# Patient Record
Sex: Female | Born: 1970 | Race: Black or African American | Hispanic: No | Marital: Single | Smoking: Never smoker
Health system: Southern US, Community
[De-identification: ages and names within clinical notes are randomized; demographics above are authoritative.]

## PROBLEM LIST (undated history)

## (undated) ENCOUNTER — Emergency Department (HOSPITAL_COMMUNITY): Payer: Self-pay

## (undated) ENCOUNTER — Emergency Department (HOSPITAL_BASED_OUTPATIENT_CLINIC_OR_DEPARTMENT_OTHER): Admission: EM | Payer: Medicare Other | Source: Home / Self Care

## (undated) DIAGNOSIS — Z86711 Personal history of pulmonary embolism: Secondary | ICD-10-CM

## (undated) DIAGNOSIS — E785 Hyperlipidemia, unspecified: Secondary | ICD-10-CM

## (undated) DIAGNOSIS — F209 Schizophrenia, unspecified: Secondary | ICD-10-CM

## (undated) DIAGNOSIS — E119 Type 2 diabetes mellitus without complications: Secondary | ICD-10-CM

## (undated) DIAGNOSIS — J45909 Unspecified asthma, uncomplicated: Secondary | ICD-10-CM

## (undated) DIAGNOSIS — K219 Gastro-esophageal reflux disease without esophagitis: Secondary | ICD-10-CM

## (undated) HISTORY — DX: Gastro-esophageal reflux disease without esophagitis: K21.9

## (undated) HISTORY — DX: Personal history of pulmonary embolism: Z86.711

## (undated) HISTORY — PX: BREAST SURGERY: SHX581

## (undated) HISTORY — DX: Hyperlipidemia, unspecified: E78.5

## (undated) HISTORY — DX: Unspecified asthma, uncomplicated: J45.909

---

## 1997-06-04 ENCOUNTER — Emergency Department (HOSPITAL_COMMUNITY): Admission: EM | Admit: 1997-06-04 | Discharge: 1997-06-04 | Payer: Self-pay | Admitting: Emergency Medicine

## 1998-08-08 ENCOUNTER — Inpatient Hospital Stay (HOSPITAL_COMMUNITY): Admission: EM | Admit: 1998-08-08 | Discharge: 1998-08-12 | Payer: Self-pay | Admitting: Emergency Medicine

## 1998-12-09 ENCOUNTER — Other Ambulatory Visit: Admission: RE | Admit: 1998-12-09 | Discharge: 1998-12-09 | Payer: Self-pay | Admitting: Family Medicine

## 1998-12-22 ENCOUNTER — Encounter: Payer: Self-pay | Admitting: Family Medicine

## 1998-12-22 ENCOUNTER — Ambulatory Visit (HOSPITAL_COMMUNITY): Admission: RE | Admit: 1998-12-22 | Discharge: 1998-12-22 | Payer: Self-pay | Admitting: Family Medicine

## 1999-01-17 ENCOUNTER — Emergency Department (HOSPITAL_COMMUNITY): Admission: EM | Admit: 1999-01-17 | Discharge: 1999-01-17 | Payer: Self-pay | Admitting: Emergency Medicine

## 1999-01-22 ENCOUNTER — Inpatient Hospital Stay (HOSPITAL_COMMUNITY): Admission: EM | Admit: 1999-01-22 | Discharge: 1999-01-29 | Payer: Self-pay | Admitting: Emergency Medicine

## 1999-01-22 ENCOUNTER — Encounter: Payer: Self-pay | Admitting: Emergency Medicine

## 1999-01-22 ENCOUNTER — Emergency Department (HOSPITAL_COMMUNITY): Admission: EM | Admit: 1999-01-22 | Discharge: 1999-01-22 | Payer: Self-pay | Admitting: Emergency Medicine

## 1999-01-24 ENCOUNTER — Encounter: Payer: Self-pay | Admitting: Family Medicine

## 1999-02-10 ENCOUNTER — Encounter: Admission: RE | Admit: 1999-02-10 | Discharge: 1999-05-11 | Payer: Self-pay | Admitting: Family Medicine

## 2000-01-05 ENCOUNTER — Ambulatory Visit (HOSPITAL_COMMUNITY): Admission: RE | Admit: 2000-01-05 | Discharge: 2000-01-05 | Payer: Self-pay

## 2000-10-12 ENCOUNTER — Other Ambulatory Visit: Admission: RE | Admit: 2000-10-12 | Discharge: 2000-10-12 | Payer: Self-pay | Admitting: Obstetrics and Gynecology

## 2001-11-11 ENCOUNTER — Encounter: Admission: RE | Admit: 2001-11-11 | Discharge: 2001-11-11 | Payer: Self-pay | Admitting: Obstetrics and Gynecology

## 2001-11-11 ENCOUNTER — Encounter: Payer: Self-pay | Admitting: Obstetrics and Gynecology

## 2001-12-11 ENCOUNTER — Ambulatory Visit (HOSPITAL_BASED_OUTPATIENT_CLINIC_OR_DEPARTMENT_OTHER): Admission: RE | Admit: 2001-12-11 | Discharge: 2001-12-11 | Payer: Self-pay | Admitting: General Surgery

## 2001-12-15 ENCOUNTER — Emergency Department (HOSPITAL_COMMUNITY): Admission: EM | Admit: 2001-12-15 | Discharge: 2001-12-15 | Payer: Self-pay | Admitting: Emergency Medicine

## 2002-07-15 ENCOUNTER — Other Ambulatory Visit: Admission: RE | Admit: 2002-07-15 | Discharge: 2002-07-15 | Payer: Self-pay | Admitting: Obstetrics and Gynecology

## 2003-03-24 ENCOUNTER — Encounter: Admission: RE | Admit: 2003-03-24 | Discharge: 2003-03-24 | Payer: Self-pay | Admitting: Internal Medicine

## 2003-07-08 ENCOUNTER — Ambulatory Visit (HOSPITAL_COMMUNITY): Admission: RE | Admit: 2003-07-08 | Discharge: 2003-07-08 | Payer: Self-pay | Admitting: General Surgery

## 2003-07-08 ENCOUNTER — Ambulatory Visit (HOSPITAL_BASED_OUTPATIENT_CLINIC_OR_DEPARTMENT_OTHER): Admission: RE | Admit: 2003-07-08 | Discharge: 2003-07-08 | Payer: Self-pay | Admitting: General Surgery

## 2003-08-03 ENCOUNTER — Other Ambulatory Visit: Admission: RE | Admit: 2003-08-03 | Discharge: 2003-08-03 | Payer: Self-pay | Admitting: Obstetrics and Gynecology

## 2003-09-02 ENCOUNTER — Inpatient Hospital Stay (HOSPITAL_COMMUNITY): Admission: AD | Admit: 2003-09-02 | Discharge: 2003-09-14 | Payer: Self-pay | Admitting: Psychiatry

## 2003-09-21 ENCOUNTER — Ambulatory Visit (HOSPITAL_COMMUNITY): Admission: RE | Admit: 2003-09-21 | Discharge: 2003-09-21 | Payer: Self-pay | Admitting: General Surgery

## 2003-09-21 ENCOUNTER — Ambulatory Visit (HOSPITAL_BASED_OUTPATIENT_CLINIC_OR_DEPARTMENT_OTHER): Admission: RE | Admit: 2003-09-21 | Discharge: 2003-09-21 | Payer: Self-pay | Admitting: General Surgery

## 2003-10-23 ENCOUNTER — Ambulatory Visit: Payer: Self-pay | Admitting: Psychiatry

## 2003-10-23 ENCOUNTER — Inpatient Hospital Stay (HOSPITAL_COMMUNITY): Admission: EM | Admit: 2003-10-23 | Discharge: 2003-10-29 | Payer: Self-pay | Admitting: Psychiatry

## 2003-11-05 ENCOUNTER — Emergency Department (HOSPITAL_COMMUNITY): Admission: EM | Admit: 2003-11-05 | Discharge: 2003-11-06 | Payer: Self-pay

## 2004-01-22 ENCOUNTER — Emergency Department (HOSPITAL_COMMUNITY): Admission: EM | Admit: 2004-01-22 | Discharge: 2004-01-22 | Payer: Self-pay | Admitting: Emergency Medicine

## 2008-08-14 ENCOUNTER — Emergency Department (HOSPITAL_COMMUNITY): Admission: EM | Admit: 2008-08-14 | Discharge: 2008-08-14 | Payer: Self-pay | Admitting: Family Medicine

## 2008-08-15 ENCOUNTER — Emergency Department (HOSPITAL_COMMUNITY): Admission: EM | Admit: 2008-08-15 | Discharge: 2008-08-15 | Payer: Self-pay | Admitting: Emergency Medicine

## 2008-10-12 ENCOUNTER — Emergency Department (HOSPITAL_COMMUNITY): Admission: EM | Admit: 2008-10-12 | Discharge: 2008-10-12 | Payer: Self-pay | Admitting: Family Medicine

## 2008-10-15 ENCOUNTER — Emergency Department (HOSPITAL_COMMUNITY): Admission: EM | Admit: 2008-10-15 | Discharge: 2008-10-15 | Payer: Self-pay | Admitting: Family Medicine

## 2008-12-04 ENCOUNTER — Emergency Department (HOSPITAL_COMMUNITY): Admission: EM | Admit: 2008-12-04 | Discharge: 2008-12-04 | Payer: Self-pay | Admitting: Family Medicine

## 2009-04-27 ENCOUNTER — Encounter: Admission: RE | Admit: 2009-04-27 | Discharge: 2009-07-26 | Payer: Self-pay | Admitting: Internal Medicine

## 2010-05-12 LAB — GLUCOSE, CAPILLARY: Glucose-Capillary: 227 mg/dL — ABNORMAL HIGH (ref 70–99)

## 2010-05-13 LAB — CULTURE, ROUTINE-ABSCESS

## 2010-06-24 NOTE — Op Note (Signed)
NAME:  REIZEL, CALZADA NO.:  [id number]   MEDICAL RECORD NO.:  [id number]                   PATIENT TYPE:  AMB   LOCATION:  DSC                                  FACILITY:  [facility]   PHYSICIAN:  Rose Phi. Maple Hudson, M.D.                DATE OF BIRTH:  07-09-1970   DATE OF PROCEDURE:  09/21/2003  DATE OF DISCHARGE:                                 OPERATIVE REPORT   PREOPERATIVE DIAGNOSIS:  Right breast abscess.   POSTOPERATIVE DIAGNOSIS:  Right breast abscess.   OPERATION:  Incision and drainage of same.   SURGEON:  Rose Phi. Maple Hudson, M.D.   ANESTHESIA:  General.   OPERATIVE PROCEDURE:  After suitable general anesthesia was induced, the  patient was placed in a supine position and the right breast prepped and  draped in usual fashion. A curved incision over the fluctuant part of the  upper part of the breast was then made, and we entered the abscess cavity  which had about three different pockets which I opened up. We drained all of  the pus and cultured it, aerobically and anaerobically. We then thoroughly  irrigated out with saline. I then packed it loosely with 2-inch Iodoform  gauze soaked in Betadine. Dressing applied. The patient transferred to  recovery room in satisfactory condition, having tolerated procedure well.                                               Rose Phi. Maple Hudson, M.D.    PRY/MEDQ  D:  09/21/2003  T:  09/21/2003  Job:  [id number]

## 2010-06-24 NOTE — Discharge Summary (Signed)
NAME:  Debbie Dorsey, Debbie Dorsey NO.:  [id number]   MEDICAL RECORD NO.:  [id number]          PATIENT TYPE:  IPS   LOCATION:  0400                          FACILITY:  [facility]   PHYSICIAN:  Geoffery Lyons, M.D.      DATE OF BIRTH:  07-30-1970   DATE OF ADMISSION:  10/23/2003  DATE OF DISCHARGE:  10/29/2003                                 DISCHARGE SUMMARY   CHIEF COMPLAINT AND PRESENT ILLNESS:  This was the second admission to [facility] for this 40 year old, single, African American  female.  History of schizophrenia, decompensated for the past two weeks  prior to this admission.  Paranoid, guarded, experiencing no auditory  hallucinations.  Reports decreased sleep, decreased appetite.  Dr. Gershon Mussel at  [facility] tried adjusting the patient's medications, but the symptoms  did not improve.  Some concern with how compliant she has been.   PAST PSYCHIATRIC HISTORY:  Second time [facility].  Admitted from  July 27 to September 14, 2003.  Followed at [facility].   ALCOHOL/DRUG HISTORY:  Denies the use or abuse of any substance.   PAST MEDICAL HISTORY:  1.  Insulin-dependent-diabetes mellitus.  2.  Asthma.   MEDICATIONS:  1.  Risperdal 2 mg twice a day and 2 at night.  2.  Zoloft 50 mg daily.  3.  Cogentin 0.5 two times a day.  4.  Ativan 1 mg in the morning, at noon and 2 mg at night.  5.  NPH insulin 26 units in the morning and 10 units at night.  6.  Albuterol inhaler two puffs every six hours as needed.   PHYSICAL EXAMINATION:  Performed and failed to show any acute findings.   LABORATORY WORKUP:  CBC:  White blood cells 5.9, hemoglobin 12.2.  Blood  chemistries within normal limits.  Liver profile within normal limits.   MENTAL STATUS EXAM:  Reveals an alert, cooperative female, appropriately  groomed and dressed.  Speech was normal rate, rhythm and tone.  Mood was  anxious and was paranoid.  Affect was congruent.  She  does report feeling  safe within the hospital.  Thought processes were noted to be tangential,  more relevant at the time of the evaluation.  Concentration and memory well  preserved.  Judgment and insight were fair.  Denies any suicidal, homicidal  ideations.  Endorsed auditory/visual hallucinations.  Cannot understand what  the voices were saying.   ADMISSION DIAGNOSES:   AXIS I:  Schizophrenia, undifferentiated type.   AXIS II:  No diagnosis.   AXIS III:  1.  Diabetes mellitus.  2.  Asthma.  3.  Status post breast abscess.   AXIS IV:  Moderate.   AXIS V:  Upon admission 25; highest Global Assessment of Functioning in the  last year 60.   COURSE IN HOSPITAL:  She was admitted and started in individual and group  psychotherapies.  She was maintained on Risperdal 1 mg in the morning and 2  at night, Cogentin 0.5 three times a day, Zoloft 50 mg daily, Ativan 1 mg  every  six hours as needed for anxiety, NPH insulin 26 units before breakfast  and 10 units before bed, albuterol inhaler two puffs every six hours, Ambien  10 at bedtime for sleep.  She was placed on Risperdal Consta 37.5 mg daily  to be administered every two weeks and she was given Diflucan 150 mg tabs  one a day.  She was placed on a sliding scale NPH insulin that was adjusted  accordingly.  Risperdal was recently increased to 1 mg twice a day and 2 at  night and she was given Seroquel 100 at night.  She endorsed that she was  ___________ at home.  Initially somewhat confused, asking the same questions  repetitively, but she claimed that she was not __________ of what happened,  very vague.  Endorsed that the grandfather thought that she was not doing  well.  She was asked to come here.  Mood was anxious.  Endorsed that she  wanted to leave, wanted the grandfather to be contacted as she was wanting  out.  Endorsed that she was going to take the medication without any  problems.  There was definitely a question of  compliance.  Initially very  reserved, very guarded, anxious, inappropriately smiling at times.  Somatic  complaints, itching.  By September the 20th she was a little bit more  organized, some delusional ideas regarding neighbors, but stated that she  could avoid interacting with them.  She claimed that she was going to comply  with medications.  She was wanting to go home.  By September the 21st she  was endorsing auditory hallucinations, hearing voices telling her to hurt  herself.  She was agreeable to increase the Risperdal, so it was increased  to 1 mg twice a day and 2 at night.  Continued to monitor diabetes.  On  September 22nd, she endorsed she was much better, denied any hallucinations,  endorsed that she was feeling better.  Had been telling staff that she was  ready to go home.  She said that she was going to ignore the neighbors and  be compliant with medications.   DISCHARGE DIAGNOSES:   AXIS I:  Schizoaffective disorder.   AXIS II:  No diagnosis.   AXIS III:  1.  Asthma.  2.  Insulin-dependent diabetes mellitus.   AXIS IV:  Moderate.   AXIS V:  Global Assessment of Functioning upon discharge 50.   DISCHARGE MEDICATIONS:  1.  Zoloft 50 mg daily.  2.  Cogentin 0.5 three times a day.  3.  Risperdal Consta 37.5 every 14 days, given on September 17th.  4.  Risperdal 2 mg one half twice a day and one at night.  5.  NPH insulin 28 units in the morning and 8 units in the afternoon.  6.  Seroquel 100 at bedtime.  7.  Albuterol inhaler as needed.  8.  Ambien 10 at bedtime for sleep.   FOLLOW UP:  With Dr. Lang Snow at [facility].     Farrel Gordon   IL/MEDQ  D:  11/24/2003  T:  11/25/2003  Job:  [id number]

## 2010-06-24 NOTE — H&P (Signed)
NAME:  Debbie Dorsey, Debbie Dorsey NO.:  [id number]   MEDICAL RECORD NO.:  [id number]                   PATIENT TYPE:  IPS   LOCATION:  0400                                 FACILITY:  [facility]   PHYSICIAN:  Jeanice Lim, M.D.              DATE OF BIRTH:  11-03-1970   DATE OF ADMISSION:  10/23/2003  DATE OF DISCHARGE:                         PSYCHIATRIC ADMISSION ASSESSMENT   IDENTIFYING INFORMATION:  This is a voluntary admission.  This is a 40-year-  old single African-American female.  The patient has a history for  schizophrenia.  She has been decompensating for the past 2 weeks.  She is  paranoid, guarded and experiencing auditory hallucinations.  She reports  decreased sleep and decreased appetite.  Dr. Hortencia Pilar at [facility] has tried adjusting the patient's medications on an  outpatient basis but the patient's symptoms have not improved.  There is  some concern that she is somewhat noncompliant.   PAST PSYCHIATRIC HISTORY:  Her first admission was here July 27 to September 14, 2003.   SOCIAL HISTORY:  She has finished high school.  She states she has never  worked.  She gets a check.  She apparently lives with her grandfather.   FAMILY HISTORY:  She denies anyone else having schizophrenia.   ALCOHOL AND DRUG ABUSE:  She denies any use.   PAST MEDICAL HISTORY:  Her primary care Jilliam Bellmore is Dr. Virgel Manifold.  He follows for  insulin-dependent diabetes mellitus as well as asthma.  She is also status  post an I&D of a right breast abscess.  She states Dr. Francina Ames did this  [facility], and she still has an open area.  It has not  totally healed yet.  I'm not sure exactly what day this was done.  I will  try to find out.   CURRENT MEDICATIONS:  She is prescribed Risperdal 2 mg b.i.d. and 2 mg at  h.s., Zoloft 50 mg daily, Cogentin 0.5 mg t.i.d., Lorazepam 1 mg in the  morning, at noon, and 2 mg at h.s., NPH insulin 26 units  in the a.m., 10  units at h.s. and albuterol inhaler, 2 puffs q.6h p.r.n.   ALLERGIES:  No known drug allergies.   POSITIVE PHYSICAL FINDINGS:  PHYSICAL EXAMINATION:  As already noted, she is  still having some drainage from an open area on the right upper quadrant of  her right breast.   MENTAL STATUS EXAM:  She is alert and oriented.  She is appropriately  groomed and dressed.  Her speech is a normal rate, rhythm and tone.  Her  mood is anxious, somewhat paranoid.  Her affect is congruent, although she  does report feeling safe within the hospital.  Thought processes were  noted to be tangential on admission.  They are more relevant at the time of  this exam.  Concentration and memory are fair, judgment  and insight are  fair, intelligence is average.  She denies suicidal or homicidal ideation.  She is still have auditory and visual hallucinations.  She cannot understand  what the voices are saying.   ADMISSION DIAGNOSES:   AXIS I:  Schizophrenia, undifferentiated type.   AXIS II:  Deferred.   AXIS III:  Diabetes mellitus, asthma, and status post breast abscess.   AXIS IV:  Moderate stressors, mostly noncompliance with medications.   AXIS V:  Global assessment of function is 25 on admission.   PLAN:  The plan is to admit for stabilization and safety, to reestablish  medications, to initiate Risperdal Consta, and effectively manage her  insulin-dependent diabetes mellitus.      MD/MEDQ  D:  10/24/2003  T:  10/25/2003  Job:  [id number]

## 2012-09-26 ENCOUNTER — Ambulatory Visit: Payer: Self-pay | Admitting: Gynecology

## 2012-10-29 ENCOUNTER — Ambulatory Visit: Payer: Self-pay | Admitting: Gynecology

## 2015-09-20 ENCOUNTER — Emergency Department (HOSPITAL_COMMUNITY)
Admission: EM | Admit: 2015-09-20 | Discharge: 2015-09-20 | Disposition: A | Discharge status: Home / Self Care | Payer: Medicare Other | Attending: Emergency Medicine | Admitting: Emergency Medicine

## 2015-09-20 ENCOUNTER — Encounter (HOSPITAL_COMMUNITY): Payer: Self-pay | Admitting: Emergency Medicine

## 2015-09-20 DIAGNOSIS — E119 Type 2 diabetes mellitus without complications: Secondary | ICD-10-CM | POA: Diagnosis not present

## 2015-09-20 DIAGNOSIS — R42 Dizziness and giddiness: Secondary | ICD-10-CM | POA: Diagnosis present

## 2015-09-20 DIAGNOSIS — R55 Syncope and collapse: Secondary | ICD-10-CM | POA: Diagnosis not present

## 2015-09-20 HISTORY — DX: Type 2 diabetes mellitus without complications: E11.9

## 2015-09-20 LAB — CBC
HEMATOCRIT: 32.9 % — AB (ref 36.0–46.0)
Hemoglobin: 10.9 g/dL — ABNORMAL LOW (ref 12.0–15.0)
MCH: 32.5 pg (ref 26.0–34.0)
MCHC: 33.1 g/dL (ref 30.0–36.0)
MCV: 98.2 fL (ref 78.0–100.0)
Platelets: 352 10*3/uL (ref 150–400)
RBC: 3.35 MIL/uL — ABNORMAL LOW (ref 3.87–5.11)
RDW: 13 % (ref 11.5–15.5)
WBC: 7.2 10*3/uL (ref 4.0–10.5)

## 2015-09-20 LAB — I-STAT BETA HCG BLOOD, ED (MC, WL, AP ONLY): I-stat hCG, quantitative: <5 m[IU]/mL (ref <5)

## 2015-09-20 LAB — BASIC METABOLIC PANEL
Anion gap: 6 (ref 5–15)
BUN: 14 mg/dL (ref 6–20)
CHLORIDE: 108 mmol/L (ref 101–111)
CO2: 23 mmol/L (ref 22–32)
Calcium: 9.1 mg/dL (ref 8.9–10.3)
Creatinine, Ser: 0.88 mg/dL (ref 0.44–1.00)
GFR calc Af Amer: >60 mL/min (ref >60)
GFR calc non Af Amer: >60 mL/min (ref >60)
GLUCOSE: 203 mg/dL — AB (ref 65–99)
POTASSIUM: 3.9 mmol/L (ref 3.5–5.1)
Sodium: 137 mmol/L (ref 135–145)

## 2015-09-20 LAB — CBG MONITORING, ED: Glucose-Capillary: 206 mg/dL — ABNORMAL HIGH (ref 65–99)

## 2015-09-20 MED ORDER — SODIUM CHLORIDE 0.9 % IV BOLUS (SEPSIS)
1000.0000 mL | Freq: Once | INTRAVENOUS | Status: AC
  Administered 2015-09-20: 1000 mL via INTRAVENOUS

## 2015-09-20 NOTE — ED Provider Notes (Signed)
[facility] DEPT Provider Note   CSN: [id number] Arrival date & time: 09/20/15  1010     History   Chief Complaint Chief Complaint  Patient presents with  . Dizziness    HPI Debbie Dorsey is a 45 y.o. female.  45 yo F with a chief complaint of syncopal event. This happened after she suddenly stood up. Denies any chest pain shortness breath headache prior to the event. Has had an episode like this previously which she was diagnosed with hypoglycemia. Patient states that she did eat something afterwards and feels much better. Denies nausea vomiting or diarrhea. Denies significant vaginal bleeding.   The history is provided by the patient and a parent.  Dizziness  Associated symptoms: syncope   Associated symptoms: no chest pain, no headaches, no nausea, no palpitations, no shortness of breath and no vomiting   Loss of Consciousness   This is a recurrent problem. The current episode started 1 to 2 hours ago. The problem occurs rarely. The problem has been resolved. She lost consciousness for a period of less than one minute. The problem is associated with standing up. Associated symptoms include dizziness. Pertinent negatives include chest pain, congestion, fever, headaches, nausea, palpitations and vomiting. She has tried nothing for the symptoms. The treatment provided no relief.    Past Medical History:  Diagnosis Date  . Diabetes mellitus without complication (Page)     There are no active problems to display for this patient.   History reviewed. No pertinent surgical history.  OB History    Gravida Para Term Preterm AB Living   1             SAB TAB Ectopic Multiple Live Births                   Home Medications    Prior to Admission medications   Not on File    Family History No family history on file.  Social History Social History  Substance Use Topics  . Smoking status: Never Smoker  . Smokeless tobacco: Not on file  . Alcohol use No      Allergies   Review of patient's allergies indicates no known allergies.   Review of Systems Review of Systems  Constitutional: Negative for chills and fever.  HENT: Negative for congestion and rhinorrhea.   Eyes: Negative for redness and visual disturbance.  Respiratory: Negative for shortness of breath and wheezing.   Cardiovascular: Positive for syncope. Negative for chest pain and palpitations.  Gastrointestinal: Negative for nausea and vomiting.  Genitourinary: Negative for dysuria and urgency.  Musculoskeletal: Negative for arthralgias and myalgias.  Skin: Negative for pallor and wound.  Neurological: Positive for dizziness and syncope. Negative for headaches.     Physical Exam Updated Vital Signs BP 114/70 (BP Location: Left Arm)   Pulse 84   Temp 97.7 F (36.5 C) (Oral)   Resp 17   Ht 5\' 4"  (1.626 m)   Wt 110 lb (49.9 kg)   LMP 09/07/2015   SpO2 97%   BMI 18.88 kg/m   Physical Exam  Constitutional: She is oriented to person, place, and time. She appears well-developed and well-nourished. No distress.  HENT:  Head: Normocephalic and atraumatic.  Eyes: EOM are normal. Pupils are equal, round, and reactive to light.  Neck: Normal range of motion. Neck supple.  Cardiovascular: Normal rate and regular rhythm.  Exam reveals no gallop and no friction rub.   No murmur heard. Pulmonary/Chest: Effort normal. She  has no wheezes. She has no rales.  Abdominal: Soft. She exhibits no distension. There is no tenderness.  Musculoskeletal: She exhibits no edema or tenderness.  Neurological: She is alert and oriented to person, place, and time.  Skin: Skin is warm and dry. She is not diaphoretic.  Psychiatric: She has a normal mood and affect. Her behavior is normal.  Nursing note and vitals reviewed.    ED Treatments / Results  Labs (all labs ordered are listed, but only abnormal results are displayed) Labs Reviewed  BASIC METABOLIC PANEL - Abnormal; Notable for  the following:       Result Value   Glucose, Bld 203 (*)    All other components within normal limits  CBC - Abnormal; Notable for the following:    RBC 3.35 (*)    Hemoglobin 10.9 (*)    HCT 32.9 (*)    All other components within normal limits  CBG MONITORING, ED - Abnormal; Notable for the following:    Glucose-Capillary 206 (*)    All other components within normal limits  I-STAT BETA HCG BLOOD, ED ([facility], [facility], [facility] ONLY)    EKG  EKG Interpretation  Date/Time:  Monday September 20 2015 10:26:16 EDT Ventricular Rate:  104 PR Interval:    QRS Duration: 94 QT Interval:  344 QTC Calculation: 453 R Axis:   62 Text Interpretation:  Sinus tachycardia Low voltage, extremity and precordial leads Baseline wander in lead(s) II III aVF no prolonged qt, wpw or brugada No significant change since last tracing Confirmed by Meleena Munroe MD, Quillian Quince [id number]) on 09/20/2015 10:36:38 AM       Radiology No results found.  Procedures Procedures (including critical care time)  Medications Ordered in ED Medications  sodium chloride 0.9 % bolus 1,000 mL (0 mLs Intravenous Stopped 09/20/15 1236)     Initial Impression / Assessment and Plan / ED Course  I have reviewed the triage vital signs and the nursing notes.  Pertinent labs & imaging results that were available during my care of the patient were reviewed by me and considered in my medical decision making (see chart for details).  Clinical Course    45 yo F With a chief complaint of a syncopal episode. Sounds vasovagal based on history. Patient is back to baseline now. There is some possibility that this was hypoglycemia that the patient has eaten and was observed in the ED for a couple hours without recurrence. EKG and metabolic panel CBC unremarkable. Discharge home.  3:42 PM:  I have discussed the diagnosis/risks/treatment options with the patient and family and believe the pt to be eligible for discharge home to follow-up with PCP. We also  discussed returning to the ED immediately if new or worsening sx occur. We discussed the sx which are most concerning (e.g., sudden worsening pain, fever, inability to tolerate by mouth) that necessitate immediate return. Medications administered to the patient during their visit and any new prescriptions provided to the patient are listed below.  Medications given during this visit Medications  sodium chloride 0.9 % bolus 1,000 mL (0 mLs Intravenous Stopped 09/20/15 1236)     The patient appears reasonably screen and/or stabilized for discharge and I doubt any other medical condition or other [facility] requiring further screening, evaluation, or treatment in the ED at this time prior to discharge.    Final Clinical Impressions(s) / ED Diagnoses   Final diagnoses:  Near syncope    New Prescriptions There are no discharge medications for this patient.  Deno Etienne, DO 09/20/15 1542

## 2015-09-20 NOTE — ED Triage Notes (Signed)
Pt complaint of dizziness onset 0730 without associated symptoms. Pt denies numbness, tingling, or weakness. Pt only reported hx of DM II.

## 2016-11-14 ENCOUNTER — Encounter (HOSPITAL_COMMUNITY): Payer: Self-pay | Admitting: Emergency Medicine

## 2016-11-14 ENCOUNTER — Ambulatory Visit (HOSPITAL_COMMUNITY)
Admission: EM | Admit: 2016-11-14 | Discharge: 2016-11-14 | Disposition: A | Discharge status: Home / Self Care | Payer: Medicare Other | Attending: Family Medicine | Admitting: Family Medicine

## 2016-11-14 DIAGNOSIS — E119 Type 2 diabetes mellitus without complications: Secondary | ICD-10-CM | POA: Diagnosis not present

## 2016-11-14 DIAGNOSIS — G44209 Tension-type headache, unspecified, not intractable: Secondary | ICD-10-CM

## 2016-11-14 LAB — GLUCOSE, CAPILLARY: Glucose-Capillary: 224 mg/dL — ABNORMAL HIGH (ref 65–99)

## 2016-11-14 MED ORDER — NAPROXEN 500 MG PO TBEC: 500.0000 mg | DELAYED_RELEASE_TABLET | Freq: Two times a day (BID) | ORAL | 0 refills | Status: DC

## 2016-11-14 NOTE — Discharge Instructions (Signed)
Heat (pad or rice pillow in microwave) over affected area, 10-15 minutes every 2-3 hours while awake.  OK to use Tylenol with medicine that has been called in.  EXERCISES RANGE OF MOTION (ROM) AND STRETCHING EXERCISES  These exercises may help you when beginning to rehabilitate your issue. In order to successfully resolve your symptoms, you must improve your posture. These exercises are designed to help reduce the forward-head and rounded-shoulder posture which contributes to this condition. Your symptoms may resolve with or without further involvement from your physician, physical therapist or athletic trainer. While completing these exercises, remember:  Restoring tissue flexibility helps normal motion to return to the joints. This allows healthier, less painful movement and activity. An effective stretch should be held for at least 20 seconds, although you may need to begin with shorter hold times for comfort. A stretch should never be painful. You should only feel a gentle lengthening or release in the stretched tissue. Do not do any stretch or exercise that you cannot tolerate.  STRETCH- Axial Extensors Lie on your back on the floor. You may bend your knees for comfort. Place a rolled-up hand towel or dish towel, about 2 inches in diameter, under the part of your head that makes contact with the floor. Gently tuck your chin, as if trying to make a "double chin," until you feel a gentle stretch at the base of your head. Hold 15-20 seconds. Repeat 2-3 times. Complete this exercise 1 time per day.   STRETCH - Axial Extension  Stand or sit on a firm surface. Assume a good posture: chest up, shoulders drawn back, abdominal muscles slightly tense, knees unlocked (if standing) and feet hip width apart. Slowly retract your chin so your head slides back and your chin slightly lowers. Continue to look straight ahead. You should feel a gentle stretch in the back of your head. Be certain not to feel an  aggressive stretch since this can cause headaches later. Hold for 15-20 seconds. Repeat 2-3 times. Complete this exercise 1 time per day.  STRETCH - Cervical Side Bend  Stand or sit on a firm surface. Assume a good posture: chest up, shoulders drawn back, abdominal muscles slightly tense, knees unlocked (if standing) and feet hip width apart. Without letting your nose or shoulders move, slowly tip your right / left ear to your shoulder until your feel a gentle stretch in the muscles on the opposite side of your neck. Hold 15-20 seconds. Repeat 2-3 times. Complete this exercise 1-2 times per day.  STRETCH - Cervical Rotators  Stand or sit on a firm surface. Assume a good posture: chest up, shoulders drawn back, abdominal muscles slightly tense, knees unlocked (if standing) and feet hip width apart. Keeping your eyes level with the ground, slowly turn your head until you feel a gentle stretch along the back and opposite side of your neck. Hold 15-20 seconds. Repeat 2-3 times. Complete this exercise 1-2 times per day.  RANGE OF MOTION - Neck Circles  Stand or sit on a firm surface. Assume a good posture: chest up, shoulders drawn back, abdominal muscles slightly tense, knees unlocked (if standing) and feet hip width apart. Gently roll your head down and around from the back of one shoulder to the back of the other. The motion should never be forced or painful. Repeat the motion 10-20 times, or until you feel the neck muscles relax and loosen. Repeat 2-3 times. Complete the exercise 1-2 times per day. STRENGTHENING EXERCISES - Cervical Strain  and Sprain These exercises may help you when beginning to rehabilitate your injury. They may resolve your symptoms with or without further involvement from your physician, physical therapist, or athletic trainer. While completing these exercises, remember:  Muscles can gain both the endurance and the strength needed for everyday activities through controlled  exercises. Complete these exercises as instructed by your physician, physical therapist, or athletic trainer. Progress the resistance and repetitions only as guided. You may experience muscle soreness or fatigue, but the pain or discomfort you are trying to eliminate should never worsen during these exercises. If this pain does worsen, stop and make certain you are following the directions exactly. If the pain is still present after adjustments, discontinue the exercise until you can discuss the trouble with your clinician.  STRENGTH - Cervical Flexors, Isometric Face a wall, standing about 6 inches away. Place a small pillow, a ball about 6-8 inches in diameter, or a folded towel between your forehead and the wall. Slightly tuck your chin and gently push your forehead into the soft object. Push only with mild to moderate intensity, building up tension gradually. Keep your jaw and forehead relaxed. Hold 10 to 20 seconds. Keep your breathing relaxed. Release the tension slowly. Relax your neck muscles completely before you start the next repetition. Repeat 2-3 times. Complete this exercise 1 time per day.  STRENGTH- Cervical Lateral Flexors, Isometric  Stand about 6 inches away from a wall. Place a small pillow, a ball about 6-8 inches in diameter, or a folded towel between the side of your head and the wall. Slightly tuck your chin and gently tilt your head into the soft object. Push only with mild to moderate intensity, building up tension gradually. Keep your jaw and forehead relaxed. Hold 10 to 20 seconds. Keep your breathing relaxed. Release the tension slowly. Relax your neck muscles completely before you start the next repetition. Repeat 2-3 times. Complete this exercise 1 time per day.  STRENGTH - Cervical Extensors, Isometric  Stand about 6 inches away from a wall. Place a small pillow, a ball about 6-8 inches in diameter, or a folded towel between the back of your head and the  wall. Slightly tuck your chin and gently tilt your head back into the soft object. Push only with mild to moderate intensity, building up tension gradually. Keep your jaw and forehead relaxed. Hold 10 to 20 seconds. Keep your breathing relaxed. Release the tension slowly. Relax your neck muscles completely before you start the next repetition. Repeat 2-3 times. Complete this exercise 1 time per day.  POSTURE AND BODY MECHANICS CONSIDERATIONS Keeping correct posture when sitting, standing or completing your activities will reduce the stress put on different body tissues, allowing injured tissues a chance to heal and limiting painful experiences. The following are general guidelines for improved posture. Your physician or physical therapist will provide you with any instructions specific to your needs. While reading these guidelines, remember: The exercises prescribed by your provider will help you have the flexibility and strength to maintain correct postures. The correct posture provides the optimal environment for your joints to work. All of your joints have less wear and tear when properly supported by a spine with good posture. This means you will experience a healthier, less painful body. Correct posture must be practiced with all of your activities, especially prolonged sitting and standing. Correct posture is as important when doing repetitive low-stress activities (typing) as it is when doing a single heavy-load activity (lifting).  PROLONGED  STANDING WHILE SLIGHTLY LEANING FORWARD When completing a task that requires you to lean forward while standing in one place for a long time, place either foot up on a stationary 2- to 4-inch high object to help maintain the best posture. When both feet are on the ground, the low back tends to lose its slight inward curve. If this curve flattens (or becomes too large), then the back and your other joints will experience too much stress, fatigue more quickly,  and can cause pain.   RESTING POSITIONS Consider which positions are most painful for you when choosing a resting position. If you have pain with flexion-based activities (sitting, bending, stooping, squatting), choose a position that allows you to rest in a less flexed posture. You would want to avoid curling into a fetal position on your side. If your pain worsens with extension-based activities (prolonged standing, working overhead), avoid resting in an extended position such as sleeping on your stomach. Most people will find more comfort when they rest with their spine in a more neutral position, neither too rounded nor too arched. Lying on a non-sagging bed on your side with a pillow between your knees, or on your back with a pillow under your knees will often provide some relief. Keep in mind, being in any one position for a prolonged period of time, no matter how correct your posture, can still lead to stiffness.  WALKING Walk with an upright posture. Your ears, shoulders, and hips should all line up. OFFICE WORK When working at a desk, create an environment that supports good, upright posture. Without extra support, muscles fatigue and lead to excessive strain on joints and other tissues.  CHAIR: A chair should be able to slide under your desk when your back makes contact with the back of the chair. This allows you to work closely. The chair's height should allow your eyes to be level with the upper part of your monitor and your hands to be slightly lower than your elbows. Body position: Your feet should make contact with the floor. If this is not possible, use a foot rest. Keep your ears over your shoulders. This will reduce stress on your neck and low back.

## 2016-11-14 NOTE — ED Triage Notes (Signed)
PT reports headache for a few days. PT has taken medicine for menstrual cramps at home.   PT reports history of migraines

## 2016-11-14 NOTE — ED Provider Notes (Signed)
[facility]    CSN: [id number] Arrival date & time: 11/14/16  1136     History   Chief Complaint Chief Complaint  Patient presents with  . Headache    HPI Debbie Dorsey is a 46 y.o. female. Here with grandpa who helps with history.   HPI 3-4 days of b/l HA over (points to) occipital region. She says she has hx of migraines, grandpa corrects her. Tylenol at home, some relief. She does not chew gum. Some neck pain. No vision changes, numbness, tingling, weakness, dysphagia, dysphasia.  Past Medical History:  Diagnosis Date  . Diabetes mellitus without complication ([place])    History reviewed. No pertinent surgical history.  OB History    Gravida Para Term Preterm AB Living   1             SAB TAB Ectopic Multiple Live Births                   Home Medications    Prior to Admission medications   Medication Sig Start Date End Date Taking? Authorizing Provider  risperiDONE microspheres (RISPERDAL CONSTA) 25 MG injection Inject 37.5 mg into the muscle every 14 (fourteen) days.   Yes [provider]  simvastatin (ZOCOR) 10 MG tablet Take 10 mg by mouth daily.   Yes [provider]  naproxen (EC NAPROSYN) 500 MG EC tablet Take 1 tablet (500 mg total) by mouth 2 (two) times daily with a meal. 11/14/16   Wendling, Crosby Oyster, DO    Family History Non-contributing.  Social History Social History  Substance Use Topics  . Smoking status: Never Smoker  . Smokeless tobacco: Never Used  . Alcohol use No     Allergies   Patient has no known allergies.   Review of Systems Review of Systems  Musculoskeletal: Positive for neck pain.  Neurological: Positive for headaches. Negative for speech difficulty, weakness and numbness.     Physical Exam Triage Vital Signs ED Triage Vitals  Enc Vitals Group     BP 11/14/16 1205 103/71     Pulse Rate 11/14/16 1205 (!) 129     Resp 11/14/16 1205 16     Temp 11/14/16 1205 98.3 F (36.8 C)    Temp Source 11/14/16 1205 Oral     SpO2 11/14/16 1205 96 %     Weight 11/14/16 1206 105 lb (47.6 kg)     Height 11/14/16 1206 5\' 3"  (1.6 m)     Pain Score 11/14/16 1208 7   Updated Vital Signs BP 103/71 (BP Location: Right Arm)   Pulse (!) 129   Temp 98.3 F (36.8 C) (Oral)   Resp 16   Ht 5\' 3"  (1.6 m)   Wt 105 lb (47.6 kg)   SpO2 96%   BMI 18.60 kg/m   Physical Exam  Constitutional: She appears well-developed and well-nourished.  HENT:  Head: Normocephalic and atraumatic.  Nose: Nose normal.  Mouth/Throat: Oropharynx is clear and moist.  Eyes: Pupils are equal, round, and reactive to light. EOM are normal.  Neck: Normal range of motion.  Musculoskeletal:  +TTP over subocc triangle and cerv paraspinal msc Difficult to test strength 2/2 pt comprehension  Neurological: She is alert. She displays normal reflexes. She exhibits normal muscle tone. Coordination normal.  Skin: Skin is warm. She is not diaphoretic.  Psychiatric: She has a normal mood and affect.  Limited judgment and insight     UC Treatments / Results  Labs (all  labs ordered are listed, but only abnormal results are displayed) Labs Reviewed  GLUCOSE, CAPILLARY - Abnormal; Notable for the following:       Result Value   Glucose-Capillary 224 (*)    All other components within normal limits   Procedures Procedures none   Initial Impression / Assessment and Plan / UC Course  I have reviewed the triage vital signs and the nursing notes.  Pertinent labs & imaging results that were available during my care of the patient were reviewed by me and considered in my medical decision making (see chart for details).     Pt presents with classic tension type headache. Offered injection of anti-inflammatory but pt did not want. Will rec NSAIDs and Tylenol, heat. Stretches and exercises for neck given also. F/u with PCP if symptoms fail to improve. Pt and grandpa voiced understanding and agreement to the  plan.  Final Clinical Impressions(s) / UC Diagnoses   Final diagnoses:  Tension headache    New Prescriptions Discharge Medication List as of 11/14/2016 12:53 PM    START taking these medications   Details  naproxen (EC NAPROSYN) 500 MG EC tablet Take 1 tablet (500 mg total) by mouth 2 (two) times daily with a meal., Starting Tue 11/14/2016, Normal         Controlled Substance Prescriptions [place] Controlled Substance Registry consulted? Not Applicable   Shelda Pal, [place] 11/14/16 1301

## 2018-04-02 ENCOUNTER — Other Ambulatory Visit: Payer: Self-pay

## 2018-04-02 ENCOUNTER — Other Ambulatory Visit: Payer: Self-pay | Admitting: Registered Nurse

## 2018-04-02 ENCOUNTER — Encounter: Payer: Self-pay | Admitting: Emergency Medicine

## 2018-04-02 ENCOUNTER — Inpatient Hospital Stay (HOSPITAL_COMMUNITY)
Admission: AD | Admit: 2018-04-02 | Discharge: 2018-04-06 | DRG: 885 | Disposition: A | Discharge status: Other Acute Inpatient Hospital | Payer: Medicare Other | Source: Intra-hospital | Attending: Psychiatry | Admitting: Psychiatry

## 2018-04-02 ENCOUNTER — Emergency Department (HOSPITAL_COMMUNITY)
Admission: EM | Admit: 2018-04-02 | Discharge: 2018-04-02 | Disposition: A | Discharge status: Other Acute Inpatient Hospital | Payer: Medicare Other | Source: Home / Self Care | Attending: Emergency Medicine | Admitting: Emergency Medicine

## 2018-04-02 ENCOUNTER — Encounter (HOSPITAL_COMMUNITY): Payer: Self-pay

## 2018-04-02 DIAGNOSIS — E11649 Type 2 diabetes mellitus with hypoglycemia without coma: Secondary | ICD-10-CM | POA: Diagnosis present

## 2018-04-02 DIAGNOSIS — G4700 Insomnia, unspecified: Secondary | ICD-10-CM | POA: Diagnosis present

## 2018-04-02 DIAGNOSIS — I2699 Other pulmonary embolism without acute cor pulmonale: Secondary | ICD-10-CM | POA: Diagnosis present

## 2018-04-02 DIAGNOSIS — E119 Type 2 diabetes mellitus without complications: Secondary | ICD-10-CM

## 2018-04-02 DIAGNOSIS — F200 Paranoid schizophrenia: Secondary | ICD-10-CM | POA: Diagnosis present

## 2018-04-02 DIAGNOSIS — Z791 Long term (current) use of non-steroidal anti-inflammatories (NSAID): Secondary | ICD-10-CM

## 2018-04-02 DIAGNOSIS — Z794 Long term (current) use of insulin: Secondary | ICD-10-CM

## 2018-04-02 DIAGNOSIS — F23 Brief psychotic disorder: Secondary | ICD-10-CM | POA: Diagnosis not present

## 2018-04-02 DIAGNOSIS — R440 Auditory hallucinations: Secondary | ICD-10-CM | POA: Diagnosis not present

## 2018-04-02 DIAGNOSIS — Z79899 Other long term (current) drug therapy: Secondary | ICD-10-CM

## 2018-04-02 DIAGNOSIS — F209 Schizophrenia, unspecified: Secondary | ICD-10-CM | POA: Insufficient documentation

## 2018-04-02 DIAGNOSIS — R000 Tachycardia, unspecified: Secondary | ICD-10-CM | POA: Diagnosis present

## 2018-04-02 DIAGNOSIS — Z818 Family history of other mental and behavioral disorders: Secondary | ICD-10-CM | POA: Diagnosis not present

## 2018-04-02 DIAGNOSIS — I2694 Multiple subsegmental pulmonary emboli without acute cor pulmonale: Secondary | ICD-10-CM

## 2018-04-02 DIAGNOSIS — R45851 Suicidal ideations: Secondary | ICD-10-CM | POA: Diagnosis present

## 2018-04-02 DIAGNOSIS — Z62810 Personal history of physical and sexual abuse in childhood: Secondary | ICD-10-CM | POA: Diagnosis present

## 2018-04-02 DIAGNOSIS — R441 Visual hallucinations: Secondary | ICD-10-CM | POA: Diagnosis not present

## 2018-04-02 DIAGNOSIS — E1165 Type 2 diabetes mellitus with hyperglycemia: Secondary | ICD-10-CM | POA: Diagnosis not present

## 2018-04-02 LAB — COMPREHENSIVE METABOLIC PANEL
ALK PHOS: 55 U/L (ref 38–126)
ALT: 26 U/L (ref 0–44)
AST: 30 U/L (ref 15–41)
Albumin: 4.1 g/dL (ref 3.5–5.0)
Anion gap: 12 (ref 5–15)
BILIRUBIN TOTAL: 0.7 mg/dL (ref 0.3–1.2)
BUN: 14 mg/dL (ref 6–20)
CO2: 20 mmol/L — ABNORMAL LOW (ref 22–32)
CREATININE: 1.09 mg/dL — AB (ref 0.44–1.00)
Calcium: 9.6 mg/dL (ref 8.9–10.3)
Chloride: 108 mmol/L (ref 98–111)
GFR calc Af Amer: >60 mL/min (ref >60)
Glucose, Bld: 308 mg/dL — ABNORMAL HIGH (ref 70–99)
Potassium: 4 mmol/L (ref 3.5–5.1)
Sodium: 140 mmol/L (ref 135–145)
TOTAL PROTEIN: 7.9 g/dL (ref 6.5–8.1)

## 2018-04-02 LAB — ETHANOL

## 2018-04-02 LAB — CBC
HEMATOCRIT: 39.8 % (ref 36.0–46.0)
HEMOGLOBIN: 12.7 g/dL (ref 12.0–15.0)
MCH: 31.5 pg (ref 26.0–34.0)
MCHC: 31.9 g/dL (ref 30.0–36.0)
MCV: 98.8 fL (ref 80.0–100.0)
Platelets: 423 10*3/uL — ABNORMAL HIGH (ref 150–400)
RBC: 4.03 MIL/uL (ref 3.87–5.11)
RDW: 12.4 % (ref 11.5–15.5)
WBC: 7 10*3/uL (ref 4.0–10.5)
nRBC: 0 % (ref 0.0–0.2)

## 2018-04-02 LAB — I-STAT BETA HCG BLOOD, ED (MC, WL, AP ONLY)

## 2018-04-02 LAB — RAPID URINE DRUG SCREEN, HOSP PERFORMED
Amphetamines: NOT DETECTED
BARBITURATES: NOT DETECTED
BENZODIAZEPINES: NOT DETECTED
Cocaine: NOT DETECTED
Opiates: NOT DETECTED
TETRAHYDROCANNABINOL: NOT DETECTED

## 2018-04-02 LAB — ACETAMINOPHEN LEVEL: Acetaminophen (Tylenol), Serum: <10 ug/mL — ABNORMAL LOW (ref 10–30)

## 2018-04-02 LAB — GLUCOSE, CAPILLARY: Glucose-Capillary: 254 mg/dL — ABNORMAL HIGH (ref 70–99)

## 2018-04-02 LAB — CBG MONITORING, ED: GLUCOSE-CAPILLARY: 174 mg/dL — AB (ref 70–99)

## 2018-04-02 LAB — SALICYLATE LEVEL: Salicylate Lvl: <7 mg/dL (ref 2.8–30.0)

## 2018-04-02 MED ORDER — ZIPRASIDONE MESYLATE 20 MG IM SOLR: 20.0000 mg | INTRAMUSCULAR | Status: DC | PRN

## 2018-04-02 MED ORDER — BENZTROPINE MESYLATE 1 MG/ML IJ SOLN: 1.0000 mg | Freq: Every day | INTRAMUSCULAR | Status: DC

## 2018-04-02 MED ORDER — LORAZEPAM 2 MG/ML IJ SOLN
1.0000 mg | Freq: Once | INTRAMUSCULAR | Status: AC
  Administered 2018-04-02: 1 mg via INTRAVENOUS
  Filled 2018-04-02: qty 1

## 2018-04-02 MED ORDER — LORAZEPAM 1 MG PO TABS: 1.0000 mg | ORAL_TABLET | Freq: Four times a day (QID) | ORAL | Status: DC | PRN

## 2018-04-02 MED ORDER — SODIUM CHLORIDE 0.9 % IV BOLUS
1000.0000 mL | Freq: Once | INTRAVENOUS | Status: AC
  Administered 2018-04-02: 1000 mL via INTRAVENOUS

## 2018-04-02 MED ORDER — LORAZEPAM 1 MG PO TABS
1.0000 mg | ORAL_TABLET | ORAL | Status: AC | PRN
  Administered 2018-04-02: 1 mg via ORAL
  Filled 2018-04-02: qty 1

## 2018-04-02 MED ORDER — INSULIN ASPART 100 UNIT/ML ~~LOC~~ SOLN
0.0000 [IU] | Freq: Three times a day (TID) | SUBCUTANEOUS | Status: DC
  Administered 2018-04-03: 5 [IU] via SUBCUTANEOUS
  Administered 2018-04-03: 15 [IU] via SUBCUTANEOUS
  Administered 2018-04-04: 8 [IU] via SUBCUTANEOUS
  Administered 2018-04-04: 15 [IU] via SUBCUTANEOUS
  Administered 2018-04-04: 8 [IU] via SUBCUTANEOUS
  Administered 2018-04-05 (×2): 3 [IU] via SUBCUTANEOUS
  Administered 2018-04-05: 2 [IU] via SUBCUTANEOUS
  Administered 2018-04-06: 5 [IU] via SUBCUTANEOUS
  Administered 2018-04-06: 2 [IU] via SUBCUTANEOUS
  Filled 2018-04-02: qty 1

## 2018-04-02 MED ORDER — INSULIN ASPART 100 UNIT/ML ~~LOC~~ SOLN
0.0000 [IU] | Freq: Every day | SUBCUTANEOUS | Status: DC
  Administered 2018-04-02: 3 [IU] via SUBCUTANEOUS
  Administered 2018-04-03: 4 [IU] via SUBCUTANEOUS
  Administered 2018-04-04 – 2018-04-05 (×2): 2 [IU] via SUBCUTANEOUS

## 2018-04-02 MED ORDER — OLANZAPINE 10 MG PO TBDP
10.0000 mg | ORAL_TABLET | Freq: Three times a day (TID) | ORAL | Status: DC | PRN
  Administered 2018-04-02: 10 mg via ORAL

## 2018-04-02 MED ORDER — SIMVASTATIN 20 MG PO TABS
40.0000 mg | ORAL_TABLET | Freq: Every day | ORAL | Status: DC
  Administered 2018-04-02: 40 mg via ORAL
  Filled 2018-04-02: qty 2

## 2018-04-02 MED ORDER — SODIUM CHLORIDE 0.9 % IV SOLN: INTRAVENOUS | Status: DC

## 2018-04-02 MED ORDER — RISPERIDONE 1 MG PO TABS: 1.0000 mg | ORAL_TABLET | Freq: Every day | ORAL | Status: DC

## 2018-04-02 MED ORDER — INSULIN ASPART PROT & ASPART (70-30 MIX) 100 UNIT/ML ~~LOC~~ SUSP: 32.0000 [IU] | Freq: Two times a day (BID) | SUBCUTANEOUS | Status: DC

## 2018-04-02 NOTE — ED Notes (Signed)
Pt pacing around room. Scared that the devil is coming after her. Pt reports the devil will hit her and she is scared she will make her jump in front of traffic.

## 2018-04-02 NOTE — Discharge Instructions (Signed)
Go to psych now.

## 2018-04-02 NOTE — ED Notes (Signed)
Pt's grandfather took all of pt's belongings home.
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
## 2018-04-02 NOTE — ED Provider Notes (Signed)
[facility] EMERGENCY DEPARTMENT Provider Note   CSN: [id number] Arrival date & time: 04/02/18  2992    History   Chief Complaint Chief Complaint  Patient presents with  . Hallucinations    HPI Debbie Dorsey is a 48 y.o. female.     Patient brought in by family member for evaluation of worsening schizophrenia with paranoia and hallucinations.  Patient is been taking her rest per do not daily no missed doses.  Apparently was seen at [facility] yesterday and they did adjust her meds.  He feels that things have been getting worse for the past 3 to 4 days he does not feel that he is that she is getting any better.  There is no concern for suicidal ideation no direct concern for any overdose.  Patient does not have a history of alcohol problems.  Patient was out in triage and the psychiatric medical clearance orders were done.  Did note that patient's heart rate out in triage was 156.  There is an EKG that was done 2 hours prior to bring back which showed a sinus tach with a heart rate of 155.  Patient does appear a little bit anxious.  Heart rate is definitely elevated.  Patient will follow commands.  Some verbal.  Patient seems to admit to hallucinations.  No tremors.     Past Medical History:  Diagnosis Date  . Diabetes mellitus without complication ([place])     There are no active problems to display for this patient.   History reviewed. No pertinent surgical history.   OB History    Gravida  1   Para      Term      Preterm      AB      Living        SAB      TAB      Ectopic      Multiple      Live Births               Home Medications    Prior to Admission medications   Medication Sig Start Date End Date Taking? Authorizing Provider  benztropine (COGENTIN) 1 MG tablet Take 1 mg by mouth at bedtime. 03/13/18  Yes [provider]  HUMALOG MIX 75/25 KWIKPEN (75-25) 100 UNIT/ML Kwikpen Inject 32-34 Units into the skin See  admin instructions. Take 32 units in the evening and 34 units in the morning 01/21/18  Yes [provider]  risperiDONE (RISPERDAL) 1 MG tablet Take 1 mg by mouth at bedtime. 04/01/18  Yes [provider]  simvastatin (ZOCOR) 40 MG tablet Take 40 mg by mouth daily at 12 noon.    Yes [provider]  naproxen (EC NAPROSYN) 500 MG EC tablet Take 1 tablet (500 mg total) by mouth 2 (two) times daily with a meal. Patient not taking: Reported on 04/02/2018 11/14/16   Shelda Pal, DO  risperiDONE microspheres (RISPERDAL CONSTA) 25 MG injection Inject 37.5 mg into the muscle every 14 (fourteen) days.    [provider]    Family History History reviewed. No pertinent family history.  Social History Social History   Tobacco Use  . Smoking status: Never Smoker  . Smokeless tobacco: Never Used  Substance Use Topics  . Alcohol use: No  . Drug use: No     Allergies   Patient has no known allergies.   Review of Systems Review of Systems  Constitutional: Negative  for chills and fever.  HENT: Negative for rhinorrhea and sore throat.   Eyes: Negative for visual disturbance.  Respiratory: Negative for cough and shortness of breath.   Cardiovascular: Negative for chest pain and leg swelling.  Gastrointestinal: Negative for abdominal pain, diarrhea, nausea and vomiting.  Genitourinary: Negative for dysuria.  Musculoskeletal: Negative for back pain and neck pain.  Skin: Negative for rash.  Neurological: Negative for dizziness, light-headedness and headaches.  Hematological: Does not bruise/bleed easily.  Psychiatric/Behavioral: Positive for confusion and hallucinations. Negative for agitation, self-injury and suicidal ideas. The patient is nervous/anxious.      Physical Exam Updated Vital Signs BP 92/71   Pulse (!) 106   Temp 98.8 F (37.1 C) (Oral)   Resp 17   SpO2 97%   Breastfeeding Unknown   Physical Exam Vitals signs and nursing note  reviewed.  Constitutional:      General: She is not in acute distress.    Appearance: Normal appearance. She is well-developed.  HENT:     Head: Normocephalic and atraumatic.     Nose: No congestion.     Mouth/Throat:     Mouth: Mucous membranes are moist.  Eyes:     Extraocular Movements: Extraocular movements intact.     Conjunctiva/sclera: Conjunctivae normal.     Pupils: Pupils are equal, round, and reactive to light.  Neck:     Musculoskeletal: Normal range of motion and neck supple.  Cardiovascular:     Rate and Rhythm: Regular rhythm. Tachycardia present.     Heart sounds: No murmur.  Pulmonary:     Effort: Pulmonary effort is normal. No respiratory distress.     Breath sounds: Normal breath sounds.  Abdominal:     General: Bowel sounds are normal.     Palpations: Abdomen is soft.     Tenderness: There is no abdominal tenderness.  Musculoskeletal: Normal range of motion.        General: No swelling.  Skin:    General: Skin is warm and dry.     Capillary Refill: Capillary refill takes less than 2 seconds.  Neurological:     General: No focal deficit present.     Mental Status: She is alert.     Motor: No weakness.     Coordination: Coordination normal.      ED Treatments / Results  Labs (all labs ordered are listed, but only abnormal results are displayed) Labs Reviewed  COMPREHENSIVE METABOLIC PANEL - Abnormal; Notable for the following components:      Result Value   CO2 20 (*)    Glucose, Bld 308 (*)    Creatinine, Ser 1.09 (*)    All other components within normal limits  ACETAMINOPHEN LEVEL - Abnormal; Notable for the following components:   Acetaminophen (Tylenol), Serum <10 (*)    All other components within normal limits  CBC - Abnormal; Notable for the following components:   Platelets 423 (*)    All other components within normal limits  CBG MONITORING, ED - Abnormal; Notable for the following components:   Glucose-Capillary 174 (*)    All  other components within normal limits  ETHANOL  SALICYLATE LEVEL  RAPID URINE DRUG SCREEN, HOSP PERFORMED  I-STAT BETA HCG BLOOD, ED ([facility], [facility], [facility] ONLY)    EKG EKG Interpretation  Date/Time:  Tuesday April 02 2018 09:19:33 EST Ventricular Rate:  155 PR Interval:  120 QRS Duration: 56 QT Interval:  300 QTC Calculation: 482 R Axis:   24 Text Interpretation:  Sinus tachycardia Otherwise normal ECG Confirmed by Fredia Sorrow [phone]) on 04/02/2018 11:58:50 AM   Radiology No results found.  Procedures Procedures (including critical care time)  Medications Ordered in ED Medications  0.9 %  sodium chloride infusion ( Intravenous Not Given 04/02/18 1329)  sodium chloride 0.9 % bolus 1,000 mL (0 mLs Intravenous Stopped 04/02/18 1410)  LORazepam (ATIVAN) injection 1 mg (1 mg Intravenous Given 04/02/18 1326)     Initial Impression / Assessment and Plan / ED Course  I have reviewed the triage vital signs and the nursing notes.  Pertinent labs & imaging results that were available during my care of the patient were reviewed by me and considered in my medical decision making (see chart for details).        Patient already been evaluated by psychiatric service and they recommended inpatient evaluation.  But I still needed to clear her medically.  Her heart rate when she was hooked up to the cardiac monitor back here was in the 138 range.  Patient's blood sugar out front on initial labs was elevated.  But repeat brought it down significantly.  Patient also received 1 mg of Ativan and brought her heart rate down to which is slightly over 100.  I think patient is medically clear for psychiatric admission as per their recommendation.  Labs here without any significant abnormalities.  Other than the elevated blood sugar.  Urine drug screen was negative Tylenol was not elevated ethanol was not elevated.  I do not think patient's tachycardia is related to any kind of alcohol withdrawal.   Based on the history she does not consume alcohol.  Feel that is probably due to agitation secondary to her schizophrenia.  Patient medically cleared and awaiting placement.  Final Clinical Impressions(s) / ED Diagnoses   Final diagnoses:  Schizophrenia, unspecified type [facility])    ED Discharge Orders    None       Fredia Sorrow, MD 04/02/18 1625

## 2018-04-02 NOTE — ED Provider Notes (Signed)
I received the patient in signout from Dr. Rogene Houston.  Briefly the patient arrived here for mental health evaluation and was found to be significantly tachycardic.  Dr. Rogene Houston has evaluated the patient and medically cleared her.  She is currently awaiting psych transfer.   Deno Etienne, DO 04/02/18 [phone]

## 2018-04-02 NOTE — ED Notes (Signed)
Pt placed in purple scrubs. 

## 2018-04-02 NOTE — ED Notes (Signed)
RN informed Pt can receive visitor  

## 2018-04-02 NOTE — ED Notes (Signed)
[facility] here to take patient to [facility].  

## 2018-04-02 NOTE — Progress Notes (Signed)
Pt accepted to [facility]; room 508-1 Shuvon Rankin, NP is the accepting provider.   Dr. Sheppard Evens is the attending provider.   Call report to [phone]   RN @ [facility] ED notified.    Pt is voluntary and can be transported by [facility].  Pt may be transported as soon as transportation can be arranged.   Audree Camel, LCSW, [place] Disposition [facility]/TTS [phone]

## 2018-04-02 NOTE — ED Notes (Signed)
Grandfather states he will be leaving the ED and returning in about 1 hour. Marland Kitchen [phone]

## 2018-04-02 NOTE — ED Notes (Signed)
Grandfather aware that patient will be going to [facility]

## 2018-04-02 NOTE — Progress Notes (Signed)
Patient presents with paranoid/tangential/worried affect and  behavior during admission interview and assessment. VS monitored and recorded. Skin check performed with Legrand Como MHT and revealed skin intact. Contraband was not found. Patient was oriented to unit and schedule. Pt states "Big Otila Kluver is out to get me. She gonna kill me. She's the devil. She could be behind me right now. She killed my dogs now she's coming for me". Pt denies SI/HI/AVH at this time. PO fluids provided. Safety maintained. Rest encouraged.

## 2018-04-02 NOTE — ED Notes (Signed)
Called [facility] to arrange transport for patient to [facility]

## 2018-04-02 NOTE — ED Triage Notes (Signed)
Pt presents for evaluation of worsening schizophrenia with paranoia and hallucinations. Pt reports taking her risperidone daily with no missed doses. Grandfather accompanies patient and reports symptoms have been worse x 3-4 days. Pt repeats "I dont trust her" over and over.

## 2018-04-02 NOTE — Progress Notes (Signed)
1:1 Progress Note Pt places on 1:1 due to inappropriate physical contact and severe paranoia. Pt observed inappropriately holds hands, grabs hair, touches arms, and gives hugs to patients and staff. Pt paranoid and refuses to be alone. Pt requires constant redirection.  D: Pt currently lying in bed. Patient appropriate to situation. Pt in no current distress.  A: Sitter is currently sitting at bedside. R: Pt remains safe on a 1:1 per MD orders.

## 2018-04-03 DIAGNOSIS — F200 Paranoid schizophrenia: Secondary | ICD-10-CM

## 2018-04-03 LAB — GLUCOSE, CAPILLARY
Glucose-Capillary: 122 mg/dL — ABNORMAL HIGH (ref 70–99)
Glucose-Capillary: 478 mg/dL — ABNORMAL HIGH (ref 70–99)
Glucose-Capillary: 285 mg/dL — ABNORMAL HIGH (ref 70–99)
Glucose-Capillary: 212 mg/dL — ABNORMAL HIGH (ref 70–99)
Glucose-Capillary: 325 mg/dL — ABNORMAL HIGH (ref 70–99)

## 2018-04-03 MED ORDER — CLONAZEPAM 0.5 MG PO TABS
0.2500 mg | ORAL_TABLET | Freq: Three times a day (TID) | ORAL | Status: DC
  Administered 2018-04-03 – 2018-04-05 (×5): 0.25 mg via ORAL
  Filled 2018-04-03 (×4): qty 1

## 2018-04-03 MED ORDER — RISPERIDONE 2 MG PO TABS
2.0000 mg | ORAL_TABLET | Freq: Two times a day (BID) | ORAL | Status: DC
  Administered 2018-04-03 – 2018-04-06 (×7): 2 mg via ORAL
  Filled 2018-04-03 (×10): qty 1

## 2018-04-03 MED ORDER — TEMAZEPAM 15 MG PO CAPS: 15.0000 mg | ORAL_CAPSULE | Freq: Every evening | ORAL | Status: DC | PRN

## 2018-04-03 MED ORDER — ENSURE ENLIVE PO LIQD
237.0000 mL | Freq: Two times a day (BID) | ORAL | Status: DC
  Administered 2018-04-03 – 2018-04-05 (×5): 237 mL via ORAL

## 2018-04-03 MED ORDER — BENZTROPINE MESYLATE 0.5 MG PO TABS
0.5000 mg | ORAL_TABLET | Freq: Two times a day (BID) | ORAL | Status: DC
  Administered 2018-04-03 – 2018-04-06 (×8): 0.5 mg via ORAL
  Filled 2018-04-03 (×11): qty 1

## 2018-04-03 NOTE — [facility] Counselor (Signed)
CSW made attempt to speak with patient for psychosocial assessment. Patient was nonresponsive.  Lawana Pai, MSW Intern 04/03/2018 1:30 PM

## 2018-04-03 NOTE — Progress Notes (Signed)
Recreation Therapy Notes  Date: 2.26.20 Time: 1000 Location: 500 Hall Dayroom  Group Topic: Wellness  Goal Area(s) Addresses:  Patient will define components of whole wellness. Patient will verbalize benefit of whole wellness.  Behavioral Response:  Engaged  Intervention:  Music   Activity:  Exercise.  LRT led patients in a series of stretches.  Each patient was given the opportunity to lead the group in an exercise of their choice.  Each patient was allowed to take water breaks as needed.  Patients were also encouraged to pay attention to any pains or sore areas of their body.  Education: Wellness, Dentist.   Education Outcome: Acknowledges education/In group clarification offered/Needs additional education.   Clinical Observations/Feedback:  Pt needed redirection to complete exercises.  Pt was able to concentrate when redirected to.  Pt was pleasant and bright.    Victorino Sparrow, LRT/CTRS    Victorino Sparrow A 04/03/2018 11:34 AM

## 2018-04-03 NOTE — Progress Notes (Signed)
1:1 Progress Note D: Pt currently asleep in bed. Patient appropriate to situation. Pt in no current distress.  A: Sitter is currently at bedside. R: Pt remains safe on a 1:1 per MD orders.   

## 2018-04-03 NOTE — Tx Team (Signed)
Interdisciplinary Treatment and Diagnostic Plan Update  04/03/2018 Time of Session: [facility] MRN: [id number]  Principal Diagnosis: <principal problem not specified>  Secondary Diagnoses: Active Problems:   Schizophrenia ([place])   Current Medications:  Current Facility-Administered Medications  Medication Dose Route Frequency Provider Last Rate Last Dose  . benztropine (COGENTIN) tablet 0.5 mg  0.5 mg Oral BID Johnn Hai, MD   0.5 mg at 04/03/18 1024  . clonazePAM (KLONOPIN) tablet 0.25 mg  0.25 mg Oral TID Johnn Hai, MD   0.25 mg at 04/03/18 1025  . feeding supplement (ENSURE ENLIVE) (ENSURE ENLIVE) liquid 237 mL  237 mL Oral BID BM Johnn Hai, MD   237 mL at 04/03/18 1003  . insulin aspart (novoLOG) injection 0-15 Units  0-15 Units Subcutaneous TID WC Simon, Spencer E, PA-C      . insulin aspart (novoLOG) injection 0-5 Units  0-5 Units Subcutaneous QHS Laverle Hobby, PA-C   3 Units at 04/02/18 2142  . OLANZapine zydis (ZYPREXA) disintegrating tablet 10 mg  10 mg Oral Q8H PRN Laverle Hobby, PA-C   10 mg at 04/02/18 2140   And  . ziprasidone (GEODON) injection 20 mg  20 mg Intramuscular PRN Laverle Hobby, PA-C      . risperiDONE (RISPERDAL) tablet 2 mg  2 mg Oral BID Johnn Hai, MD   2 mg at 04/03/18 1024  . temazepam (RESTORIL) capsule 15 mg  15 mg Oral QHS PRN Johnn Hai, MD       PTA Medications: Medications Prior to Admission  Medication Sig Dispense Refill Last Dose  . benztropine (COGENTIN) 1 MG tablet Take 1 mg by mouth at bedtime.   04/01/2018 at Unknown time  . HUMALOG MIX 75/25 KWIKPEN (75-25) 100 UNIT/ML Kwikpen Inject 32-34 Units into the skin See admin instructions. Take 32 units in the evening and 34 units in the morning   04/02/2018 at Unknown time  . naproxen (EC NAPROSYN) 500 MG EC tablet Take 1 tablet (500 mg total) by mouth 2 (two) times daily with a meal. (Patient not taking: Reported on 04/02/2018) 30 tablet 0 Not Taking at Unknown time   . risperiDONE (RISPERDAL) 1 MG tablet Take 1 mg by mouth at bedtime.   04/01/2018 at Unknown time  . risperiDONE microspheres (RISPERDAL CONSTA) 25 MG injection Inject 37.5 mg into the muscle every 14 (fourteen) days.   03/26/2018  . simvastatin (ZOCOR) 40 MG tablet Take 40 mg by mouth daily at 12 noon.    04/01/2018 at Unknown time    Patient Stressors:    Patient Strengths:    Treatment Modalities: Medication Management, Group therapy, Case management,  1 to 1 session with clinician, Psychoeducation, Recreational therapy.   Physician Treatment Plan for Primary Diagnosis: <principal problem not specified> Long Term Goal(s): Improvement in symptoms so as ready for discharge Improvement in symptoms so as ready for discharge   Short Term Goals: Ability to disclose and discuss suicidal ideas Ability to demonstrate self-control will improve Compliance with prescribed medications will improve  Medication Management: Evaluate patient's response, side effects, and tolerance of medication regimen.  Therapeutic Interventions: 1 to 1 sessions, Unit Group sessions and Medication administration.  Evaluation of Outcomes: Not Met  Physician Treatment Plan for Secondary Diagnosis: Active Problems:   Schizophrenia ([place])  Long Term Goal(s): Improvement in symptoms so as ready for discharge Improvement in symptoms so as ready for discharge   Short Term Goals: Ability to disclose and discuss suicidal ideas Ability to  demonstrate self-control will improve Compliance with prescribed medications will improve     Medication Management: Evaluate patient's response, side effects, and tolerance of medication regimen.  Therapeutic Interventions: 1 to 1 sessions, Unit Group sessions and Medication administration.  Evaluation of Outcomes: Not Met   RN Treatment Plan for Primary Diagnosis: <principal problem not specified> Long Term Goal(s): Knowledge of disease and therapeutic regimen to maintain  health will improve  Short Term Goals: Ability to identify and develop effective coping behaviors will improve and Compliance with prescribed medications will improve  Medication Management: RN will administer medications as ordered by provider, will assess and evaluate patient's response and provide education to patient for prescribed medication. RN will report any adverse and/or side effects to prescribing provider.  Therapeutic Interventions: 1 on 1 counseling sessions, Psychoeducation, Medication administration, Evaluate responses to treatment, Monitor vital signs and CBGs as ordered, Perform/monitor CIWA, COWS, AIMS and Fall Risk screenings as ordered, Perform wound care treatments as ordered.  Evaluation of Outcomes: Not Met   LCSW Treatment Plan for Primary Diagnosis: <principal problem not specified> Long Term Goal(s): Safe transition to appropriate next level of care at discharge, Engage patient in therapeutic group addressing interpersonal concerns.  Short Term Goals: Engage patient in aftercare planning with referrals and resources, Increase social support and Increase skills for wellness and recovery  Therapeutic Interventions: Assess for all discharge needs, 1 to 1 time with Social worker, Explore available resources and support systems, Assess for adequacy in community support network, Educate family and significant other(s) on suicide prevention, Complete Psychosocial Assessment, Interpersonal group therapy.  Evaluation of Outcomes: Not Met   Progress in Treatment: Attending groups: No. Participating in groups: No. Taking medication as prescribed: Yes. Toleration medication: Yes. Family/Significant other contact made: No, will contact:  when guardianship verified Patient understands diagnosis: No. Discussing patient identified problems/goals with staff: Yes. Medical problems stabilized or resolved: Yes. Denies suicidal/homicidal ideation: Yes. Issues/concerns per patient  self-inventory: No. Other: none  New problem(s) identified: No, Describe:  none  New Short Term/Long Term Goal(s):  Patient Goals:  Pt unable to state goal.  Discharge Plan or Barriers:   Reason for Continuation of Hospitalization: Depression Hallucinations Medication stabilization  Estimated Length of Stay:3-5 days.  Attendees: Patient:Debbie Dorsey 04/03/2018   Physician: Dr. Jake Samples, MD 04/03/2018   Nursing: Neldon Newport, RN 04/03/2018   RN Care Manager: 04/03/2018   Social Worker: Lurline Idol, LCSW 04/03/2018   Recreational Therapist:  04/03/2018   Other:  04/03/2018   Other:  04/03/2018  Other: 04/03/2018     Scribe for Treatment Team: Joanne Chars, LCSW 04/03/2018 11:14 AM

## 2018-04-03 NOTE — Progress Notes (Signed)
Recreation Therapy Notes  INPATIENT RECREATION THERAPY ASSESSMENT  Patient Details Name: Debbie Dorsey MRN: [id number] DOB: September 28, 1970 Today's Date: 04/03/2018       Information Obtained From: Patient  Able to Participate in Assessment/Interview: Yes  Patient Presentation: Confused  Reason for Admission (Per Patient): Other (Comments)(Pt stated someone named Debbie Dorsey was doing things to her.)  Patient Stressors: Other (Comment)(Pt stated she talks to herself and someone makes her have sex with their son)  Coping Skills:   Isolation, Self-Injury, Sports, TV, Arguments, Aggression, Music, Exercise, Substance Abuse, Impulsivity, Dorsey, Prayer, Avoidance, Intrusive Behavior, Read, Hot Bath/Shower  Leisure Interests (2+):  Individual - TV, Petra Kuba - Other (Comment), Community - Travel (Comment)(Play with dog)  Frequency of Recreation/Participation: (Watch tv and play with dog- Daily; Travel- Never)  Awareness of Community Resources:  No  Expressed Interest in [employer] Information: No  County of Residence:  [place]  Patient Main Form of Transportation: Car  Patient Strengths:  Good person; Never had a problem with anybody  Patient Identified Areas of Improvement:  Running my mouth to people; Cousin used to make her mother cry  Patient Goal for Hospitalization:  "Get better"  Current SI (including self-harm):  Yes(Rated a 10; Contracts)  Current HI:  No  Current AVH: Yes(Hearing a voice say "I'mma get you" and seeing someone named "Debbie Dorsey")  Staff Intervention Plan: Group Attendance, Collaborate with Interdisciplinary Treatment Team  Consent to Intern Participation: N/A     Victorino Sparrow, LRT/CTRS  Victorino Sparrow A 04/03/2018, 12:23 PM

## 2018-04-03 NOTE — Progress Notes (Signed)
1:1 Progress Note D: Pt currently asleep in bed. Patient appropriate to situation. Pt in no current distress.  A: Sitter is currently sitting at bedside. R: Pt remains safe on a 1:1 per MD orders.

## 2018-04-03 NOTE — Progress Notes (Addendum)
Inpatient Diabetes Program Recommendations  AACE/ADA: New Consensus Statement on Inpatient Glycemic Control (2015)  Target Ranges:  Prepandial:   less than 140 mg/dL      Peak postprandial:   less than 180 mg/dL (1-2 hours)      Critically ill patients:  140 - 180 mg/dL   Lab Results  Component Value Date   XKPVVZ 482 (H) 04/03/2018    Review of Glycemic Control  Diabetes history: DM Outpatient Diabetes medications: Humalog 75/25 insulin mix 34 units am + 32 units pm Current orders for Inpatient glycemic control: Novolog moderate correction tid + hs 0-5 units  Inpatient Diabetes Program Recommendations:   Patient probable type 1 diabetes and will need portion of basal insulin. Spoke with RN Legrand Como Scarce and discussed need for additional basal insulin. States patient is eating very well and also had a nutritional supplement of ensure. Noon CBG post lunch was 478 and received 15 units Novolog correction. Requested repeat CBG. -Add 80% home regimen -Novolog 70/30 27 units bid ac breakfast and dinner if patient is eating well.  Thank you, Nani Gasser. Pricella Gaugh, RN, MSN, CDE  Diabetes Coordinator Inpatient Glycemic Control Team Team Pager [phone] (8am-5pm) 04/03/2018 1:59 PM

## 2018-04-03 NOTE — Progress Notes (Signed)
CSW spoke to pt grandfather, Veronda Prude, [phone].  Mr. Veronda Prude confirms that he is pt legal guardian but that he has not yet brought over the guardianship paper.  He is planning to come visit pt tonight and will bring it with him.  CSW asked him to give paper to the RN so I can get it tomorrow.   Winferd Humphrey, MSW, LCSW Clinical Social Worker 04/03/2018 1:07 PM

## 2018-04-03 NOTE — Progress Notes (Signed)
1:1 note  Pt has been viewed in the dayroom most of the morning. Pt is still extremely paranoid that someone is coming to get her. Pt reassured that she is safe here. Pt denies any pain. Pt is pleasant and redirectable. Pt's blood sugar was checked and found to be critical. Physician was consulted and instructed this writer to give her the 15 standing and then recheck her BS. Pt fell asleep after lunch though. This Probation officer spoke with the diabetes coordinator and the pt's blood sugar was rechecked. And found to be 258. Pt still asleep. Pt's sitter is within arms reach. q61m safety checks implemented and continued. Will continue to monitor. Pt safe on the unit.

## 2018-04-03 NOTE — Progress Notes (Signed)
1:1 note  Pt found in bed; allowed to rest. Upon awakening, pt was disoriented, confused, but redirectable. Pt was assisted to the shower by her MHT. Pt had no complaints, but was very tangential in her speech. Pt was fidgety and guarded in her assessment. Pt denies si/hi/ah/vh and verbally agrees to approach staff if these become apparent or before harming herself/others while at [facility]. Pt safe on the unit. 1;1 sitter within arms reach. q64m safety checks implemented and continued. 1:1 continues.
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
## 2018-04-03 NOTE — Progress Notes (Signed)
1:1 note  Pt has been asleep until visitation when her grandfather came to visit her. Pt's BS was checked and found to be 212. Pt denies any symptoms but was provided dinner. Pt denies si/hi/ah/vh and verbally agrees to approach staff if these become apparent or before harming herself or others while at [facility]. Pt is still fixated on being safe here. Pt safe on the unit. q2m safety checks implemented and continued. 1:1 sitter within arms reach. 1:1 continues.
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
## 2018-04-03 NOTE — Progress Notes (Signed)
Did not attend group 

## 2018-04-04 LAB — GLUCOSE, CAPILLARY
Glucose-Capillary: 285 mg/dL — ABNORMAL HIGH (ref 70–99)
Glucose-Capillary: 383 mg/dL — ABNORMAL HIGH (ref 70–99)
Glucose-Capillary: 277 mg/dL — ABNORMAL HIGH (ref 70–99)
Glucose-Capillary: 234 mg/dL — ABNORMAL HIGH (ref 70–99)

## 2018-04-04 LAB — TSH: TSH: 1.072 u[IU]/mL (ref 0.350–4.500)

## 2018-04-04 MED ORDER — INSULIN ASPART 100 UNIT/ML ~~LOC~~ SOLN
27.0000 [IU] | Freq: Two times a day (BID) | SUBCUTANEOUS | Status: DC
  Administered 2018-04-04 – 2018-04-05 (×2): 27 [IU] via SUBCUTANEOUS

## 2018-04-04 MED ORDER — PRENATAL MULTIVITAMIN CH
1.0000 | ORAL_TABLET | Freq: Every day | ORAL | Status: DC
  Administered 2018-04-04 – 2018-04-05 (×2): 1 via ORAL
  Filled 2018-04-04 (×3): qty 1

## 2018-04-04 NOTE — Plan of Care (Signed)
D: Patient is in bed asleep on approach. Patient did walk to med window when prompted. Patient endorses SI and AVH. Patient is confused and sedated. Did verbally contract for safety. Remains on 1:1 observation for safety due to confusion and paranoia. Patient denies physical symptoms/pain.    A: Scheduled medications administered per MD order. Support provided. Patient educated on safety on the unit and medications. Routine safety checks every 15 minutes and 1:1 observation. Patient stated understanding to tell nurse about any new physical symptoms. Patient understands to tell staff of any needs.     R: No adverse drug reactions noted. Patient verbally contracts for safety. Patient remains safe at this time and will continue to monitor.   Problem: Education: Goal: Will be free of psychotic symptoms Outcome: Not Progressing   Patient endorses SI and AVH. Patient is confused and sedated. Patient did verbally contract for safety. Patient remains on 1:1 observation for safety due to confusion and paranoia.

## 2018-04-04 NOTE — Progress Notes (Signed)
1:1 Progress Note D: Pt currently asleep in bed. Patient appropriate to situation. Pt in no current distress.  A: Sitter is currently sitting at bedside. R: Pt remains safe on a 1:1 per MD orders.

## 2018-04-04 NOTE — BHH Suicide Risk Assessment (Signed)
[place] INPATIENT:  Family/Significant Other Suicide Prevention Education  Suicide Prevention Education:  Education Completed; Veronda Prude, grandfather, [phone], has been identified by the patient as the family member/significant other with whom the patient will be residing, and identified as the person(s) who will aid the patient in the event of a mental health crisis (suicidal ideations/suicide attempt).  With written consent from the patient, the family member/significant other has been provided the following suicide prevention education, prior to the and/or following the discharge of the patient.  The suicide prevention education provided includes the following:  Suicide risk factors  Suicide prevention and interventions  National Suicide Hotline telephone number  [facility] assessment telephone number  [facility] Emergency Assistance [place] and/or Residential Mobile Crisis Unit telephone number  Request made of family/significant other to:  Remove weapons (e.g., guns, rifles, knives), all items previously/currently identified as safety concern.  No guns in the home.   Remove drugs/medications (over-the-counter, prescriptions, illicit drugs), all items previously/currently identified as a safety concern.  The family member/significant other verbalizes understanding of the suicide prevention education information provided.  The family member/significant other agrees to remove the items of safety concern listed above.  Grandfather noticed pt mental health going downhill 3-4 days ago.  He took her to [facility], sat there all day without seeing someone, was redirected to crisis center and finally took her to [facility].  Pt has been compliant with medications.  Joanne Chars, LCSW 04/04/2018, 11:34 AM

## 2018-04-04 NOTE — Progress Notes (Signed)
[facility] MD Progress Note  04/04/2018 8:11 AM COVA KNIERIEM  MRN:  [id number] Subjective:    Patient is in bed she is alert and oriented to person and general situation that she is in a healthcare setting but believes she is at the [facility] facility, she woke yesterday with some paranoia and had to be reassured, at the present time she is alert as mentioned oriented generally and denies wanting to harm anyone else but states that she does indeed have suicidal thinking when asked but does not have plans or intent. No acute auditory or visual hallucinations Despite her young age, she may not only have schizophrenia but may also have a component of alcohol-related dementia as alcoholism was a past diagnosis but were not sure of the extent of it at any rate she is showing signs more of dementia to me that acute psychosis  Principal Problem: Confusion/psychosis/underlying psychotic disorder, rule out some component of neuro cognitive disorder Diagnosis: Active Problems:   Schizophrenia ([place])  Total Time spent with patient: 30 minutes  Past Medical History:  Past Medical History:  Diagnosis Date  . Diabetes mellitus without complication ([place])    History reviewed. No pertinent surgical history. Family History: History reviewed. No pertinent family history. Family Psychiatric  History: ukn Social History:  Social History   Substance and Sexual Activity  Alcohol Use No     Social History   Substance and Sexual Activity  Drug Use No    Social History   Socioeconomic History  . Marital status: Single    Spouse name: Not on file  . Number of children: Not on file  . Years of education: Not on file  . Highest education level: Not on file  Occupational History  . Not on file  Social Needs  . Financial resource strain: Not on file  . Food insecurity:    Worry: Not on file    Inability: Not on file  . Transportation needs:    Medical: Not on file    Non-medical: Not on file  Tobacco  Use  . Smoking status: Never Smoker  . Smokeless tobacco: Never Used  Substance and Sexual Activity  . Alcohol use: No  . Drug use: No  . Sexual activity: Not on file  Lifestyle  . Physical activity:    Days per week: Not on file    Minutes per session: Not on file  . Stress: Not on file  Relationships  . Social connections:    Talks on phone: Not on file    Gets together: Not on file    Attends religious service: Not on file    Active member of club or organization: Not on file    Attends meetings of clubs or organizations: Not on file    Relationship status: Not on file  Other Topics Concern  . Not on file  Social History Narrative  . Not on file   Sleep: Fair  Appetite:  Fair  Current Medications: Current Facility-Administered Medications  Medication Dose Route Frequency Provider Last Rate Last Dose  . benztropine (COGENTIN) tablet 0.5 mg  0.5 mg Oral BID Johnn Hai, MD   0.5 mg at 04/03/18 1847  . clonazePAM (KLONOPIN) tablet 0.25 mg  0.25 mg Oral TID Johnn Hai, MD   0.25 mg at 04/03/18 1025  . feeding supplement (ENSURE ENLIVE) (ENSURE ENLIVE) liquid 237 mL  237 mL Oral BID BM Johnn Hai, MD   237 mL at 04/03/18 1003  . insulin aspart (  novoLOG) injection 0-15 Units  0-15 Units Subcutaneous TID WC Patriciaann Clan E, PA-C   8 Units at 04/04/18 9147  . insulin aspart (novoLOG) injection 0-5 Units  0-5 Units Subcutaneous QHS Laverle Hobby, PA-C   4 Units at 04/03/18 2152  . OLANZapine zydis (ZYPREXA) disintegrating tablet 10 mg  10 mg Oral Q8H PRN Laverle Hobby, PA-C   10 mg at 04/02/18 2140   And  . ziprasidone (GEODON) injection 20 mg  20 mg Intramuscular PRN Laverle Hobby, PA-C      . prenatal multivitamin tablet 1 tablet  1 tablet Oral Q1200 Johnn Hai, MD      . risperiDONE (RISPERDAL) tablet 2 mg  2 mg Oral BID Johnn Hai, MD   2 mg at 04/03/18 1847  . temazepam (RESTORIL) capsule 15 mg  15 mg Oral QHS PRN Johnn Hai, MD        Lab Results:   Results for orders placed or performed during the hospital encounter of 04/02/18 (from the past 48 hour(s))  Glucose, capillary     Status: Abnormal   Collection Time: 04/02/18  8:38 PM  Result Value Ref Range   Glucose-Capillary 254 (H) 70 - 99 mg/dL  Glucose, capillary     Status: Abnormal   Collection Time: 04/03/18  6:51 AM  Result Value Ref Range   Glucose-Capillary 122 (H) 70 - 99 mg/dL  Glucose, capillary     Status: Abnormal   Collection Time: 04/03/18 11:58 AM  Result Value Ref Range   Glucose-Capillary 478 (H) 70 - 99 mg/dL  Glucose, capillary     Status: Abnormal   Collection Time: 04/03/18  2:45 PM  Result Value Ref Range   Glucose-Capillary 285 (H) 70 - 99 mg/dL  Glucose, capillary     Status: Abnormal   Collection Time: 04/03/18  6:39 PM  Result Value Ref Range   Glucose-Capillary 212 (H) 70 - 99 mg/dL   Comment 1 Notify RN    Comment 2 Document in Chart   Glucose, capillary     Status: Abnormal   Collection Time: 04/03/18  8:37 PM  Result Value Ref Range   Glucose-Capillary 325 (H) 70 - 99 mg/dL  Glucose, capillary     Status: Abnormal   Collection Time: 04/04/18  6:48 AM  Result Value Ref Range   Glucose-Capillary 285 (H) 70 - 99 mg/dL    Blood Alcohol level:  Lab Results  Component Value Date   ETH <10 82/95/6213    Metabolic Disorder Labs: No results found for: HGBA1C, MPG No results found for: PROLACTIN No results found for: CHOL, TRIG, HDL, CHOLHDL, VLDL, LDLCALC  Physical Findings: AIMS:  , ,  ,  ,    CIWA:    COWS:     Musculoskeletal: Strength & Muscle Tone: decreased Gait & Station: unsteady Patient leans: N/A  Psychiatric Specialty Exam: Physical Exam  ROS  Blood pressure 127/77, pulse (!) 150, temperature 98.1 F (36.7 C), temperature source Oral, resp. rate 20, height 5' (1.524 m), weight 44.9 kg, SpO2 100 %, unknown if currently breastfeeding.Body mass index is 19.33 kg/m.  General Appearance: Disheveled  Eye Contact:  Fair   Speech:  Pressured  Volume:  Decreased  Mood:  Anxious  Affect:  Restricted  Thought Process:  Disorganized  Orientation:  Other:  Person and the general situation that she is in the context of healthcare setting  Thought Content:  Paranoid Ideation  Suicidal Thoughts:  Yes.  without intent/plan  Homicidal Thoughts:  No  Memory:  Immediate;   Poor  Judgement:  Fair  Insight:  Fair  Psychomotor Activity:  Decreased  Concentration:  Concentration: Poor  Recall:  Poor  Fund of Knowledge:  Poor  Language:  Poor  Akathisia:  Negative  Handed:  Right  AIMS (if indicated):     Assets:  Resilience  ADL's:  Intact  Cognition:  WNL  Sleep:  Number of Hours: 6.75  Can recall 2 of 3 and 0 of 3   Treatment Plan Summary: Daily contact with patient to assess and evaluate symptoms and progress in treatment, Medication management and Plan Continue current measures for psychosis continue cognitive and reality based therapy seek diagnostic clarity  Adalind Weitz, MD 04/04/2018, 8:11 AM

## 2018-04-04 NOTE — Progress Notes (Signed)
1:1 Note: Patient maintained on constant supervision for safety.  Presents with calm affect and paranoid mood.  Medications given as prescribed.  Continues to need a lot of redirection on the unit.  Routine safety checks maintained every 15 minutes.

## 2018-04-04 NOTE — Progress Notes (Signed)
Recreation Therapy Notes  Date: 2.27.20 Time: 0950 Location: 500 Hall Dayroom   Group Topic: Communication, Team Building, Problem Solving  Goal Area(s) Addresses:  Patient will effectively work with peer towards shared goal.  Patient will identify skill used to make activity successful.  Patient will identify how skills used during activity can be used to reach post d/c goals.   Behavioral Response:  None  Intervention: STEM Activity   Activity: [employer]. Patients were provided the following materials: 5 drinking straws, 5 rubber bands, 5 paper clips, 2 index cards, and 2 drinking cups. Using the provided materials patients were asked to build a launching mechanisms to launch a ping pong ball approximately 12 feet. Patients were divided into teams of 3-5.   Education: Education officer, community, Dentist.   Education Outcome: Acknowledges education/In group clarification offered/Needs additional education.   Clinical Observations/Feedback:  Pt arrived for the last 10 minutes of group.  Pt sat and observed.    Victorino Sparrow, LRT/CTRS         Victorino Sparrow A 04/04/2018 11:14 AM

## 2018-04-04 NOTE — Progress Notes (Signed)
1:1 Note: Patient maintained on constant supervision for safety.  Patient is calm and cooperative.  No behavioral issues noted.  Medication given as prescribed.  Routine safety checks maintained every 15 minutes.  Patient is safe on the unit.

## 2018-04-04 NOTE — BHH Counselor (Signed)
CSW attempted to complete PSA with pt, however, pt is unable to provide any meaningful information.  CSW will contact legal guardian to gather more information.   Winferd Humphrey, MSW, LCSW Clinical Social Worker 04/04/2018 10:32 AM

## 2018-04-04 NOTE — Progress Notes (Signed)
1:1 Note: Patient maintained on constant supervision for safety.  Patient is calm and cooperative.  Denies suicidal thoughts, auditory and visual hallucinations.  Patient is paranoid and disorganized.  Medications given as prescribed.  Routine safety checks maintained every 15 minutes.  Patient is safe on the unit.  EKG result (Sinus Tachycardia, Pulse 150) reported to MD. TSH ordered.
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
## 2018-04-04 NOTE — Progress Notes (Addendum)
Inpatient Diabetes Program Recommendations  AACE/ADA: New Consensus Statement on Inpatient Glycemic Control (2015)  Target Ranges:  Prepandial:   less than 140 mg/dL      Peak postprandial:   less than 180 mg/dL (1-2 hours)      Critically ill patients:  140 - 180 mg/dL   Lab Results  Component Value Date   GLUCAP 285 (H) 04/04/2018    Review of Glycemic Control  Diabetes history: DM Outpatient Diabetes medications: Humalog 75/25 insulin mix 34 units am + 32 units pm Current orders for Inpatient glycemic control: Novolog moderate correction tid + hs 0-5 units  Inpatient Diabetes Program Recommendations:   Patient probable type 1 diabetes and will need portion of basal insulin.  -Add 80% home regimen -Novolog 70/30 27 units bid ac breakfast and dinner if patient is eating well. Spoke with RN Idell Pickles concerning recommendations and she plans to share with MD.  Thank you, Nani Gasser. Lea Baine, RN, MSN, CDE  Diabetes Coordinator Inpatient Glycemic Control Team Team Pager [phone] (8am-5pm) 04/04/2018 11:55 AM \

## 2018-04-04 NOTE — Progress Notes (Signed)
1:1 note Patient is in bed asleep. Patient respirations even and unlabored. No distress noted. 1:1 observation continued for behavior described earlier.

## 2018-04-05 LAB — GLUCOSE, CAPILLARY
Glucose-Capillary: 194 mg/dL — ABNORMAL HIGH (ref 70–99)
Glucose-Capillary: 134 mg/dL — ABNORMAL HIGH (ref 70–99)
Glucose-Capillary: 178 mg/dL — ABNORMAL HIGH (ref 70–99)
Glucose-Capillary: 201 mg/dL — ABNORMAL HIGH (ref 70–99)

## 2018-04-05 MED ORDER — INSULIN ASPART PROT & ASPART (70-30 MIX) 100 UNIT/ML ~~LOC~~ SUSP
27.0000 [IU] | Freq: Two times a day (BID) | SUBCUTANEOUS | Status: DC
  Administered 2018-04-05 – 2018-04-06 (×2): 27 [IU] via SUBCUTANEOUS

## 2018-04-05 MED ORDER — PROPRANOLOL HCL 10 MG PO TABS
10.0000 mg | ORAL_TABLET | Freq: Two times a day (BID) | ORAL | Status: DC
  Administered 2018-04-05 – 2018-04-06 (×3): 10 mg via ORAL
  Filled 2018-04-05 (×6): qty 1

## 2018-04-05 NOTE — Progress Notes (Signed)
1:1 Note: Patient maintained on constant supervision for safety.  Patient is in bed with eyes closed.  Medication given as prescribed.  Routine safety checks maintained every 15 minutes.  Denies suicidal thoughts, auditory and visual hallucinations.  Reports feeling better today.  Offered support and encouragement as needed.  Patient is safe on the unit with supervision.

## 2018-04-05 NOTE — Progress Notes (Signed)
1:1 note Patient is in bed asleep. Patient respirations even and unlabored. No distress noted. 1:1 observation continued for behavior described earlier.

## 2018-04-05 NOTE — Progress Notes (Signed)
1:1 Note: Patient maintained on constant supervision for safety.  No behavioral issues noted.  Routine safety checks maintained every 15 minutes.  Medication given as prescribed.  Patient is safe on the unit.

## 2018-04-05 NOTE — Progress Notes (Signed)
1:1 Note: Patient maintained on constant supervision for safety.  Patient presents with calm affect and mood.  Medication given as prescribed.  No behavioral issues noted or reported.  Support and encouragement offered as needed.  Patient is safe on the unit with supervision.

## 2018-04-05 NOTE — Progress Notes (Signed)
[facility] MD Progress Note  04/05/2018 9:00 AM Debbie Dorsey  MRN:  [id number] Subjective:    Patient seen in her room she at first reports a dramatic improvement stating she is ready to go home then stating in the next few sentences she would like to stay a little longer apparently there is stress at home but she also fears that she is not completely well she is more coherent there are no bizarre statements she is alert and oriented to person place situation time but not date knows the day of the week denies current auditory visual loose [place] denies thoughts of harming self contracts here and understands what that means.  Remains tachycardic in the presence of normal TSH probably just antipsychotic induced  Principal Problem: Exacerbation and underlying schizophrenic disorder history of alcohol use Diagnosis: Active Problems:   Schizophrenia ([place])  Total Time spent with patient: 20 minutes  Past Medical History:  Past Medical History:  Diagnosis Date  . Diabetes mellitus without complication ([place])    History reviewed. No pertinent surgical history. Family History: History reviewed. No pertinent family history. Family Psychiatric  History: neg Social History:  Social History   Substance and Sexual Activity  Alcohol Use No     Social History   Substance and Sexual Activity  Drug Use No    Social History   Socioeconomic History  . Marital status: Single    Spouse name: Not on file  . Number of children: Not on file  . Years of education: Not on file  . Highest education level: Not on file  Occupational History  . Not on file  Social Needs  . Financial resource strain: Not on file  . Food insecurity:    Worry: Not on file    Inability: Not on file  . Transportation needs:    Medical: Not on file    Non-medical: Not on file  Tobacco Use  . Smoking status: Never Smoker  . Smokeless tobacco: Never Used  Substance and Sexual Activity  . Alcohol use: No  . Drug use:  No  . Sexual activity: Not on file  Lifestyle  . Physical activity:    Days per week: Not on file    Minutes per session: Not on file  . Stress: Not on file  Relationships  . Social connections:    Talks on phone: Not on file    Gets together: Not on file    Attends religious service: Not on file    Active member of club or organization: Not on file    Attends meetings of clubs or organizations: Not on file    Relationship status: Not on file  Other Topics Concern  . Not on file  Social History Narrative  . Not on file   Sleep: Good  Appetite:  Good  Current Medications: Current Facility-Administered Medications  Medication Dose Route Frequency Provider Last Rate Last Dose  . benztropine (COGENTIN) tablet 0.5 mg  0.5 mg Oral BID Johnn Hai, MD   0.5 mg at 04/05/18 0831  . feeding supplement (ENSURE ENLIVE) (ENSURE ENLIVE) liquid 237 mL  237 mL Oral BID BM Johnn Hai, MD   237 mL at 04/04/18 1317  . insulin aspart (novoLOG) injection 0-15 Units  0-15 Units Subcutaneous TID WC Patriciaann Clan E, PA-C   3 Units at 04/05/18 5366  . insulin aspart (novoLOG) injection 0-5 Units  0-5 Units Subcutaneous QHS Laverle Hobby, PA-C   2 Units at 04/04/18 2126  .  insulin aspart (novoLOG) injection 27 Units  27 Units Subcutaneous BID AC Johnn Hai, MD   27 Units at 04/05/18 0831  . OLANZapine zydis (ZYPREXA) disintegrating tablet 10 mg  10 mg Oral Q8H PRN Laverle Hobby, PA-C   10 mg at 04/02/18 2140   And  . ziprasidone (GEODON) injection 20 mg  20 mg Intramuscular PRN Laverle Hobby, PA-C      . prenatal multivitamin tablet 1 tablet  1 tablet Oral Q1200 Johnn Hai, MD   1 tablet at 04/04/18 1317  . propranolol (INDERAL) tablet 10 mg  10 mg Oral BID Johnn Hai, MD      . risperiDONE (RISPERDAL) tablet 2 mg  2 mg Oral BID Johnn Hai, MD   2 mg at 04/05/18 0831  . temazepam (RESTORIL) capsule 15 mg  15 mg Oral QHS PRN Johnn Hai, MD        Lab Results:  Results for orders  placed or performed during the hospital encounter of 04/02/18 (from the past 48 hour(s))  Glucose, capillary     Status: Abnormal   Collection Time: 04/03/18 11:58 AM  Result Value Ref Range   Glucose-Capillary 478 (H) 70 - 99 mg/dL  Glucose, capillary     Status: Abnormal   Collection Time: 04/03/18  2:45 PM  Result Value Ref Range   Glucose-Capillary 285 (H) 70 - 99 mg/dL  Glucose, capillary     Status: Abnormal   Collection Time: 04/03/18  6:39 PM  Result Value Ref Range   Glucose-Capillary 212 (H) 70 - 99 mg/dL   Comment 1 Notify RN    Comment 2 Document in Chart   Glucose, capillary     Status: Abnormal   Collection Time: 04/03/18  8:37 PM  Result Value Ref Range   Glucose-Capillary 325 (H) 70 - 99 mg/dL  Glucose, capillary     Status: Abnormal   Collection Time: 04/04/18  6:48 AM  Result Value Ref Range   Glucose-Capillary 285 (H) 70 - 99 mg/dL  Glucose, capillary     Status: Abnormal   Collection Time: 04/04/18 12:11 PM  Result Value Ref Range   Glucose-Capillary 383 (H) 70 - 99 mg/dL  Glucose, capillary     Status: Abnormal   Collection Time: 04/04/18  4:51 PM  Result Value Ref Range   Glucose-Capillary 277 (H) 70 - 99 mg/dL  TSH     Status: None   Collection Time: 04/04/18  6:49 PM  Result Value Ref Range   TSH 1.072 0.350 - 4.500 uIU/mL    Comment: Performed by a 3rd Generation assay with a functional sensitivity of <=0.01 uIU/mL. Performed at [facility], [place] [address] [place], [place] [address]   Glucose, capillary     Status: Abnormal   Collection Time: 04/04/18  8:15 PM  Result Value Ref Range   Glucose-Capillary 234 (H) 70 - 99 mg/dL  Glucose, capillary     Status: Abnormal   Collection Time: 04/05/18  6:31 AM  Result Value Ref Range   Glucose-Capillary 194 (H) 70 - 99 mg/dL    Blood Alcohol level:  Lab Results  Component Value Date   ETH <10 07/37/1062    Metabolic Disorder Labs: No results found for: HGBA1C, MPG No results  found for: PROLACTIN No results found for: CHOL, TRIG, HDL, CHOLHDL, VLDL, LDLCALC  Physical Findings: AIMS: Facial and Oral Movements Muscles of Facial Expression: None, normal Lips and Perioral Area: None, normal Jaw: None, normal Tongue:  None, normal,Extremity Movements Upper (arms, wrists, hands, fingers): None, normal Lower (legs, knees, ankles, toes): None, normal, Trunk Movements Neck, shoulders, hips: None, normal, Overall Severity Severity of abnormal movements (highest score from questions above): None, normal Incapacitation due to abnormal movements: None, normal Patient's awareness of abnormal movements (rate only patient's report): No Awareness, Dental Status Current problems with teeth and/or dentures?: No Does patient usually wear dentures?: No  CIWA:    COWS:     Musculoskeletal: Strength & Muscle Tone: within normal limits Gait & Station: normal Patient leans: N/A  Psychiatric Specialty Exam: Physical Exam no EPS or TD sinus tachycardia  ROS sinus tachycardia in the presence of normal TSH  Blood pressure 110/75, pulse (!) 152, temperature 99.7 F (37.6 C), temperature source Oral, resp. rate 16, height 5' (1.524 m), weight 44.9 kg, SpO2 100 %, unknown if currently breastfeeding.Body mass index is 19.33 kg/m.  General Appearance: Fairly Groomed  Eye Contact:  Fair  Speech:  Clear and Coherent  Volume:  Decreased  Mood:  Dysphoric  Affect:  Congruent  Thought Process:  Coherent  Orientation:  Full (Time, Place, and Person)  Thought Content:  Illogical  Suicidal Thoughts:  No  Homicidal Thoughts:  No  Memory:  Immediate;   Fair  Judgement:  Fair  Insight:  Fair  Psychomotor Activity:  Normal  Concentration:  Concentration: Good  Recall:  Good  Fund of Knowledge:  Good  Language:  Good  Akathisia:  Negative  Handed:  Right  AIMS (if indicated):     Assets:  Physical Health Resilience Social Support  ADL's:  Intact  Cognition:  WNL  Sleep:  Number  of Hours: 6.5     Treatment Plan Summary: Daily contact with patient to assess and evaluate symptoms and progress in treatment, Medication management and Plan Continue current antipsychotic therapy with benztropine add low-dose Inderal monitor blood pressure continue current reality based therapies probably discharge Monday  Mickenzie Stolar, MD 04/05/2018, 9:00 AM

## 2018-04-05 NOTE — Progress Notes (Signed)
Recreation Therapy Notes  Date: 2.28.20 Time: 1000 Location: 500 Hall  Group Topic: Team building  Goal Area(s) Addresses:  Patient will effectively work together to reach desired goal. Patient will use effective techniques to reach goal. Patient will identify how techniques from activity can by used post d/c.   Intervention: Hydrologist  Activity: Sharks in [employer].  Each patient was given a rubber disc and given one disc for the group.  Patients were to use discs to maneuver each patient from one end of the hall to the other and back to the starting point.  If any patient stepped off of their disc, the group would have to start over.  Education: Communication, Discharge Planning  Education Outcome: Acknowledges understanding/In group clarification offered/Needs additional education.   Clinical Observations/Feedback: Pt did not attend group.     Victorino Sparrow, LRT/CTRS         Victorino Sparrow A 04/05/2018 11:24 AM

## 2018-04-05 NOTE — Progress Notes (Signed)
Inpatient Diabetes Program Recommendations  AACE/ADA: New Consensus Statement on Inpatient Glycemic Control (2015)  Target Ranges:  Prepandial:   less than 140 mg/dL      Peak postprandial:   less than 180 mg/dL (1-2 hours)      Critically ill patients:  140 - 180 mg/dL   Lab Results  Component Value Date   GLUCAP 194 (H) 04/05/2018    Review of Glycemic Control  Inpatient Diabetes Program Recommendations:   Noted patient ordered Novolog 27 units bid. -Please change insulin order to Novolog 70/30 insulin mix 27 units bid. Sent secure message to Dr. Jake Samples and alerted RN Verdene Lennert.  Thank you, Debbie Dorsey. Debbie Leckey, RN, MSN, CDE  Diabetes Coordinator Inpatient Glycemic Control Team Team Pager [phone] (8am-5pm) 04/05/2018 11:44 AM

## 2018-04-05 NOTE — BHH Group Notes (Addendum)
Date: 04/05/18, 1315  Type of Therapy and Topic: Chaplain group, "Hope is.." Chaplain engaged group in discussion about hope and what it looks like in each patients life and current situation.  Participation level: minimal  Modes of Intervention: Discussion, Education and Socialization  Summary of Progress/Problems:Pt came to group halfway through and participated in some of the discussion. Pt tries hard to make comments, mostly repeats what group facilitator said to her.     Joanne Chars, [place] LCSW Group Therapy Note

## 2018-04-06 ENCOUNTER — Observation Stay (HOSPITAL_COMMUNITY)
Admission: AD | Admit: 2018-04-06 | Discharge: 2018-04-08 | Disposition: A | Discharge status: Other Acute Inpatient Hospital | Payer: Medicare Other | Source: Ambulatory Visit | Attending: Internal Medicine | Admitting: Internal Medicine

## 2018-04-06 ENCOUNTER — Inpatient Hospital Stay (HOSPITAL_COMMUNITY): Payer: Medicare Other

## 2018-04-06 ENCOUNTER — Encounter (HOSPITAL_COMMUNITY): Payer: Self-pay

## 2018-04-06 ENCOUNTER — Other Ambulatory Visit: Payer: Self-pay

## 2018-04-06 DIAGNOSIS — R441 Visual hallucinations: Secondary | ICD-10-CM

## 2018-04-06 DIAGNOSIS — F200 Paranoid schizophrenia: Secondary | ICD-10-CM | POA: Diagnosis not present

## 2018-04-06 DIAGNOSIS — R440 Auditory hallucinations: Secondary | ICD-10-CM

## 2018-04-06 DIAGNOSIS — E1165 Type 2 diabetes mellitus with hyperglycemia: Secondary | ICD-10-CM | POA: Insufficient documentation

## 2018-04-06 DIAGNOSIS — Z79899 Other long term (current) drug therapy: Secondary | ICD-10-CM | POA: Insufficient documentation

## 2018-04-06 DIAGNOSIS — I2699 Other pulmonary embolism without acute cor pulmonale: Secondary | ICD-10-CM | POA: Insufficient documentation

## 2018-04-06 DIAGNOSIS — R45851 Suicidal ideations: Secondary | ICD-10-CM

## 2018-04-06 DIAGNOSIS — R000 Tachycardia, unspecified: Secondary | ICD-10-CM | POA: Insufficient documentation

## 2018-04-06 DIAGNOSIS — Z794 Long term (current) use of insulin: Secondary | ICD-10-CM | POA: Insufficient documentation

## 2018-04-06 DIAGNOSIS — I2694 Multiple subsegmental pulmonary emboli without acute cor pulmonale: Secondary | ICD-10-CM

## 2018-04-06 DIAGNOSIS — F23 Brief psychotic disorder: Secondary | ICD-10-CM

## 2018-04-06 DIAGNOSIS — Z791 Long term (current) use of non-steroidal anti-inflammatories (NSAID): Secondary | ICD-10-CM | POA: Insufficient documentation

## 2018-04-06 LAB — BASIC METABOLIC PANEL
Anion gap: 9 (ref 5–15)
BUN: 14 mg/dL (ref 6–20)
CO2: 27 mmol/L (ref 22–32)
Calcium: 9.6 mg/dL (ref 8.9–10.3)
Chloride: 104 mmol/L (ref 98–111)
Creatinine, Ser: 0.83 mg/dL (ref 0.44–1.00)
GFR calc non Af Amer: >60 mL/min (ref >60)
Glucose, Bld: 76 mg/dL (ref 70–99)
Potassium: 3.8 mmol/L (ref 3.5–5.1)
SODIUM: 140 mmol/L (ref 135–145)

## 2018-04-06 LAB — I-STAT TROPONIN, ED: TROPONIN I, POC: 0.01 ng/mL (ref 0.00–0.08)

## 2018-04-06 LAB — CBG MONITORING, ED
GLUCOSE-CAPILLARY: 77 mg/dL (ref 70–99)
Glucose-Capillary: 160 mg/dL — ABNORMAL HIGH (ref 70–99)
Glucose-Capillary: 151 mg/dL — ABNORMAL HIGH (ref 70–99)

## 2018-04-06 LAB — CBC WITH DIFFERENTIAL/PLATELET
Abs Immature Granulocytes: 0.02 10*3/uL (ref 0.00–0.07)
BASOS PCT: 0 %
Basophils Absolute: 0 10*3/uL (ref 0.0–0.1)
Eosinophils Absolute: 0.2 10*3/uL (ref 0.0–0.5)
Eosinophils Relative: 2 %
HCT: 39.1 % (ref 36.0–46.0)
Hemoglobin: 12.2 g/dL (ref 12.0–15.0)
Immature Granulocytes: 0 %
Lymphocytes Relative: 33 %
Lymphs Abs: 2.3 10*3/uL (ref 0.7–4.0)
MCH: 31.6 pg (ref 26.0–34.0)
MCHC: 31.2 g/dL (ref 30.0–36.0)
MCV: 101.3 fL — ABNORMAL HIGH (ref 80.0–100.0)
Monocytes Absolute: 0.4 10*3/uL (ref 0.1–1.0)
Monocytes Relative: 6 %
Neutro Abs: 4 10*3/uL (ref 1.7–7.7)
Neutrophils Relative %: 59 %
Platelets: 248 10*3/uL (ref 150–400)
RBC: 3.86 MIL/uL — AB (ref 3.87–5.11)
RDW: 12.6 % (ref 11.5–15.5)
WBC: 6.9 10*3/uL (ref 4.0–10.5)
nRBC: 0 % (ref 0.0–0.2)

## 2018-04-06 LAB — GLUCOSE, CAPILLARY
Glucose-Capillary: 229 mg/dL — ABNORMAL HIGH (ref 70–99)
Glucose-Capillary: 313 mg/dL — ABNORMAL HIGH (ref 70–99)

## 2018-04-06 LAB — D-DIMER, QUANTITATIVE: D-Dimer, Quant: 9.19 ug/mL-FEU — ABNORMAL HIGH (ref 0.00–0.50)

## 2018-04-06 MED ORDER — ONDANSETRON HCL 4 MG PO TABS: 4.0000 mg | ORAL_TABLET | Freq: Four times a day (QID) | ORAL | Status: DC | PRN

## 2018-04-06 MED ORDER — IOPAMIDOL (ISOVUE-370) INJECTION 76%
INTRAVENOUS | Status: AC
  Filled 2018-04-06: qty 100

## 2018-04-06 MED ORDER — INSULIN ASPART PROT & ASPART (70-30 MIX) 100 UNIT/ML ~~LOC~~ SUSP
32.0000 [IU] | Freq: Two times a day (BID) | SUBCUTANEOUS | Status: DC
  Administered 2018-04-07 – 2018-04-08 (×3): 32 [IU] via SUBCUTANEOUS
  Filled 2018-04-06: qty 10

## 2018-04-06 MED ORDER — INSULIN ASPART 100 UNIT/ML ~~LOC~~ SOLN
0.0000 [IU] | Freq: Three times a day (TID) | SUBCUTANEOUS | Status: DC
  Administered 2018-04-07: 15 [IU] via SUBCUTANEOUS
  Administered 2018-04-07: 8 [IU] via SUBCUTANEOUS
  Administered 2018-04-07: 2 [IU] via SUBCUTANEOUS
  Administered 2018-04-08: 3 [IU] via SUBCUTANEOUS
  Administered 2018-04-08: 11 [IU] via SUBCUTANEOUS

## 2018-04-06 MED ORDER — OLANZAPINE 5 MG PO TBDP
15.0000 mg | ORAL_TABLET | Freq: Two times a day (BID) | ORAL | Status: DC | PRN
  Filled 2018-04-06: qty 3

## 2018-04-06 MED ORDER — RISPERIDONE 1 MG PO TABS
2.0000 mg | ORAL_TABLET | Freq: Two times a day (BID) | ORAL | Status: DC
  Administered 2018-04-06 – 2018-04-08 (×4): 2 mg via ORAL
  Filled 2018-04-06 (×5): qty 2

## 2018-04-06 MED ORDER — PRENATAL MULTIVITAMIN CH
1.0000 | ORAL_TABLET | Freq: Every day | ORAL | Status: DC
  Administered 2018-04-07 – 2018-04-08 (×2): 1 via ORAL
  Filled 2018-04-06 (×2): qty 1

## 2018-04-06 MED ORDER — RIVAROXABAN 20 MG PO TABS: 20.0000 mg | ORAL_TABLET | Freq: Every day | ORAL | Status: DC

## 2018-04-06 MED ORDER — BENZTROPINE MESYLATE 0.5 MG PO TABS
0.5000 mg | ORAL_TABLET | Freq: Two times a day (BID) | ORAL | Status: DC
  Administered 2018-04-06 – 2018-04-08 (×4): 0.5 mg via ORAL
  Filled 2018-04-06 (×4): qty 1

## 2018-04-06 MED ORDER — RIVAROXABAN 15 MG PO TABS: 15.0000 mg | ORAL_TABLET | Freq: Two times a day (BID) | ORAL | Status: DC

## 2018-04-06 MED ORDER — HEPARIN (PORCINE) 25000 UT/250ML-% IV SOLN: 1000.0000 [IU]/h | INTRAVENOUS | Status: DC

## 2018-04-06 MED ORDER — RISPERIDONE 2 MG PO TABS
3.0000 mg | ORAL_TABLET | Freq: Every day | ORAL | Status: DC
  Filled 2018-04-06: qty 1

## 2018-04-06 MED ORDER — LORAZEPAM 0.5 MG PO TABS: 0.5000 mg | ORAL_TABLET | Freq: Two times a day (BID) | ORAL | Status: DC | PRN

## 2018-04-06 MED ORDER — TEMAZEPAM 15 MG PO CAPS: 15.0000 mg | ORAL_CAPSULE | Freq: Every evening | ORAL | Status: DC | PRN

## 2018-04-06 MED ORDER — INSULIN ASPART PROT & ASPART (70-30 MIX) 100 UNIT/ML ~~LOC~~ SUSP
32.0000 [IU] | Freq: Two times a day (BID) | SUBCUTANEOUS | Status: DC
  Filled 2018-04-06: qty 10

## 2018-04-06 MED ORDER — ACETAMINOPHEN 325 MG PO TABS: 650.0000 mg | ORAL_TABLET | Freq: Four times a day (QID) | ORAL | Status: DC | PRN

## 2018-04-06 MED ORDER — SODIUM CHLORIDE (PF) 0.9 % IJ SOLN
INTRAMUSCULAR | Status: AC
  Filled 2018-04-06: qty 50

## 2018-04-06 MED ORDER — RISPERIDONE 2 MG PO TABS
2.0000 mg | ORAL_TABLET | Freq: Every day | ORAL | Status: DC
  Filled 2018-04-06: qty 1

## 2018-04-06 MED ORDER — RIVAROXABAN 15 MG PO TABS
15.0000 mg | ORAL_TABLET | Freq: Two times a day (BID) | ORAL | Status: DC
  Administered 2018-04-07 – 2018-04-08 (×3): 15 mg via ORAL
  Filled 2018-04-06 (×3): qty 1

## 2018-04-06 MED ORDER — GLUCERNA SHAKE PO LIQD
237.0000 mL | Freq: Two times a day (BID) | ORAL | Status: DC
  Administered 2018-04-06 (×2): 237 mL via ORAL
  Filled 2018-04-06: qty 237

## 2018-04-06 MED ORDER — METOPROLOL TARTRATE 25 MG PO TABS
12.5000 mg | ORAL_TABLET | Freq: Two times a day (BID) | ORAL | Status: DC
  Administered 2018-04-06: 12.5 mg via ORAL
  Filled 2018-04-06 (×4): qty 1

## 2018-04-06 MED ORDER — SODIUM CHLORIDE 0.9 % IV BOLUS
1000.0000 mL | Freq: Once | INTRAVENOUS | Status: AC
  Administered 2018-04-06: 1000 mL via INTRAVENOUS

## 2018-04-06 MED ORDER — RIVAROXABAN 15 MG PO TABS
15.0000 mg | ORAL_TABLET | Freq: Two times a day (BID) | ORAL | Status: DC
  Administered 2018-04-06: 15 mg via ORAL
  Filled 2018-04-06: qty 1

## 2018-04-06 MED ORDER — INSULIN ASPART 100 UNIT/ML ~~LOC~~ SOLN
0.0000 [IU] | Freq: Every day | SUBCUTANEOUS | Status: DC
  Administered 2018-04-06: 4 [IU] via SUBCUTANEOUS

## 2018-04-06 MED ORDER — ACETAMINOPHEN 650 MG RE SUPP: 650.0000 mg | Freq: Four times a day (QID) | RECTAL | Status: DC | PRN

## 2018-04-06 MED ORDER — GLUCERNA SHAKE PO LIQD
237.0000 mL | Freq: Three times a day (TID) | ORAL | Status: DC
  Administered 2018-04-07 – 2018-04-08 (×2): 237 mL via ORAL
  Filled 2018-04-06 (×7): qty 237

## 2018-04-06 MED ORDER — HEPARIN BOLUS VIA INFUSION
1400.0000 [IU] | Freq: Once | INTRAVENOUS | Status: DC
  Filled 2018-04-06: qty 1400

## 2018-04-06 MED ORDER — IOPAMIDOL (ISOVUE-370) INJECTION 76%
100.0000 mL | Freq: Once | INTRAVENOUS | Status: AC | PRN
  Administered 2018-04-06: 100 mL via INTRAVENOUS

## 2018-04-06 MED ORDER — ONDANSETRON HCL 4 MG/2ML IJ SOLN: 4.0000 mg | Freq: Four times a day (QID) | INTRAMUSCULAR | Status: DC | PRN

## 2018-04-06 NOTE — Discharge Instructions (Signed)
Information on my medicine - XARELTO (rivaroxaban)  This medication education was reviewed with me or my healthcare representative as part of my discharge preparation.  The pharmacist that spoke with me during my hospital stay was:  Unable to educate patient, schizophrenic  [place]? Xarelto was prescribed to treat blood clots that may have been found in the veins of your legs (deep vein thrombosis) or in your lungs (pulmonary embolism) and to reduce the risk of them occurring again.  What do you need to know about Xarelto? The starting dose is one 15 mg tablet taken TWICE daily with food for the FIRST 21 DAYS then on (enter date)  3/22  the dose is changed to one 20 mg tablet taken ONCE A DAY with your evening meal.  DO NOT stop taking Xarelto without talking to the health care provider who prescribed the medication.  Refill your prescription for 20 mg tablets before you run out.  After discharge, you should have regular check-up appointments with your healthcare provider that is prescribing your Xarelto.  In the future your dose may need to be changed if your kidney function changes by a significant amount.  What do you do if you miss a dose? If you are taking Xarelto TWICE DAILY and you miss a dose, take it as soon as you remember. You may take two 15 mg tablets (total 30 mg) at the same time then resume your regularly scheduled 15 mg twice daily the next day.  If you are taking Xarelto ONCE DAILY and you miss a dose, take it as soon as you remember on the same day then continue your regularly scheduled once daily regimen the next day. Do not take two doses of Xarelto at the same time.   Important Safety Information Xarelto is a blood thinner medicine that can cause bleeding. You should call your healthcare provider right away if you experience any of the following: ? Bleeding from an injury or your nose that does not stop. ? Unusual colored urine (red or  dark brown) or unusual colored stools (red or black). ? Unusual bruising for unknown reasons. ? A serious fall or if you hit your head (even if there is no bleeding).  Some medicines may interact with Xarelto and might increase your risk of bleeding while on Xarelto. To help avoid this, consult your healthcare provider or pharmacist prior to using any new prescription or non-prescription medications, including herbals, vitamins, non-steroidal anti-inflammatory drugs (NSAIDs) and supplements.  This website has more information on Xarelto: [email or web address]

## 2018-04-06 NOTE — Progress Notes (Signed)
1:1 Note Pt awake at this time, reported having slept well with no problems. Pt denies SI, AVH at this time and contracted for safety. Pt maintained on 1:1 obs for safety, staff remain with the pt, will continue to monitor.
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
## 2018-04-06 NOTE — Progress Notes (Signed)
Nursing 1:1 Note  D: Pt in dayroom with 1:1 sitter, she remains very delusional. Pt also continues to have poor boundaries and no insight. Pts heart rate remains high, Per provider Farris Has NP pt will be sent to [facility] for fuilds. Pt reports being negative SI/HI, no AH/VH noted. A: 1:1 continued for pt safety. R: Pt safety maintained.

## 2018-04-06 NOTE — Progress Notes (Signed)
1:1 Note Pt observed seated in the dayroom with peers watching TV at the beginning of the shift. Pt reports having had a good day, good appetite and good mood. Pt denied SI, AVH and verbally contaracted. Pt remain on 1:1 obs for safety, staff by the side, will continue to monitor.

## 2018-04-06 NOTE — Progress Notes (Addendum)
Hunker for Xarelto, initial Heparin bolus/infusion discontinued Indication: pulmonary embolus  No Known Allergies  Patient Measurements: Height: 5' (152.4 cm) Weight: 98 lb 15.8 oz (44.9 kg) IBW/kg (Calculated) : 45.5 Heparin Dosing Weight: 50kg  Vital Signs: Temp: 98.3 F (36.8 C) (02/29 1059) Temp Source: Oral (02/29 1059) BP: 102/80 (02/29 1059) Pulse Rate: 110 (02/29 1059)  Labs: Recent Labs    04/06/18 1225  HGB 12.2  HCT 39.1  PLT 248  CREATININE 0.83   Estimated Creatinine Clearance: 60 mL/min (by C-G formula based on SCr of 0.83 mg/dL).  Medical History: Past Medical History:  Diagnosis Date  . Diabetes mellitus without complication ([facility])    Medications:  Scheduled:  . benztropine  0.5 mg Oral BID  . feeding supplement (GLUCERNA SHAKE)  237 mL Oral BID BM  . heparin  1,400 Units Intravenous Once  . insulin aspart  0-15 Units Subcutaneous TID WC  . insulin aspart  0-5 Units Subcutaneous QHS  . insulin aspart protamine- aspart  32 Units Subcutaneous BID WC  . iopamidol      . metoprolol tartrate  12.5 mg Oral BID  . prenatal multivitamin  1 tablet Oral Q1200  . [START ON 04/07/2018] risperiDONE  2 mg Oral Daily  . risperiDONE  3 mg Oral QHS  . sodium chloride (PF)       Infusions:  . heparin      Assessment: 35 [facility]F [facility] (hx schizophrenia), sent to ED for fluids, chest CT: new PE, plan begin IV Heparin Baseline Hgb 12.2, Plt 248  Change in Therapy: Discontinue Heparin, begin Xarelto for PE  Goal of Therapy:  Heparin level 0.3-0.7 units/ml Monitor platelets by anticoagulation protocol: Yes   Plan:   Discontinue Heparin bolus, infusion orders  Xarelto 15mg  bid x 21 days, then on 3/22 dose change to Xarelto 20mg  daily  Unable to counsel patient on medication  Monitor CBC, s/s bleed  Minda Ditto PharmD Pager [phone] 04/06/2018, 2:59 PM
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
## 2018-04-06 NOTE — H&P (Signed)
[facility]   Patient Name: Debbie Dorsey    HLK:[id number] PCP: Patient, No Pcp Per     DOB: Oct 07, 1970  DOA: 04/06/2018 DOS: the patient was seen and examined on 04/06/2018   Referring physician: Dr. Thad Ranger PA Reason for consult: Admission for pulmonary embolism  HPI: Debbie Dorsey is a 48 y.o. female with Past medical history of schizophrenia type 2 diabetes mellitus. Patient is coming from behavioral health, originally presented from home Patient was brought in originally on 04/02/2018 by her grandfather with complaints that the patient has been having paranoia and auditory as well as visual hallucination which was ongoing 3 to 4 days before that. Patient was seen by a psychiatrist and was accepted for voluntary admission for schizophrenia and acute psychosis. Been tachycardic throughout her stay at the behavioral health and was initially started on propranolol for that. Patient continues to have auditory hallucination. At the time of my evaluation patient continues to have tangential thoughts, she reports that she has been having chest pain and on further questioning she shows that her chest pain is actually in her neck and she has been having this pain for last 10 years. She also tells me that she has been all over states and she moved to [place] 6 months ago from [place]. On further questioning does she have a family member she reports that she has a mother and she would like me to talk to her mother. She denied any shortness of breath denied any nausea denied any vomiting. No diarrhea reported.  No fever no chills identified.  After confirming from the chart that patient's grandfather is her legal guardian and emergency contact I contacted Mr. spell to obtain further history. There is no history of recent hospitalization, travel, family history of blood clots. Ms. Taffe also does not have any prior history of blood clots.  Review of Systems: as  mentioned in the history of present illness.  All other systems reviewed and are negative.  Past Medical History:  Diagnosis Date  . Diabetes mellitus without complication ([place])    No past surgical history on file. Social History:  reports that she has never smoked. She has never used smokeless tobacco. She reports that she does not drink alcohol or use drugs.  No Known Allergies  Family History  Problem Relation Age of Onset  . Mental illness Sister     Prior to Admission medications   Medication Sig Start Date End Date Taking? Authorizing Provider  benztropine (COGENTIN) 1 MG tablet Take 1 mg by mouth at bedtime. 03/13/18   [provider]  HUMALOG MIX 75/25 KWIKPEN (75-25) 100 UNIT/ML Kwikpen Inject 32-34 Units into the skin See admin instructions. Take 32 units in the evening and 34 units in the morning 01/21/18   [provider]  naproxen (EC NAPROSYN) 500 MG EC tablet Take 1 tablet (500 mg total) by mouth 2 (two) times daily with a meal. Patient not taking: Reported on 04/02/2018 11/14/16   Shelda Pal, DO  risperiDONE (RISPERDAL) 1 MG tablet Take 1 mg by mouth at bedtime. 04/01/18   [provider]  risperiDONE microspheres (RISPERDAL CONSTA) 25 MG injection Inject 37.5 mg into the muscle every 14 (fourteen) days.    [provider]  simvastatin (ZOCOR) 40 MG tablet Take 40 mg by mouth daily at 12 noon.     [provider]    Physical Exam: There were no vitals filed for this visit.  General: Alert,  Awake and Oriented to Time, Place and Person. Appear in mild distress, affect blunted Eyes: PERRL, Conjunctiva normal ENT: Oral Mucosa clear moist Neck: no JVD, no Abnormal Mass Or lumps Cardiovascular: S1 and S2 Present, no Murmur, Peripheral Pulses Present Respiratory: Bilateral Air entry equal and Decreased, no use of accessory muscle, Clear to Auscultation, no Crackles, no wheezes Abdomen: Bowel Sound present, Soft and no  tenderness Skin: redness no, no Rash, no induration Extremities: no Pedal edema, no calf tenderness Neurologic: Grossly no focal neuro deficit. Bilaterally Equal motor strength  Labs:  CBC: Recent Labs  Lab 04/02/18 0941 04/06/18 1225  WBC 7.0 6.9  NEUTROABS  --  4.0  HGB 12.7 12.2  HCT 39.8 39.1  MCV 98.8 101.3*  PLT 423* 644   Basic Metabolic Panel: Recent Labs  Lab 04/02/18 0941 04/06/18 1225  NA 140 140  K 4.0 3.8  CL 108 104  CO2 20* 27  GLUCOSE 308* 76  BUN 14 14  CREATININE 1.09* 0.83  CALCIUM 9.6 9.6   Liver Function Tests: Recent Labs  Lab 04/02/18 0941  AST 30  ALT 26  ALKPHOS 55  BILITOT 0.7  PROT 7.9  ALBUMIN 4.1   No results for input(s): LIPASE, AMYLASE in the last 168 hours. No results for input(s): AMMONIA in the last 168 hours.  Cardiac Enzymes: No results for input(s): CKTOTAL, CKMB, CKMBINDEX, TROPONINI in the last 168 hours. No results for input(s): PROBNP in the last 8760 hours.  CBG: Recent Labs  Lab 04/05/18 2052 04/06/18 0555 04/06/18 1201 04/06/18 1507 04/06/18 1733  GLUCAP 201* 229* 77 160* 151*    Radiological Exams: Ct Angio Chest Pe W And/or Wo Contrast  Result Date: 04/06/2018 CLINICAL DATA:  Shortness of breath, chest pain for several months EXAM: CT ANGIOGRAPHY CHEST WITH CONTRAST TECHNIQUE: Multidetector CT imaging of the chest was performed using the standard protocol during bolus administration of intravenous contrast. Multiplanar CT image reconstructions and MIPs were obtained to evaluate the vascular anatomy. CONTRAST:  165mL ISOVUE-370 IOPAMIDOL (ISOVUE-370) INJECTION 76% COMPARISON:  None FINDINGS: Cardiovascular: Satisfactory opacification of the pulmonary arteries to the segmental level. Small nonocclusive pulmonary embolus in the right lower lobar pulmonary artery and right lower lobe segmental pulmonary arteries. Pulmonary embolus in the right upper lobe segmental pulmonary arteries. Small pulmonary embolus in  the left upper lobe segmental branch. Normal heart size. No pericardial effusion. Mediastinum/Nodes: No enlarged mediastinal, hilar, or axillary lymph nodes. Thyroid gland, trachea, and esophagus demonstrate no significant findings. Lungs/Pleura: Lungs are clear. No pleural effusion or pneumothorax. Upper Abdomen: No acute abnormality. Musculoskeletal: No acute osseous abnormality. No aggressive osseous lesion. Review of the MIP images confirms the above findings. IMPRESSION: 1. Small bilateral pulmonary emboli involving the right lower lobar pulmonary artery, right lower lobe segmental pulmonary artery, right upper lobe segmental pulmonary artery, and left upper lobe segmental pulmonary artery. 2. No focal consolidation. Critical Value/emergent results were called by telephone at the time of interpretation on 04/06/2018 at 2:40 pm to [facility] , who verbally acknowledged these results. Electronically Signed   By: Kathreen Devoid   On: 04/06/2018 14:43    EKG: Independently reviewed. nonspecific ST and T waves changes, sinus tachycardia.  Assessment/Plan 1.  Acute pulmonary embolism.-Provoked Patient presents to the hospital with complaints of sinus tachycardia. During the work-up her d-dimer was elevated. CT scan of the chest was performed which is positive for mild bilateral pulmonary emboli in the right lower lobe, right upper lobe, left upper lobe  segmental arteries without it consolidation. No RV strain. Patient is not hypoxic. Patient does have mild sinus tachycardia but no significant orthostatic drop in her blood pressure. Patient denies any dizziness or lightheadedness or chest pain. At this point it is felt that the patient can safely be treated outpatient with DOAC. Given her history of schizophrenia compliance would be a major issue and therefore single daily dose agent would be appropriate.  Therefore Xarelto was chosen.  I discussed with her grandfather who is the legal guardian who is  also agreeable with the plan. I will consider this is a provoked event from her recent stay in the hospital, and patient will probably require a minimum of 3 months of anticoagulation. I will also perform a hypercoagulable panel although utility of that can be questionable in an acute set up. No significant edema identified in the leg. PESI score is 66, which would be classified as low risk and can safely managed outpatient.  EDP is Concern is regarding follow up and outpatient management which will be a challenge in this pt with schizophrenia  In summary the plan is following. Xarelto starter pack-15mg  twice daily for 21 days followed by 20 mg daily. Hypercoagulable panel. Follow-up with PCP in 3 months to discuss discontinuation of anticoagulation. Case management consult to ensure patient has adequate resources for outpatient Xarelto treatment.  2.  Sinus tachycardia. No significant drop in orthostasis. Although heart rate appreciably goes up when the patient stands up. It appears to be multifactorial. Patient has active schizophrenia with signs of psychosis. Patient was on benzodiazepine which is currently on hold. New diagnosis of PE. Patient remains asymptomatic. At present would encourage oral hydration. Would not treat or worry about heart rate as long as patient remains asymptomatic or blood pressure is stable without any antihypertensive medication. We will discontinue Inderal and metoprolol. Continue treatment for schizophrenia and psychosis.  3.  Type 2 diabetes mellitus. Uncontrolled with hyperglycemia. No complication. Hypoglycemia can certainly cause volume loss which can cause tachycardia. Agree with continuing sliding scale insulin. Continue NovoLog, will increase the dose from 32 units to 34 units twice daily. Carb modified diet recommended.  4.  Schizophrenia. Acute psychosis. Management per behavioral health. Patient to be able to go back to behavioral health  for continuation of her treatment, currently medically cleared.  Family Communication: Discussed with patient's grandfather who is the legal power of attorney. Primary team communication: Discussed with the EDP, recommended transition to oral anticoagulation and discharged back to behavioral health.  Addendum: Discussed with psychiatry attending on call for the patient as well, who agrees with the plan.  Berle Mull 7:33 PM 04/06/2018    Addendum: Received a call back from [facility] who mentions that she has talked with their director and refuses to take the patient to behavioral health due to her new PE diagnosis.  Patient will be admitted for observation for schizophrenia.   AC recommends to reconsult psychiatry, and they will determine tomorrow whether the patient is stable for discharge back to [facility] or not.  Berle Mull 7:33 PM 04/06/2018    Thank you very much for involving [place] in care of your patient.  We will sign off at present, please call us again as needed.   Addendum: As mentioned above patient was medically cleared but behavioral health is unable to take the patient back for safety reason.  Patient will require ongoing treatment for her psychosis as she was still hallucinating at the time of my evaluation therefore  will admit the patient for observation and we will consult psychiatry. Continue Xarelto. Continue insulin sliding scale as well as 32 units of 70/30 insulin. All orders from behavioral health were carried forward other than Geodon which requires a psychiatric approval. Holding metoprolol. Encourage p.o. fluid. Social worker consulted, will need to identify safe discharge plan for the patient.  Berle Mull 7:33 PM 04/06/2018    Author: Berle Mull, MD [facility] 04/06/2018 7:33 PM    If 7PM-7AM, please contact night-coverage [email or web address]

## 2018-04-06 NOTE — ED Notes (Signed)
ED TO INPATIENT HANDOFF REPORT  Name/Age/Gender Debbie Dorsey 48 y.o. female  Code Status    Code Status Orders  (From admission, onward)         Start     Ordered   04/02/18 2123  Full code  Continuous     04/02/18 2122        Code Status History    This patient has a current code status but no historical code status.      Home/SNF/Other Home  Chief Complaint schizophrenia  Level of Care/Admitting Diagnosis ED Disposition    ED Disposition Condition Comment   Admit  Hospital Area: [facility] [[id number]]  Level of Care: Med-Surg [16]  Diagnosis: Schizophrenia [facility]) [[id number]]  Admitting Physician: Lavina Hamman [[id number]]  Attending Physician: Lavina Hamman [[id number]]  PT Class (Do Not Modify): Observation [104]  PT Acc Code (Do Not Modify): Observation [10022]       Medical History Past Medical History:  Diagnosis Date  . Diabetes mellitus without complication ([place])     Allergies No Known Allergies  IV Location/Drains/Wounds Patient Lines/Drains/Airways Status   Active Line/Drains/Airways    Name:   Placement date:   Placement time:   Site:   Days:   Peripheral IV 04/06/18 Antecubital   04/06/18    1219    Antecubital   less than 1          Labs/Imaging Results for orders placed or performed during the hospital encounter of 04/02/18 (from the past 48 hour(s))  TSH     Status: None   Collection Time: 04/04/18  6:49 PM  Result Value Ref Range   TSH 1.072 0.350 - 4.500 uIU/mL    Comment: Performed by a 3rd Generation assay with a functional sensitivity of <=0.01 uIU/mL. Performed at [facility], [place] [address] [place], [place] [address]   Glucose, capillary     Status: Abnormal   Collection Time: 04/04/18  8:15 PM  Result Value Ref Range   Glucose-Capillary 234 (H) 70 - 99 mg/dL  Glucose, capillary     Status: Abnormal   Collection Time: 04/05/18  6:31 AM  Result Value Ref Range   Glucose-Capillary 194 (H) 70 - 99 mg/dL  Glucose, capillary     Status: Abnormal   Collection Time: 04/05/18 11:58 AM  Result Value Ref Range   Glucose-Capillary 134 (H) 70 - 99 mg/dL  Glucose, capillary     Status: Abnormal   Collection Time: 04/05/18  5:36 PM  Result Value Ref Range   Glucose-Capillary 178 (H) 70 - 99 mg/dL  Glucose, capillary     Status: Abnormal   Collection Time: 04/05/18  8:52 PM  Result Value Ref Range   Glucose-Capillary 201 (H) 70 - 99 mg/dL  Glucose, capillary     Status: Abnormal   Collection Time: 04/06/18  5:55 AM  Result Value Ref Range   Glucose-Capillary 229 (H) 70 - 99 mg/dL  CBG monitoring, ED     Status: None   Collection Time: 04/06/18 12:01 PM  Result Value Ref Range   Glucose-Capillary 77 70 - 99 mg/dL  CBC with Differential     Status: Abnormal   Collection Time: 04/06/18 12:25 PM  Result Value Ref Range   WBC 6.9 4.0 - 10.5 K/uL   RBC 3.86 (L) 3.87 - 5.11 MIL/uL   Hemoglobin 12.2 12.0 - 15.0 g/dL   HCT 39.1 36.0 - 46.0 %   MCV 101.3 (H) 80.0 -  100.0 fL   MCH 31.6 26.0 - 34.0 pg   MCHC 31.2 30.0 - 36.0 g/dL   RDW 12.6 11.5 - 15.5 %   Platelets 248 150 - 400 K/uL   nRBC 0.0 0.0 - 0.2 %   Neutrophils Relative % 59 %   Neutro Abs 4.0 1.7 - 7.7 K/uL   Lymphocytes Relative 33 %   Lymphs Abs 2.3 0.7 - 4.0 K/uL   Monocytes Relative 6 %   Monocytes Absolute 0.4 0.1 - 1.0 K/uL   Eosinophils Relative 2 %   Eosinophils Absolute 0.2 0.0 - 0.5 K/uL   Basophils Relative 0 %   Basophils Absolute 0.0 0.0 - 0.1 K/uL   Immature Granulocytes 0 %   Abs Immature Granulocytes 0.02 0.00 - 0.07 K/uL    Comment: Performed at [facility], [place] [address] [place], [place] [address]  Basic metabolic panel     Status: None   Collection Time: 04/06/18 12:25 PM  Result Value Ref Range   Sodium 140 135 - 145 mmol/L   Potassium 3.8 3.5 - 5.1 mmol/L   Chloride 104 98 - 111 mmol/L   CO2 27 22 - 32 mmol/L   Glucose, Bld 76 70 - 99 mg/dL    BUN 14 6 - 20 mg/dL   Creatinine, Ser 0.83 0.44 - 1.00 mg/dL   Calcium 9.6 8.9 - 10.3 mg/dL   GFR calc non Af Amer >60 >60 mL/min   GFR calc Af Amer >60 >60 mL/min   Anion gap 9 5 - 15    Comment: Performed at [facility], [place] [address], [place], [place] [address]  D-dimer, quantitative (not at [facility])     Status: Abnormal   Collection Time: 04/06/18 12:25 PM  Result Value Ref Range   D-Dimer, Quant 9.19 (H) 0.00 - 0.50 ug/mL-FEU    Comment: (NOTE) At the manufacturer cut-off of 0.50 ug/mL FEU, this assay has been documented to exclude PE with a sensitivity and negative predictive value of 97 to 99%.  At this time, this assay has not been approved by the FDA to exclude DVT/VTE. Results should be correlated with clinical presentation. Performed at [facility], [place] [address], [place], [place] [address]   POC CBG, ED     Status: Abnormal   Collection Time: 04/06/18  3:07 PM  Result Value Ref Range   Glucose-Capillary 160 (H) 70 - 99 mg/dL  I-Stat Troponin, ED (not at [facility])     Status: None   Collection Time: 04/06/18  3:13 PM  Result Value Ref Range   Troponin i, poc 0.01 0.00 - 0.08 ng/mL   Comment 3            Comment: Due to the release kinetics of cTnI, a negative result within the first hours of the onset of symptoms does not rule out myocardial infarction with certainty. If myocardial infarction is still suspected, repeat the test at appropriate intervals.   CBG monitoring, ED     Status: Abnormal   Collection Time: 04/06/18  5:33 PM  Result Value Ref Range   Glucose-Capillary 151 (H) 70 - 99 mg/dL   Ct Angio Chest Pe W And/or Wo Contrast  Result Date: 04/06/2018 CLINICAL DATA:  Shortness of breath, chest pain for several months EXAM: CT ANGIOGRAPHY CHEST WITH CONTRAST TECHNIQUE: Multidetector CT imaging of the chest was performed using the standard protocol during bolus administration of intravenous contrast. Multiplanar CT image  reconstructions and MIPs  were obtained to evaluate the vascular anatomy. CONTRAST:  151mL ISOVUE-370 IOPAMIDOL (ISOVUE-370) INJECTION 76% COMPARISON:  None FINDINGS: Cardiovascular: Satisfactory opacification of the pulmonary arteries to the segmental level. Small nonocclusive pulmonary embolus in the right lower lobar pulmonary artery and right lower lobe segmental pulmonary arteries. Pulmonary embolus in the right upper lobe segmental pulmonary arteries. Small pulmonary embolus in the left upper lobe segmental branch. Normal heart size. No pericardial effusion. Mediastinum/Nodes: No enlarged mediastinal, hilar, or axillary lymph nodes. Thyroid gland, trachea, and esophagus demonstrate no significant findings. Lungs/Pleura: Lungs are clear. No pleural effusion or pneumothorax. Upper Abdomen: No acute abnormality. Musculoskeletal: No acute osseous abnormality. No aggressive osseous lesion. Review of the MIP images confirms the above findings. IMPRESSION: 1. Small bilateral pulmonary emboli involving the right lower lobar pulmonary artery, right lower lobe segmental pulmonary artery, right upper lobe segmental pulmonary artery, and left upper lobe segmental pulmonary artery. 2. No focal consolidation. Critical Value/emergent results were called by telephone at the time of interpretation on 04/06/2018 at 2:40 pm to [facility] , who verbally acknowledged these results. Electronically Signed   By: Kathreen Devoid   On: 04/06/2018 14:43   None  Pending Labs Unresulted Labs (From admission, onward)    Start     Ordered   04/06/18 1612  Antithrombin III  (Hypercoagulable Panel, Comprehensive (PNL))  Once,   R     04/06/18 1611   04/06/18 1612  Protein C activity  (Hypercoagulable Panel, Comprehensive (PNL))  Once,   R     04/06/18 1611   04/06/18 1612  Protein C, total  (Hypercoagulable Panel, Comprehensive (PNL))  Once,   R     04/06/18 1611   04/06/18 1612  Protein S activity  (Hypercoagulable Panel,  Comprehensive (PNL))  Once,   R     04/06/18 1611   04/06/18 1612  Protein S, total  (Hypercoagulable Panel, Comprehensive (PNL))  Once,   R     04/06/18 1611   04/06/18 1612  Lupus anticoagulant panel  (Hypercoagulable Panel, Comprehensive (PNL))  Once,   R     04/06/18 1611   04/06/18 1612  Beta-2-glycoprotein i abs, IgG/M/A  (Hypercoagulable Panel, Comprehensive (PNL))  Once,   R     04/06/18 1611   04/06/18 1612  Homocysteine, serum  (Hypercoagulable Panel, Comprehensive (PNL))  Once,   R     04/06/18 1611   04/06/18 1612  Factor 5 leiden  (Hypercoagulable Panel, Comprehensive (PNL))  Once,   R     04/06/18 1611   04/06/18 1612  Prothrombin gene mutation  (Hypercoagulable Panel, Comprehensive (PNL))  Once,   R     04/06/18 1611   04/06/18 1612  Cardiolipin antibodies, IgG, IgM, IgA  (Hypercoagulable Panel, Comprehensive (PNL))  Once,   R     04/06/18 1611          Vitals/Pain Today's Vitals   04/06/18 1600 04/06/18 1645 04/06/18 1730 04/06/18 1816  BP: 128/83 118/83 (!) 130/96 120/80  Pulse: (!) 109 (!) 112 (!) 130 (!) 123  Resp: (!) 22 (!) 21 19 20   Temp:      TempSrc:      SpO2: 97% 97% 100% 100%  Weight:      Height:      PainSc:        Isolation Precautions No active isolations  Medications Medications  OLANZapine zydis (ZYPREXA) disintegrating tablet 10 mg (10 mg Oral Given 04/02/18 2140)    And  LORazepam (ATIVAN)  tablet 1 mg (1 mg Oral Given 04/02/18 2220)    And  ziprasidone (GEODON) injection 20 mg (has no administration in time range)  insulin aspart (novoLOG) injection 0-15 Units (2 Units Subcutaneous Given 04/06/18 1748)  insulin aspart (novoLOG) injection 0-5 Units (2 Units Subcutaneous Given 04/05/18 2122)  benztropine (COGENTIN) tablet 0.5 mg (0.5 mg Oral Given 04/06/18 1748)  temazepam (RESTORIL) capsule 15 mg (has no administration in time range)  prenatal multivitamin tablet 1 tablet (1 tablet Oral Given 04/05/18 1208)  feeding supplement (GLUCERNA  SHAKE) (GLUCERNA SHAKE) liquid 237 mL (237 mLs Oral Given 04/06/18 1746)  insulin aspart protamine- aspart (NOVOLOG MIX 70/30) injection 32 Units (has no administration in time range)  metoprolol tartrate (LOPRESSOR) tablet 12.5 mg (12.5 mg Oral Given 04/06/18 1747)  risperiDONE (RISPERDAL) tablet 2 mg (has no administration in time range)  risperiDONE (RISPERDAL) tablet 3 mg (has no administration in time range)  sodium chloride (PF) 0.9 % injection (has no administration in time range)  iopamidol (ISOVUE-370) 76 % injection (has no administration in time range)  Rivaroxaban (XARELTO) tablet 15 mg (15 mg Oral Given 04/06/18 1731)    Followed by  rivaroxaban (XARELTO) tablet 20 mg (has no administration in time range)  sodium chloride 0.9 % bolus 1,000 mL (0 mLs Intravenous Stopped 04/06/18 1509)  iopamidol (ISOVUE-370) 76 % injection 100 mL (100 mLs Intravenous Contrast Given 04/06/18 1353)    Mobility walks

## 2018-04-06 NOTE — ED Notes (Signed)
Bed: WLPT4 Expected date:  Expected time:  Means of arrival:  Comments: 

## 2018-04-06 NOTE — Consult Note (Addendum)
[facility] Initial Consultation Note   Patient Name: Debbie Dorsey    PXT:[id number] PCP: Patient, No Pcp Per     DOB: [birth year]  DOA: 04/02/2018 DOS: the patient was seen and examined on 04/06/2018   Referring physician: Dr. Thad Ranger PA Reason for consult: Admission for pulmonary embolism  HPI: Debbie Dorsey is a 48 y.o. female with Past medical history of schizophrenia type 2 diabetes mellitus. Patient is coming from behavioral health, originally presented from home Patient was brought in originally on 04/02/2018 by her grandfather with complaints that the patient has been having paranoia and auditory as well as visual hallucination which was ongoing 3 to 4 days before that. Patient was seen by a psychiatrist and was accepted for voluntary admission for schizophrenia and acute psychosis. Been tachycardic throughout her stay at the behavioral health and was initially started on propranolol for that. Patient continues to have auditory hallucination. At the time of my evaluation patient continues to have tangential thoughts, she reports that she has been having chest pain and on further questioning she shows that her chest pain is actually in her neck and she has been having this pain for last 10 years. She also tells me that she has been all over states and she moved to [place] 6 months ago from [place]. On further questioning does she have a family member she reports that she has a mother and she would like me to talk to her mother. She denied any shortness of breath denied any nausea denied any vomiting. No diarrhea reported.  No fever no chills identified.  After confirming from the chart that patient's grandfather is her legal guardian and emergency contact I contacted Mr. spell to obtain further history. There is no history of recent hospitalization, travel, family history of blood clots. Debbie Dorsey also does not have any prior history of blood  clots.  Review of Systems: as mentioned in the history of present illness.  All other systems reviewed and are negative.  Past Medical History:  Diagnosis Date  . Diabetes mellitus without complication ([place])    History reviewed. No pertinent surgical history. Social History:  reports that she has never smoked. She has never used smokeless tobacco. She reports that she does not drink alcohol or use drugs.  No Known Allergies  Family History  Problem Relation Age of Onset  . Mental illness Sister     Prior to Admission medications   Medication Sig Start Date End Date Taking? Authorizing Provider  benztropine (COGENTIN) 1 MG tablet Take 1 mg by mouth at bedtime. 03/13/18   [provider]  HUMALOG MIX 75/25 KWIKPEN (75-25) 100 UNIT/ML Kwikpen Inject 32-34 Units into the skin See admin instructions. Take 32 units in the evening and 34 units in the morning 01/21/18   [provider]  naproxen (EC NAPROSYN) 500 MG EC tablet Take 1 tablet (500 mg total) by mouth 2 (two) times daily with a meal. Patient not taking: Reported on 04/02/2018 11/14/16   Shelda Pal, DO  risperiDONE (RISPERDAL) 1 MG tablet Take 1 mg by mouth at bedtime. 04/01/18   [provider]  risperiDONE microspheres (RISPERDAL CONSTA) 25 MG injection Inject 37.5 mg into the muscle every 14 (fourteen) days.    [provider]  simvastatin (ZOCOR) 40 MG tablet Take 40 mg by mouth daily at 12 noon.     [provider]    Physical Exam: Vitals:   04/06/18 1059 04/06/18 1101 04/06/18  1518 04/06/18 1555  BP: 102/80   126/85  Pulse: (!) 110   (!) 113  Resp: 16   17  Temp: 98.3 F (36.8 C)     TempSrc: Oral     SpO2: 96%   97%  Weight:  44.9 kg 46.9 kg   Height:  5' (1.524 m)      General: Alert, Awake and Oriented to Time, Place and Person. Appear in mild distress, affect blunted Eyes: PERRL, Conjunctiva normal ENT: Oral Mucosa clear moist Neck: no JVD, no Abnormal  Mass Or lumps Cardiovascular: S1 and S2 Present, no Murmur, Peripheral Pulses Present Respiratory: Bilateral Air entry equal and Decreased, no use of accessory muscle, Clear to Auscultation, no Crackles, no wheezes Abdomen: Bowel Sound present, Soft and no tenderness Skin: redness no, no Rash, no induration Extremities: no Pedal edema, no calf tenderness Neurologic: Grossly no focal neuro deficit. Bilaterally Equal motor strength  Labs:  CBC: Recent Labs  Lab 04/02/18 0941 04/06/18 1225  WBC 7.0 6.9  NEUTROABS  --  4.0  HGB 12.7 12.2  HCT 39.8 39.1  MCV 98.8 101.3*  PLT 423* 903   Basic Metabolic Panel: Recent Labs  Lab 04/02/18 0941 04/06/18 1225  NA 140 140  K 4.0 3.8  CL 108 104  CO2 20* 27  GLUCOSE 308* 76  BUN 14 14  CREATININE 1.09* 0.83  CALCIUM 9.6 9.6   Liver Function Tests: Recent Labs  Lab 04/02/18 0941  AST 30  ALT 26  ALKPHOS 55  BILITOT 0.7  PROT 7.9  ALBUMIN 4.1   No results for input(s): LIPASE, AMYLASE in the last 168 hours. No results for input(s): AMMONIA in the last 168 hours.  Cardiac Enzymes: No results for input(s): CKTOTAL, CKMB, CKMBINDEX, TROPONINI in the last 168 hours. No results for input(s): PROBNP in the last 8760 hours.  CBG: Recent Labs  Lab 04/05/18 1736 04/05/18 2052 04/06/18 0555 04/06/18 1201 04/06/18 1507  GLUCAP 178* 201* 229* 77 160*    Radiological Exams: Ct Angio Chest Pe W And/or Wo Contrast  Result Date: 04/06/2018 CLINICAL DATA:  Shortness of breath, chest pain for several months EXAM: CT ANGIOGRAPHY CHEST WITH CONTRAST TECHNIQUE: Multidetector CT imaging of the chest was performed using the standard protocol during bolus administration of intravenous contrast. Multiplanar CT image reconstructions and MIPs were obtained to evaluate the vascular anatomy. CONTRAST:  120mL ISOVUE-370 IOPAMIDOL (ISOVUE-370) INJECTION 76% COMPARISON:  None FINDINGS: Cardiovascular: Satisfactory opacification of the pulmonary  arteries to the segmental level. Small nonocclusive pulmonary embolus in the right lower lobar pulmonary artery and right lower lobe segmental pulmonary arteries. Pulmonary embolus in the right upper lobe segmental pulmonary arteries. Small pulmonary embolus in the left upper lobe segmental branch. Normal heart size. No pericardial effusion. Mediastinum/Nodes: No enlarged mediastinal, hilar, or axillary lymph nodes. Thyroid gland, trachea, and esophagus demonstrate no significant findings. Lungs/Pleura: Lungs are clear. No pleural effusion or pneumothorax. Upper Abdomen: No acute abnormality. Musculoskeletal: No acute osseous abnormality. No aggressive osseous lesion. Review of the MIP images confirms the above findings. IMPRESSION: 1. Small bilateral pulmonary emboli involving the right lower lobar pulmonary artery, right lower lobe segmental pulmonary artery, right upper lobe segmental pulmonary artery, and left upper lobe segmental pulmonary artery. 2. No focal consolidation. Critical Value/emergent results were called by telephone at the time of interpretation on 04/06/2018 at 2:40 pm to [facility] , who verbally acknowledged these results. Electronically Signed   By: Kathreen Devoid   On: 04/06/2018  14:43    EKG: Independently reviewed. nonspecific ST and T waves changes, sinus tachycardia.  Assessment/Plan 1.  Acute pulmonary embolism.-Provoked Patient presents to the hospital with complaints of sinus tachycardia. During the work-up her d-dimer was elevated. CT scan of the chest was performed which is positive for mild bilateral pulmonary emboli in the right lower lobe, right upper lobe, left upper lobe segmental arteries without it consolidation. No RV strain. Patient is not hypoxic. Patient does have mild sinus tachycardia but no significant orthostatic drop in her blood pressure. Patient denies any dizziness or lightheadedness or chest pain. At this point it is felt that the patient can safely  be treated outpatient with DOAC. Given her history of schizophrenia compliance would be a major issue and therefore single daily dose agent would be appropriate.  Therefore Xarelto was chosen.  I discussed with her grandfather who is the legal guardian who is also agreeable with the plan. I will consider this is a provoked event from her recent stay in the hospital, and patient will probably require a minimum of 3 months of anticoagulation. I will also perform a hypercoagulable panel although utility of that can be questionable in an acute set up. No significant edema identified in the leg. PESI score is 66, which would be classified as low risk and can safely managed outpatient.  EDP is Concern is regarding follow up and outpatient management which will be a challenge in this pt with schizophrenia  In summary the plan is following. Xarelto starter pack-15mg  twice daily for 21 days followed by 20 mg daily. Hypercoagulable panel. Follow-up with PCP in 3 months to discuss discontinuation of anticoagulation. Case management consult to ensure patient has adequate resources for outpatient Xarelto treatment.  2.  Sinus tachycardia. No significant drop in orthostasis. Although heart rate appreciably goes up when the patient stands up. It appears to be multifactorial. Patient has active schizophrenia with signs of psychosis. Patient was on benzodiazepine which is currently on hold. New diagnosis of PE. Patient remains asymptomatic. At present would encourage oral hydration. Would not treat or worry about heart rate as long as patient remains asymptomatic or blood pressure is stable without any antihypertensive medication. We will discontinue Inderal and metoprolol. Continue treatment for schizophrenia and psychosis.  3.  Type 2 diabetes mellitus. Uncontrolled with hyperglycemia. No complication. Hypoglycemia can certainly cause volume loss which can cause tachycardia. Agree with continuing  sliding scale insulin. Continue NovoLog, will increase the dose from 32 units to 34 units twice daily. Carb modified diet recommended.  4.  Schizophrenia. Acute psychosis. Management per behavioral health. Patient to be able to go back to behavioral health for continuation of her treatment, currently medically cleared.  Family Communication: Discussed with patient's grandfather who is the legal power of attorney. Primary team communication: Discussed with the EDP, recommended transition to oral anticoagulation and discharged back to behavioral health.  Addendum: Discussed with psychiatry attending on call for the patient as well, who agrees with the plan.  Berle Mull 5:11 PM 04/06/2018    Addendum: Received a call back from [facility] who mentions that she has talked with their director and refuses to take the patient to behavioral health due to her new PE diagnosis.  Patient will be admitted for observation for schizophrenia.   AC recommends to reconsult psychiatry, and they will determine tomorrow whether the patient is stable for discharge back to [facility] or not.  Berle Mull 6:03 PM 04/06/2018    Thank you very much  for involving [place] in care of your patient.  We will sign off at present, please call us again as needed.  Author: Berle Mull, MD [facility] 04/06/2018 4:24 PM    If 7PM-7AM, please contact night-coverage [email or web address]

## 2018-04-06 NOTE — ED Triage Notes (Signed)
Patient was sent from [facility] for Iv fluids. Patient was accompanied by [facility] staff. CBG- 229 this AM at [facility]. Patient unable to answer questions when asked. Answers to some triage questions not accurate.

## 2018-04-06 NOTE — [facility] Group Notes (Signed)
  [facility] LCSW Group Therapy Note  Date/Time:  04/06/2018 11:15AM-12:00PM  Type of Therapy and Topic:  Group Therapy:  Feelings About Hospitalization  Participation Level:  Did Not Attend   Description of Group This process group involved patients discussing their feelings related to being hospitalized, as well as the benefits they see to being in the hospital.  These feelings and benefits were itemized.  The group then brainstormed specific ways in which they could seek those same benefits when they discharge and return home.  Therapeutic Goals 1. Patient will identify and describe positive and negative feelings related to hospitalization 2. Patient will verbalize benefits of hospitalization to themselves personally 3. Patients will brainstorm together ways they can obtain similar benefits in the outpatient setting, identify barriers to wellness and possible solutions  Summary of Patient Progress:  N/A  Therapeutic Modalities Cognitive Behavioral Therapy Motivational Interviewing    Selmer Dominion, LCSW 04/06/2018, 9:01 AM

## 2018-04-06 NOTE — BH Assessment (Signed)
Chart reviewed regarding return for admission after patient sent to [facility] for elevated heart rate. Noted CT results  patient with positive Pulmonary Embolism . Due to patients multiple co-morbidities,current level of functioning/cognition and medical acuity is currently unable to return to [facility] for safety reasons. Hospitalist report plan to admit for observation.

## 2018-04-06 NOTE — ED Provider Notes (Addendum)
[place] DEPT Provider Note   CSN: [id number] Arrival date & time: 04/06/18  1051    History   Chief Complaint Chief Complaint  Patient presents with  . Sent from [facility] for Iv fluids    HPI Debbie Dorsey is a 48 y.o. female.     HPI  66 of information for today's visit concern chart review.  Patient was evaluated 2 days ago in the emergency department after an episode of psychosis.  She was admitted to behavioral health Hospital.  While there, she consistently had sinus tachycardia, appearing to be in no acute distress.  Patient adjustments in her medications by behavioral health team including change from propanolol to metoprolol, as well as adjustments in her insulin.  Unclear why patient is on propanolol.  She does not know what medicine she is on or why she takes them.  When asked about her symptoms patient answers "yes" for all questions.  When asked if she experiences palpitations, patient states "yes, when something is scary" Chart revealed no history of DVT/PE, hormone use, or recent cancer treatment.  Level 5 caveat psychosis.  Patient unreliable historian.  Past Medical History:  Diagnosis Date  . Diabetes mellitus without complication [facility])     Patient Active Problem List   Diagnosis Date Noted  . Schizophrenia ([place]) 04/02/2018    History reviewed. No pertinent surgical history.   OB History    Gravida  1   Para      Term      Preterm      AB      Living        SAB      TAB      Ectopic      Multiple      Live Births               Home Medications    Prior to Admission medications   Medication Sig Start Date End Date Taking? Authorizing Provider  benztropine (COGENTIN) 1 MG tablet Take 1 mg by mouth at bedtime. 03/13/18   [provider]  HUMALOG MIX 75/25 KWIKPEN (75-25) 100 UNIT/ML Kwikpen Inject 32-34 Units into the skin See admin instructions. Take 32 units in the evening and 34  units in the morning 01/21/18   [provider]  naproxen (EC NAPROSYN) 500 MG EC tablet Take 1 tablet (500 mg total) by mouth 2 (two) times daily with a meal. Patient not taking: Reported on 04/02/2018 11/14/16   Shelda Pal, DO  risperiDONE (RISPERDAL) 1 MG tablet Take 1 mg by mouth at bedtime. 04/01/18   [provider]  risperiDONE microspheres (RISPERDAL CONSTA) 25 MG injection Inject 37.5 mg into the muscle every 14 (fourteen) days.    [provider]  simvastatin (ZOCOR) 40 MG tablet Take 40 mg by mouth daily at 12 noon.     [provider]    Family History History reviewed. No pertinent family history.  Social History Social History   Tobacco Use  . Smoking status: Never Smoker  . Smokeless tobacco: Never Used  Substance Use Topics  . Alcohol use: No  . Drug use: No     Allergies   Patient has no known allergies.   Review of Systems Review of Systems  Cardiovascular: Positive for palpitations. Negative for chest pain.   Level 5 caveat psychosis.   Physical Exam Updated Vital Signs BP 102/80 (BP Location: Left Arm)   Pulse (!) 110  Temp 98.3 F (36.8 C) (Oral)   Resp 16   Ht 5' (1.524 m)   Wt 44.9 kg   LMP  (LMP Unknown)   SpO2 96%   BMI 19.33 kg/m   Physical Exam Vitals signs and nursing note reviewed.  Constitutional:      General: She is not in acute distress.    Appearance: She is well-developed. She is not ill-appearing.     Comments: Pleasant, smiling, and in no acute distress.  HENT:     Head: Normocephalic and atraumatic.     Mouth/Throat:     Mouth: Mucous membranes are moist.  Eyes:     Conjunctiva/sclera: Conjunctivae normal.     Pupils: Pupils are equal, round, and reactive to light.  Neck:     Musculoskeletal: Normal range of motion and neck supple.  Cardiovascular:     Rate and Rhythm: Regular rhythm. Tachycardia present.     Heart sounds: S1 normal and S2 normal. No murmur.      Comments: Slight tachycardia on my exam just above 100. No lower extremity edema.  No calf tenderness. Pulmonary:     Effort: Pulmonary effort is normal.     Breath sounds: Normal breath sounds. No wheezing or rales.  Abdominal:     General: There is no distension.     Palpations: Abdomen is soft.     Tenderness: There is no abdominal tenderness. There is no guarding.  Musculoskeletal: Normal range of motion.        General: No deformity.  Lymphadenopathy:     Cervical: No cervical adenopathy.  Skin:    General: Skin is warm and dry.     Findings: No erythema or rash.  Neurological:     Mental Status: She is alert.     Comments: Cranial nerves grossly intact. Patient moves extremities symmetrically and with good coordination.  Psychiatric:        Behavior: Behavior normal.        Thought Content: Thought content normal.        Judgment: Judgment normal.      ED Treatments / Results  Labs (all labs ordered are listed, but only abnormal results are displayed) Labs Reviewed  GLUCOSE, CAPILLARY - Abnormal; Notable for the following components:      Result Value   Glucose-Capillary 254 (*)    All other components within normal limits  GLUCOSE, CAPILLARY - Abnormal; Notable for the following components:   Glucose-Capillary 122 (*)    All other components within normal limits  GLUCOSE, CAPILLARY - Abnormal; Notable for the following components:   Glucose-Capillary 478 (*)    All other components within normal limits  GLUCOSE, CAPILLARY - Abnormal; Notable for the following components:   Glucose-Capillary 285 (*)    All other components within normal limits  GLUCOSE, CAPILLARY - Abnormal; Notable for the following components:   Glucose-Capillary 212 (*)    All other components within normal limits  GLUCOSE, CAPILLARY - Abnormal; Notable for the following components:   Glucose-Capillary 325 (*)    All other components within normal limits  GLUCOSE, CAPILLARY - Abnormal;  Notable for the following components:   Glucose-Capillary 285 (*)    All other components within normal limits  GLUCOSE, CAPILLARY - Abnormal; Notable for the following components:   Glucose-Capillary 383 (*)    All other components within normal limits  GLUCOSE, CAPILLARY - Abnormal; Notable for the following components:   Glucose-Capillary 277 (*)    All other components  within normal limits  GLUCOSE, CAPILLARY - Abnormal; Notable for the following components:   Glucose-Capillary 234 (*)    All other components within normal limits  GLUCOSE, CAPILLARY - Abnormal; Notable for the following components:   Glucose-Capillary 194 (*)    All other components within normal limits  GLUCOSE, CAPILLARY - Abnormal; Notable for the following components:   Glucose-Capillary 134 (*)    All other components within normal limits  GLUCOSE, CAPILLARY - Abnormal; Notable for the following components:   Glucose-Capillary 178 (*)    All other components within normal limits  GLUCOSE, CAPILLARY - Abnormal; Notable for the following components:   Glucose-Capillary 201 (*)    All other components within normal limits  GLUCOSE, CAPILLARY - Abnormal; Notable for the following components:   Glucose-Capillary 229 (*)    All other components within normal limits  CBC WITH DIFFERENTIAL/PLATELET - Abnormal; Notable for the following components:   RBC 3.86 (*)    MCV 101.3 (*)    All other components within normal limits  D-DIMER, QUANTITATIVE (NOT AT [facility]) - Abnormal; Notable for the following components:   D-Dimer, Quant 9.19 (*)    All other components within normal limits  TSH  BASIC METABOLIC PANEL  CBG MONITORING, ED  CBG MONITORING, ED  I-STAT TROPONIN, ED    EKG None   ED ECG REPORT   Date: 04/06/2018  Rate: 154  Rhythm: sinus tachycardia  QRS Axis: normal  Intervals: normal  ST/T Wave abnormalities: normal  Conduction Disutrbances:none  Narrative Interpretation:   Old EKG Reviewed:  unchanged  I have personally reviewed the EKG tracing and agree with the computerized printout as noted.   Radiology Ct Angio Chest Pe W And/or Wo Contrast  Result Date: 04/06/2018 CLINICAL DATA:  Shortness of breath, chest pain for several months EXAM: CT ANGIOGRAPHY CHEST WITH CONTRAST TECHNIQUE: Multidetector CT imaging of the chest was performed using the standard protocol during bolus administration of intravenous contrast. Multiplanar CT image reconstructions and MIPs were obtained to evaluate the vascular anatomy. CONTRAST:  1108mL ISOVUE-370 IOPAMIDOL (ISOVUE-370) INJECTION 76% COMPARISON:  None FINDINGS: Cardiovascular: Satisfactory opacification of the pulmonary arteries to the segmental level. Small nonocclusive pulmonary embolus in the right lower lobar pulmonary artery and right lower lobe segmental pulmonary arteries. Pulmonary embolus in the right upper lobe segmental pulmonary arteries. Small pulmonary embolus in the left upper lobe segmental branch. Normal heart size. No pericardial effusion. Mediastinum/Nodes: No enlarged mediastinal, hilar, or axillary lymph nodes. Thyroid gland, trachea, and esophagus demonstrate no significant findings. Lungs/Pleura: Lungs are clear. No pleural effusion or pneumothorax. Upper Abdomen: No acute abnormality. Musculoskeletal: No acute osseous abnormality. No aggressive osseous lesion. Review of the MIP images confirms the above findings. IMPRESSION: 1. Small bilateral pulmonary emboli involving the right lower lobar pulmonary artery, right lower lobe segmental pulmonary artery, right upper lobe segmental pulmonary artery, and left upper lobe segmental pulmonary artery. 2. No focal consolidation. Critical Value/emergent results were called by telephone at the time of interpretation on 04/06/2018 at 2:40 pm to [facility] , who verbally acknowledged these results. Electronically Signed   By: Kathreen Devoid   On: 04/06/2018 14:43    Procedures Procedures  (including critical care time)  CRITICAL CARE Performed by: Albesa Seen   Total critical care time: 35 minutes  Critical care time was exclusive of separately billable procedures and treating other patients.  Critical care was necessary to treat or prevent imminent or life-threatening deterioration.  Critical care was time  spent personally by me on the following activities: development of treatment plan with patient and/or surrogate as well as nursing, discussions with consultants, evaluation of patient's response to treatment, examination of patient, obtaining history from patient or surrogate, ordering and performing treatments and interventions, ordering and review of laboratory studies, ordering and review of radiographic studies, pulse oximetry and re-evaluation of patient's condition.   Medications Ordered in ED Medications  OLANZapine zydis (ZYPREXA) disintegrating tablet 10 mg (10 mg Oral Given 04/02/18 2140)    And  LORazepam (ATIVAN) tablet 1 mg (1 mg Oral Given 04/02/18 2220)    And  ziprasidone (GEODON) injection 20 mg (has no administration in time range)  insulin aspart (novoLOG) injection 0-15 Units (0 Units Subcutaneous Not Given 04/06/18 1226)  insulin aspart (novoLOG) injection 0-5 Units (2 Units Subcutaneous Given 04/05/18 2122)  benztropine (COGENTIN) tablet 0.5 mg (0.5 mg Oral Given 04/06/18 0808)  temazepam (RESTORIL) capsule 15 mg (has no administration in time range)  prenatal multivitamin tablet 1 tablet (1 tablet Oral Given 04/05/18 1208)  feeding supplement (GLUCERNA SHAKE) (GLUCERNA SHAKE) liquid 237 mL (237 mLs Oral Given 04/06/18 0847)  insulin aspart protamine- aspart (NOVOLOG MIX 70/30) injection 32 Units (has no administration in time range)  metoprolol tartrate (LOPRESSOR) tablet 12.5 mg (12.5 mg Oral Not Given 04/06/18 1056)  risperiDONE (RISPERDAL) tablet 2 mg (has no administration in time range)  risperiDONE (RISPERDAL) tablet 3 mg (has no  administration in time range)  sodium chloride (PF) 0.9 % injection (has no administration in time range)  iopamidol (ISOVUE-370) 76 % injection (has no administration in time range)  sodium chloride 0.9 % bolus 1,000 mL (1,000 mLs Intravenous New Bag/Given 04/06/18 1221)  iopamidol (ISOVUE-370) 76 % injection 100 mL (100 mLs Intravenous Contrast Given 04/06/18 1353)     Initial Impression / Assessment and Plan / ED Course  I have reviewed the triage vital signs and the nursing notes.  Pertinent labs & imaging results that were available during my care of the patient were reviewed by me and considered in my medical decision making (see chart for details).  Clinical Course as of Feb 29 1449  Sat Apr 06, 2018  1318 Improved from 2 days ago. No evidence of AKI.   Creatinine: 0.83 [AM]  1444 Received call from radiology that patient has bilateral pulmonary emboli, 1 lobar, and multiple subsegmental.  No right heart strain.  Will check troponin, and start heparin.  Will admit to hospital medicine.   [AM]  1444 Pt eating to prevent hypoglycemia.   Glucose-Capillary: 84 [AM]    Clinical Course User Index [AM] Albesa Seen, PA-C       Patient is nontoxic-appearing, hemodynamically stable, and in no acute distress.  She does not appear short of breath or to be in discomfort.  On my exam, heart rate is around 100.  Patient not agitated.  Chart review shows that patient has few emergency department visits.  Patient did have tachycardia 1 presenting for a headache in 2018.  She does not appear clinically dehydrated.  Lab work from 2 days ago demonstrates no significant AKI.  Patient had normal TSH 2 days ago.  Doubt thyroid storm as cause of patient's tachycardia.  Patient's CBG is 229, does not appear significantly volume bleeding on exam.  Will check electrolytes again, assess anion gap, check for leukocytosis.  No fever or focal infectious symptoms.  Will check a d-dimer.  If work-up is  negative, anticipate return to [place], with  outpatient follow-up for sinus tachycardia.  Patient with multiple pulmonary emboli, subsegmental.  No CT evidence of right heart strain.  Given the extent of patient's tachycardia, will start heparin and consult to hospital medicine.  Case discussed with Dr. Posey Pronto of [facility].  He will consult on patient, and discuss with [facility] if patient could be safely discharged.  Appreciate his involvement.  Dispo pending discussion with [place] per Dr. Posey Pronto.   5:10 PM In the event that patient goes back to behavioral health today, in order for Xarelto 15 mg twice daily for 21 days is placed in the chart per the recommendations of Dr. Posey Pronto.   This is a supervised visit with Dr. Gareth Morgan. Evaluation, management, and discharge planning discussed with this attending physician.  Final Clinical Impressions(s) / ED Diagnoses   Final diagnoses:  Multiple subsegmental pulmonary emboli without acute cor pulmonale  Tachycardia    ED Discharge Orders    None       Albesa Seen, PA-C 04/06/18 1701    [address] 04/06/18 1809    Gareth Morgan, MD 04/06/18 2104

## 2018-04-06 NOTE — Progress Notes (Signed)
1:1 Note Pt observed sleeping sleeping with even and unlabored respiration. No problems noted. Pt continue to be on 1:1 obs for safety, will continue to monitor.

## 2018-04-06 NOTE — ED Notes (Signed)
Bed: WLPT1 Expected date:  Expected time:  Means of arrival:  Comments: 

## 2018-04-07 ENCOUNTER — Encounter (HOSPITAL_COMMUNITY): Payer: Self-pay | Admitting: *Deleted

## 2018-04-07 DIAGNOSIS — F200 Paranoid schizophrenia: Secondary | ICD-10-CM | POA: Diagnosis not present

## 2018-04-07 DIAGNOSIS — R440 Auditory hallucinations: Secondary | ICD-10-CM | POA: Diagnosis not present

## 2018-04-07 LAB — GLUCOSE, CAPILLARY
Glucose-Capillary: 363 mg/dL — ABNORMAL HIGH (ref 70–99)
Glucose-Capillary: 280 mg/dL — ABNORMAL HIGH (ref 70–99)
Glucose-Capillary: 134 mg/dL — ABNORMAL HIGH (ref 70–99)
Glucose-Capillary: 123 mg/dL — ABNORMAL HIGH (ref 70–99)

## 2018-04-07 LAB — ANTITHROMBIN III: AntiThromb III Func: 114 % (ref 75–120)

## 2018-04-07 NOTE — Plan of Care (Signed)
Plan of care 

## 2018-04-07 NOTE — Progress Notes (Signed)
PROGRESS NOTE    Debbie Dorsey  PYK:[id number] DOB: Aug 21, 1970 DOA: 04/06/2018 PCP: Patient, No Pcp Per   Brief Narrative: Patient is a 48 year old female with history of schizophrenia, type 2 diabetes mellitus who was sent to the emergency department from psychiatric unit.  She was initially admitted there on 04/02/2018 after she started having paranoia and auditory and visual hallucination.  Patient was found to be in persistent sinus tachycardia and was sent to the emergency department.  CT chest done in the emergency department showed a small bilateral pulmonary embolism.  Started on Xarelto. She is medically cleared to be transfered to behavioral health unit as soon as possible.  Assessment & Plan:   Active Problems:   Schizophrenia ([place])  Acute pulmonary embolism: CT chest showed bilateral small pulmonary emboli.  Started on Xarelto.  Initially sent to emergency department from psychiatric unit for sinus tachycardia.  D-dimer was elevated.  No right ventricular strain.  Patient is not hypoxic and saturating fine on room air. She does not have lower extremity edema.  Pulmonary embolism was considered to be provoked during her recent hospital stay at psychiatric unit.  Continue anticoagulation for at least 3 to 6 months.  No history of hypercoagulability in the past.  Hypercoagulable profile has been sent.  Sinus tachycardia: Much improved.  Anticipate spontaneous resolution.  Beta blocker discontinued.  Diabetes mellitus: Looks well controlled.Continue current insulin regimen.  She should continue on her previous regimen on discharge.  Schizophrenia: Currently being treated for acute psychosis at psychiatric unit.  She should be sent back to the psychiatric unit as soon as possible. Also requested psychiatric consultation for clearance for transfer.           DVT prophylaxis: Xarelto Code Status: Full Family Communication: None present at the bedside Disposition Plan:  Psychiatric unit as soon as possible   Consultants: None  Procedures: None  Antimicrobials:  Anti-infectives (From admission, onward)   None      Subjective: Patient seen and examined the bedside this morning.  Looks comfortable.  Hemodynamically stable.  Says she cannot breathe but she saturating fine on room air.  Denies any chest pain.  Has tangential thoughts.  Talks irreverently , not oriented and repeats same words  Objective: Vitals:   04/06/18 1935 04/07/18 0503  BP: 98/73 104/78  Pulse: (!) 106 (!) 107  Resp: 17   Temp:  98.7 F (37.1 C)  TempSrc:  Oral  SpO2: 98% 100%    Intake/Output Summary (Last 24 hours) at 04/07/2018 1038 Last data filed at 04/07/2018 0819 Gross per 24 hour  Intake 930 ml  Output -  Net 930 ml   There were no vitals filed for this visit.  Examination:  General exam: Appears calm and comfortable ,Not in distress,average built HEENT:PERRL,Oral mucosa moist, Ear/Nose normal on gross exam Respiratory system: Bilateral equal air entry, normal vesicular breath sounds, no wheezes or crackles  Cardiovascular system: Sinus tachycardia, RRR. No JVD, murmurs, rubs, gallops or clicks. No pedal edema. Gastrointestinal system: Abdomen is nondistended, soft and nontender. No organomegaly or masses felt. Normal bowel sounds heard. Central nervous system: Alert but not  oriented. No focal neurological deficits. Extremities: No edema, no clubbing ,no cyanosis, distal peripheral pulses palpable. Skin: No rashes, lesions or ulcers,no icterus ,no pallor MSK: Normal muscle bulk,tone ,power Psychiatry: Judgement and insight appear impaired   Data Reviewed: I have personally reviewed following labs and imaging studies  CBC: Recent Labs  Lab 04/02/18 0941 04/06/18 1225  WBC 7.0 6.9  NEUTROABS  --  4.0  HGB 12.7 12.2  HCT 39.8 39.1  MCV 98.8 101.3*  PLT 423* 644   Basic Metabolic Panel: Recent Labs  Lab 04/02/18 0941 04/06/18 1225  NA 140 140    K 4.0 3.8  CL 108 104  CO2 20* 27  GLUCOSE 308* 76  BUN 14 14  CREATININE 1.09* 0.83  CALCIUM 9.6 9.6   GFR: Estimated Creatinine Clearance: 60.8 mL/min (by C-G formula based on SCr of 0.83 mg/dL). Liver Function Tests: Recent Labs  Lab 04/02/18 0941  AST 30  ALT 26  ALKPHOS 55  BILITOT 0.7  PROT 7.9  ALBUMIN 4.1   No results for input(s): LIPASE, AMYLASE in the last 168 hours. No results for input(s): AMMONIA in the last 168 hours. Coagulation Profile: No results for input(s): INR, PROTIME in the last 168 hours. Cardiac Enzymes: No results for input(s): CKTOTAL, CKMB, CKMBINDEX, TROPONINI in the last 168 hours. BNP (last 3 results) No results for input(s): PROBNP in the last 8760 hours. HbA1C: No results for input(s): HGBA1C in the last 72 hours. CBG: Recent Labs  Lab 04/06/18 1201 04/06/18 1507 04/06/18 1733 04/06/18 2055 04/07/18 0737  GLUCAP 77 160* 151* 313* 363*   Lipid Profile: No results for input(s): CHOL, HDL, LDLCALC, TRIG, CHOLHDL, LDLDIRECT in the last 72 hours. Thyroid Function Tests: Recent Labs    04/04/18 1849  TSH 1.072   Anemia Panel: No results for input(s): VITAMINB12, FOLATE, FERRITIN, TIBC, IRON, RETICCTPCT in the last 72 hours. Sepsis Labs: No results for input(s): PROCALCITON, LATICACIDVEN in the last 168 hours.  No results found for this or any previous visit (from the past 240 hour(s)).       Radiology Studies: Ct Angio Chest Pe W And/or Wo Contrast  Result Date: 04/06/2018 CLINICAL DATA:  Shortness of breath, chest pain for several months EXAM: CT ANGIOGRAPHY CHEST WITH CONTRAST TECHNIQUE: Multidetector CT imaging of the chest was performed using the standard protocol during bolus administration of intravenous contrast. Multiplanar CT image reconstructions and MIPs were obtained to evaluate the vascular anatomy. CONTRAST:  168mL ISOVUE-370 IOPAMIDOL (ISOVUE-370) INJECTION 76% COMPARISON:  None FINDINGS: Cardiovascular:  Satisfactory opacification of the pulmonary arteries to the segmental level. Small nonocclusive pulmonary embolus in the right lower lobar pulmonary artery and right lower lobe segmental pulmonary arteries. Pulmonary embolus in the right upper lobe segmental pulmonary arteries. Small pulmonary embolus in the left upper lobe segmental branch. Normal heart size. No pericardial effusion. Mediastinum/Nodes: No enlarged mediastinal, hilar, or axillary lymph nodes. Thyroid gland, trachea, and esophagus demonstrate no significant findings. Lungs/Pleura: Lungs are clear. No pleural effusion or pneumothorax. Upper Abdomen: No acute abnormality. Musculoskeletal: No acute osseous abnormality. No aggressive osseous lesion. Review of the MIP images confirms the above findings. IMPRESSION: 1. Small bilateral pulmonary emboli involving the right lower lobar pulmonary artery, right lower lobe segmental pulmonary artery, right upper lobe segmental pulmonary artery, and left upper lobe segmental pulmonary artery. 2. No focal consolidation. Critical Value/emergent results were called by telephone at the time of interpretation on 04/06/2018 at 2:40 pm to [facility] , who verbally acknowledged these results. Electronically Signed   By: Kathreen Devoid   On: 04/06/2018 14:43        Scheduled Meds: . benztropine  0.5 mg Oral BID  . feeding supplement (GLUCERNA SHAKE)  237 mL Oral TID BM  . insulin aspart  0-15 Units Subcutaneous TID WC  . insulin aspart  0-5 Units  Subcutaneous QHS  . insulin aspart protamine- aspart  32 Units Subcutaneous BID WC  . prenatal multivitamin  1 tablet Oral Q1200  . risperiDONE  2 mg Oral BID  . rivaroxaban  15 mg Oral BID WC  . [START ON 04/28/2018] rivaroxaban  20 mg Oral Daily   Continuous Infusions:   LOS: 0 days    Time spent: 35 mins.More than 50% of that time was spent in counseling and/or coordination of care.      Shelly Coss, MD [facility] Pager  [phone]  If 7PM-7AM, please contact night-coverage [email or web address] Password [facility] 04/07/2018, 10:38 AM

## 2018-04-07 NOTE — Consult Note (Signed)
[facility] Face-to-Face Psychiatry Consult   Reason for Consult:  ''acute psychosis, needs to return back to behavior health.'' Referring Physician:  Dr. Tawanna Solo Patient Identification: Debbie Dorsey MRN:  [id number] Principal Diagnosis: Paranoid schizophrenia ([place]) Diagnosis:  Principal Problem:   Paranoid schizophrenia ([place])   Total Time spent with patient: 45 minutes  Subjective:   Debbie Dorsey is a 48 y.o. female patient admitted to the the medical floor due to tachycardia but CT chest revealed a small bilateral pulmonary embolism.  HPI: Patient who is a poor historian but per chart, has history of Schizophrenia. Patient is too disorganized to give a coherent history. Apparently, she was recently admitted to [facility] due to auditory hallucinations, was hearing voices telling her "to kill her self".  She was transferred to [facility] long ED due to tachycardia.Currently, patient is paranoid and psychotic. She reports that people are out to get her in the community where she lives and still hearing voices telling her to hurt herself. She has a disorganized speech, thought process, thought blocking and flat affects. In addition, she states that some imaginary people are trying to assault her in her hospital bed.  Past Psychiatric History: as above  Risk to Self:  denies Risk to Others:  denies Prior Inpatient Therapy:  [facility] Prior Outpatient Therapy:  [facility]  Past Medical History:  Past Medical History:  Diagnosis Date  . Diabetes mellitus without complication ([place])    No past surgical history on file. Family History:  Family History  Problem Relation Age of Onset  . Mental illness Sister    Family Psychiatric  History:  Social History:  Social History   Substance and Sexual Activity  Alcohol Use No     Social History   Substance and Sexual Activity  Drug Use No    Social History   Socioeconomic History  . Marital status: Single    Spouse name: Not on file  . Number of  children: Not on file  . Years of education: Not on file  . Highest education level: Not on file  Occupational History  . Not on file  Social Needs  . Financial resource strain: Not on file  . Food insecurity:    Worry: Not on file    Inability: Not on file  . Transportation needs:    Medical: Not on file    Non-medical: Not on file  Tobacco Use  . Smoking status: Never Smoker  . Smokeless tobacco: Never Used  Substance and Sexual Activity  . Alcohol use: No  . Drug use: No  . Sexual activity: Not on file  Lifestyle  . Physical activity:    Days per week: Not on file    Minutes per session: Not on file  . Stress: Not on file  Relationships  . Social connections:    Talks on phone: Not on file    Gets together: Not on file    Attends religious service: Not on file    Active member of club or organization: Not on file    Attends meetings of clubs or organizations: Not on file    Relationship status: Not on file  Other Topics Concern  . Not on file  Social History Narrative  . Not on file   Additional Social History:    Allergies:  No Known Allergies  Labs:  Results for orders placed or performed during the hospital encounter of 04/06/18 (from the past 48 hour(s))  Glucose, capillary     Status:  Abnormal   Collection Time: 04/06/18  8:55 PM  Result Value Ref Range   Glucose-Capillary 313 (H) 70 - 99 mg/dL  Glucose, capillary     Status: Abnormal   Collection Time: 04/07/18  7:37 AM  Result Value Ref Range   Glucose-Capillary 363 (H) 70 - 99 mg/dL   Comment 1 Notify RN     Current Facility-Administered Medications  Medication Dose Route Frequency Provider Last Rate Last Dose  . acetaminophen (TYLENOL) tablet 650 mg  650 mg Oral Q6H PRN Lavina Hamman, MD       Or  . acetaminophen (TYLENOL) suppository 650 mg  650 mg Rectal Q6H PRN Lavina Hamman, MD      . benztropine (COGENTIN) tablet 0.5 mg  0.5 mg Oral BID Lavina Hamman, MD   0.5 mg at 04/07/18 1051  .  feeding supplement (GLUCERNA SHAKE) (GLUCERNA SHAKE) liquid 237 mL  237 mL Oral TID BM Lavina Hamman, MD      . insulin aspart (novoLOG) injection 0-15 Units  0-15 Units Subcutaneous TID WC Lavina Hamman, MD   15 Units at 04/07/18 0801  . insulin aspart (novoLOG) injection 0-5 Units  0-5 Units Subcutaneous QHS Lavina Hamman, MD   4 Units at 04/06/18 2356  . insulin aspart protamine- aspart (NOVOLOG MIX 70/30) injection 32 Units  32 Units Subcutaneous BID WC Lavina Hamman, MD   32 Units at 04/07/18 0801  . LORazepam (ATIVAN) tablet 0.5 mg  0.5 mg Oral BID PRN Lavina Hamman, MD      . ondansetron [facility]) tablet 4 mg  4 mg Oral Q6H PRN Lavina Hamman, MD       Or  . ondansetron [facility]) injection 4 mg  4 mg Intravenous Q6H PRN Lavina Hamman, MD      . prenatal multivitamin tablet 1 tablet  1 tablet Oral Q1200 Lavina Hamman, MD   1 tablet at 04/07/18 1051  . risperiDONE (RISPERDAL) tablet 2 mg  2 mg Oral BID Lavina Hamman, MD   2 mg at 04/07/18 1051  . Rivaroxaban (XARELTO) tablet 15 mg  15 mg Oral BID WC Lavina Hamman, MD   15 mg at 04/07/18 0802  . [START ON 04/28/2018] rivaroxaban (XARELTO) tablet 20 mg  20 mg Oral Daily Lavina Hamman, MD      . temazepam (RESTORIL) capsule 15 mg  15 mg Oral QHS PRN Lavina Hamman, MD        Musculoskeletal: Strength & Muscle Tone: within normal limits Gait & Station: unsteady Patient leans: N/A  Psychiatric Specialty Exam: Physical Exam  Psychiatric: Judgment normal. Her mood appears anxious. Her speech is tangential. She is actively hallucinating. Thought content is paranoid and delusional. Cognition and memory are normal.    Review of Systems  Constitutional: Negative.   HENT: Negative.   Eyes: Negative.   Respiratory: Negative.   Cardiovascular: Negative.   Gastrointestinal: Negative.   Genitourinary: Negative.   Musculoskeletal: Negative.   Skin: Negative.   Endo/Heme/Allergies: Negative.   Psychiatric/Behavioral: Positive  for hallucinations. The patient is nervous/anxious.     Blood pressure 104/78, pulse (!) 107, temperature 98.7 F (37.1 C), temperature source Oral, resp. rate 17, SpO2 100 %, unknown if currently breastfeeding.There is no height or weight on file to calculate BMI.  General Appearance: Casual  Eye Contact:  Minimal  Speech:  Blocked and Slow  Volume:  Decreased  Mood:  Dysphoric  Affect:  Flat  Thought Process:  Disorganized  Orientation:  Other:  only to person and place  Thought Content:  Delusions, Hallucinations: Auditory and Tangential  Suicidal Thoughts:  No  Homicidal Thoughts:  No  Memory:  Immediate;   Fair Recent;   Poor Remote;   Poor  Judgement:  Impaired  Insight:  Lacking  Psychomotor Activity:  Restlessness  Concentration:  Concentration: Fair and Attention Span: Fair  Recall:  [employer] of Knowledge:  Fair  Language:  Fair  Akathisia:  No  Handed:  Right  AIMS (if indicated):     Assets:  Desire for Improvement  ADL's:  Intact  Cognition:  WNL  Sleep:   fair     Treatment Plan Summary:  48 year old female with history of Schizophrenia, type 2 diabetes mellitus who developed tachycardia and was sent to [facility] from [facility]  for medical clearance. Patient remains psychotic, delusional with disorganized speech and thought process.  Recommendations: -Continue 1:1 sitter for safety -Patient will benefit from admission to a psychiatric unit for stabilization after she is medically cleared. - Continue Risperdal 2 mg bid for psychosis/delusions. -Discontinue PRN Olanzapine-patient with uncontrolled diabetes. -Psychiatric consult unit signing off. Re-consult as needed.  Disposition: Recommend psychiatric Inpatient admission when medically cleared.  Corena Pilgrim, MD 04/07/2018 11:48 AM

## 2018-04-08 ENCOUNTER — Encounter (HOSPITAL_COMMUNITY): Payer: Self-pay | Admitting: *Deleted

## 2018-04-08 ENCOUNTER — Inpatient Hospital Stay (HOSPITAL_COMMUNITY)
Admission: AD | Admit: 2018-04-08 | Discharge: 2018-04-09 | DRG: 885 | Disposition: A | Discharge status: Home / Self Care | Payer: Medicare Other | Source: Intra-hospital | Attending: Psychiatry | Admitting: Psychiatry

## 2018-04-08 ENCOUNTER — Other Ambulatory Visit: Payer: Self-pay

## 2018-04-08 DIAGNOSIS — Z79899 Other long term (current) drug therapy: Secondary | ICD-10-CM

## 2018-04-08 DIAGNOSIS — E119 Type 2 diabetes mellitus without complications: Secondary | ICD-10-CM | POA: Diagnosis present

## 2018-04-08 DIAGNOSIS — Z86711 Personal history of pulmonary embolism: Secondary | ICD-10-CM | POA: Diagnosis not present

## 2018-04-08 DIAGNOSIS — Z818 Family history of other mental and behavioral disorders: Secondary | ICD-10-CM | POA: Diagnosis not present

## 2018-04-08 DIAGNOSIS — F209 Schizophrenia, unspecified: Principal | ICD-10-CM | POA: Diagnosis present

## 2018-04-08 DIAGNOSIS — Z915 Personal history of self-harm: Secondary | ICD-10-CM

## 2018-04-08 DIAGNOSIS — R45851 Suicidal ideations: Secondary | ICD-10-CM | POA: Diagnosis present

## 2018-04-08 DIAGNOSIS — Z7901 Long term (current) use of anticoagulants: Secondary | ICD-10-CM | POA: Diagnosis not present

## 2018-04-08 DIAGNOSIS — F200 Paranoid schizophrenia: Secondary | ICD-10-CM | POA: Diagnosis not present

## 2018-04-08 DIAGNOSIS — Z794 Long term (current) use of insulin: Secondary | ICD-10-CM

## 2018-04-08 DIAGNOSIS — F29 Unspecified psychosis not due to a substance or known physiological condition: Secondary | ICD-10-CM | POA: Diagnosis present

## 2018-04-08 DIAGNOSIS — G4700 Insomnia, unspecified: Secondary | ICD-10-CM | POA: Diagnosis present

## 2018-04-08 DIAGNOSIS — F329 Major depressive disorder, single episode, unspecified: Secondary | ICD-10-CM | POA: Diagnosis present

## 2018-04-08 LAB — HOMOCYSTEINE: HOMOCYSTEINE-NORM: 6.4 umol/L (ref 0.0–14.5)

## 2018-04-08 LAB — LUPUS ANTICOAGULANT PANEL
DRVVT: 35 s (ref 0.0–47.0)
PTT Lupus Anticoagulant: 26.2 s (ref 0.0–51.9)

## 2018-04-08 LAB — GLUCOSE, CAPILLARY
Glucose-Capillary: 177 mg/dL — ABNORMAL HIGH (ref 70–99)
Glucose-Capillary: 318 mg/dL — ABNORMAL HIGH (ref 70–99)
Glucose-Capillary: 183 mg/dL — ABNORMAL HIGH (ref 70–99)
Glucose-Capillary: 106 mg/dL — ABNORMAL HIGH (ref 70–99)

## 2018-04-08 LAB — PROTEIN S, TOTAL: PROTEIN S AG TOTAL: 94 % (ref 60–150)

## 2018-04-08 LAB — PROTEIN S ACTIVITY: Protein S Activity: 99 % (ref 63–140)

## 2018-04-08 LAB — PROTEIN C ACTIVITY: Protein C Activity: 141 % (ref 73–180)

## 2018-04-08 MED ORDER — RISPERIDONE 2 MG PO TABS: 2.0000 mg | ORAL_TABLET | Freq: Two times a day (BID) | ORAL | Status: DC

## 2018-04-08 MED ORDER — RIVAROXABAN 15 MG PO TABS
15.0000 mg | ORAL_TABLET | Freq: Two times a day (BID) | ORAL | Status: DC
  Administered 2018-04-08 – 2018-04-09 (×2): 15 mg via ORAL
  Filled 2018-04-08 (×7): qty 1

## 2018-04-08 MED ORDER — INSULIN ASPART 100 UNIT/ML ~~LOC~~ SOLN: 0.0000 [IU] | Freq: Every day | SUBCUTANEOUS | Status: DC

## 2018-04-08 MED ORDER — ALUM & MAG HYDROXIDE-SIMETH 200-200-20 MG/5ML PO SUSP: 30.0000 mL | ORAL | Status: DC | PRN

## 2018-04-08 MED ORDER — INSULIN ASPART PROT & ASPART (70-30 MIX) 100 UNIT/ML ~~LOC~~ SUSP
32.0000 [IU] | Freq: Two times a day (BID) | SUBCUTANEOUS | Status: DC
  Administered 2018-04-08 – 2018-04-09 (×2): 32 [IU] via SUBCUTANEOUS

## 2018-04-08 MED ORDER — RISPERIDONE 2 MG PO TABS
2.0000 mg | ORAL_TABLET | Freq: Every day | ORAL | Status: DC
  Administered 2018-04-09: 2 mg via ORAL
  Filled 2018-04-08 (×3): qty 1

## 2018-04-08 MED ORDER — GLUCERNA SHAKE PO LIQD
237.0000 mL | Freq: Three times a day (TID) | ORAL | Status: DC
  Administered 2018-04-08: 237 mL via ORAL

## 2018-04-08 MED ORDER — PRENATAL MULTIVITAMIN CH
1.0000 | ORAL_TABLET | Freq: Every day | ORAL | Status: DC
  Administered 2018-04-09: 1 via ORAL
  Filled 2018-04-08 (×2): qty 1

## 2018-04-08 MED ORDER — RIVAROXABAN 20 MG PO TABS: 20.0000 mg | ORAL_TABLET | Freq: Every day | ORAL | Status: DC

## 2018-04-08 MED ORDER — BENZTROPINE MESYLATE 0.5 MG PO TABS: 0.5000 mg | ORAL_TABLET | Freq: Two times a day (BID) | ORAL | Status: DC

## 2018-04-08 MED ORDER — RIVAROXABAN 15 MG PO TABS: 15.0000 mg | ORAL_TABLET | Freq: Two times a day (BID) | ORAL | Status: DC

## 2018-04-08 MED ORDER — TEMAZEPAM 15 MG PO CAPS
15.0000 mg | ORAL_CAPSULE | Freq: Every evening | ORAL | Status: DC | PRN
  Administered 2018-04-08: 15 mg via ORAL
  Filled 2018-04-08: qty 1

## 2018-04-08 MED ORDER — MAGNESIUM HYDROXIDE 400 MG/5ML PO SUSP: 30.0000 mL | Freq: Every day | ORAL | Status: DC | PRN

## 2018-04-08 MED ORDER — INSULIN ASPART 100 UNIT/ML ~~LOC~~ SOLN
0.0000 [IU] | Freq: Three times a day (TID) | SUBCUTANEOUS | Status: DC
  Administered 2018-04-08: 3 [IU] via SUBCUTANEOUS
  Administered 2018-04-09: 5 [IU] via SUBCUTANEOUS

## 2018-04-08 MED ORDER — ACETAMINOPHEN 325 MG PO TABS: 650.0000 mg | ORAL_TABLET | Freq: Four times a day (QID) | ORAL | Status: DC | PRN

## 2018-04-08 MED ORDER — BENZTROPINE MESYLATE 0.5 MG PO TABS
0.5000 mg | ORAL_TABLET | Freq: Two times a day (BID) | ORAL | Status: DC
  Administered 2018-04-08 – 2018-04-09 (×2): 0.5 mg via ORAL
  Filled 2018-04-08 (×8): qty 1

## 2018-04-08 MED ORDER — ONDANSETRON 4 MG PO TBDP: 4.0000 mg | ORAL_TABLET | Freq: Four times a day (QID) | ORAL | Status: DC | PRN

## 2018-04-08 MED ORDER — RISPERIDONE 3 MG PO TABS
3.0000 mg | ORAL_TABLET | Freq: Every day | ORAL | Status: DC
  Administered 2018-04-08: 3 mg via ORAL
  Filled 2018-04-08: qty 1
  Filled 2018-04-08: qty 3
  Filled 2018-04-08: qty 1

## 2018-04-08 NOTE — Progress Notes (Signed)
Debbie Dorsey is a 48 year old female pt returning voluntarily from medical floor. On re-admit, she reports that she is feeling better but still endorsing that she is feeing suicidal and also reports that she has been hearing voices. She was placed back on 1-1 on arrival and report was giving to [place] the receiving nurse. [name] was escorted to the unit, oriented to the milieu and safety maintained.

## 2018-04-08 NOTE — Progress Notes (Signed)
Patient's grandfather Mary Sella phone [phone] visited patient tonight.  Mr. Mary Sella stated he brought clothes by for patient one week ago.  Clothes:  3-4 shirts, underwear, and 2 pair of jeans.  There were also shoes that patient did get.  Patient had been at [facility] and then returned to [facility].  Patient stated she has not received her clothes.  One of the MHT's had looked for the clothes also.

## 2018-04-08 NOTE — Discharge Summary (Signed)
Physician Discharge Summary  BETSY ROSELLO MWN:[id number] DOB: 13-Dec-1970 DOA: 04/06/2018  PCP: Debbie Dorsey, No Pcp Per  Admit date: 04/06/2018 Discharge date: 04/08/2018  Admitted From: Home Disposition:  Home  Discharge Condition:Stable CODE STATUS:FULL Diet recommendation: Heart Healthy  Brief/Interim Summary: Debbie Dorsey is a 48 year old female with history of schizophrenia, type 2 diabetes mellitus who was sent to the emergency department from psychiatric unit.  She was initially admitted there on 04/02/2018 after she started having paranoia and auditory and visual hallucination.  Debbie Dorsey was found to be in persistent sinus tachycardia and was sent to the emergency department.  CT chest done in the emergency department showed a small bilateral pulmonary embolism.  Started on Xarelto. She is medically cleared to be transfered to behavioral health unit today.  Following problems were addressed during her hospitalization:  Acute pulmonary embolism: CT chest showed bilateral small pulmonary emboli.  Started on Xarelto.  Initially sent to emergency department from psychiatric unit for sinus tachycardia.  D-dimer was elevated.  No right ventricular strain.  Debbie Dorsey is not hypoxic and saturating fine on room air. She does not have lower extremity edema.  Pulmonary embolism was considered to be provoked during her recent hospital stay at psychiatric unit.  Continue anticoagulation for at least 3 to 6 months.  No history of hypercoagulability in the past.  Hypercoagulable profile has been sent and most of them are pending.  Sinus tachycardia: Much improved.  Anticipate spontaneous resolution.  Beta blocker discontinued.  Diabetes mellitus: Looks well controlled.Continue current insulin regimen.  She should continue on her previous regimen on discharge.  Schizophrenia: Currently being treated for acute psychosis at psychiatric unit.  She should be sent back to the psychiatric unit as soon as  possible. Started on Risperdal.  Also on Cogentin  Discharge Diagnoses:  Principal Problem:   Paranoid schizophrenia [facility])    Discharge Instructions  Discharge Instructions    Diet - low sodium heart healthy   Complete by:  As directed    Discharge instructions   Complete by:  As directed    Take prescribed medications as instructed.   Increase activity slowly   Complete by:  As directed      Allergies as of 04/08/2018   No Known Allergies     Medication List    STOP taking these medications   risperiDONE microspheres 25 MG injection Commonly known as:  RISPERDAL CONSTA     TAKE these medications   benztropine 0.5 MG tablet Commonly known as:  COGENTIN Take 1 tablet (0.5 mg total) by mouth 2 (two) times daily. What changed:    medication strength  how much to take  when to take this   HUMALOG MIX 75/25 KWIKPEN (75-25) 100 UNIT/ML Kwikpen Generic drug:  Insulin Lispro Prot & Lispro Inject 32-34 Units into the skin See admin instructions. Take 32 units in the evening and 34 units in the morning   MULTIVITAL Chew Chew 2 each by mouth daily.   naproxen 500 MG EC tablet Commonly known as:  EC NAPROSYN Take 1 tablet (500 mg total) by mouth 2 (two) times daily with a meal.   risperiDONE 2 MG tablet Commonly known as:  RISPERDAL Take 1 tablet (2 mg total) by mouth 2 (two) times daily. What changed:    medication strength  how much to take  when to take this   Rivaroxaban 15 MG Tabs tablet Commonly known as:  XARELTO Take 1 tablet (15 mg total) by mouth 2 (two) times daily  with a meal for 20 days.   rivaroxaban 20 MG Tabs tablet Commonly known as:  XARELTO Take 1 tablet (20 mg total) by mouth daily. Start taking on:  April 28, 2018   simvastatin 40 MG tablet Commonly known as:  ZOCOR Take 40 mg by mouth daily at 12 noon.       No Known Allergies  Consultations:  Psychiatry   Procedures/Studies: Ct Angio Chest Pe W And/or Wo  Contrast  Result Date: 04/06/2018 CLINICAL DATA:  Shortness of breath, chest pain for several months EXAM: CT ANGIOGRAPHY CHEST WITH CONTRAST TECHNIQUE: Multidetector CT imaging of the chest was performed using the standard protocol during bolus administration of intravenous contrast. Multiplanar CT image reconstructions and MIPs were obtained to evaluate the vascular anatomy. CONTRAST:  141mL ISOVUE-370 IOPAMIDOL (ISOVUE-370) INJECTION 76% COMPARISON:  None FINDINGS: Cardiovascular: Satisfactory opacification of the pulmonary arteries to the segmental level. Small nonocclusive pulmonary embolus in the right lower lobar pulmonary artery and right lower lobe segmental pulmonary arteries. Pulmonary embolus in the right upper lobe segmental pulmonary arteries. Small pulmonary embolus in the left upper lobe segmental branch. Normal heart size. No pericardial effusion. Mediastinum/Nodes: No enlarged mediastinal, hilar, or axillary lymph nodes. Thyroid gland, trachea, and esophagus demonstrate no significant findings. Lungs/Pleura: Lungs are clear. No pleural effusion or pneumothorax. Upper Abdomen: No acute abnormality. Musculoskeletal: No acute osseous abnormality. No aggressive osseous lesion. Review of the MIP images confirms the above findings. IMPRESSION: 1. Small bilateral pulmonary emboli involving the right lower lobar pulmonary artery, right lower lobe segmental pulmonary artery, right upper lobe segmental pulmonary artery, and left upper lobe segmental pulmonary artery. 2. No focal consolidation. Critical Value/emergent results were called by telephone at the time of interpretation on 04/06/2018 at 2:40 pm to [facility] , who verbally acknowledged these results. Electronically Signed   By: Kathreen Devoid   On: 04/06/2018 14:43       Subjective: Debbie Dorsey seen and examined the bedside this morning.  Hemodynamically stable.  Comfortable.  Denies any complaints.  No chest pain or shortness of  breath.  Discharge Exam: Vitals:   04/07/18 2139 04/08/18 0625  BP: 100/70 112/74  Pulse: (!) 121 93  Resp: 18 16  Temp: 100 F (37.8 C) 98.3 F (36.8 C)  SpO2: 98% 100%   Vitals:   04/07/18 1500 04/07/18 1836 04/07/18 2139 04/08/18 0625  BP:   100/70 112/74  Pulse: (!) 125 (!) 117 (!) 121 93  Resp:   18 16  Temp:   100 F (37.8 C) 98.3 F (36.8 C)  TempSrc:   Oral Oral  SpO2:  100% 98% 100%    General: Pt is alert, awake, not in acute distress Cardiovascular: RRR, S1/S2 +, no rubs, no gallops Respiratory: CTA bilaterally, no wheezing, no rhonchi Abdominal: Soft, NT, ND, bowel sounds + Extremities: no edema, no cyanosis    The results of significant diagnostics from this hospitalization (including imaging, microbiology, ancillary and laboratory) are listed below for reference.     Microbiology: No results found for this or any previous visit (from the past 240 hour(s)).   Labs: BNP (last 3 results) No results for input(s): BNP in the last 8760 hours. Basic Metabolic Panel: Recent Labs  Lab 04/02/18 0941 04/06/18 1225  NA 140 140  K 4.0 3.8  CL 108 104  CO2 20* 27  GLUCOSE 308* 76  BUN 14 14  CREATININE 1.09* 0.83  CALCIUM 9.6 9.6   Liver Function Tests: Recent Labs  Lab 04/02/18 0941  AST 30  ALT 26  ALKPHOS 55  BILITOT 0.7  PROT 7.9  ALBUMIN 4.1   No results for input(s): LIPASE, AMYLASE in the last 168 hours. No results for input(s): AMMONIA in the last 168 hours. CBC: Recent Labs  Lab 04/02/18 0941 04/06/18 1225  WBC 7.0 6.9  NEUTROABS  --  4.0  HGB 12.7 12.2  HCT 39.8 39.1  MCV 98.8 101.3*  PLT 423* 248   Cardiac Enzymes: No results for input(s): CKTOTAL, CKMB, CKMBINDEX, TROPONINI in the last 168 hours. BNP: Invalid input(s): POCBNP CBG: Recent Labs  Lab 04/07/18 1150 04/07/18 1617 04/07/18 2138 04/08/18 0735 04/08/18 1116  GLUCAP 280* 134* 123* 177* 318*   D-Dimer Recent Labs    04/06/18 1225  DDIMER 9.19*    Hgb A1c No results for input(s): HGBA1C in the last 72 hours. Lipid Profile No results for input(s): CHOL, HDL, LDLCALC, TRIG, CHOLHDL, LDLDIRECT in the last 72 hours. Thyroid function studies No results for input(s): TSH, T4TOTAL, T3FREE, THYROIDAB in the last 72 hours.  Invalid input(s): FREET3 Anemia work up No results for input(s): VITAMINB12, FOLATE, FERRITIN, TIBC, IRON, RETICCTPCT in the last 72 hours. Urinalysis No results found for: COLORURINE, APPEARANCEUR, LABSPEC, [place], GLUCOSEU, HGBUR, BILIRUBINUR, KETONESUR, PROTEINUR, UROBILINOGEN, NITRITE, LEUKOCYTESUR Sepsis Labs Invalid input(s): PROCALCITONIN,  WBC,  LACTICIDVEN Microbiology No results found for this or any previous visit (from the past 240 hour(s)).  Please note: You were cared for by a hospitalist during your hospital stay. Once you are discharged, your primary care physician will handle any further medical issues. Please note that NO REFILLS for any discharge medications will be authorized once you are discharged, as it is imperative that you return to your primary care physician (or establish a relationship with a primary care physician if you do not have one) for your post hospital discharge needs so that they can reassess your need for medications and monitor your lab values.    Time coordinating discharge: 40 minutes  SIGNED:   Shelly Coss, MD  [facility] 04/08/2018, 12:57 PM Pager [phone]  If 7PM-7AM, please contact night-coverage [email or web address] Password [email or web address]

## 2018-04-08 NOTE — Progress Notes (Signed)
1:1 Note Patient is in bed asleep. Respirations even and unlabored. No distress noted. Sitter within arms reach. Patient is safe and will continue to monitor with 1:1 and q15 minute checks. Patient was up and out of room for snack/meds earlier and was happy/smiling/childlike when interacting with this RN. Much redirection was needed by MHT for safety and common sense things. 1:1 continued for safety.

## 2018-04-08 NOTE — Progress Notes (Signed)
Called report to [facility]. Gave report to RN. Awaiting [place]

## 2018-04-08 NOTE — Progress Notes (Signed)
Patient ID: Debbie Dorsey, female   DOB: 24-Apr-1970, 48 y.o.   MRN: [id number]  1:1 Nursing Progress Note  D: Patient is observed resting in bed. Respirations are even & unlabored. Environment is secured. Sitter observed with patient per [employer].  A: Patient remains on 1:1 observation per provider orders. Low fall risk precautions in place. Patient safety monitored with q15 minute safety checks.  R: Patient remains safe on the unit at this time. Will continue to monitor with 1:1 sitter, q15 min safety checks & q4 hour nursing assessments.

## 2018-04-08 NOTE — Progress Notes (Signed)
Spoke with legal guardian Teryl Lucy [facility]) and informed him that pt is being discharged to [facility]. He was agreeable. Discharge instructions gone over with grandfather over the phone. Awaiting [place].

## 2018-04-08 NOTE — Tx Team (Signed)
Initial Treatment Plan 04/08/2018 4:06 PM [name] QSX:[id number]    PATIENT STRESSORS: Marital or family conflict   PATIENT STRENGTHS: Ability for insight General fund of knowledge Supportive family/friends   PATIENT IDENTIFIED PROBLEMS: Depression  Auditory hallucinations Suicidal thoughts "I'm still feeling suicidal"                     DISCHARGE CRITERIA:  Ability to meet basic life and health needs Improved stabilization in mood, thinking, and/or behavior Reduction of life-threatening or endangering symptoms to within safe limits Verbal commitment to aftercare and medication compliance  PRELIMINARY DISCHARGE PLAN: Attend aftercare/continuing care group Return to previous living arrangement  PATIENT/FAMILY INVOLVEMENT: This treatment plan has been presented to and reviewed with the patient, Debbie Dorsey, and/or family member, .  The patient and family have been given the opportunity to ask questions and make suggestions.  Torri Michalski, [place], [place] 04/08/2018, 4:06 PM

## 2018-04-08 NOTE — Progress Notes (Addendum)
12:48 PM  Patient has a bed at [facility].   Bed confirmed with Gerald Stabs. Patient will go to room 507 Bed 2.  Patient can arrive before 5 PM.    RN report # [id number] please call report prior to patient leaving the building.   Patient will transport by Betsy Pries.   10:11 AM  Per Leane Call [facility] will accept patient back.  No bed at time of update. Anticipate discharges and will notify LCSW when bed is available.    9:39 AM LCSW consulted for inpatient psych.  LCSW made referral with Gerald Stabs at [facility]. Patient under review.   LCSW awaiting response to confirm they can accept patient.   LCSW will continue to follow.   Carolin Coy Man [place]

## 2018-04-08 NOTE — Progress Notes (Addendum)
Patient ID: Debbie Dorsey, female   DOB: 1970/12/06, 48 y.o.   MRN: [id number]  1:1 Nursing Progress Note  D: Patient is ambulatory on her feet and in no acute distress. Patient denies concerns at this time.  A: Patient placed on 1:1 observation per [facility]. Low fall risk precautions in place. Patient safety monitored with q15 minute safety checks. Medications administered as ordered.  R: Patient remains safe on the unit at this time. Will continue to monitor with 1:1 sitter, q15 min safety checks & q4 hour nursing assessments.

## 2018-04-09 DIAGNOSIS — F200 Paranoid schizophrenia: Secondary | ICD-10-CM

## 2018-04-09 LAB — BETA-2-GLYCOPROTEIN I ABS, IGG/M/A
Beta-2 Glyco I IgG: <9 GPI IgG units (ref 0–20)
Beta-2-Glycoprotein I IgA: <9 GPI IgA units (ref 0–25)
Beta-2-Glycoprotein I IgM: <9 GPI IgM units (ref 0–32)

## 2018-04-09 LAB — GLUCOSE, CAPILLARY
Glucose-Capillary: 105 mg/dL — ABNORMAL HIGH (ref 70–99)
Glucose-Capillary: 219 mg/dL — ABNORMAL HIGH (ref 70–99)

## 2018-04-09 LAB — CARDIOLIPIN ANTIBODIES, IGG, IGM, IGA
Anticardiolipin IgG: <9 GPL U/mL (ref 0–14)
Anticardiolipin IgM: <9 MPL U/mL (ref 0–12)

## 2018-04-09 MED ORDER — RISPERIDONE 2 MG PO TABS
4.0000 mg | ORAL_TABLET | Freq: Every day | ORAL | Status: DC
  Filled 2018-04-09 (×2): qty 2

## 2018-04-09 MED ORDER — BENZTROPINE MESYLATE 0.5 MG PO TABS: 0.5000 mg | ORAL_TABLET | Freq: Two times a day (BID) | ORAL | 2 refills | Status: DC

## 2018-04-09 MED ORDER — RISPERIDONE 4 MG PO TABS: 4.0000 mg | ORAL_TABLET | Freq: Every day | ORAL | 1 refills | Status: DC

## 2018-04-09 MED ORDER — TEMAZEPAM 15 MG PO CAPS: 15.0000 mg | ORAL_CAPSULE | Freq: Every evening | ORAL | 0 refills | Status: DC | PRN

## 2018-04-09 MED ORDER — RIVAROXABAN 15 MG PO TABS: 15.0000 mg | ORAL_TABLET | Freq: Two times a day (BID) | ORAL | 1 refills | Status: DC

## 2018-04-09 NOTE — Progress Notes (Signed)
CSW spoke with pt legal guardian, Veronda Prude and informed him of pt discharge.  He can pick pt up around noon today.  CSW also spoke to him after pt had been transferred to medical unit and inquired again about a copy of the guardianship paperwork.  He stated at that time that he has not been able to locate it and he and his wife were continuing to look for it.  We discussed his going to the courthouse to get another copy if he is unable to find it. Winferd Humphrey, MSW, LCSW Clinical Social Worker 04/09/2018 9:30 AM

## 2018-04-09 NOTE — Progress Notes (Signed)
  [facility] Adult Case Management Discharge Plan :  Will you be returning to the same living situation after discharge:  Yes,  with grandfather At discharge, do you have transportation home?: Yes,  grandfather Do you have the ability to pay for your medications: Yes,  medicare/medicaid  Release of information consent forms completed and in the chart;  Patient's signature needed at discharge.  Patient to Follow up at: Follow-up Information    [facility] Follow up on 04/12/2018.   Why:  Hospital follow up appointment is Friday, 3/6 at 8:00a. Please bring your photo ID, proof of insurance, SSN, current medications and discharge paperwork from this hospitalization.  Contact information: [address] [place] [address] [phone]           Next level of care provider has access to [place] and Suicide Prevention discussed: Yes,  with grandfather/legal guardian  Have you used any form of tobacco in the last 30 days? (Cigarettes, Smokeless Tobacco, Cigars, and/or Pipes): No  Has patient been referred to the Quitline?: N/A patient is not a smoker  Patient has been referred for addiction treatment: N/A  Joanne Chars, LCSW 04/09/2018, 10:50 AM

## 2018-04-09 NOTE — Progress Notes (Signed)
Patient ID: Debbie Dorsey, female   DOB: 1970-02-22, 48 y.o.   MRN: [id number]   Patient discharged to home/self care in the presence of her legal guardian.  Patient denies SI, HI and AVH upon discharge.  Patient acknowledges all understanding of discharge instructions and receipt of personal belongings.

## 2018-04-09 NOTE — [facility]HH Suicide Risk Assessment (Signed)
[facility] Discharge Suicide Risk Assessment   Principal Problem: Exacerbation and underlying psychotic disorder Discharge Diagnoses: Active Problems:   Schizophrenia ([place])   Total Time spent with patient: 45 minutes  Currently is alert oriented to person place time and situation affect congruent with no thoughts of harming self or others contracting fully no psychosis/no EPS or TD  Mental Status Per Nursing Assessment::   On Admission:  Self-harm thoughts  Demographic Factors:  Low socioeconomic status  Loss Factors: Decrease in vocational status  Historical Factors: NA  Risk Reduction Factors:   Religious beliefs about death and Positive social support  Continued Clinical Symptoms:  Schizophrenia:   Paranoid or undifferentiated type  Cognitive Features That Contribute To Risk:  Loss of executive function    Suicide Risk:  Minimal: No identifiable suicidal ideation.  Patients presenting with no risk factors but with morbid ruminations; may be classified as minimal risk based on the severity of the depressive symptoms    Plan Of Care/Follow-up recommendations:  Activity:  full  Jenavee Laguardia, MD 04/09/2018, 8:57 AM
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
## 2018-04-09 NOTE — Progress Notes (Signed)
1:1 Note Patient is in bed asleep. Respirations even and unlabored. No distress noted. Sitter within arms reach. Patient is safe and will continue to monitor with 1:1 and q15 minute checks.

## 2018-04-09 NOTE — BHH Counselor (Signed)
Pt readmitted to [facility] from medical floor.  On unit less than 24 hours.  Please see PSA dated 04/04/18. Winferd Humphrey, MSW, LCSW Clinical Social Worker 04/09/2018 9:09 AM

## 2018-04-09 NOTE — [facility] Suicide Risk Assessment (Signed)
[place] INPATIENT:  Family/Significant Other Suicide Prevention Education  Suicide Prevention Education: Please see previous SPE completed with pt legal guardian on 04/04/18. Winferd Humphrey, MSW, LCSW Clinical Social Worker 04/09/2018 9:11 AM

## 2018-04-09 NOTE — Plan of Care (Signed)
D: Patient is a nurses station on approach. Patient is pleasant and cooperative. Patient demeanor brightens and she smiles when this RN smiles and says hi to her. Patient forwards little information but does say her day was good and her grandfather "turned her world around". Denies SI, HI, AVH, and verbally contracts for safety. Patient denies physical symptoms/pain. Patient sat in day room for part of snack time, did not interact with any other patients.    A: Scheduled medications administered per MD order. Support provided. Patient educated on safety on the unit and medications. Routine safety checks every 15 minutes. Patient stated understanding to tell nurse about any new physical symptoms. Patient understands to tell staff of any needs.     R: No adverse drug reactions noted. Patient verbally contracts for safety. Patient remains safe at this time and will continue to monitor.   Problem: Education: Goal: Knowledge of [place] [facility] Education information/materials will improve Outcome: Progressing   Problem: Safety: Goal: Periods of time without injury will increase Outcome: Progressing   Patient oriented to the unit. Patient remains safe and will continue to monitor.

## 2018-04-09 NOTE — Progress Notes (Signed)
Patient being monitored 1:1 due to safety.  1:1 staff in close proximity to patient.  Patient has been able to maintain safety with the support of 1:1 sitter.

## 2018-04-09 NOTE — H&P (Signed)
Psychiatric Admission Assessment Adult  Patient Identification: Debbie Dorsey MRN:  [id number] Date of Evaluation:  04/09/2018 Chief Complaint:  SCHIZOPHRENIA PSYCHOSIS AND DELUSIONS Principal Diagnosis: Exacerbation and underlying schizophrenic disorder Diagnosis:  Active Problems:   Schizophrenia ([name])  History of Present Illness:   Patient is transferred back to psychiatry  Is readmitted for further stabilization again she had a brief period of medical evaluation due to clots she is now on her anticoagulation therapy.  She is alert and oriented and cooperative reporting resolution in hallucinations see progress note from today  Associated Signs/Symptoms: Depression Symptoms:  insomnia, (Hypo) Manic Symptoms:  Hallucinations, Anxiety Symptoms:  Specific Phobias, none Psychotic Symptoms:  Hallucinations: Auditory PTSD Symptoms: NA Total Time spent with patient: 45 minutes   Is the patient at risk to self? No.  Has the patient been a risk to self in the past 6 months? No.  Has the patient been a risk to self within the distant past? No.  Is the patient a risk to others? No.  Has the patient been a risk to others in the past 6 months? No.  Has the patient been a risk to others within the distant past? No.   Prior Inpatient Therapy:   Prior Outpatient Therapy:    Alcohol Screening: 1. How often do you have a drink containing alcohol?: Never 2. How many drinks containing alcohol do you have on a typical day when you are drinking?: 1 or 2 3. How often do you have six or more drinks on one occasion?: Never AUDIT-C Score: 0 4. How often during the last year have you found that you were not able to stop drinking once you had started?: Never 5. How often during the last year have you failed to do what was normally expected from you becasue of drinking?: Never 6. How often during the last year have you needed a first drink in the morning to get yourself going after a heavy drinking  session?: Never 7. How often during the last year have you had a feeling of guilt of remorse after drinking?: Never 8. How often during the last year have you been unable to remember what happened the night before because you had been drinking?: Never 9. Have you or someone else been injured as a result of your drinking?: No 10. Has a relative or friend or a doctor or another health worker been concerned about your drinking or suggested you cut down?: No Alcohol Use Disorder Identification Test Final Score (AUDIT): 0 Alcohol Brief Interventions/Follow-up: AUDIT Score <7 follow-up not indicated Substance Abuse History in the last 12 months:  Yes.   Consequences of Substance Abuse: NA Previous Psychotropic Medications: Yes  Psychological Evaluations: No  Past Medical History:  Past Medical History:  Diagnosis Date  . Diabetes mellitus without complication ([place])    History reviewed. No pertinent surgical history. Family History:  Family History  Problem Relation Age of Onset  . Mental illness Sister    Family Psychiatric  History: neg Tobacco Screening: Have you used any form of tobacco in the last 30 days? (Cigarettes, Smokeless Tobacco, Cigars, and/or Pipes): No Social History:  Social History   Substance and Sexual Activity  Alcohol Use No     Social History   Substance and Sexual Activity  Drug Use No    Additional Social History:  Allergies:  No Known Allergies Lab Results:  Results for orders placed or performed during the hospital encounter of 04/08/18 (from the past 48 hour(s))  Glucose, capillary     Status: Abnormal   Collection Time: 04/08/18  5:02 PM  Result Value Ref Range   Glucose-Capillary 183 (H) 70 - 99 mg/dL  Glucose, capillary     Status: Abnormal   Collection Time: 04/08/18  8:09 PM  Result Value Ref Range   Glucose-Capillary 106 (H) 70 - 99 mg/dL  Glucose, capillary     Status: Abnormal   Collection Time:  04/09/18  6:21 AM  Result Value Ref Range   Glucose-Capillary 105 (H) 70 - 99 mg/dL  Glucose, capillary     Status: Abnormal   Collection Time: 04/09/18 11:44 AM  Result Value Ref Range   Glucose-Capillary 219 (H) 70 - 99 mg/dL    Blood Alcohol level:  Lab Results  Component Value Date   ETH <10 67/20/9470    Metabolic Disorder Labs:  No results found for: HGBA1C, MPG No results found for: PROLACTIN No results found for: CHOL, TRIG, HDL, CHOLHDL, VLDL, LDLCALC  Current Medications: Current Facility-Administered Medications  Medication Dose Route Frequency Provider Last Rate Last Dose  . acetaminophen (TYLENOL) tablet 650 mg  650 mg Oral Q6H PRN Money, Lowry Ram, FNP      . alum & mag hydroxide-simeth (MAALOX/MYLANTA) 200-200-20 MG/5ML suspension 30 mL  30 mL Oral Q4H PRN Money, Darnelle Maffucci B, FNP      . benztropine (COGENTIN) tablet 0.5 mg  0.5 mg Oral BID Money, Darnelle Maffucci B, FNP   0.5 mg at 04/09/18 0754  . feeding supplement (GLUCERNA SHAKE) (GLUCERNA SHAKE) liquid 237 mL  237 mL Oral TID BM Money, Lowry Ram, FNP   237 mL at 04/08/18 2114  . insulin aspart (novoLOG) injection 0-15 Units  0-15 Units Subcutaneous TID WC Money, Lowry Ram, FNP   3 Units at 04/08/18 1712  . insulin aspart (novoLOG) injection 0-5 Units  0-5 Units Subcutaneous QHS Money, Lowry Ram, FNP      . insulin aspart protamine- aspart (NOVOLOG MIX 70/30) injection 32 Units  32 Units Subcutaneous BID WC Money, Lowry Ram, FNP   32 Units at 04/09/18 0751  . magnesium hydroxide (MILK OF MAGNESIA) suspension 30 mL  30 mL Oral Daily PRN Money, Darnelle Maffucci B, FNP      . ondansetron (ZOFRAN-ODT) disintegrating tablet 4 mg  4 mg Oral Q6H PRN Money, Lowry Ram, FNP      . prenatal multivitamin tablet 1 tablet  1 tablet Oral Q1200 Money, Lowry Ram, FNP      . risperiDONE (RISPERDAL) tablet 4 mg  4 mg Oral QHS Johnn Hai, MD      . Rivaroxaban Alveda Reasons) tablet 15 mg  15 mg Oral BID WC Money, Lowry Ram, FNP   15 mg at 04/09/18 0755  . [START ON  04/28/2018] rivaroxaban (XARELTO) tablet 20 mg  20 mg Oral Daily Money, Travis B, FNP      . temazepam (RESTORIL) capsule 15 mg  15 mg Oral QHS PRN Money, Lowry Ram, FNP   15 mg at 04/08/18 2112   PTA Medications: Medications Prior to Admission  Medication Sig Dispense Refill Last Dose  . HUMALOG MIX 75/25 KWIKPEN (75-25) 100 UNIT/ML Kwikpen Inject 32-34 Units into the skin See admin instructions. Take 32 units in the evening and 34 units in the morning   04/05/2018 at Unknown time  . Multiple Vitamins-Minerals ([place]) CHEW Chew  2 each by mouth daily.   04/05/2018 at Unknown time  . naproxen (EC NAPROSYN) 500 MG EC tablet Take 1 tablet (500 mg total) by mouth 2 (two) times daily with a meal. 30 tablet 0 04/05/2018 at Unknown time  . risperiDONE (RISPERDAL) 2 MG tablet Take 1 tablet (2 mg total) by mouth 2 (two) times daily.     . Rivaroxaban (XARELTO) 15 MG TABS tablet Take 1 tablet (15 mg total) by mouth 2 (two) times daily with a meal for 20 days. 40 tablet    . [START ON 04/28/2018] rivaroxaban (XARELTO) 20 MG TABS tablet Take 1 tablet (20 mg total) by mouth daily. 30 tablet    . simvastatin (ZOCOR) 40 MG tablet Take 40 mg by mouth daily at 12 noon.    04/05/2018 at Unknown time  . [DISCONTINUED] benztropine (COGENTIN) 0.5 MG tablet Take 1 tablet (0.5 mg total) by mouth 2 (two) times daily.      Currently is alert and oriented and coherent and thoughts denies wanting to harm self or others did report that to staff but denies with me no current auditory visual loose [place] no EPS or TD  Treatment Plan Summary: Daily contact with patient to assess and evaluate symptoms and progress in treatment and Medication management  Observation Level/Precautions:  15 minute checks  Laboratory:  UDS  Psychotherapy:  No changes  Medications:  Non changes  Consultations:  IM  Discharge Concerns:  Long term stability  Estimated LOS:2  Other:  -   Physician Treatment Plan for Primary Diagnosis:  <principal problem not specified> Long Term Goal(s): Improvement in symptoms so as ready for discharge  Short Term Goals: Compliance with prescribed medications will improve and Ability to identify triggers associated with substance abuse/mental health issues will improve  Physician Treatment Plan for Secondary Diagnosis: Active Problems:   Schizophrenia ([place])  Long Term Goal(s): Improvement in symptoms so as ready for discharge  Short Term Goals: Compliance with prescribed medications will improve and Ability to identify triggers associated with substance abuse/mental health issues will improve  I certify that inpatient services furnished can reasonably be expected to improve the patient's condition.    Johnn Hai, MD 3/3/202012:04 PM

## 2018-04-10 LAB — PROTEIN C, TOTAL: PROTEIN C, TOTAL: 123 % (ref 60–150)

## 2018-04-11 LAB — FACTOR 5 LEIDEN

## 2018-04-12 LAB — PROTHROMBIN GENE MUTATION

## 2018-04-19 ENCOUNTER — Other Ambulatory Visit: Payer: Self-pay

## 2018-04-19 ENCOUNTER — Emergency Department (HOSPITAL_COMMUNITY)
Admission: EM | Admit: 2018-04-19 | Discharge: 2018-04-20 | Disposition: A | Discharge status: Other Acute Inpatient Hospital | Payer: Medicare Other | Attending: Emergency Medicine | Admitting: Emergency Medicine

## 2018-04-19 ENCOUNTER — Encounter (HOSPITAL_COMMUNITY): Payer: Self-pay | Admitting: Emergency Medicine

## 2018-04-19 DIAGNOSIS — G4700 Insomnia, unspecified: Secondary | ICD-10-CM | POA: Diagnosis not present

## 2018-04-19 DIAGNOSIS — R443 Hallucinations, unspecified: Secondary | ICD-10-CM | POA: Diagnosis present

## 2018-04-19 DIAGNOSIS — Z79899 Other long term (current) drug therapy: Secondary | ICD-10-CM | POA: Insufficient documentation

## 2018-04-19 DIAGNOSIS — F209 Schizophrenia, unspecified: Secondary | ICD-10-CM | POA: Insufficient documentation

## 2018-04-19 DIAGNOSIS — E119 Type 2 diabetes mellitus without complications: Secondary | ICD-10-CM | POA: Diagnosis not present

## 2018-04-19 DIAGNOSIS — F259 Schizoaffective disorder, unspecified: Secondary | ICD-10-CM

## 2018-04-19 LAB — CBG MONITORING, ED
Glucose-Capillary: 265 mg/dL — ABNORMAL HIGH (ref 70–99)
Glucose-Capillary: 87 mg/dL (ref 70–99)
Glucose-Capillary: 89 mg/dL (ref 70–99)

## 2018-04-19 LAB — RAPID URINE DRUG SCREEN, HOSP PERFORMED
Amphetamines: NOT DETECTED
Barbiturates: NOT DETECTED
Benzodiazepines: POSITIVE — AB
Cocaine: NOT DETECTED
Opiates: NOT DETECTED
Tetrahydrocannabinol: NOT DETECTED

## 2018-04-19 LAB — CBC WITH DIFFERENTIAL/PLATELET
Abs Immature Granulocytes: 0.02 10*3/uL (ref 0.00–0.07)
Basophils Absolute: 0 10*3/uL (ref 0.0–0.1)
Basophils Relative: 1 %
EOS PCT: 0 %
Eosinophils Absolute: 0 10*3/uL (ref 0.0–0.5)
HCT: 38.6 % (ref 36.0–46.0)
Hemoglobin: 12.1 g/dL (ref 12.0–15.0)
Immature Granulocytes: 0 %
Lymphocytes Relative: 24 %
Lymphs Abs: 1.4 10*3/uL (ref 0.7–4.0)
MCH: 30.8 pg (ref 26.0–34.0)
MCHC: 31.3 g/dL (ref 30.0–36.0)
MCV: 98.2 fL (ref 80.0–100.0)
Monocytes Absolute: 0.5 10*3/uL (ref 0.1–1.0)
Monocytes Relative: 8 %
NRBC: 0 % (ref 0.0–0.2)
Neutro Abs: 4 10*3/uL (ref 1.7–7.7)
Neutrophils Relative %: 67 %
Platelets: 516 10*3/uL — ABNORMAL HIGH (ref 150–400)
RBC: 3.93 MIL/uL (ref 3.87–5.11)
RDW: 13 % (ref 11.5–15.5)
WBC: 5.9 10*3/uL (ref 4.0–10.5)

## 2018-04-19 LAB — HEMOGLOBIN A1C
Hgb A1c MFr Bld: 8.9 % — ABNORMAL HIGH (ref 4.8–5.6)
Mean Plasma Glucose: 208.73 mg/dL

## 2018-04-19 LAB — COMPREHENSIVE METABOLIC PANEL
ALT: 16 U/L (ref 0–44)
AST: 22 U/L (ref 15–41)
Albumin: 3.9 g/dL (ref 3.5–5.0)
Alkaline Phosphatase: 55 U/L (ref 38–126)
Anion gap: 9 (ref 5–15)
BUN: 13 mg/dL (ref 6–20)
CO2: 22 mmol/L (ref 22–32)
CREATININE: 0.78 mg/dL (ref 0.44–1.00)
Calcium: 9.6 mg/dL (ref 8.9–10.3)
Chloride: 106 mmol/L (ref 98–111)
GFR calc Af Amer: >60 mL/min (ref >60)
GFR calc non Af Amer: >60 mL/min (ref >60)
Glucose, Bld: 232 mg/dL — ABNORMAL HIGH (ref 70–99)
Potassium: 4.2 mmol/L (ref 3.5–5.1)
Sodium: 137 mmol/L (ref 135–145)
Total Bilirubin: 0.8 mg/dL (ref 0.3–1.2)
Total Protein: 7.4 g/dL (ref 6.5–8.1)

## 2018-04-19 LAB — ETHANOL: Alcohol, Ethyl (B): <10 mg/dL (ref <10)

## 2018-04-19 LAB — PREGNANCY, URINE: Preg Test, Ur: NEGATIVE

## 2018-04-19 MED ORDER — RISPERIDONE 3 MG PO TABS
4.0000 mg | ORAL_TABLET | Freq: Every day | ORAL | Status: DC
  Administered 2018-04-19: 4 mg via ORAL
  Filled 2018-04-19: qty 2

## 2018-04-19 MED ORDER — INSULIN ASPART 100 UNIT/ML ~~LOC~~ SOLN
0.0000 [IU] | Freq: Three times a day (TID) | SUBCUTANEOUS | Status: DC
  Administered 2018-04-19: 8 [IU] via SUBCUTANEOUS
  Administered 2018-04-20: 15 [IU] via SUBCUTANEOUS
  Administered 2018-04-20: 8 [IU] via SUBCUTANEOUS

## 2018-04-19 MED ORDER — RIVAROXABAN 15 MG PO TABS
15.0000 mg | ORAL_TABLET | Freq: Two times a day (BID) | ORAL | Status: DC
  Administered 2018-04-19 – 2018-04-20 (×3): 15 mg via ORAL
  Filled 2018-04-19 (×4): qty 1

## 2018-04-19 MED ORDER — ACETAMINOPHEN 325 MG PO TABS
650.0000 mg | ORAL_TABLET | ORAL | Status: DC | PRN
  Administered 2018-04-20: 650 mg via ORAL
  Filled 2018-04-19: qty 2

## 2018-04-19 MED ORDER — BENZTROPINE MESYLATE 1 MG PO TABS
0.5000 mg | ORAL_TABLET | Freq: Two times a day (BID) | ORAL | Status: DC
  Administered 2018-04-19 – 2018-04-20 (×3): 0.5 mg via ORAL
  Filled 2018-04-19 (×3): qty 1

## 2018-04-19 MED ORDER — ZOLPIDEM TARTRATE 5 MG PO TABS
5.0000 mg | ORAL_TABLET | Freq: Every evening | ORAL | Status: DC | PRN
  Administered 2018-04-19: 5 mg via ORAL
  Filled 2018-04-19: qty 1

## 2018-04-19 MED ORDER — INSULIN ASPART 100 UNIT/ML ~~LOC~~ SOLN: 0.0000 [IU] | Freq: Every day | SUBCUTANEOUS | Status: DC

## 2018-04-19 MED ORDER — ONDANSETRON HCL 4 MG PO TABS
4.0000 mg | ORAL_TABLET | Freq: Three times a day (TID) | ORAL | Status: DC | PRN
  Administered 2018-04-20: 4 mg via ORAL
  Filled 2018-04-19: qty 1

## 2018-04-19 MED ORDER — BUSPIRONE HCL 10 MG PO TABS
5.0000 mg | ORAL_TABLET | Freq: Three times a day (TID) | ORAL | Status: DC
  Administered 2018-04-19 – 2018-04-20 (×4): 5 mg via ORAL
  Filled 2018-04-19 (×4): qty 1

## 2018-04-19 MED ORDER — ALUM & MAG HYDROXIDE-SIMETH 200-200-20 MG/5ML PO SUSP: 30.0000 mL | Freq: Four times a day (QID) | ORAL | Status: DC | PRN

## 2018-04-19 MED ORDER — INSULIN ASPART 100 UNIT/ML ~~LOC~~ SOLN
4.0000 [IU] | Freq: Three times a day (TID) | SUBCUTANEOUS | Status: DC
  Administered 2018-04-19 – 2018-04-20 (×2): 4 [IU] via SUBCUTANEOUS

## 2018-04-19 MED ORDER — LORAZEPAM 1 MG PO TABS
1.0000 mg | ORAL_TABLET | Freq: Once | ORAL | Status: AC
  Administered 2018-04-19: 1 mg via ORAL
  Filled 2018-04-19: qty 1

## 2018-04-19 NOTE — ED Provider Notes (Signed)
[facility] EMERGENCY DEPARTMENT Provider Note   CSN: [id number] Arrival date & time: 04/19/18  1024    History   Chief Complaint Chief Complaint  Patient presents with  . Manic Behavior    HPI Debbie Dorsey is a 48 y.o. female.     The history is provided by the patient, a relative and medical records. No language interpreter was used.     48 year old female with history of paranoid schizophrenia, diabetes presenting to the ED with concern of hallucination.  History obtained through father who is at bedside and through patient.  Per grandfather, for the past 3 days patient has not slept.  She is not behaving normally, and also having paranoia.  Furthermore, patient was recently taken off her sleeping medication due to hallucination and psychosis.  Her symptoms still persist.  Patient does admits to having auditory and visual respiratory sedation.  She denies any SI or HI.  She denies any dietary changes.  She denies alcohol or tobacco use.  She admits to feeling depressed.  It is difficult to obtain history from patient due to underlying psychiatric illness.  Level 5 caveats  Past Medical History:  Diagnosis Date  . Diabetes mellitus without complication [facility])     Patient Active Problem List   Diagnosis Date Noted  . Schizophrenia ([place]) 04/08/2018  . Paranoid schizophrenia ([place]) 04/02/2018    History reviewed. No pertinent surgical history.   OB History    Gravida  1   Para      Term      Preterm      AB      Living        SAB      TAB      Ectopic      Multiple      Live Births               Home Medications    Prior to Admission medications   Medication Sig Start Date End Date Taking? Authorizing Provider  benztropine (COGENTIN) 0.5 MG tablet Take 1 tablet (0.5 mg total) by mouth 2 (two) times daily. 04/09/18   Johnn Hai, MD  HUMALOG MIX 75/25 KWIKPEN (75-25) 100 UNIT/ML Kwikpen Inject 32-34 Units into the skin See  admin instructions. Take 32 units in the evening and 34 units in the morning 01/21/18   [provider]  Multiple Vitamins-Minerals (MULTIVITAL) CHEW Chew 2 each by mouth daily.    [provider]  naproxen (EC NAPROSYN) 500 MG EC tablet Take 1 tablet (500 mg total) by mouth 2 (two) times daily with a meal. 11/14/16   Wendling, Crosby Oyster, DO  risperiDONE (RISPERDAL) 4 MG tablet Take 1 tablet (4 mg total) by mouth at bedtime. 04/09/18   Johnn Hai, MD  Rivaroxaban (XARELTO) 15 MG TABS tablet Take 1 tablet (15 mg total) by mouth 2 (two) times daily with a meal. 04/09/18   Johnn Hai, MD  rivaroxaban (XARELTO) 20 MG TABS tablet Take 1 tablet (20 mg total) by mouth daily. 04/28/18   Shelly Coss, MD  simvastatin (ZOCOR) 40 MG tablet Take 40 mg by mouth daily at 12 noon.     [provider]  temazepam (RESTORIL) 15 MG capsule Take 1 capsule (15 mg total) by mouth at bedtime as needed for sleep. 04/09/18   Johnn Hai, MD    Family History Family History  Problem Relation Age of Onset  . Mental illness Sister  Social History Social History   Tobacco Use  . Smoking status: Never Smoker  . Smokeless tobacco: Never Used  Substance Use Topics  . Alcohol use: No  . Drug use: No     Allergies   Patient has no known allergies.   Review of Systems Review of Systems  Unable to perform ROS: Psychiatric disorder     Physical Exam Updated Vital Signs BP (!) 125/95 (BP Location: Right Arm)   Pulse (!) 128   Temp 98.4 F (36.9 C) (Oral)   Resp 18   LMP  (LMP Unknown)   SpO2 100%   Physical Exam Vitals signs and nursing note reviewed.  Constitutional:      General: She is not in acute distress.    Appearance: She is well-developed.  HENT:     Head: Atraumatic.  Eyes:     Conjunctiva/sclera: Conjunctivae normal.  Neck:     Musculoskeletal: Neck supple.  Cardiovascular:     Rate and Rhythm: Tachycardia present.  Pulmonary:     Effort: Pulmonary  effort is normal.     Breath sounds: Normal breath sounds.  Abdominal:     Palpations: Abdomen is soft.     Tenderness: There is no abdominal tenderness.  Skin:    Findings: No rash.  Neurological:     Mental Status: She is alert.     GCS: GCS eye subscore is 4. GCS verbal subscore is 5. GCS motor subscore is 6.     Comments: Alert and oriented to self only.  Psychiatric:        Mood and Affect: Affect is blunt.        Speech: Speech is delayed.        Behavior: Behavior is slowed.        Thought Content: Thought content is paranoid and delusional. Thought content does not include homicidal or suicidal ideation.      ED Treatments / Results  Labs (all labs ordered are listed, but only abnormal results are displayed) Labs Reviewed  COMPREHENSIVE METABOLIC PANEL - Abnormal; Notable for the following components:      Result Value   Glucose, Bld 232 (*)    All other components within normal limits  CBC WITH DIFFERENTIAL/PLATELET - Abnormal; Notable for the following components:   Platelets 516 (*)    All other components within normal limits  RAPID URINE DRUG SCREEN, HOSP PERFORMED - Abnormal; Notable for the following components:   Benzodiazepines POSITIVE (*)    All other components within normal limits  HEMOGLOBIN A1C - Abnormal; Notable for the following components:   Hgb A1c MFr Bld 8.9 (*)    All other components within normal limits  PREGNANCY, URINE  ETHANOL    EKG EKG Interpretation  Date/Time:  Friday April 19 2018 11:36:19 EDT Ventricular Rate:  135 PR Interval:    QRS Duration: 70 QT Interval:  275 QTC Calculation: 413 R Axis:   177 Text Interpretation:  Sinus rhythm precordial twi, new from prior Confirmed by Virgel Manifold [phone]) on 04/19/2018 11:50:05 AM   Radiology No results found.  Procedures Procedures (including critical care time)  Medications Ordered in ED Medications  acetaminophen (TYLENOL) tablet 650 mg (has no administration in time  range)  zolpidem (AMBIEN) tablet 5 mg (has no administration in time range)  ondansetron (ZOFRAN) tablet 4 mg (has no administration in time range)  alum & mag hydroxide-simeth (MAALOX/MYLANTA) 200-200-20 MG/5ML suspension 30 mL (has no administration in time range)  benztropine (COGENTIN)  tablet 0.5 mg (has no administration in time range)  busPIRone (BUSPAR) tablet 5 mg (has no administration in time range)  risperiDONE (RISPERDAL) tablet 4 mg (has no administration in time range)  Rivaroxaban (XARELTO) tablet 15 mg (has no administration in time range)  insulin aspart (novoLOG) injection 0-15 Units (has no administration in time range)  insulin aspart (novoLOG) injection 0-5 Units (has no administration in time range)  insulin aspart (novoLOG) injection 4 Units (has no administration in time range)  LORazepam (ATIVAN) tablet 1 mg (1 mg Oral Given 04/19/18 1137)     Initial Impression / Assessment and Plan / ED Course  I have reviewed the triage vital signs and the nursing notes.  Pertinent labs & imaging results that were available during my care of the patient were reviewed by me and considered in my medical decision making (see chart for details).        BP (!) 143/102   Pulse (!) 124   Temp 98.4 F (36.9 C) (Oral)   Resp (!) 25   LMP  (LMP Unknown)   SpO2 98%  Tachycardia likely 2/2 ongoing PE. Currently on Xarelto.  Final Clinical Impressions(s) / ED Diagnoses   Final diagnoses:  Schizoaffective disorder, unspecified type ([place])  Hallucinations  Insomnia, unspecified type    ED Discharge Orders    None     11:25 AM Patient with history of paranoid schizophrenia here with symptoms concerning for psychosis.  She is seeing things and hearing things but denies any command hallucination.  She is found to be tachycardic with heart rate of 126, will obtain EKG.  She does appear dehydrated, IV fluid given.  Work-up initiated.  1:00 PM Patient remains tachycardic but does  not complain of any shortness of breath.  From prior notes, patient was recently diagnosed with bilateral subsegmental PE and is currently taking Xarelto.  I did discuss this with her grandfather who is at bedside who reportedly states patient is compliant with her treatment as he is responsible for giving patient daily medication.  Therefore, I do not think a repeat CT scan is beneficial at this time.  She is not hypoxic.  3:05 PM Pt is medically cleared and will benefit further psychiatric assessment for her insomnia and hallucination.    Domenic Moras, PA-C 04/19/18 1506    Virgel Manifold, MD 04/26/18 [phone]

## 2018-04-19 NOTE — ED Notes (Signed)
Dinner tray ordered.
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
## 2018-04-19 NOTE — ED Notes (Signed)
Per lab- able to add on A1c 

## 2018-04-20 ENCOUNTER — Other Ambulatory Visit: Payer: Self-pay

## 2018-04-20 DIAGNOSIS — F209 Schizophrenia, unspecified: Secondary | ICD-10-CM | POA: Diagnosis not present

## 2018-04-20 LAB — CBG MONITORING, ED
Glucose-Capillary: 479 mg/dL — ABNORMAL HIGH (ref 70–99)
Glucose-Capillary: 428 mg/dL — ABNORMAL HIGH (ref 70–99)
Glucose-Capillary: 297 mg/dL — ABNORMAL HIGH (ref 70–99)

## 2018-04-20 MED ORDER — LORAZEPAM 1 MG PO TABS
1.0000 mg | ORAL_TABLET | Freq: Once | ORAL | Status: AC
  Administered 2018-04-20: 1 mg via ORAL
  Filled 2018-04-20: qty 1

## 2018-04-20 NOTE — ED Notes (Addendum)
Pt walking on unit with Darliss Ridgel.

## 2018-04-20 NOTE — BH Assessment (Signed)
Spoke with Teryl Lucy, pt's GF and guardian and explained the admission process. Guardian expressed concern about the distance of pt going to [facility], but then verbalized understanding and agrees with the disposition of placing pt so that she can get out of the ED as son as possible.

## 2018-04-20 NOTE — ED Notes (Addendum)
Grandfather speaking with [place].  Agrees pt needs to be IVC'D to go to [facility].  MD notified of [facility] request for IVC.

## 2018-04-20 NOTE — ED Notes (Signed)
Pt being transported to [facility] with no belongings.
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
## 2018-04-20 NOTE — ED Notes (Signed)
[facility] scrubs and now has a Actuary.

## 2018-04-20 NOTE — ED Notes (Signed)
Lunch tray orderd

## 2018-04-20 NOTE — Progress Notes (Signed)
Patient meets criteria for inpatient treatment per [facility] NP. No appropriate beds available at [facility]. CSW faxed referrals to the following facilities for review:  [facility], [facility], [place], [place], [place], [place], [place], [place], [place], [place], [place], [place], [place], [place], [place], [place].   TTS will continue to seek bed placement.   Maxie Better, MSW, LCSW Clinical Social Worker 04/20/2018 11:51 AM

## 2018-04-20 NOTE — Progress Notes (Signed)
Pt has been accepted to [facility]) per [place] in admissions for anytime today. Number for report: [phone]. Claiborne Billings requested IVC paperwork to be faxed to: [phone]. RN notified of above.   Nikan Ellingson S. Ouida Sills, MSW, LCSW Clinical Social Worker 04/20/2018 2:00 PM

## 2018-04-20 NOTE — ED Notes (Signed)
Breakfast Tray ordered  

## 2018-04-20 NOTE — ED Notes (Signed)
[place] called to serve [employer].
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
## 2018-05-22 ENCOUNTER — Ambulatory Visit (HOSPITAL_COMMUNITY)
Admission: RE | Admit: 2018-05-22 | Discharge: 2018-05-22 | Disposition: A | Discharge status: Home / Self Care | Payer: Medicare Other | Source: Home / Self Care | Attending: Psychiatry | Admitting: Psychiatry

## 2018-05-22 DIAGNOSIS — G210 Malignant neuroleptic syndrome: Secondary | ICD-10-CM | POA: Diagnosis not present

## 2018-05-22 DIAGNOSIS — R4789 Other speech disturbances: Secondary | ICD-10-CM

## 2018-05-22 DIAGNOSIS — M7989 Other specified soft tissue disorders: Secondary | ICD-10-CM

## 2018-05-22 DIAGNOSIS — Z79899 Other long term (current) drug therapy: Secondary | ICD-10-CM | POA: Insufficient documentation

## 2018-05-22 DIAGNOSIS — R4182 Altered mental status, unspecified: Secondary | ICD-10-CM | POA: Diagnosis not present

## 2018-05-22 DIAGNOSIS — F209 Schizophrenia, unspecified: Secondary | ICD-10-CM

## 2018-05-22 DIAGNOSIS — F22 Delusional disorders: Secondary | ICD-10-CM

## 2018-05-22 DIAGNOSIS — R062 Wheezing: Secondary | ICD-10-CM | POA: Insufficient documentation

## 2018-05-22 NOTE — BH Assessment (Signed)
[facility] Assessment Progress Note  Per Shuvon Rankin, FNP, this pt does not require psychiatric hospitalization at this time.  Pt is to be discharged from [facility] with recommendation to follow up with [facility].  This has been included in pt's discharge instructions.  Pt's nurse, Edd Arbour, has been notified.  Jalene Mullet, [facility] Triage Specialist [phone]
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
## 2018-05-25 ENCOUNTER — Emergency Department (HOSPITAL_COMMUNITY): Payer: Medicare Other

## 2018-05-25 ENCOUNTER — Other Ambulatory Visit: Payer: Self-pay

## 2018-05-25 ENCOUNTER — Encounter (HOSPITAL_COMMUNITY): Payer: Self-pay | Admitting: Emergency Medicine

## 2018-05-25 ENCOUNTER — Inpatient Hospital Stay (HOSPITAL_COMMUNITY): Payer: Medicare Other

## 2018-05-25 ENCOUNTER — Inpatient Hospital Stay (HOSPITAL_COMMUNITY)
Admission: EM | Admit: 2018-05-25 | Discharge: 2018-05-31 | DRG: 091 | Disposition: A | Discharge status: Home / Self Care | Payer: Medicare Other | Attending: Internal Medicine | Admitting: Internal Medicine

## 2018-05-25 DIAGNOSIS — E1151 Type 2 diabetes mellitus with diabetic peripheral angiopathy without gangrene: Secondary | ICD-10-CM | POA: Diagnosis present

## 2018-05-25 DIAGNOSIS — Z9114 Patient's other noncompliance with medication regimen: Secondary | ICD-10-CM

## 2018-05-25 DIAGNOSIS — Z818 Family history of other mental and behavioral disorders: Secondary | ICD-10-CM | POA: Diagnosis not present

## 2018-05-25 DIAGNOSIS — I2693 Single subsegmental pulmonary embolism without acute cor pulmonale: Secondary | ICD-10-CM | POA: Diagnosis present

## 2018-05-25 DIAGNOSIS — G92 Toxic encephalopathy: Secondary | ICD-10-CM | POA: Diagnosis present

## 2018-05-25 DIAGNOSIS — Z7901 Long term (current) use of anticoagulants: Secondary | ICD-10-CM

## 2018-05-25 DIAGNOSIS — Z794 Long term (current) use of insulin: Secondary | ICD-10-CM

## 2018-05-25 DIAGNOSIS — F209 Schizophrenia, unspecified: Secondary | ICD-10-CM

## 2018-05-25 DIAGNOSIS — Z20828 Contact with and (suspected) exposure to other viral communicable diseases: Secondary | ICD-10-CM | POA: Diagnosis present

## 2018-05-25 DIAGNOSIS — Z915 Personal history of self-harm: Secondary | ICD-10-CM | POA: Diagnosis not present

## 2018-05-25 DIAGNOSIS — F940 Selective mutism: Secondary | ICD-10-CM | POA: Diagnosis present

## 2018-05-25 DIAGNOSIS — T43505A Adverse effect of unspecified antipsychotics and neuroleptics, initial encounter: Secondary | ICD-10-CM | POA: Diagnosis present

## 2018-05-25 DIAGNOSIS — I2699 Other pulmonary embolism without acute cor pulmonale: Secondary | ICD-10-CM | POA: Diagnosis present

## 2018-05-25 DIAGNOSIS — E11649 Type 2 diabetes mellitus with hypoglycemia without coma: Secondary | ICD-10-CM | POA: Diagnosis present

## 2018-05-25 DIAGNOSIS — E119 Type 2 diabetes mellitus without complications: Secondary | ICD-10-CM

## 2018-05-25 DIAGNOSIS — G210 Malignant neuroleptic syndrome: Secondary | ICD-10-CM | POA: Diagnosis present

## 2018-05-25 DIAGNOSIS — R000 Tachycardia, unspecified: Secondary | ICD-10-CM

## 2018-05-25 DIAGNOSIS — Z86711 Personal history of pulmonary embolism: Secondary | ICD-10-CM | POA: Diagnosis not present

## 2018-05-25 DIAGNOSIS — R4182 Altered mental status, unspecified: Secondary | ICD-10-CM | POA: Diagnosis not present

## 2018-05-25 DIAGNOSIS — Z91410 Personal history of adult physical and sexual abuse: Secondary | ICD-10-CM

## 2018-05-25 DIAGNOSIS — G9340 Encephalopathy, unspecified: Secondary | ICD-10-CM | POA: Diagnosis present

## 2018-05-25 DIAGNOSIS — G9341 Metabolic encephalopathy: Secondary | ICD-10-CM

## 2018-05-25 DIAGNOSIS — E1169 Type 2 diabetes mellitus with other specified complication: Secondary | ICD-10-CM

## 2018-05-25 DIAGNOSIS — F200 Paranoid schizophrenia: Secondary | ICD-10-CM | POA: Diagnosis present

## 2018-05-25 LAB — COMPREHENSIVE METABOLIC PANEL
ALT: 35 U/L (ref 0–44)
AST: 104 U/L — ABNORMAL HIGH (ref 15–41)
Albumin: 3.8 g/dL (ref 3.5–5.0)
Alkaline Phosphatase: 53 U/L (ref 38–126)
Anion gap: 16 — ABNORMAL HIGH (ref 5–15)
BUN: 16 mg/dL (ref 6–20)
CO2: 22 mmol/L (ref 22–32)
Calcium: 10.1 mg/dL (ref 8.9–10.3)
Chloride: 105 mmol/L (ref 98–111)
Creatinine, Ser: 0.87 mg/dL (ref 0.44–1.00)
GFR calc Af Amer: >60 mL/min (ref >60)
GFR calc non Af Amer: >60 mL/min (ref >60)
Glucose, Bld: 74 mg/dL (ref 70–99)
Potassium: 5.1 mmol/L (ref 3.5–5.1)
Sodium: 143 mmol/L (ref 135–145)
Total Bilirubin: 1.3 mg/dL — ABNORMAL HIGH (ref 0.3–1.2)
Total Protein: 7.1 g/dL (ref 6.5–8.1)

## 2018-05-25 LAB — CBC WITH DIFFERENTIAL/PLATELET
Abs Immature Granulocytes: 0.03 10*3/uL (ref 0.00–0.07)
Basophils Absolute: 0 10*3/uL (ref 0.0–0.1)
Basophils Relative: 0 %
Eosinophils Absolute: 0 10*3/uL (ref 0.0–0.5)
Eosinophils Relative: 0 %
HCT: 38.9 % (ref 36.0–46.0)
Hemoglobin: 12.4 g/dL (ref 12.0–15.0)
Immature Granulocytes: 0 %
Lymphocytes Relative: 7 %
Lymphs Abs: 0.8 10*3/uL (ref 0.7–4.0)
MCH: 32 pg (ref 26.0–34.0)
MCHC: 31.9 g/dL (ref 30.0–36.0)
MCV: 100.5 fL — ABNORMAL HIGH (ref 80.0–100.0)
Monocytes Absolute: 0.4 10*3/uL (ref 0.1–1.0)
Monocytes Relative: 4 %
Neutro Abs: 10 10*3/uL — ABNORMAL HIGH (ref 1.7–7.7)
Neutrophils Relative %: 89 %
Platelets: 305 10*3/uL (ref 150–400)
RBC: 3.87 MIL/uL (ref 3.87–5.11)
RDW: 13.1 % (ref 11.5–15.5)
WBC: 11.2 10*3/uL — ABNORMAL HIGH (ref 4.0–10.5)
nRBC: 0 % (ref 0.0–0.2)

## 2018-05-25 LAB — URINALYSIS, ROUTINE W REFLEX MICROSCOPIC
Bilirubin Urine: NEGATIVE
Glucose, UA: 50 mg/dL — AB
Hgb urine dipstick: NEGATIVE
Ketones, ur: 5 mg/dL — AB
Nitrite: NEGATIVE
Protein, ur: 30 mg/dL — AB
Specific Gravity, Urine: 1.027 (ref 1.005–1.030)
pH: 5 (ref 5.0–8.0)

## 2018-05-25 LAB — CK
Total CK: 1236 U/L — ABNORMAL HIGH (ref 38–234)
Total CK: 996 U/L — ABNORMAL HIGH (ref 38–234)

## 2018-05-25 LAB — RAPID URINE DRUG SCREEN, HOSP PERFORMED
Amphetamines: NOT DETECTED
Barbiturates: NOT DETECTED
Benzodiazepines: NOT DETECTED
Cocaine: NOT DETECTED
Opiates: NOT DETECTED
Tetrahydrocannabinol: NOT DETECTED

## 2018-05-25 LAB — HEPARIN LEVEL (UNFRACTIONATED)
Heparin Unfractionated: <0.1 IU/mL — ABNORMAL LOW (ref 0.30–0.70)
Heparin Unfractionated: 0.61 IU/mL (ref 0.30–0.70)

## 2018-05-25 LAB — TSH: TSH: 0.158 u[IU]/mL — ABNORMAL LOW (ref 0.350–4.500)

## 2018-05-25 LAB — ACETAMINOPHEN LEVEL: Acetaminophen (Tylenol), Serum: <10 ug/mL — ABNORMAL LOW (ref 10–30)

## 2018-05-25 LAB — SARS CORONAVIRUS 2 BY RT PCR (HOSPITAL ORDER, PERFORMED IN ~~LOC~~ HOSPITAL LAB): SARS Coronavirus 2: NEGATIVE

## 2018-05-25 LAB — CBG MONITORING, ED: Glucose-Capillary: 69 mg/dL — ABNORMAL LOW (ref 70–99)

## 2018-05-25 LAB — T4, FREE: Free T4: 1.07 ng/dL (ref 0.82–1.77)

## 2018-05-25 LAB — ETHANOL: Alcohol, Ethyl (B): <10 mg/dL (ref <10)

## 2018-05-25 LAB — LACTIC ACID, PLASMA: Lactic Acid, Venous: 1.6 mmol/L (ref 0.5–1.9)

## 2018-05-25 LAB — SALICYLATE LEVEL: Salicylate Lvl: <7 mg/dL (ref 2.8–30.0)

## 2018-05-25 LAB — GLUCOSE, CAPILLARY
Glucose-Capillary: 73 mg/dL (ref 70–99)
Glucose-Capillary: 199 mg/dL — ABNORMAL HIGH (ref 70–99)

## 2018-05-25 LAB — APTT: aPTT: 67 seconds — ABNORMAL HIGH (ref 24–36)

## 2018-05-25 MED ORDER — ONDANSETRON HCL 4 MG/2ML IJ SOLN: 4.0000 mg | Freq: Four times a day (QID) | INTRAMUSCULAR | Status: DC | PRN

## 2018-05-25 MED ORDER — ACETAMINOPHEN 325 MG PO TABS
650.0000 mg | ORAL_TABLET | Freq: Four times a day (QID) | ORAL | Status: DC | PRN
  Administered 2018-05-27: 650 mg via ORAL
  Filled 2018-05-25: qty 2

## 2018-05-25 MED ORDER — SODIUM CHLORIDE 0.9 % IV SOLN
INTRAVENOUS | Status: DC
  Administered 2018-05-26: 01:00:00 via INTRAVENOUS

## 2018-05-25 MED ORDER — LACTATED RINGERS IV BOLUS
1000.0000 mL | Freq: Once | INTRAVENOUS | Status: AC
  Administered 2018-05-25: 1000 mL via INTRAVENOUS

## 2018-05-25 MED ORDER — HEPARIN (PORCINE) 25000 UT/250ML-% IV SOLN
650.0000 [IU]/h | INTRAVENOUS | Status: DC
  Administered 2018-05-25 – 2018-05-28 (×3): 800 [IU]/h via INTRAVENOUS
  Filled 2018-05-25 (×3): qty 250

## 2018-05-25 MED ORDER — IOHEXOL 350 MG/ML SOLN
100.0000 mL | Freq: Once | INTRAVENOUS | Status: AC | PRN
  Administered 2018-05-25: 100 mL via INTRAVENOUS

## 2018-05-25 MED ORDER — DEXTROSE 50 % IV SOLN: 1.0000 | INTRAVENOUS | Status: DC | PRN

## 2018-05-25 MED ORDER — SODIUM CHLORIDE 0.9 % IV SOLN
INTRAVENOUS | Status: DC
  Administered 2018-05-25: 14:00:00 via INTRAVENOUS

## 2018-05-25 MED ORDER — LORAZEPAM 2 MG/ML IJ SOLN
1.0000 mg | Freq: Three times a day (TID) | INTRAMUSCULAR | Status: DC
  Filled 2018-05-25: qty 1

## 2018-05-25 MED ORDER — VANCOMYCIN HCL IN DEXTROSE 750-5 MG/150ML-% IV SOLN: 750.0000 mg | INTRAVENOUS | Status: DC

## 2018-05-25 MED ORDER — DEXTROSE-NACL 5-0.9 % IV SOLN
INTRAVENOUS | Status: DC
  Administered 2018-05-25: 17:00:00 via INTRAVENOUS

## 2018-05-25 MED ORDER — METRONIDAZOLE IN NACL 5-0.79 MG/ML-% IV SOLN
500.0000 mg | Freq: Once | INTRAVENOUS | Status: DC
  Filled 2018-05-25: qty 100

## 2018-05-25 MED ORDER — HEPARIN BOLUS VIA INFUSION
2500.0000 [IU] | Freq: Once | INTRAVENOUS | Status: AC
  Administered 2018-05-25: 2500 [IU] via INTRAVENOUS
  Filled 2018-05-25: qty 2500

## 2018-05-25 MED ORDER — VANCOMYCIN HCL IN DEXTROSE 1-5 GM/200ML-% IV SOLN
1000.0000 mg | Freq: Once | INTRAVENOUS | Status: DC
  Administered 2018-05-25: 1000 mg via INTRAVENOUS
  Filled 2018-05-25: qty 200

## 2018-05-25 MED ORDER — POLYETHYLENE GLYCOL 3350 17 G PO PACK: 17.0000 g | PACK | Freq: Every day | ORAL | Status: DC | PRN

## 2018-05-25 MED ORDER — SODIUM CHLORIDE 0.9 % IV SOLN: INTRAVENOUS | Status: DC | PRN

## 2018-05-25 MED ORDER — ONDANSETRON HCL 4 MG PO TABS: 4.0000 mg | ORAL_TABLET | Freq: Four times a day (QID) | ORAL | Status: DC | PRN

## 2018-05-25 MED ORDER — SODIUM CHLORIDE 0.9 % IV SOLN: 2.0000 g | Freq: Two times a day (BID) | INTRAVENOUS | Status: DC

## 2018-05-25 MED ORDER — LORAZEPAM 2 MG/ML IJ SOLN
1.0000 mg | Freq: Once | INTRAMUSCULAR | Status: AC
  Administered 2018-05-25: 1 mg via INTRAVENOUS
  Filled 2018-05-25: qty 1

## 2018-05-25 MED ORDER — SODIUM CHLORIDE 0.9 % IV SOLN
2.0000 g | Freq: Once | INTRAVENOUS | Status: AC
  Administered 2018-05-25: 2 g via INTRAVENOUS
  Filled 2018-05-25: qty 2

## 2018-05-25 MED ORDER — ACETAMINOPHEN 650 MG RE SUPP: 650.0000 mg | Freq: Four times a day (QID) | RECTAL | Status: DC | PRN

## 2018-05-25 NOTE — Progress Notes (Signed)
Debbie Dorsey [id number] Admission Data: 05/25/2018 6:43 PM Attending Provider: Oda Kilts, MD  MHD:[id number], [name], MD Consults/ Treatment Team:   Debbie Dorsey is a 48 y.o. female patient admitted from ED awake, alert  & orientated  X 3,  Full Code, VSS - Blood pressure 121/81, pulse (!) 101, temperature 99.6 F (37.6 C), temperature source Axillary, resp. rate 16, height 5' (1.524 m), weight 45 kg, SpO2 98 %, unknown if currently breastfeeding.,  no c/o shortness of breath, no c/o chest pain, no distress noted. Tele # 38 placed and pt is currently running:normal sinus rhythm.   IV site WDL:  antecubital right, condition patent and no redness and left, condition patent and no redness with a transparent dsg that's clean dry and intact.  Allergies:  No Known Allergies   Past Medical History:  Diagnosis Date  . Diabetes mellitus without complication ([place])     History:  obtained from chart review. Tobacco/alcohol: denied none  Pt orientation to unit, room and routine. Information packet given to patient/family and safety video watched.  Admission INP armband ID verified with patient/family, and in place. SR up x 2, fall risk assessment complete with Patient and family verbalizing understanding of risks associated with falls. Pt verbalizes an understanding of how to use the call bell and to call for help before getting out of bed.  Skin, clean-dry- intact without evidence of bruising, or skin tears.   No evidence of skin break down noted on exam. no rashes, no ecchymoses, no petechiae, no nodules, no jaundice, no purpura, no wounds. Patient lethargic, will open eyes but otherwise not speaking. Phone call to legal guardian Teryl Lucy made by Cristino Martes, RN     Will cont to monitor and assist as needed.  Salley Slaughter, RN 05/25/2018 6:43 PM

## 2018-05-25 NOTE — H&P (Signed)
Date: 05/25/2018               Patient Name:  Debbie Dorsey MRN: [id number]  DOB: 18-Aug-1970 Age / Sex: 48 y.o., female   PCP: Debbie Solian, MD         Medical Service: Internal Medicine Teaching Service         Attending Physician: Dr. [name] Alert Raynaldo Opitz, MD    First Contact: Dr. Myrtie Dorsey Pager: [phone]  Second Contact: Dr. Reesa Dorsey  Pager: [phone]       After Hours (After 5p/  First Contact Pager: [phone]  weekends / holidays): Second Contact Pager: [phone]   Chief Complaint: Altered mental status   History of Present Illness:  Debbie Dorsey is a 48 year old female with a history of schizophrenia, recent small, bilateral pulmonary embolism off anticoagulation, and insulin-dependent T2DM who presents with altered mental status. Patient lives at home with her legal guardian, grandfather Debbie Dorsey Kitchen. She was not interactive during our encounter and HPI obtained from family. She was in her usual state of health yesterday and did not endorse any symptoms. This morning she did not wake up at her usual time. When her grandfather went to check up on her he found her down next to the bed and covered in her own urine. He checked her BG which was 79 and proceeded to call EMS.   She does not have a history of seizures. She takes Risperdal, temazepam PRN, and benztropine. Her caretaker manages these medications and states she does not have access to them. Of note, she was evaluated by a psychiatry NP 3 days ago due to altered behavior ("patient looked like a zombie" per caretaker) and there was suspicion of incorrect medication intake, specifically Risperdal, due to several medication changes recently. Her Risperdal was decreased to 2mg  BID from 3mg  BID and cogentin increased to 1 mg BID. There is also concern her caretaker is having difficulty managing her medications.  She does not use alcohol, tobacco, illegal drugs or supplements. Caretaker denies history of HI/SI/SA, but there is  documentation of SI during recent [facility] admission in 03/2018 and patient has a history of 2 SA in the past.   In the ED, she was found to have a low grade fever with rectal T 100, tachycardia with HR 120s, and tachypnea with RR 28. She was normotensive and oxygenating well on RA. She is supposed to be on Xarelto for recent PE, but caretaker states she only took this for 2 weeks and it was discontinued by her PCP, it is unclear why. Her blood work was remarkable for small leukocytosis of 11.2, and anion gap of 16.  Acetaminophen, ethanol, salicylate, and UDS were negative. UA showed rare bacteria, trace leukocytes, protein, and ketones.  Head CT was unremarkable.  She was started on broad-spectrum antibiotics with vancomycin, cefepime, and metronidazole and received 2L of LRs.   Meds:  Risperdal 2 mg BID Temazepam 7.5 mg QHS PRN  Benztropine 1 mg BID  Humulin 75/25 32 units BID    Allergies: Allergies as of 05/25/2018   (No Known Allergies)   Past Medical History:  Diagnosis Date   Diabetes mellitus without complication ([place])     Family History: Legal guardian states there is no family history.   Social History: Lives at home with grandfather who is his caretaker.  To complete ADLs independently.  Unemployed, on disability.  Does not use alcohol, tobacco, or illegal drugs per caretaker. Of note, documentation the  patient moved from [place] 6 months ago, but caretaker states that she is never of the night states.  Review of Systems: A complete ROS was negative except as per HPI.   Physical Exam: Blood pressure 116/79, pulse (!) 122, temperature (S) 100 F (37.8 C), temperature source Rectal, resp. rate 19, weight 45 kg, SpO2 98 %, unknown if currently breastfeeding.  General: Well-developed, well-nourished female, awake but not interactive with staff, restless HENT: NCAT, unable to examine ear nose and throat as patient was uncooperative Eyes: Unable to do eye exam Cardiac:  Tachycardic, nl S1/S2, no murmurs, rubs or gallops  Pulm: CTAB, no wheezes or crackles, no increased work of breathing  Abd: soft, NTND, bowel sounds present Neuro: Patient is awake but not interactive and not following commands.  Neuro examination is not optimal due to poor patient cooperation.  She does move her 4 extremities purposefully.  I did not notice rigidity, clonus, hyperreflexia, or hypertonia. Ext: warm and well perfused, no peripheral edema, 2+ DP pulses bilaterally  Derm: No rashes or lesions Psych: patient does not interact with staff, appears restless but does not voice any complaints    EKG: personally reviewed my interpretation is sinus tachycardia, nl intervals, no ST changes or signs of acute ischemia   CXR: personally reviewed my interpretation is unremarkable without signs of active infection   Assessment & Plan by Problem: Active Problems:   Acute encephalopathy  # Acute metabolic encephalopathy of unclear etiology: High suspicion for medication side effect given recent changes in medication doses and possible medication mismanagement by caretaker. Will plan to hold all psychiatric meds until improvement in mental status. Low suspicion for an infectious etiology at this time (CXR was unremarkable and UA not suggestive of infection). Will discontinue antibiotic therapy for now. Also low suspicion for CVA with negative head CT. Family denies history of seizures. DDx also includes serotonin syndrome and NMS but she does not meet Hunter criteria and there is no rigidity, hypertonia, and hyperreflexia on exam. Can also consider catatonia and try an Ativan challenge if patient does not improve with above measures.  - Hold Risperdal, Cogentin, and temazepam - NS @ 125 cc/hr  - Tylenol 650 mg q6h PRN for fever  - Follow up CK and TSH (initally ordered for evaluation of tachycardia but this is most likely associated with acute PE) - Follow up blood cultures   # History of  small, bilateral PE in 04/06/2018 # Acute RLL PE  Previous note from 03/2018 state PE was provoked though unclear how. Hypercoagulable workup was negative then. She is supposed to be on Xarelto but caretaker states this was discontinued by her PCP after being on it for 2 weeks.  She is presenting today with a low-grade fever, tachycardic and tachypnea concerning for PE.  Geneva score is 8= moderate risk.  Therefore, CTA chest was ordered and revealed an acute right lower lobe segmental and subsegmental pulmonary emboli. No heart strain. - IV heparin while NPO - Can transition to [place] when mental status improves as she did not fail DOAC  - Cardiac monitoring   # Schizophrenia:  Follows up with [facility]. Has had several admissions to [facility] due to paranoid delusions, hallucinations and SI. This has led to several changes in medication doses. Per most recent note from 05/22/2018 she is supposed to be on Risperdal 2 mg BID, Cogentin 1 mg BID and temazepam 7.5 mg PRN for sleep. Currently holding all meds in the setting of acute encephalopathy.  -  Holding Risperdal, Cogentin and temazepam   # Insulin-dependent T2DM: Found to be hypoglycemic on arrival to the ED likely due to no oral intake while continuing to use insulin. - D50 PRN  - Holding insulin    Fluids: NS @ 125cc/hr  Diet: NPO  VTE ppx: IV heparin  Code status: Full code, confirmed on admission   PATIENT HAS LEGAL GUARDIANTeryl Lucy [phone]  Dispo: Admit patient to Inpatient with expected length of stay greater than 2 midnights.  SignedWelford Roche, MD 05/25/2018, 12:32 PM  Pager: [phone]

## 2018-05-25 NOTE — ED Notes (Signed)
Patient transported to CT 

## 2018-05-25 NOTE — ED Provider Notes (Signed)
[place] EMERGENCY DEPARTMENT Provider Note   CSN: [id number] Arrival date & time: 05/25/18  0848    History   Chief Complaint Chief Complaint  Patient presents with  . Fever  . Altered Mental Status   Level 5 caveat due to altered mental status. HPI Debbie Dorsey is a 48 y.o. female.      Fever  Altered Mental Status  Associated symptoms: fever   Patient brought in for altered mental status.  Reportedly found at home by grandparents.  Reportedly has had fever for the last few weeks.  Reportedly had urinary incontinence and is being treated for urinary tract infection.  Around 1 month ago was discharged from [facility] with treatment for schizophrenia.  Patient cannot provide any history at this time.  Patient is supposed to be on Xarelto for pulmonary embolisms.  Past Medical History:  Diagnosis Date  . Diabetes mellitus without complication [facility])     Patient Active Problem List   Diagnosis Date Noted  . Schizophrenia ([place]) 04/08/2018  . Paranoid schizophrenia ([place]) 04/02/2018    History reviewed. No pertinent surgical history.   OB History    Gravida  1   Para      Term      Preterm      AB      Living        SAB      TAB      Ectopic      Multiple      Live Births               Home Medications    Prior to Admission medications   Medication Sig Start Date End Date Taking? Authorizing Provider  benztropine (COGENTIN) 0.5 MG tablet Take 1 tablet (0.5 mg total) by mouth 2 (two) times daily. 04/09/18   Johnn Hai, MD  busPIRone (BUSPAR) 5 MG tablet Take 5 mg by mouth 3 (three) times daily. 04/16/18   [provider]  HUMALOG MIX 75/25 KWIKPEN (75-25) 100 UNIT/ML Kwikpen Inject 32-34 Units into the skin See admin instructions. Take 32 units in the evening and 34 units in the morning 01/21/18   [provider]  Multiple Vitamins-Minerals (MULTIVITAL) CHEW Chew 2 each by mouth daily.     [provider]  naproxen (EC NAPROSYN) 500 MG EC tablet Take 1 tablet (500 mg total) by mouth 2 (two) times daily with a meal. Patient not taking: Reported on 04/19/2018 11/14/16   Shelda Pal, DO  risperiDONE (RISPERDAL) 4 MG tablet Take 1 tablet (4 mg total) by mouth at bedtime. 04/09/18   Johnn Hai, MD  Rivaroxaban (XARELTO) 15 MG TABS tablet Take 1 tablet (15 mg total) by mouth 2 (two) times daily with a meal. 04/09/18   Johnn Hai, MD  rivaroxaban (XARELTO) 20 MG TABS tablet Take 1 tablet (20 mg total) by mouth daily. Patient not taking: Reported on 04/19/2018 04/28/18   Shelly Coss, MD  simvastatin (ZOCOR) 40 MG tablet Take 40 mg by mouth daily at 12 noon.     [provider]  temazepam (RESTORIL) 15 MG capsule Take 1 capsule (15 mg total) by mouth at bedtime as needed for sleep. 04/09/18   Johnn Hai, MD    Family History Family History  Problem Relation Age of Onset  . Mental illness Sister     Social History Social History   Tobacco Use  . Smoking status: Never Smoker  . Smokeless tobacco:  Never Used  Substance Use Topics  . Alcohol use: No  . Drug use: No     Allergies   Patient has no known allergies.   Review of Systems Review of Systems  Unable to perform ROS: Mental status change  Constitutional: Positive for fever.     Physical Exam Updated Vital Signs BP 111/77   Pulse (!) 121   Temp (S) 100 F (37.8 C) (Rectal)   Resp 19   Wt 45 kg   SpO2 98%   BMI 19.38 kg/m   Physical Exam Vitals signs and nursing note reviewed. Exam conducted with a chaperone present.  Constitutional:      Comments: In bed with eyes held closed.  Eyes:     Comments: Patient is holding her eyes closed and will not allow me to evaluate her pupils.  Neck:     Musculoskeletal: Neck supple.  Cardiovascular:     Rate and Rhythm: Tachycardia present.  Pulmonary:     Breath sounds: No wheezing, rhonchi or rales.  Abdominal:     General: There  is no distension.     Tenderness: There is no abdominal tenderness.  Musculoskeletal:     Right lower leg: No edema.     Left lower leg: No edema.  Skin:    General: Skin is warm.     Capillary Refill: Capillary refill takes less than 2 seconds.  Neurological:     Comments: Sitting in bed with eyes closed.  Will not allow me to open eyes.  Nonverbal.  Does not have much response to pain.  Not hyperreflexive.  Breathing spontaneously.      ED Treatments / Results  Labs (all labs ordered are listed, but only abnormal results are displayed) Labs Reviewed  COMPREHENSIVE METABOLIC PANEL - Abnormal; Notable for the following components:      Result Value   AST 104 (*)    Total Bilirubin 1.3 (*)    Anion gap 16 (*)    All other components within normal limits  CBC WITH DIFFERENTIAL/PLATELET - Abnormal; Notable for the following components:   WBC 11.2 (*)    MCV 100.5 (*)    Neutro Abs 10.0 (*)    All other components within normal limits  URINALYSIS, ROUTINE W REFLEX MICROSCOPIC - Abnormal; Notable for the following components:   APPearance HAZY (*)    Glucose, UA 50 (*)    Ketones, ur 5 (*)    Protein, ur 30 (*)    Leukocytes,Ua TRACE (*)    Bacteria, UA RARE (*)    All other components within normal limits  ACETAMINOPHEN LEVEL - Abnormal; Notable for the following components:   Acetaminophen (Tylenol), Serum <10 (*)    All other components within normal limits  CBG MONITORING, ED - Abnormal; Notable for the following components:   Glucose-Capillary 69 (*)    All other components within normal limits  SARS CORONAVIRUS 2 (HOSPITAL ORDER, [place] [facility])  CULTURE, BLOOD (ROUTINE X 2)  CULTURE, BLOOD (ROUTINE X 2)  URINE CULTURE  LACTIC ACID, PLASMA  ETHANOL  RAPID URINE DRUG SCREEN, HOSP PERFORMED  SALICYLATE LEVEL  LACTIC ACID, PLASMA  HIV ANTIBODY (ROUTINE TESTING W REFLEX)    EKG EKG Interpretation  Date/Time:  Saturday May 25 2018  08:55:38 EDT Ventricular Rate:  130 PR Interval:    QRS Duration: 70 QT Interval:  310 QTC Calculation: 456 R Axis:   23 Text Interpretation:  Sinus tachycardia Confirmed by Alvino Chapel,  Ovid Curd [phone]) on 05/25/2018 9:40:58 AM   Radiology Ct Head Wo Contrast  Result Date: 05/25/2018 CLINICAL DATA:  Altered mental status. Currently being treated for urinary tract infection. History of schizophrenia. EXAM: CT HEAD WITHOUT CONTRAST TECHNIQUE: Contiguous axial images were obtained from the base of the skull through the vertex without intravenous contrast. COMPARISON:  None. FINDINGS: Brain: There is no evidence of acute infarct, intracranial hemorrhage, mass, midline shift, or extra-axial fluid collection. The ventricles and sulci are normal. Vascular: No hyperdense vessel. Skull: No fracture or focal osseous lesion. Sinuses/Orbits: Visualized paranasal sinuses and mastoid air cells are clear. Visualized orbits are unremarkable. Other: None. IMPRESSION: Negative head CT. Electronically Signed   By: Logan Bores M.D.   On: 05/25/2018 10:15   Dg Chest Port 1 View  Result Date: 05/25/2018 CLINICAL DATA:  Altered mental status.  Urinary tract infection EXAM: PORTABLE CHEST 1 VIEW COMPARISON:  None. FINDINGS: Normal mediastinum and cardiac silhouette. Normal pulmonary vasculature. No evidence of effusion, infiltrate, or pneumothorax. No acute bony abnormality. IMPRESSION: Normal chest radiograph. Electronically Signed   By: Suzy Bouchard M.D.   On: 05/25/2018 10:00    Procedures Procedures (including critical care time)  Medications Ordered in ED Medications  ceFEPIme (MAXIPIME) 2 g in sodium chloride 0.9 % 100 mL IVPB (has no administration in time range)  metroNIDAZOLE (FLAGYL) IVPB 500 mg (has no administration in time range)  vancomycin (VANCOCIN) IVPB 1000 mg/200 mL premix (has no administration in time range)  ceFEPIme (MAXIPIME) 2 g in sodium chloride 0.9 % 100 mL IVPB (has no  administration in time range)  vancomycin (VANCOCIN) IVPB 750 mg/150 ml premix (has no administration in time range)  lactated ringers bolus 1,000 mL (1,000 mLs Intravenous New Bag/Given 05/25/18 0922)  lactated ringers bolus 1,000 mL (1,000 mLs Intravenous New Bag/Given 05/25/18 1031)     Initial Impression / Assessment and Plan / ED Course  I have reviewed the triage vital signs and the nursing notes.  Pertinent labs & imaging results that were available during my care of the patient were reviewed by me and considered in my medical decision making (see chart for details).        Patient with altered mental status.  Question of fever although temperature is 100 rectally here.  Reportedly had a urinary tract infection but I am unable to get more information about this.  Urine does show white cells but only rare bacteria.  Patient initially somewhat unresponsive but could be a psychiatric cause of this.  She was initially just holding her eyes closed.  Now her eyes are open and will look to use some still not speak or follow any commands.  Has tachycardia but reviewing records has had some issues with tachycardia recently.  Overdose felt less likely.  Drug screen reassuring.  Considered serotonin syndrome or neuroleptic malignant syndrome.  Does not have any stiffness or rigidity in her muscles and I consider less likely.  Initially started on undifferentiated sepsis antibiotics.  Normal lactic acid.  This is not definitely an infection however.  X-ray reassuring and head CT reassuring.  Done since she is post be on anticoagulation.  Within the last month has had a CT angiography of the chest to evaluate for pulmonary embolism.  Feel this is also less likely.  Will admit to unassigned internal medicine.  CRITICAL CARE Performed by: Davonna Belling Total critical care time: 30 minutes Critical care time was exclusive of separately billable procedures and treating other patients. Critical  care was  necessary to treat or prevent imminent or life-threatening deterioration. Critical care was time spent personally by me on the following activities: development of treatment plan with patient and/or surrogate as well as nursing, discussions with consultants, evaluation of patient's response to treatment, examination of patient, obtaining history from patient or surrogate, ordering and performing treatments and interventions, ordering and review of laboratory studies, ordering and review of radiographic studies, pulse oximetry and re-evaluation of patient's condition.   Final Clinical Impressions(s) / ED Diagnoses   Final diagnoses:  Encephalopathy  Tachycardia    ED Discharge Orders    None       Davonna Belling, MD 05/25/18 1056

## 2018-05-25 NOTE — ED Notes (Signed)
Internal med resident at bedside.

## 2018-05-25 NOTE — Progress Notes (Addendum)
Asked to reassess patient by the day team. Exam deferred as patient was sleeping. According to sign out from the day team, Debbie Dorsey was lethargic and unresponsive prior to receiving Ativan around 5pm. The evening RN reported that after receiving the ativan, she would wake up briefly and moan, but did not speak or interact meaningfully with the medical team. This may represent a positive response to the ativan and catatonic symptoms, although the differential for her global encephalopathy remains broad at this time.   Plan (discussed with Dr. [name] Alert) - Defer further brain imaging including MRI and EEG to day team - Schedule ativan 1mg  TID starting tomorrow for catatonia  - Patient will be transferred to [facility] in the morning. The day team should consider psychiatry consultation.

## 2018-05-25 NOTE — Progress Notes (Signed)
ANTICOAGULATION CONSULT NOTE - Initial Consult  Pharmacy Consult for heparin Indication: pulmonary embolus  No Known Allergies  Patient Measurements: Weight: 99 lb 3.3 oz (45 kg) Heparin Dosing Weight: 45kg  Vital Signs: Temp: 100 F (37.8 C) (04/18 0912) Temp Source: Rectal (04/18 0912) BP: 116/79 (04/18 1115) Pulse Rate: 122 (04/18 1115)  Labs: Recent Labs    05/25/18 0914  HGB 12.4  HCT 38.9  PLT 305  CREATININE 0.87    Estimated Creatinine Clearance: 57.4 mL/min (by C-G formula based on SCr of 0.87 mg/dL).   Medical History: Past Medical History:  Diagnosis Date  . Diabetes mellitus without complication ([facility])     Medications:  Infusions:  . sodium chloride    . heparin      Assessment: 32 yof presented to the ED with AMS. She is prescribed xarelto for a recent PE but has likely either been non-compliant or provider stopped it. Will plan to check a baseline anti-Xa level to see if it in her system at all. She is also found to have an acute PE. Baseline CBC is WNL. No bleeding noted. Will proceed with heparin bolus and drip while awaiting results of baseline anti-Xa level.   Goal of Therapy:  Heparin level 0.3-0.7 units/ml aPTT 66-103 seconds Monitor platelets by anticoagulation protocol: Yes   Plan:  Heparin bolus 2500 units IV x 1 Heparin gtt 800 units/hr Check a 6 hr heparin level and aPTT Daily heparin level, aPTT and CBC  Debbie Dorsey, Rande Lawman 05/25/2018,12:25 PM

## 2018-05-25 NOTE — ED Notes (Addendum)
Attempted to call report x 1  

## 2018-05-25 NOTE — ED Triage Notes (Signed)
Patient arrives via [facility] from home, ems reports that patient's grandparents went to check on her and found her altered. Patient currently undergoing abx therapy for UTI and family reports she has had a fever for the past few weeks. Patient was incontinent of urine which is abnormal for her. Patient was also recently released from [facility] r/t her schizophrenia. Tachycardic in the 120s with EMS. resp e/u, nad.

## 2018-05-25 NOTE — Progress Notes (Signed)
Pharmacy Antibiotic Note  Debbie Dorsey is a 48 y.o. female admitted on 05/25/2018 with sepsis.  Pharmacy has been consulted for vancomycin and cefepime dosing. Pt with Tmax 100 and WBC is mildly elevated at 11.2. Scr and lactic acid are WNL.   Plan: Vancomycin 1gm IV x 1 then 750mg  IV Q24H Cefepime 2gm IV Q12H F/u renal fxn, C&S, clinical status and peak/trough at SS  Weight: 99 lb 3.3 oz (45 kg)  Temp (24hrs), Avg:100 F (37.8 C), Min:100 F (37.8 C), Max:100 F (37.8 C)  Recent Labs  Lab 05/25/18 0914  WBC 11.2*  CREATININE 0.87  LATICACIDVEN 1.6    Estimated Creatinine Clearance: 57.4 mL/min (by C-G formula based on SCr of 0.87 mg/dL).    No Known Allergies  Antimicrobials this admission: Vanc 4/18>> Cefepime 4/18>> Flagyl x 1 4/18  Dose adjustments this admission: N/A  Microbiology results: Pending   Thank you for allowing pharmacy to be a part of this patient's care.  Robbyn Hodkinson, Rande Lawman 05/25/2018 10:25 AM

## 2018-05-25 NOTE — ED Notes (Signed)
ED Provider at bedside. 

## 2018-05-25 NOTE — ED Notes (Signed)
ED TO INPATIENT HANDOFF REPORT  ED Nurse Name and Phone #: [phone]  S Name/Age/Gender Debbie Dorsey 48 y.o. female Room/Bed: 023C/023C  Code Status   Code Status: Full Code  Home/SNF/Other Home Patient oriented to: self Is this baseline? No   Triage Complete: Triage complete  Chief Complaint AMS; Fever  Triage Note Patient arrives via [facility] from home, ems reports that patient's grandparents went to check on her and found her altered. Patient currently undergoing abx therapy for UTI and family reports she has had a fever for the past few weeks. Patient was incontinent of urine which is abnormal for her. Patient was also recently released from [facility] r/t her schizophrenia. Tachycardic in the 120s with EMS. resp e/u, nad.    Allergies No Known Allergies  Level of Care/Admitting Diagnosis ED Disposition    ED Disposition Condition [facility] Area: [place] [[id number]]  Level of Care: Telemetry Medical [104]  Covid Evaluation: N/A  Diagnosis: Acute encephalopathy [761607]  Admitting Physician: Oda Kilts [[id number]]  Attending Physician: Oda Kilts [[id number]]  Estimated length of stay: past midnight tomorrow  Certification:: I certify this patient will need inpatient services for at least 2 midnights  PT Class (Do Not Modify): Inpatient [101]  PT Acc Code (Do Not Modify): Private [1]       B Medical/Surgery History Past Medical History:  Diagnosis Date  . Diabetes mellitus without complication ([place])    History reviewed. No pertinent surgical history.   A IV Location/Drains/Wounds Patient Lines/Drains/Airways Status   Active Line/Drains/Airways    Name:   Placement date:   Placement time:   Site:   Days:   Peripheral IV 05/25/18 Left Antecubital   05/25/18    0857    Antecubital   less than 1   Peripheral IV 05/25/18 Right Antecubital   05/25/18    1133    Antecubital   less than 1           Intake/Output Last 24 hours  Intake/Output Summary (Last 24 hours) at 05/25/2018 1243 Last data filed at 05/25/2018 7035 Gross per 24 hour  Intake -  Output 100 ml  Net -100 ml    Labs/Imaging Results for orders placed or performed during the hospital encounter of 05/25/18 (from the past 48 hour(s))  CBG monitoring, ED     Status: Abnormal   Collection Time: 05/25/18  8:56 AM  Result Value Ref Range   Glucose-Capillary 69 (L) 70 - 99 mg/dL  Lactic acid, plasma     Status: None   Collection Time: 05/25/18  9:14 AM  Result Value Ref Range   Lactic Acid, Venous 1.6 0.5 - 1.9 mmol/L    Comment: Performed at [facility], [place] [address], [place], [place] [address]  Comprehensive metabolic panel     Status: Abnormal   Collection Time: 05/25/18  9:14 AM  Result Value Ref Range   Sodium 143 135 - 145 mmol/L   Potassium 5.1 3.5 - 5.1 mmol/L   Chloride 105 98 - 111 mmol/L   CO2 22 22 - 32 mmol/L   Glucose, Bld 74 70 - 99 mg/dL   BUN 16 6 - 20 mg/dL   Creatinine, Ser 0.87 0.44 - 1.00 mg/dL   Calcium 10.1 8.9 - 10.3 mg/dL   Total Protein 7.1 6.5 - 8.1 g/dL   Albumin 3.8 3.5 - 5.0 g/dL   AST 104 (H) 15 - 41 U/L  ALT 35 0 - 44 U/L   Alkaline Phosphatase 53 38 - 126 U/L   Total Bilirubin 1.3 (H) 0.3 - 1.2 mg/dL   GFR calc non Af Amer >60 >60 mL/min   GFR calc Af Amer >60 >60 mL/min   Anion gap 16 (H) 5 - 15    Comment: Performed at [facility] [address] [place], [place] [address]  CBC WITH DIFFERENTIAL     Status: Abnormal   Collection Time: 05/25/18  9:14 AM  Result Value Ref Range   WBC 11.2 (H) 4.0 - 10.5 K/uL   RBC 3.87 3.87 - 5.11 MIL/uL   Hemoglobin 12.4 12.0 - 15.0 g/dL   HCT 38.9 36.0 - 46.0 %   MCV 100.5 (H) 80.0 - 100.0 fL   MCH 32.0 26.0 - 34.0 pg   MCHC 31.9 30.0 - 36.0 g/dL   RDW 13.1 11.5 - 15.5 %   Platelets 305 150 - 400 K/uL    Comment: REPEATED TO VERIFY SPECIMEN CHECKED FOR CLOTS    nRBC 0.0 0.0 - 0.2 %   Neutrophils Relative % 89  %   Neutro Abs 10.0 (H) 1.7 - 7.7 K/uL   Lymphocytes Relative 7 %   Lymphs Abs 0.8 0.7 - 4.0 K/uL   Monocytes Relative 4 %   Monocytes Absolute 0.4 0.1 - 1.0 K/uL   Eosinophils Relative 0 %   Eosinophils Absolute 0.0 0.0 - 0.5 K/uL   Basophils Relative 0 %   Basophils Absolute 0.0 0.0 - 0.1 K/uL   Immature Granulocytes 0 %   Abs Immature Granulocytes 0.03 0.00 - 0.07 K/uL    Comment: Performed at [facility], [place] [address] [place], [place] [address]  Urinalysis, Routine w reflex microscopic     Status: Abnormal   Collection Time: 05/25/18  9:14 AM  Result Value Ref Range   Color, Urine YELLOW YELLOW   APPearance HAZY (A) CLEAR   Specific Gravity, Urine 1.027 1.005 - 1.030   pH 5.0 5.0 - 8.0   Glucose, UA 50 (A) NEGATIVE mg/dL   Hgb urine dipstick NEGATIVE NEGATIVE   Bilirubin Urine NEGATIVE NEGATIVE   Ketones, ur 5 (A) NEGATIVE mg/dL   Protein, ur 30 (A) NEGATIVE mg/dL   Nitrite NEGATIVE NEGATIVE   Leukocytes,Ua TRACE (A) NEGATIVE   RBC / HPF 0-5 0 - 5 RBC/hpf   WBC, UA 21-50 0 - 5 WBC/hpf   Bacteria, UA RARE (A) NONE SEEN   Squamous Epithelial / LPF 0-5 0 - 5   Mucus PRESENT     Comment: Performed at [facility], [address] [place], [place] [address]  Ethanol     Status: None   Collection Time: 05/25/18  9:14 AM  Result Value Ref Range   Alcohol, Ethyl (B) <10 <10 mg/dL    Comment: (NOTE) Lowest detectable limit for serum alcohol is 10 mg/dL. For medical purposes only. Performed at [facility], [place] [address], [place], [place] [address]   Urine rapid drug screen (hosp performed)     Status: None   Collection Time: 05/25/18  9:14 AM  Result Value Ref Range   Opiates NONE DETECTED NONE DETECTED   Cocaine NONE DETECTED NONE DETECTED   Benzodiazepines NONE DETECTED NONE DETECTED   Amphetamines NONE DETECTED NONE DETECTED   Tetrahydrocannabinol NONE DETECTED NONE DETECTED   Barbiturates NONE DETECTED NONE DETECTED    Comment: (NOTE) DRUG  SCREEN FOR MEDICAL PURPOSES ONLY.  IF CONFIRMATION IS NEEDED FOR ANY PURPOSE, NOTIFY LAB WITHIN 5 DAYS. LOWEST DETECTABLE LIMITS FOR URINE DRUG SCREEN Drug Class                     Cutoff (ng/mL) Amphetamine and metabolites    1000 Barbiturate and metabolites    200 Benzodiazepine                 086 Tricyclics and metabolites     300 Opiates and metabolites        300 Cocaine and metabolites        300 THC                            50 Performed at [facility], [place] [address], [place], [place] [address]   Acetaminophen level     Status: Abnormal   Collection Time: 05/25/18  9:14 AM  Result Value Ref Range   Acetaminophen (Tylenol), Serum <10 (L) 10 - 30 ug/mL    Comment: (NOTE) Therapeutic concentrations vary significantly. A range of 10-30 ug/mL  may be an effective concentration for many patients. However, some  are best treated at concentrations outside of this range. Acetaminophen concentrations >150 ug/mL at 4 hours after ingestion  and >50 ug/mL at 12 hours after ingestion are often associated with  toxic reactions. Performed at [facility], [place] [address], [place], [place] [address]   Salicylate level     Status: None   Collection Time: 05/25/18  9:14 AM  Result Value Ref Range   Salicylate Lvl <7.1 2.8 - 30.0 mg/dL    Comment: Performed at [facility] [address], [place], [place] [address]  SARS Coronavirus 2 [facility] order, Performed in [facility] hospital lab)     Status: None   Collection Time: 05/25/18  9:26 AM  Result Value Ref Range   SARS Coronavirus 2 NEGATIVE NEGATIVE    Comment: (NOTE) If result is NEGATIVE SARS-CoV-2 target nucleic acids are NOT DETECTED. The SARS-CoV-2 RNA is generally detectable in upper and lower  respiratory specimens during the acute phase of infection. The lowest  concentration of SARS-CoV-2 viral copies this assay can detect is 250  copies / mL. A negative result does not preclude SARS-CoV-2  infection  and should not be used as the sole basis for treatment or other  patient management decisions.  A negative result may occur with  improper specimen collection / handling, submission of specimen other  than nasopharyngeal swab, presence of viral mutation(s) within the  areas targeted by this assay, and inadequate number of viral copies  (<250 copies / mL). A negative result must be combined with clinical  observations, patient history, and epidemiological information. If result is POSITIVE SARS-CoV-2 target nucleic acids are DETECTED. The SARS-CoV-2 RNA is generally detectable in upper and lower  respiratory specimens dur ing the acute phase of infection.  Positive  results are indicative of active infection with SARS-CoV-2.  Clinical  correlation with patient history and other diagnostic information is  necessary to determine patient infection status.  Positive results do  not rule out bacterial infection or co-infection with other viruses. If result is PRESUMPTIVE POSTIVE SARS-CoV-2 nucleic acids MAY BE PRESENT.   A presumptive positive result was obtained on the submitted specimen  and confirmed on repeat testing.  While 2019 novel coronavirus  (SARS-CoV-2) nucleic acids may be present in the submitted sample  additional confirmatory testing may be necessary for epidemiological  and / or clinical management purposes  to differentiate between  SARS-CoV-2 and other Sarbecovirus currently known to infect humans.  If clinically indicated additional testing with an alternate test  methodology [phone]) is advised. The SARS-CoV-2 RNA is generally  detectable in upper and lower respiratory sp ecimens during the acute  phase of infection. The expected result is Negative. Fact Sheet for Patients:  StrictlyIdeas.no Fact Sheet for Healthcare Providers: BankingDealers.co.za This test is not yet approved or cleared by the [place]  FDA and has been authorized for detection and/or diagnosis of SARS-CoV-2 by FDA under an Emergency Use Authorization (EUA).  This EUA will remain in effect (meaning this test can be used) for the duration of the COVID-19 declaration under Section 564(b)(1) of the Act, 21 U.S.C. section 360bbb-3(b)(1), unless the authorization is terminated or revoked sooner. Performed at [facility], [place] [address] [place], [place] [address]    Ct Head Wo Contrast  Result Date: 05/25/2018 CLINICAL DATA:  Altered mental status. Currently being treated for urinary tract infection. History of schizophrenia. EXAM: CT HEAD WITHOUT CONTRAST TECHNIQUE: Contiguous axial images were obtained from the base of the skull through the vertex without intravenous contrast. COMPARISON:  None. FINDINGS: Brain: There is no evidence of acute infarct, intracranial hemorrhage, mass, midline shift, or extra-axial fluid collection. The ventricles and sulci are normal. Vascular: No hyperdense vessel. Skull: No fracture or focal osseous lesion. Sinuses/Orbits: Visualized paranasal sinuses and mastoid air cells are clear. Visualized orbits are unremarkable. Other: None. IMPRESSION: Negative head CT. Electronically Signed   By: Logan Bores M.D.   On: 05/25/2018 10:15   Dg Chest Port 1 View  Result Date: 05/25/2018 CLINICAL DATA:  Altered mental status.  Urinary tract infection EXAM: PORTABLE CHEST 1 VIEW COMPARISON:  None. FINDINGS: Normal mediastinum and cardiac silhouette. Normal pulmonary vasculature. No evidence of effusion, infiltrate, or pneumothorax. No acute bony abnormality. IMPRESSION: Normal chest radiograph. Electronically Signed   By: Suzy Bouchard M.D.   On: 05/25/2018 10:00    Pending Labs Unresulted Labs (From admission, onward)    Start     Ordered   05/26/18 0500  Comprehensive metabolic panel  Tomorrow morning,   R     05/25/18 1229   05/26/18 0500  CBC  Tomorrow morning,   R     05/25/18 1229    05/25/18 1224  TSH  ONCE - STAT,   R     05/25/18 1226   05/25/18 1223  Heparin level (unfractionated)  ONCE - STAT,   R     05/25/18 1222   05/25/18 1222  CK  Add-on,   R     05/25/18 1221   05/25/18 1027  Urine culture  ONCE - STAT,   STAT     05/25/18 1026   05/25/18 0940  HIV Antibody (routine testing w rflx)  Once,   R     05/25/18 0940   05/25/18 0914  Blood Culture (routine x 2)  BLOOD CULTURE X 2,   STAT     05/25/18 0915          Vitals/Pain Today's Vitals   05/25/18 1030 05/25/18 1045 05/25/18 1100 05/25/18 1115  BP: 120/75 111/77 110/69 116/79  Pulse:  (!) 121 (!) 121 (!) 122  Resp:  19  19  Temp:      TempSrc:      SpO2: 99% 98% 98% 98%  Weight:  Isolation Precautions No active isolations  Medications Medications  acetaminophen (TYLENOL) tablet 650 mg (has no administration in time range)    Or  acetaminophen (TYLENOL) suppository 650 mg (has no administration in time range)  ondansetron (ZOFRAN) tablet 4 mg (has no administration in time range)    Or  ondansetron (ZOFRAN) injection 4 mg (has no administration in time range)  polyethylene glycol (MIRALAX / GLYCOLAX) packet 17 g (has no administration in time range)  lactated ringers bolus 1,000 mL (0 mLs Intravenous Stopped 05/25/18 1124)  lactated ringers bolus 1,000 mL (0 mLs Intravenous Stopped 05/25/18 1124)  ceFEPIme (MAXIPIME) 2 g in sodium chloride 0.9 % 100 mL IVPB (2 g Intravenous New Bag/Given 05/25/18 1120)  iohexol (OMNIPAQUE) 350 MG/ML injection 100 mL (100 mLs Intravenous Contrast Given 05/25/18 1240)    Mobility non-ambulatory High fall risk   Focused Assessments Pulmonary Assessment Handoff:  Lung sounds:            R Recommendations: See Admitting Provider Note  Report given to:   Additional Notes: pt Covid (-); being trx w/IV abx for UTI

## 2018-05-25 NOTE — ED Notes (Addendum)
Pt's grandfather Marland Kitchen called and was given update on pt's status and plan of care. # [phone]

## 2018-05-25 NOTE — Progress Notes (Signed)
[facility] for heparin Indication: pulmonary embolus  No Known Allergies  Patient Measurements: Height: 5' (152.4 cm) Weight: 99 lb 3.3 oz (45 kg) IBW/kg (Calculated) : 45.5 Heparin Dosing Weight: 45kg  Vital Signs: Temp: 99.6 F (37.6 C) (04/18 1505) Temp Source: Axillary (04/18 1505) BP: 121/81 (04/18 1505) Pulse Rate: 101 (04/18 1505)  Labs: Recent Labs    05/25/18 0914 05/25/18 1400  HGB 12.4  --   HCT 38.9  --   PLT 305  --   HEPARINUNFRC  --  <0.10*  CREATININE 0.87  --   CKTOTAL  --  1,236*    Estimated Creatinine Clearance: 57.4 mL/min (by C-G formula based on SCr of 0.87 mg/dL).  Assessment: 51 yof presented to the ED with AMS. She is prescribed Xarelto for a recent PE but has likely either been non-compliant or provider stopped it.  She was found to have an acute PE.   Baseline heparin level is undetectable at <0.1 and would expect to be high if was taking Xarelto - no need to follow aPTT.  Heparin level is therapeutic at 0.61 on 800 units/hr. No bleeding noted.   Goal of Therapy:  Heparin level 0.3-0.7 units/ml Monitor platelets by anticoagulation protocol: Yes   Plan:  Continue heparin drip at 800 units/hr Confirmatory heparin level with am labs Daily heparin level, aPTT and CBC Monitor for s/sx of bleeding   Renold Genta, PharmD, BCPS Clinical Pharmacist Clinical phone for 05/25/2018 until 10p is [phone] 05/25/2018 8:58 PM  **Pharmacist phone directory can now be found on [email or web address] listed under [place]**

## 2018-05-26 ENCOUNTER — Inpatient Hospital Stay (HOSPITAL_COMMUNITY): Payer: Medicare Other

## 2018-05-26 DIAGNOSIS — G9340 Encephalopathy, unspecified: Secondary | ICD-10-CM

## 2018-05-26 DIAGNOSIS — R4182 Altered mental status, unspecified: Secondary | ICD-10-CM

## 2018-05-26 LAB — COMPREHENSIVE METABOLIC PANEL
ALT: 27 U/L (ref 0–44)
AST: 55 U/L — ABNORMAL HIGH (ref 15–41)
Albumin: 2.9 g/dL — ABNORMAL LOW (ref 3.5–5.0)
Alkaline Phosphatase: 46 U/L (ref 38–126)
Anion gap: 7 (ref 5–15)
BUN: 5 mg/dL — ABNORMAL LOW (ref 6–20)
CO2: 25 mmol/L (ref 22–32)
Calcium: 8.9 mg/dL (ref 8.9–10.3)
Chloride: 104 mmol/L (ref 98–111)
Creatinine, Ser: 0.66 mg/dL (ref 0.44–1.00)
GFR calc Af Amer: >60 mL/min (ref >60)
GFR calc non Af Amer: >60 mL/min (ref >60)
Glucose, Bld: 168 mg/dL — ABNORMAL HIGH (ref 70–99)
Potassium: 3.6 mmol/L (ref 3.5–5.1)
Sodium: 136 mmol/L (ref 135–145)
Total Bilirubin: 1.3 mg/dL — ABNORMAL HIGH (ref 0.3–1.2)
Total Protein: 5.9 g/dL — ABNORMAL LOW (ref 6.5–8.1)

## 2018-05-26 LAB — URINE CULTURE: Culture: NO GROWTH

## 2018-05-26 LAB — CBC
HCT: 31.9 % — ABNORMAL LOW (ref 36.0–46.0)
Hemoglobin: 10.6 g/dL — ABNORMAL LOW (ref 12.0–15.0)
MCH: 32.8 pg (ref 26.0–34.0)
MCHC: 33.2 g/dL (ref 30.0–36.0)
MCV: 98.8 fL (ref 80.0–100.0)
Platelets: 301 10*3/uL (ref 150–400)
RBC: 3.23 MIL/uL — ABNORMAL LOW (ref 3.87–5.11)
RDW: 12.8 % (ref 11.5–15.5)
WBC: 7.7 10*3/uL (ref 4.0–10.5)
nRBC: 0 % (ref 0.0–0.2)

## 2018-05-26 LAB — T4, FREE: Free T4: 1.28 ng/dL (ref 0.82–1.77)

## 2018-05-26 LAB — HEPARIN LEVEL (UNFRACTIONATED): Heparin Unfractionated: 0.64 IU/mL (ref 0.30–0.70)

## 2018-05-26 LAB — PROTIME-INR
INR: 1.3 — ABNORMAL HIGH (ref 0.8–1.2)
Prothrombin Time: 16.4 seconds — ABNORMAL HIGH (ref 11.4–15.2)

## 2018-05-26 LAB — HIV ANTIBODY (ROUTINE TESTING W REFLEX): HIV Screen 4th Generation wRfx: NONREACTIVE

## 2018-05-26 LAB — GLUCOSE, CAPILLARY
Glucose-Capillary: 120 mg/dL — ABNORMAL HIGH (ref 70–99)
Glucose-Capillary: 140 mg/dL — ABNORMAL HIGH (ref 70–99)
Glucose-Capillary: 137 mg/dL — ABNORMAL HIGH (ref 70–99)
Glucose-Capillary: 165 mg/dL — ABNORMAL HIGH (ref 70–99)

## 2018-05-26 LAB — AMMONIA: Ammonia: 36 umol/L — ABNORMAL HIGH (ref 9–35)

## 2018-05-26 LAB — TSH: TSH: 0.215 u[IU]/mL — ABNORMAL LOW (ref 0.350–4.500)

## 2018-05-26 LAB — HEMOGLOBIN A1C
Hgb A1c MFr Bld: 8.3 % — ABNORMAL HIGH (ref 4.8–5.6)
Mean Plasma Glucose: 191.51 mg/dL

## 2018-05-26 MED ORDER — FUROSEMIDE 40 MG PO TABS
40.0000 mg | ORAL_TABLET | Freq: Once | ORAL | Status: AC
  Administered 2018-05-26: 40 mg via ORAL
  Filled 2018-05-26: qty 1

## 2018-05-26 MED ORDER — SODIUM CHLORIDE 0.9 % IV SOLN
1.0000 g | INTRAVENOUS | Status: AC
  Administered 2018-05-26 – 2018-05-28 (×3): 1 g via INTRAVENOUS
  Filled 2018-05-26 (×3): qty 10

## 2018-05-26 MED ORDER — METOPROLOL TARTRATE 25 MG PO TABS
25.0000 mg | ORAL_TABLET | Freq: Two times a day (BID) | ORAL | Status: DC
  Administered 2018-05-26 – 2018-05-31 (×6): 25 mg via ORAL
  Filled 2018-05-26 (×8): qty 1

## 2018-05-26 MED ORDER — PRO-STAT SUGAR FREE PO LIQD
30.0000 mL | Freq: Three times a day (TID) | ORAL | Status: DC
  Administered 2018-05-26 – 2018-05-31 (×11): 30 mL via ORAL
  Filled 2018-05-26 (×9): qty 30

## 2018-05-26 MED ORDER — INSULIN ASPART 100 UNIT/ML ~~LOC~~ SOLN
0.0000 [IU] | SUBCUTANEOUS | Status: DC
  Administered 2018-05-26: 3 [IU] via SUBCUTANEOUS
  Administered 2018-05-26 (×2): 2 [IU] via SUBCUTANEOUS
  Administered 2018-05-27 (×2): 3 [IU] via SUBCUTANEOUS
  Administered 2018-05-27: 8 [IU] via SUBCUTANEOUS
  Administered 2018-05-27 (×2): 3 [IU] via SUBCUTANEOUS
  Administered 2018-05-28 (×2): 5 [IU] via SUBCUTANEOUS
  Administered 2018-05-28 (×2): 2 [IU] via SUBCUTANEOUS
  Administered 2018-05-28: 5 [IU] via SUBCUTANEOUS
  Administered 2018-05-28: 2 [IU] via SUBCUTANEOUS
  Administered 2018-05-29 (×2): 5 [IU] via SUBCUTANEOUS
  Administered 2018-05-29: 3 [IU] via SUBCUTANEOUS
  Administered 2018-05-29: 11 [IU] via SUBCUTANEOUS
  Administered 2018-05-30: 5 [IU] via SUBCUTANEOUS
  Administered 2018-05-30: 8 [IU] via SUBCUTANEOUS
  Administered 2018-05-30 (×2): 3 [IU] via SUBCUTANEOUS

## 2018-05-26 MED ORDER — CLONAZEPAM 1 MG PO TABS
1.0000 mg | ORAL_TABLET | Freq: Two times a day (BID) | ORAL | Status: DC
  Administered 2018-05-26 (×2): 1 mg via ORAL
  Filled 2018-05-26 (×2): qty 1

## 2018-05-26 MED ORDER — SODIUM CHLORIDE 0.9 % IV SOLN
INTRAVENOUS | Status: AC
  Administered 2018-05-26: 14:00:00 via INTRAVENOUS

## 2018-05-26 NOTE — Plan of Care (Signed)
  Problem: Education: Goal: Knowledge of General Education information will improve Description: Including pain rating scale, medication(s)/side effects and non-pharmacologic comfort measures Outcome: Progressing   Problem: Health Behavior/Discharge Planning: Goal: Ability to manage health-related needs will improve Outcome: Progressing   Problem: Clinical Measurements: Goal: Ability to maintain clinical measurements within normal limits will improve Outcome: Progressing Goal: Will remain free from infection Outcome: Progressing Goal: Diagnostic test results will improve Outcome: Progressing   Problem: Activity: Goal: Risk for activity intolerance will decrease Outcome: Progressing   Problem: Nutrition: Goal: Adequate nutrition will be maintained Outcome: Progressing   Problem: Elimination: Goal: Will not experience complications related to bowel motility Outcome: Progressing Goal: Will not experience complications related to urinary retention Outcome: Progressing   Problem: Pain Managment: Goal: General experience of comfort will improve Outcome: Progressing   Problem: Safety: Goal: Ability to remain free from injury will improve Outcome: Progressing   Problem: Skin Integrity: Goal: Risk for impaired skin integrity will decrease Outcome: Progressing   

## 2018-05-26 NOTE — Consult Note (Signed)
Patient who was brought to the hospital due to altered mental status but still confused, disoriented and unable to cooperate with psychiatric evaluation.  Recommendation: -Agreed with holding all antipsychotics until patient's mental status improves. -Re-consult psychiatric service when patient is more alert, oriented and medically cleared.  Corena Pilgrim, MD Attending psychiatrist.

## 2018-05-26 NOTE — Progress Notes (Signed)
EEG completed, results pending. 

## 2018-05-26 NOTE — Progress Notes (Signed)
   05/26/18 1200  Clinical Encounter Type  Visited With Patient  Visit Type Initial;Spiritual support  Referral From Nurse  Consult/Referral To Chaplain  Spiritual Encounters  Spiritual Needs Emotional   Responded to spiritual care consult for prayer. Nurse outside door stated PT in not able to talk. Nurse stated that family member has concerns for PT's communication and that other testing are on there way. I offered spiritual care with ministry of presence and silent prayer.  Chaplain Fidel Levy [phone]

## 2018-05-26 NOTE — Procedures (Signed)
History: 48 year old female being evaluated for encephalopathy  Sedation: None  Technique: This is a 21 channel routine scalp EEG performed at the bedside with bipolar and monopolar montages arranged in accordance to the international 10/20 system of electrode placement. One channel was dedicated to EKG recording.    Background: The background consists of intermixed alpha and beta activities. There is a well defined posterior dominant rhythm of 9 hz that attenuates with eye opening. Sleep is recorded with normal appearing structures.   Photic stimulation: Physiologic driving is not performed  EEG Abnormalities: None  Clinical Interpretation: This normal EEG is recorded in the waking and sleep state. There was no seizure or seizure predisposition recorded on this study. Please note that lack of epileptiform activity on EEG does not preclude the possibility of epilepsy.   Roland Rack, MD [facility] [phone]  If 7pm- 7am, please page neurology on call as listed in [place].

## 2018-05-26 NOTE — Progress Notes (Signed)
Patient ID: Debbie Dorsey, female   DOB: 1970-06-16, 48 y.o.   MRN: [id number] Spoke with Legal Guardian Debbie Kitchen Dorsey) which is patient grandfather. He wanted update to see how his granddaughter was doing. Informed him that she is more alert today than yesterday but still not really speaking much. He states that this is not her baseline and that she usually talks fine. Let him know that she had an EEG done today but the results are not back yet. Mr. Debbie Dorsey would like to get an update from the MD tomorrow. Will continue to follow and will let LG know if any changes in patient condition.  Cathlean Marseilles Tamala Julian, MSN, RN, [employer]

## 2018-05-26 NOTE — Progress Notes (Signed)
PROGRESS NOTE                                                                                                                                                                                                             Patient Demographics:    Debbie Dorsey, is a 48 y.o. female, DOB - 02/28/1970, JQB:[id number]  Admit date - 05/25/2018   Admitting Physician Oda Kilts, MD  Outpatient Primary MD for the patient is Avva, [name], MD  LOS - 1  Chief Complaint  Patient presents with  . Fever  . Altered Mental Status       Brief Narrative is a 47 year old African-American female who lives with her father at home with history of schizophrenia, DM type II on insulin, history of PE on Xarelto, brought in for mental status change noticed for the last few days by dad.  For decreased mental status change along with low-grade fever.  He was admitted by the teaching service with a running diagnosis of catatonia lung with CT angiogram revealing a PE.  Other work-up is that patient might have undiagnosed neuroleptic malignant syndrome.   Subjective:    Debbie Dorsey today bed appears to be in no distress but overall confused unable to answer questions or follow commands.   Assessment  & Plan :     1.  Encephalopathy with low-grade fever, elevated CK levels.  Highly suspicious for neuroleptic malignant syndrome, patient on multiple antipsychotic medications, all held, hydrate, twice daily long-acting benzodiazepine, currently no muscle rigidity but will monitor closely.  CT head is unremarkable.  We will also check EEG to rule out underlying seizures.  Please to evaluate and advance diet as tolerated, psych will be consulted to readdress her home medications.  Per chart review patient's mental status although still not at baseline but improved from what she came with.  2.  Acute PE.  On Xarelto.  Question compliance, currently on heparin drip, pharmacy monitoring and  consulted.  3.  DM type II.  Check A1c every 4 hours sliding scale.  4.  Low TSH.  Could be sick euthyroid syndrome, will repeat TSH along with free T4 and T3.  5.  UTI.  Rocephin and follow urine cultures.    Family Communication  : None  Code Status :  Full  Disposition Plan  :  TBD  Consults  :  Psych  Procedures  :    CTA - +ve PE  CT Head - negative  EEG -  DVT Prophylaxis  :   Heparin gtt  Lab Results  Component Value Date   PLT 301 05/26/2018    Diet :  Diet Order            Diet NPO time specified Except for: Sips with Meds  Diet effective now               Inpatient Medications Scheduled Meds: . clonazePAM  1 mg Oral BID  . insulin aspart  0-15 Units Subcutaneous Q4H  . metoprolol tartrate  25 mg Oral BID   Continuous Infusions: . sodium chloride    . cefTRIAXone (ROCEPHIN)  IV    . heparin Stopped (05/25/18 2000)   PRN Meds:.acetaminophen **OR** acetaminophen, dextrose, ondansetron **OR** ondansetron (ZOFRAN) IV, polyethylene glycol  Antibiotics  :   Anti-infectives (From admission, onward)   Start     Dose/Rate Route Frequency Ordered Stop   05/26/18 1100  vancomycin (VANCOCIN) IVPB 750 mg/150 ml premix  Status:  Discontinued     750 mg 150 mL/hr over 60 Minutes Intravenous Every 24 hours 05/25/18 1024 05/25/18 1229   05/26/18 0830  cefTRIAXone (ROCEPHIN) 1 g in sodium chloride 0.9 % 100 mL IVPB     1 g 200 mL/hr over 30 Minutes Intravenous Every 24 hours 05/26/18 0748 05/29/18 0829   05/25/18 2300  ceFEPIme (MAXIPIME) 2 g in sodium chloride 0.9 % 100 mL IVPB  Status:  Discontinued     2 g 200 mL/hr over 30 Minutes Intravenous Every 12 hours 05/25/18 1022 05/25/18 1229   05/25/18 1015  ceFEPIme (MAXIPIME) 2 g in sodium chloride 0.9 % 100 mL IVPB     2 g 200 mL/hr over 30 Minutes Intravenous  Once 05/25/18 1010 05/25/18 1631   05/25/18 1015  metroNIDAZOLE (FLAGYL) IVPB 500 mg  Status:  Discontinued     500 mg 100 mL/hr over 60  Minutes Intravenous  Once 05/25/18 1010 05/25/18 1229   05/25/18 1015  vancomycin (VANCOCIN) IVPB 1000 mg/200 mL premix  Status:  Discontinued     1,000 mg 200 mL/hr over 60 Minutes Intravenous  Once 05/25/18 1010 05/25/18 1235          Objective:   Vitals:   05/25/18 1505 05/25/18 2227 05/26/18 0514 05/26/18 0556  BP: 121/81 124/78  123/87  Pulse: (!) 101 (!) 116  (!) 124  Resp: 16 17  14   Temp: 99.6 F (37.6 C) 99.7 F (37.6 C)  98.9 F (37.2 C)  TempSrc: Axillary Oral    SpO2: 98% 98%  98%  Weight:   46.2 kg   Height: 5' (1.524 m)       Wt Readings from Last 3 Encounters:  05/26/18 46.2 kg  04/20/18 45.4 kg  11/14/16 47.6 kg     Intake/Output Summary (Last 24 hours) at 05/26/2018 0916 Last data filed at 05/26/2018 0402 Gross per 24 hour  Intake 1273.12 ml  Output 1500 ml  Net -226.88 ml     Physical Exam  Awake but confused, No new F.N deficits,   [place].AT,PERRAL Supple Neck,No JVD, No cervical lymphadenopathy appriciated.  Symmetrical Chest wall movement, Good air movement bilaterally, CTAB RRR,No Gallops,Rubs or new Murmurs, No Parasternal Heave +ve B.Sounds, Abd Soft, No tenderness, No organomegaly appriciated, No rebound - guarding or rigidity. No Cyanosis, Clubbing or edema, No new Rash or bruise       Data Review:    CBC Recent Labs  Lab 05/25/18 0914 05/26/18 0335  WBC 11.2* 7.7  HGB 12.4 10.6*  HCT 38.9 31.9*  PLT 305 301  MCV 100.5* 98.8  MCH 32.0 32.8  MCHC 31.9 33.2  RDW 13.1 12.8  LYMPHSABS 0.8  --   MONOABS 0.4  --   EOSABS 0.0  --   BASOSABS 0.0  --     Chemistries  Recent Labs  Lab 05/25/18 0914 05/26/18 0335  NA 143 136  K 5.1 3.6  CL 105 104  CO2 22 25  GLUCOSE 74 168*  BUN 16 5*  CREATININE 0.87 0.66  CALCIUM 10.1 8.9  AST 104* 55*  ALT 35 27  ALKPHOS 53 46  BILITOT 1.3* 1.3*   ------------------------------------------------------------------------------------------------------------------ No results for  input(s): CHOL, HDL, LDLCALC, TRIG, CHOLHDL, LDLDIRECT in the last 72 hours.  Lab Results  Component Value Date   HGBA1C 8.9 (H) 04/19/2018   ------------------------------------------------------------------------------------------------------------------ Recent Labs    05/25/18 1400  TSH 0.158*   ------------------------------------------------------------------------------------------------------------------ No results for input(s): VITAMINB12, FOLATE, FERRITIN, TIBC, IRON, RETICCTPCT in the last 72 hours.  Coagulation profile No results for input(s): INR, PROTIME in the last 168 hours.  No results for input(s): DDIMER in the last 72 hours.  Cardiac Enzymes No results for input(s): CKMB, TROPONINI, MYOGLOBIN in the last 168 hours.  Invalid input(s): CK ------------------------------------------------------------------------------------------------------------------ No results found for: BNP  Micro Results Recent Results (from the past 240 hour(s))  SARS Coronavirus 2 [facility] order, Performed in [place] hospital lab)     Status: None   Collection Time: 05/25/18  9:26 AM  Result Value Ref Range Status   SARS Coronavirus 2 NEGATIVE NEGATIVE Final    Comment: (NOTE) If result is NEGATIVE SARS-CoV-2 target nucleic acids are NOT DETECTED. The SARS-CoV-2 RNA is generally detectable in upper and lower  respiratory specimens during the acute phase of infection. The lowest  concentration of SARS-CoV-2 viral copies this assay can detect is 250  copies / mL. A negative result does not preclude SARS-CoV-2 infection  and should not be used as the sole basis for treatment or other  patient management decisions.  A negative result may occur with  improper specimen collection / handling, submission of specimen other  than nasopharyngeal swab, presence of viral mutation(s) within the  areas targeted by this assay, and inadequate number of viral copies  (<250 copies / mL). A  negative result must be combined with clinical  observations, patient history, and epidemiological information. If result is POSITIVE SARS-CoV-2 target nucleic acids are DETECTED. The SARS-CoV-2 RNA is generally detectable in upper and lower  respiratory specimens dur ing the acute phase of infection.  Positive  results are indicative of active infection with SARS-CoV-2.  Clinical  correlation with patient history and other diagnostic information is  necessary to determine patient infection status.  Positive results do  not rule out bacterial infection or co-infection with other viruses. If result is PRESUMPTIVE POSTIVE SARS-CoV-2 nucleic acids MAY BE PRESENT.   A presumptive positive result was obtained on the submitted specimen  and confirmed on repeat testing.  While 2019 novel coronavirus  (SARS-CoV-2) nucleic acids may be present in the submitted sample  additional confirmatory testing may be necessary for epidemiological  and / or clinical management purposes  to differentiate between  SARS-CoV-2 and other Sarbecovirus currently known to infect humans.  If clinically indicated additional testing with an alternate test  methodology [phone]) is advised. The SARS-CoV-2 RNA is generally  detectable in upper and lower respiratory sp ecimens during the acute  phase of infection.  The expected result is Negative. Fact Sheet for Patients:  StrictlyIdeas.no Fact Sheet for Healthcare Providers: BankingDealers.co.za This test is not yet approved or cleared by the [place] FDA and has been authorized for detection and/or diagnosis of SARS-CoV-2 by FDA under an Emergency Use Authorization (EUA).  This EUA will remain in effect (meaning this test can be used) for the duration of the COVID-19 declaration under Section 564(b)(1) of the Act, 21 U.S.C. section 360bbb-3(b)(1), unless the authorization is terminated or revoked sooner. Performed  at [facility], [place] [address] [place], [place] [address]     Radiology Reports Ct Head Wo Contrast  Result Date: 05/25/2018 CLINICAL DATA:  Altered mental status. Currently being treated for urinary tract infection. History of schizophrenia. EXAM: CT HEAD WITHOUT CONTRAST TECHNIQUE: Contiguous axial images were obtained from the base of the skull through the vertex without intravenous contrast. COMPARISON:  None. FINDINGS: Brain: There is no evidence of acute infarct, intracranial hemorrhage, mass, midline shift, or extra-axial fluid collection. The ventricles and sulci are normal. Vascular: No hyperdense vessel. Skull: No fracture or focal osseous lesion. Sinuses/Orbits: Visualized paranasal sinuses and mastoid air cells are clear. Visualized orbits are unremarkable. Other: None. IMPRESSION: Negative head CT. Electronically Signed   By: Logan Bores M.D.   On: 05/25/2018 10:15   Ct Angio Chest Pe W Or Wo Contrast  Result Date: 05/25/2018 CLINICAL DATA:  Diabetes.  Shortness of breath. EXAM: CT ANGIOGRAPHY CHEST WITH CONTRAST TECHNIQUE: Multidetector CT imaging of the chest was performed using the standard protocol during bolus administration of intravenous contrast. Multiplanar CT image reconstructions and MIPs were obtained to evaluate the vascular anatomy. CONTRAST:  18mL OMNIPAQUE IOHEXOL 350 MG/ML SOLN COMPARISON:  04/06/2018 FINDINGS: Cardiovascular: Satisfactory opacification of the pulmonary arteries to the segmental level. Right lower lobe segmental and subsegmental pulmonary emboli. No right heart strain. Normal heart size. No pericardial effusion. Mediastinum/Nodes: No enlarged mediastinal, hilar, or axillary lymph nodes. Thyroid gland, trachea, and esophagus demonstrate no significant findings. Lungs/Pleura: Lungs are clear. No pleural effusion or pneumothorax. Upper Abdomen: No acute abnormality. Musculoskeletal: No chest wall abnormality. No acute or significant osseous findings.  Review of the MIP images confirms the above findings. IMPRESSION: 1. Acute right lower lobe segmental and subsegmental pulmonary emboli. Critical Value/emergent results were called by telephone at the time of interpretation on 05/25/2018 at 1:09 pm to Dr. Davonna Belling , who verbally acknowledged these results. Electronically Signed   By: Kathreen Devoid   On: 05/25/2018 13:10   Dg Chest Port 1 View  Result Date: 05/25/2018 CLINICAL DATA:  Altered mental status.  Urinary tract infection EXAM: PORTABLE CHEST 1 VIEW COMPARISON:  None. FINDINGS: Normal mediastinum and cardiac silhouette. Normal pulmonary vasculature. No evidence of effusion, infiltrate, or pneumothorax. No acute bony abnormality. IMPRESSION: Normal chest radiograph. Electronically Signed   By: Suzy Bouchard M.D.   On: 05/25/2018 10:00    Time Spent in minutes  30   Lala Lund M.D on 05/26/2018 at 9:16 AM  To page go to [email or web address] - password [facility]

## 2018-05-26 NOTE — Progress Notes (Signed)
ANTICOAGULATION CONSULT NOTE  Pharmacy Consult for heparin Indication: pulmonary embolus  No Known Allergies  Patient Measurements: Height: 5' (152.4 cm) Weight: 101 lb 13.6 oz (46.2 kg) IBW/kg (Calculated) : 45.5 Heparin Dosing Weight: 45kg  Vital Signs: Temp: 98.9 F (37.2 C) (04/19 0556) Temp Source: Oral (04/18 2227) BP: 123/87 (04/19 0556) Pulse Rate: 124 (04/19 0556)  Labs: Recent Labs    05/25/18 0914 05/25/18 1400 05/25/18 2001 05/26/18 0335  HGB 12.4  --   --  10.6*  HCT 38.9  --   --  31.9*  PLT 305  --   --  301  APTT  --   --  67*  --   HEPARINUNFRC  --  <0.10* 0.61 0.64  CREATININE 0.87  --   --  0.66  CKTOTAL  --  1,236* 996*  --     Estimated Creatinine Clearance: 63.1 mL/min (by C-G formula based on SCr of 0.66 mg/dL).  Assessment: 60 yof presented to the ED with AMS. She is prescribed Xarelto for a recent PE but has likely either been non-compliant or provider stopped it.  She was found to have an acute PE.   Baseline heparin level is undetectable at <0.1 and would expect to be high if was taking Xarelto - no need to follow aPTT.  Heparin level is therapeutic at 0.64 this AM on 800 units/hr. No bleeding noted.   Goal of Therapy:  Heparin level 0.3-0.7 units/ml Monitor platelets by anticoagulation protocol: Yes   Plan:  Continue heparin drip at 800 units/hr Daily heparin level and CBC Monitor for s/sx of bleeding   Kahlea Cobert A. Levada Dy, PharmD, [place] Please utilize Amion for appropriate phone number to reach the unit pharmacist ([place])

## 2018-05-27 ENCOUNTER — Inpatient Hospital Stay (HOSPITAL_COMMUNITY): Payer: Medicare Other

## 2018-05-27 DIAGNOSIS — R4182 Altered mental status, unspecified: Secondary | ICD-10-CM

## 2018-05-27 LAB — COMPREHENSIVE METABOLIC PANEL
ALT: 26 U/L (ref 0–44)
AST: 36 U/L (ref 15–41)
Albumin: 2.9 g/dL — ABNORMAL LOW (ref 3.5–5.0)
Alkaline Phosphatase: 46 U/L (ref 38–126)
Anion gap: 6 (ref 5–15)
BUN: 14 mg/dL (ref 6–20)
CO2: 27 mmol/L (ref 22–32)
Calcium: 9 mg/dL (ref 8.9–10.3)
Chloride: 103 mmol/L (ref 98–111)
Creatinine, Ser: 0.69 mg/dL (ref 0.44–1.00)
GFR calc Af Amer: >60 mL/min (ref >60)
GFR calc non Af Amer: >60 mL/min (ref >60)
Glucose, Bld: 164 mg/dL — ABNORMAL HIGH (ref 70–99)
Potassium: 4.1 mmol/L (ref 3.5–5.1)
Sodium: 136 mmol/L (ref 135–145)
Total Bilirubin: 0.9 mg/dL (ref 0.3–1.2)
Total Protein: 6.2 g/dL — ABNORMAL LOW (ref 6.5–8.1)

## 2018-05-27 LAB — CBC
HCT: 34.7 % — ABNORMAL LOW (ref 36.0–46.0)
Hemoglobin: 11 g/dL — ABNORMAL LOW (ref 12.0–15.0)
MCH: 30.6 pg (ref 26.0–34.0)
MCHC: 31.7 g/dL (ref 30.0–36.0)
MCV: 96.7 fL (ref 80.0–100.0)
Platelets: 296 10*3/uL (ref 150–400)
RBC: 3.59 MIL/uL — ABNORMAL LOW (ref 3.87–5.11)
RDW: 12.3 % (ref 11.5–15.5)
WBC: 6 10*3/uL (ref 4.0–10.5)
nRBC: 0 % (ref 0.0–0.2)

## 2018-05-27 LAB — T3
T3, Total: 73 ng/dL (ref 71–180)
T3, Total: 78 ng/dL (ref 71–180)

## 2018-05-27 LAB — GLUCOSE, CAPILLARY
Glucose-Capillary: 153 mg/dL — ABNORMAL HIGH (ref 70–99)
Glucose-Capillary: 153 mg/dL — ABNORMAL HIGH (ref 70–99)
Glucose-Capillary: 157 mg/dL — ABNORMAL HIGH (ref 70–99)
Glucose-Capillary: 187 mg/dL — ABNORMAL HIGH (ref 70–99)
Glucose-Capillary: 201 mg/dL — ABNORMAL HIGH (ref 70–99)
Glucose-Capillary: 252 mg/dL — ABNORMAL HIGH (ref 70–99)
Glucose-Capillary: 75 mg/dL (ref 70–99)

## 2018-05-27 LAB — URINE CULTURE: Culture: NO GROWTH

## 2018-05-27 LAB — HEPARIN LEVEL (UNFRACTIONATED): Heparin Unfractionated: 0.53 IU/mL (ref 0.30–0.70)

## 2018-05-27 LAB — CK: Total CK: 494 U/L — ABNORMAL HIGH (ref 38–234)

## 2018-05-27 LAB — MAGNESIUM: Magnesium: 2.1 mg/dL (ref 1.7–2.4)

## 2018-05-27 MED ORDER — GLUCERNA SHAKE PO LIQD
237.0000 mL | Freq: Three times a day (TID) | ORAL | Status: DC
  Administered 2018-05-27 – 2018-05-31 (×8): 237 mL via ORAL

## 2018-05-27 MED ORDER — CLONAZEPAM 0.5 MG PO TABS
0.5000 mg | ORAL_TABLET | Freq: Every day | ORAL | Status: DC
  Administered 2018-05-27 – 2018-05-29 (×2): 0.5 mg via ORAL
  Filled 2018-05-27 (×3): qty 1

## 2018-05-27 MED ORDER — SODIUM CHLORIDE 0.9 % IV SOLN
INTRAVENOUS | Status: AC
  Administered 2018-05-27 – 2018-05-28 (×2): via INTRAVENOUS

## 2018-05-27 NOTE — Progress Notes (Signed)
[place] for heparin Indication: pulmonary embolus  No Known Allergies  Patient Measurements: Height: 5' (152.4 cm) Weight: 101 lb 13.6 oz (46.2 kg) IBW/kg (Calculated) : 45.5 Heparin Dosing Weight: 45kg  Vital Signs: Temp: 99.2 F (37.3 C) (04/20 0618) Temp Source: Oral (04/19 2204) BP: 118/78 (04/20 0618) Pulse Rate: 88 (04/20 0618)  Labs: Recent Labs    05/25/18 0914  05/25/18 1400 05/25/18 2001 05/26/18 0335 05/26/18 1137 05/27/18 0334  HGB 12.4  --   --   --  10.6*  --  11.0*  HCT 38.9  --   --   --  31.9*  --  34.7*  PLT 305  --   --   --  301  --  296  APTT  --   --   --  67*  --   --   --   LABPROT  --   --   --   --   --  16.4*  --   INR  --   --   --   --   --  1.3*  --   HEPARINUNFRC  --    < > <0.10* 0.61 0.64  --  0.53  CREATININE 0.87  --   --   --  0.66  --  0.69  CKTOTAL  --   --  1,236* 996*  --   --  494*   < > = values in this interval not displayed.    Estimated Creatinine Clearance: 63.1 mL/min (by C-G formula based on SCr of 0.69 mg/dL).  Assessment: 21 yof presented to the ED with AMS. She is prescribed Xarelto for a recent PE but has likely either been non-compliant or provider stopped it.  She was found to have an acute PE.   Baseline heparin level is undetectable at <0.1 and would expect to be high if was taking Xarelto - no need to follow aPTT.  Heparin level is therapeutic    Goal of Therapy:  Heparin level 0.3-0.7 units/ml Monitor platelets by anticoagulation protocol: Yes   Plan:  Continue heparin drip at 800 units/hr Daily heparin level and CBC Monitor for s/sx of bleeding   Thank you Anette Guarneri, PharmD [phone]

## 2018-05-27 NOTE — Evaluation (Signed)
Physical Therapy Evaluation Patient Details Name: Debbie Dorsey MRN: [id number] DOB: 1971-01-20 Today's Date: 05/27/2018   History of Present Illness  Pt is a 48 y/o female admitted secondary to AMS. 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 MRI and EEG unremarkable. CT angio revealed PE and pt was started on heparin. PMH includes schizophrenia, DM, and PE.   Clinical Impression  Pt admitted secondary to problem above with deficits below. Pt very fixated on being hungry and responding "I'm hungry" to most questions regardless of the question. Pt was agreeable to changing soiled bed pad and required supervision to roll from side to side. Pt very resistive to further mobility. Unsure of pt's baseline given cognitive deficits. Per notes, pt lives with grandfather. Anticipate pt will progress well once cognition improves, however, will continue to follow and update recommendations based on pt progression.     Follow Up Recommendations Other (comment);Supervision/Assistance - 24 hour(TBD pending pt progression )    Equipment Recommendations  Other (comment)(TBD)    Recommendations for Other Services OT consult     Precautions / Restrictions Precautions Precautions: Fall Restrictions Weight Bearing Restrictions: No      Mobility  Bed Mobility Overal bed mobility: Needs Assistance Bed Mobility: Rolling Rolling: Supervision         General bed mobility comments: supervision to roll from side to side to change soiled bed pad. Pt not agreeable to performing further mobility, as she kept repeating "I'm hungry." Attempted to assist pt to sitting at EOB following clean up, however, pt very resistive.   Transfers                    Ambulation/Gait                Stairs            Wheelchair Mobility    Modified Rankin (Stroke Patients Only)       Balance                                             Pertinent  Vitals/Pain Pain Assessment: Faces Pain Score: 0-No pain Faces Pain Scale: No hurt    Home Living Family/patient expects to be discharged to:: Private residence Living Arrangements: Other relatives(grandfather) Available Help at Discharge: Family Type of Home: House Home Access: Stairs to enter         Additional Comments: Pt only answering a few questions as she was very fixated on being hungry. Repeated "I'm hungry" multiple times when asked questions.     Prior Function           Comments: Unsure of PLOF as pt replying "I'm hungry" to most questions asked.      Hand Dominance        Extremity/Trunk Assessment   Upper Extremity Assessment Upper Extremity Assessment: Defer to OT evaluation    Lower Extremity Assessment Lower Extremity Assessment: Difficult to assess due to impaired cognition       Communication   Communication: No difficulties  Cognition Arousal/Alertness: Awake/alert Behavior During Therapy: Flat affect;Restless Overall Cognitive Status: No family/caregiver present to determine baseline cognitive functioning                                 General Comments: Pt with psych history. Pt  only oriented to person. Pt very fixated on being hungry and responded "I'm hungry" to most questions when asked. Assured pt multiple times that I would speak with RN about getting snack, however, pt kept repeating "I'm hungry." Was agreeable to change soiled bed pad.       General Comments      Exercises     Assessment/Plan    PT Assessment Patient needs continued PT services  PT Problem List Decreased cognition;Decreased knowledge of use of DME;Decreased knowledge of precautions;Decreased safety awareness;Decreased balance;Decreased mobility;Decreased activity tolerance;Decreased strength       PT Treatment Interventions DME instruction;Gait training;Functional mobility training;Therapeutic activities;Balance training;Therapeutic  exercise;Stair training;Patient/family education;Cognitive remediation    PT Goals (Current goals can be found in the Care Plan section)  Acute Rehab PT Goals PT Goal Formulation: Patient unable to participate in goal setting Time For Goal Achievement: 06/10/18 Potential to Achieve Goals: Fair    Frequency Min 3X/week   Barriers to discharge        Co-evaluation               AM-PAC PT "6 Clicks" Mobility  Outcome Measure Help needed turning from your back to your side while in a flat bed without using bedrails?: None Help needed moving from lying on your back to sitting on the side of a flat bed without using bedrails?: A Little Help needed moving to and from a bed to a chair (including a wheelchair)?: A Lot Help needed standing up from a chair using your arms (e.g., wheelchair or bedside chair)?: A Lot Help needed to walk in hospital room?: A Lot Help needed climbing 3-5 steps with a railing? : A Lot 6 Click Score: 15    End of Session   Activity Tolerance: Patient tolerated treatment well Patient left: in bed;with call bell/phone within reach;with bed alarm set Nurse Communication: Mobility status;Other (comment)(pt hungry) PT Visit Diagnosis: Other abnormalities of gait and mobility (R26.89);Difficulty in walking, not elsewhere classified (R26.2);Other symptoms and signs involving the nervous system (R51.884)    Time: 1520-1535 PT Time Calculation (min) (ACUTE ONLY): 15 min   Charges:   PT Evaluation $PT Eval Moderate Complexity: [place], PT, DPT  Acute Rehabilitation Services  Pager: [phone] Office: [phone]   Rudean Hitt 05/27/2018, 5:03 PM

## 2018-05-27 NOTE — Evaluation (Signed)
Clinical/Bedside Swallow Evaluation Patient Details  Name: Debbie Dorsey MRN: [id number] Date of Birth: October 30, 1970  Today's Date: 05/27/2018 Time: SLP Start Time (ACUTE ONLY): 0915 SLP Stop Time (ACUTE ONLY): 0945 SLP Time Calculation (min) (ACUTE ONLY): 30 min  Past Medical History:  Past Medical History:  Diagnosis Date  . Diabetes mellitus without complication [facility])    Past Surgical History: History reviewed. No pertinent surgical history. HPI:  Speech is difficult to understand, dysarthric but this seems neurological/cognitive rather than indicative of acute injury   Assessment / Plan / Recommendation Clinical Impression  Patient presents with an oropharyngeal swallow that is WNL and without any overt s/s of aspiration or penetration with puree solids, thin liquids via straw sips, or mixed solids (cold cereal, Rice Krispies with milk), graham crackers. Patient was a little impulsive with eating and seemed that she would continue eating if SLP had given her more food. No f/u from SLP for swallowing secondary to WNL.  SLP Visit Diagnosis: Dysphagia, unspecified (R13.10)    Aspiration Risk  No limitations    Diet Recommendation Regular;Thin liquid   Liquid Administration via: Cup;Straw Medication Administration: Whole meds with liquid Supervision: Patient able to self feed;Intermittent supervision to cue for compensatory strategies Compensations: Minimize environmental distractions;Slow rate Postural Changes: Seated upright at 90 degrees    Other  Recommendations Oral Care Recommendations: Oral care BID   Follow up Recommendations None      Frequency and Duration   N/A         Prognosis   N/A     Swallow Study   General Date of Onset: 05/27/18 HPI: Speech is difficult to understand, dysarthric but this seems neurological/cognitive rather than indicative of acute injury Type of Study: Bedside Swallow Evaluation Previous Swallow Assessment: N/A Diet Prior to this  Study: Regular;Thin liquids Temperature Spikes Noted: No Respiratory Status: Room air History of Recent Intubation: No Behavior/Cognition: Alert;Cooperative;Confused Oral Cavity Assessment: Within Functional Limits Oral Care Completed by SLP: No Oral Cavity - Dentition: Adequate natural dentition Vision: Functional for self-feeding Self-Feeding Abilities: Able to feed self Patient Positioning: Upright in bed Baseline Vocal Quality: Normal Volitional Cough: Cognitively unable to elicit Volitional Swallow: Unable to elicit    Oral/Motor/Sensory Function Overall Oral Motor/Sensory Function: Other (comment)(Speech is difficult to understand, dysarthric but this seems neurological/cognitive rather than indicative of acute injury)   Ice Chips     Thin Liquid Thin Liquid: Within functional limits Presentation: Straw;Self Fed Other Comments: No overt s/s of aspiration or penetration    Nectar Thick     Honey Thick     Puree Puree: Within functional limits Presentation: Spoon;Self Fed   Solid     Solid: Within functional limits      Dannial Monarch 05/27/2018,1:20 PM    Sonia Baller, MA, [place] [facility] Pager: [phone]

## 2018-05-27 NOTE — TOC Benefit Eligibility Note (Signed)
Transition of Care [facility]) Benefit Eligibility Note    Patient Details  Name: Debbie Dorsey MRN: [id number] Date of Birth: 1970-07-26   Medication/Dose: Alveda Reasons  15 MG BID(XARELTO 20 MG DAILY ALSO $ 3.90)  Covered?: Yes  Tier: 3 Drug  Prescription Coverage Preferred Pharmacy: YES([facility])  Spoke with Person/Company/Phone Number:: JOSH([facility] # [phone])  Co-Pay: $ 3.90(FOR EACH PRESCRIPTION)  Prior Approval: No  Deductible: Met(LOWE INCOME SUBSIDY)  Additional Notes: PATIENT HAS MEDICAID  [place] ACCESS(EFF-DATE : 11-06-2016  CO-PAY- $ 3.90  FOR EACH PRESCRIPTION )    Memory Argue Phone Number: 05/27/2018, 11:11 AM

## 2018-05-27 NOTE — Progress Notes (Addendum)
Initial Nutrition Assessment   RD working remotely  [place]:   Not applicable  INTERVENTION:    Glucerna Shake po TID, each supplement provides 220 kcal and 10 grams of protein  Prostat liquid protein po 30 ml BID with meals, each supplement provides 100 kcal, 15 grams protein  NUTRITION DIAGNOSIS:   Inadequate oral intake related to (AMS) as evidenced by meal completion < 50%  GOAL:   Patient will meet greater than or equal to 90% of their needs  MONITOR:   PO intake, Supplement acceptance, Labs, Skin, Weight trends, I & O's  REASON FOR ASSESSMENT:   Malnutrition Screening Tool  ASSESSMENT:   48 yo Female with PMH of schizophrenia, DM history of PE off of anticoagulation, presented for evaluation of altered mental status.   Admit dx include: Tachycardia [R00.0] Encephalopathy [G93.40]  RD unable to obtain nutrition hx from pt. She is confused and unable to answer questions. Pt arrived via [facility] from home.  PO intake has been poor at 30% per flowsheet records. SLP consulted for swallow evaluation >> pending. Labs & medications reviewed.  CBG's [id number].  Pt has Prostat liquid protein ordered po BID.  NUTRITION - FOCUSED PHYSICAL EXAM:  Unable to complete at this time  Diet Order:   Diet Order            Diet Carb Modified Fluid consistency: Thin; Room service appropriate? No  Diet effective now             EDUCATION NEEDS:   Not appropriate for education at this time  Skin:  Skin Assessment: Reviewed RN Assessment  Last BM:  unknown  Height:   Ht Readings from Last 1 Encounters:  05/25/18 5' (1.524 m)   Weight:   Wt Readings from Last 1 Encounters:  05/26/18 46.2 kg   BMI:  Body mass index is 19.89 kg/m.  Estimated Nutritional Needs:   Kcal:  1400-1600  Protein:  65-80 gm  Fluid:  >/= 1.5 L  Arthur Holms, RD, LDN Pager #: [phone] After-Hours Pager #: [phone]

## 2018-05-27 NOTE — Progress Notes (Signed)
PROGRESS NOTE                                                                                                                                                                                                             Patient Demographics:    Debbie Dorsey, is a 48 y.o. female, DOB - [birth year], GTX:[id number]  Admit date - 05/25/2018   Admitting Physician Oda Kilts, MD  Outpatient Primary MD for the patient is Avva, [name], MD  LOS - 2  Chief Complaint  Patient presents with  . Fever  . Altered Mental Status       Brief Narrative is a 48 year old African-American female who lives with her father at home with history of schizophrenia, DM type II on insulin, history of PE on Xarelto, brought in for mental status change noticed for the last few days by dad.  For decreased mental status change along with low-grade fever.  He was admitted by the teaching service with a running diagnosis of catatonia lung with CT angiogram revealing a PE.  Other work-up is that patient might have undiagnosed neuroleptic malignant syndrome.   Subjective:    Debbie Dorsey Alert today bed appears to be in no distress but overall confused unable to answer questions or follow commands.   Assessment  & Plan :     1.  Encephalopathy with low-grade fever, elevated CK levels.  Highly suspicious for neuroleptic malignant syndrome, patient on multiple antipsychotic medications, all held, continue to gently hydrate, tapering down long-acting benzodiazepine gradually, currently no muscle rigidity but will monitor closely.  CT head, MRI & EEG were unremarkable.  Continue Rocephin for UTI, cultures so far negative.  She continues to be somewhat confused likely encephalopathy due to neuroleptic malignant syndrome along with UTI.  We will also get neuro input.  Psych also requested to evaluate.  2.  Acute PE.  Was on Xarelto.  Question compliance, currently on heparin drip, pharmacy monitoring and  consulted.  3.  DM type II.  Check A1c every 4 hours sliding scale.   Lab Results  Component Value Date   HGBA1C 8.3 (H) 05/26/2018   CBG (last 3)  Recent Labs    05/27/18 0051 05/27/18 0325 05/27/18 0805  GLUCAP 157* 153* 75     4.  Low TSH.  likely sick euthyroid syndrome, stable free T4 and T3.  Repeat PCP to repeat TSH in 3 to 4 weeks.  5.  UTI.  Rocephin and follow urine cultures, so far  negative.     Family Communication  : updated grandfather Comptroller)  Code Status :  Full  Disposition Plan  :  TBD  Consults  :  Psych, Neuro  Procedures  :    CTA - +ve PE  CT Head - negative  EEG - Non acute  MRI - stable  DVT Prophylaxis  :   Heparin gtt  Lab Results  Component Value Date   PLT 296 05/27/2018    Diet :  Diet Order            Diet Carb Modified Fluid consistency: Thin; Room service appropriate? No  Diet effective now               Inpatient Medications Scheduled Meds: . clonazePAM  0.5 mg Oral QHS  . feeding supplement (PRO-STAT SUGAR FREE 64)  30 mL Oral TID WC  . insulin aspart  0-15 Units Subcutaneous Q4H  . metoprolol tartrate  25 mg Oral BID   Continuous Infusions: . sodium chloride    . cefTRIAXone (ROCEPHIN)  IV Stopped (05/26/18 2050)  . heparin 800 Units/hr (05/26/18 2049)   PRN Meds:.acetaminophen **OR** acetaminophen, dextrose, [DISCONTINUED] ondansetron **OR** ondansetron (ZOFRAN) IV, polyethylene glycol  Antibiotics  :   Anti-infectives (From admission, onward)   Start     Dose/Rate Route Frequency Ordered Stop   05/26/18 1100  vancomycin (VANCOCIN) IVPB 750 mg/150 ml premix  Status:  Discontinued     750 mg 150 mL/hr over 60 Minutes Intravenous Every 24 hours 05/25/18 1024 05/25/18 1229   05/26/18 0830  cefTRIAXone (ROCEPHIN) 1 g in sodium chloride 0.9 % 100 mL IVPB     1 g 200 mL/hr over 30 Minutes Intravenous Every 24 hours 05/26/18 0748 05/29/18 0829   05/25/18 2300  ceFEPIme (MAXIPIME) 2 g in sodium chloride 0.9 %  100 mL IVPB  Status:  Discontinued     2 g 200 mL/hr over 30 Minutes Intravenous Every 12 hours 05/25/18 1022 05/25/18 1229   05/25/18 1015  ceFEPIme (MAXIPIME) 2 g in sodium chloride 0.9 % 100 mL IVPB     2 g 200 mL/hr over 30 Minutes Intravenous  Once 05/25/18 1010 05/25/18 1631   05/25/18 1015  metroNIDAZOLE (FLAGYL) IVPB 500 mg  Status:  Discontinued     500 mg 100 mL/hr over 60 Minutes Intravenous  Once 05/25/18 1010 05/25/18 1229   05/25/18 1015  vancomycin (VANCOCIN) IVPB 1000 mg/200 mL premix  Status:  Discontinued     1,000 mg 200 mL/hr over 60 Minutes Intravenous  Once 05/25/18 1010 05/25/18 1235          Objective:   Vitals:   05/26/18 1327 05/26/18 2204 05/27/18 0618 05/27/18 0940  BP: 119/62 (!) 141/70 118/78 101/69  Pulse: 96 (!) 106 88 82  Resp: 16 20 16 19   Temp: 97.7 F (36.5 C) 99.3 F (37.4 C) 99.2 F (37.3 C) 98.9 F (37.2 C)  TempSrc:  Oral  Oral  SpO2: 99% 97% 100% 98%  Weight:      Height:        Wt Readings from Last 3 Encounters:  05/26/18 46.2 kg  04/20/18 45.4 kg  11/14/16 47.6 kg     Intake/Output Summary (Last 24 hours) at 05/27/2018 0941 Last data filed at 05/27/2018 0400 Gross per 24 hour  Intake 1582.51 ml  Output 900 ml  Net 682.51 ml     Physical Exam  Awake but confused, No new F.N deficits,   [place].AT,PERRAL  Supple Neck,No JVD, No cervical lymphadenopathy appriciated.  Symmetrical Chest wall movement, Good air movement bilaterally, CTAB RRR,No Gallops,Rubs or new Murmurs, No Parasternal Heave +ve B.Sounds, Abd Soft, No tenderness, No organomegaly appriciated, No rebound - guarding or rigidity. No Cyanosis, Clubbing or edema, No new Rash or bruise       Data Review:    CBC Recent Labs  Lab 05/25/18 0914 05/26/18 0335 05/27/18 0334  WBC 11.2* 7.7 6.0  HGB 12.4 10.6* 11.0*  HCT 38.9 31.9* 34.7*  PLT 305 301 296  MCV 100.5* 98.8 96.7  MCH 32.0 32.8 30.6  MCHC 31.9 33.2 31.7  RDW 13.1 12.8 12.3  LYMPHSABS 0.8  --    --   MONOABS 0.4  --   --   EOSABS 0.0  --   --   BASOSABS 0.0  --   --     Chemistries  Recent Labs  Lab 05/25/18 0914 05/26/18 0335 05/27/18 0334  NA 143 136 136  K 5.1 3.6 4.1  CL 105 104 103  CO2 22 25 27   GLUCOSE 74 168* 164*  BUN 16 5* 14  CREATININE 0.87 0.66 0.69  CALCIUM 10.1 8.9 9.0  MG  --   --  2.1  AST 104* 55* 36  ALT 35 27 26  ALKPHOS 53 46 46  BILITOT 1.3* 1.3* 0.9   ------------------------------------------------------------------------------------------------------------------ No results for input(s): CHOL, HDL, LDLCALC, TRIG, CHOLHDL, LDLDIRECT in the last 72 hours.  Lab Results  Component Value Date   HGBA1C 8.3 (H) 05/26/2018   ------------------------------------------------------------------------------------------------------------------ Recent Labs    05/26/18 1137  TSH 0.215*   ------------------------------------------------------------------------------------------------------------------ No results for input(s): VITAMINB12, FOLATE, FERRITIN, TIBC, IRON, RETICCTPCT in the last 72 hours.  Coagulation profile Recent Labs  Lab 05/26/18 1137  INR 1.3*    No results for input(s): DDIMER in the last 72 hours.  Cardiac Enzymes No results for input(s): CKMB, TROPONINI, MYOGLOBIN in the last 168 hours.  Invalid input(s): CK ------------------------------------------------------------------------------------------------------------------ No results found for: BNP  Micro Results Recent Results (from the past 240 hour(s))  Blood Culture (routine x 2)     Status: None (Preliminary result)   Collection Time: 05/25/18  9:10 AM  Result Value Ref Range Status   Specimen Description BLOOD LEFT ANTECUBITAL  Final   Special Requests   Final    BOTTLES DRAWN AEROBIC AND ANAEROBIC Blood Culture adequate volume   Culture   Final    NO GROWTH 1 DAY Performed at [facility], [address] [place], [place] [address]    Report Status  PENDING  Incomplete  Urine culture     Status: None   Collection Time: 05/25/18  9:17 AM  Result Value Ref Range Status   Specimen Description URINE, RANDOM  Final   Special Requests NONE  Final   Culture   Final    NO GROWTH Performed at [facility], [address], [place], [place] [address]    Report Status 05/26/2018 FINAL  Final  SARS Coronavirus 2 [facility] order, Performed in [place] hospital lab)     Status: None   Collection Time: 05/25/18  9:26 AM  Result Value Ref Range Status   SARS Coronavirus 2 NEGATIVE NEGATIVE Final    Comment: (NOTE) If result is NEGATIVE SARS-CoV-2 target nucleic acids are NOT DETECTED. The SARS-CoV-2 RNA is generally detectable in upper and lower  respiratory specimens during the acute phase of infection. The lowest  concentration of SARS-CoV-2 viral copies  this assay can detect is 250  copies / mL. A negative result does not preclude SARS-CoV-2 infection  and should not be used as the sole basis for treatment or other  patient management decisions.  A negative result may occur with  improper specimen collection / handling, submission of specimen other  than nasopharyngeal swab, presence of viral mutation(s) within the  areas targeted by this assay, and inadequate number of viral copies  (<250 copies / mL). A negative result must be combined with clinical  observations, patient history, and epidemiological information. If result is POSITIVE SARS-CoV-2 target nucleic acids are DETECTED. The SARS-CoV-2 RNA is generally detectable in upper and lower  respiratory specimens dur ing the acute phase of infection.  Positive  results are indicative of active infection with SARS-CoV-2.  Clinical  correlation with patient history and other diagnostic information is  necessary to determine patient infection status.  Positive results do  not rule out bacterial infection or co-infection with other viruses. If result is PRESUMPTIVE POSTIVE  SARS-CoV-2 nucleic acids MAY BE PRESENT.   A presumptive positive result was obtained on the submitted specimen  and confirmed on repeat testing.  While 2019 novel coronavirus  (SARS-CoV-2) nucleic acids may be present in the submitted sample  additional confirmatory testing may be necessary for epidemiological  and / or clinical management purposes  to differentiate between  SARS-CoV-2 and other Sarbecovirus currently known to infect humans.  If clinically indicated additional testing with an alternate test  methodology [phone]) is advised. The SARS-CoV-2 RNA is generally  detectable in upper and lower respiratory sp ecimens during the acute  phase of infection. The expected result is Negative. Fact Sheet for Patients:  StrictlyIdeas.no Fact Sheet for Healthcare Providers: BankingDealers.co.za This test is not yet approved or cleared by the [place] FDA and has been authorized for detection and/or diagnosis of SARS-CoV-2 by FDA under an Emergency Use Authorization (EUA).  This EUA will remain in effect (meaning this test can be used) for the duration of the COVID-19 declaration under Section 564(b)(1) of the Act, 21 U.S.C. section 360bbb-3(b)(1), unless the authorization is terminated or revoked sooner. Performed at [facility], [place] [address] [place], [place] [address]   Blood Culture (routine x 2)     Status: None (Preliminary result)   Collection Time: 05/25/18  2:29 PM  Result Value Ref Range Status   Specimen Description BLOOD SITE NOT SPECIFIED  Final   Special Requests   Final    BOTTLES DRAWN AEROBIC AND ANAEROBIC Blood Culture results may not be optimal due to an inadequate volume of blood received in culture bottles   Culture   Final    NO GROWTH 1 DAY Performed at [facility], [place] [address], [place], [place] [address]    Report Status PENDING  Incomplete  Culture, Urine     Status: None   Collection  Time: 05/26/18 12:05 PM  Result Value Ref Range Status   Specimen Description URINE, CLEAN CATCH  Final   Special Requests NONE  Final   Culture   Final    NO GROWTH Performed at [facility], [place] [address] [place], [place] [address]    Report Status 05/27/2018 FINAL  Final    Radiology Reports Ct Head Wo Contrast  Result Date: 05/25/2018 CLINICAL DATA:  Altered mental status. Currently being treated for urinary tract infection. History of schizophrenia. EXAM: CT HEAD WITHOUT CONTRAST TECHNIQUE: Contiguous axial images were obtained from  the base of the skull through the vertex without intravenous contrast. COMPARISON:  None. FINDINGS: Brain: There is no evidence of acute infarct, intracranial hemorrhage, mass, midline shift, or extra-axial fluid collection. The ventricles and sulci are normal. Vascular: No hyperdense vessel. Skull: No fracture or focal osseous lesion. Sinuses/Orbits: Visualized paranasal sinuses and mastoid air cells are clear. Visualized orbits are unremarkable. Other: None. IMPRESSION: Negative head CT. Electronically Signed   By: Logan Bores M.D.   On: 05/25/2018 10:15   Ct Angio Chest Pe W Or Wo Contrast  Result Date: 05/25/2018 CLINICAL DATA:  Diabetes.  Shortness of breath. EXAM: CT ANGIOGRAPHY CHEST WITH CONTRAST TECHNIQUE: Multidetector CT imaging of the chest was performed using the standard protocol during bolus administration of intravenous contrast. Multiplanar CT image reconstructions and MIPs were obtained to evaluate the vascular anatomy. CONTRAST:  150mL OMNIPAQUE IOHEXOL 350 MG/ML SOLN COMPARISON:  04/06/2018 FINDINGS: Cardiovascular: Satisfactory opacification of the pulmonary arteries to the segmental level. Right lower lobe segmental and subsegmental pulmonary emboli. No right heart strain. Normal heart size. No pericardial effusion. Mediastinum/Nodes: No enlarged mediastinal, hilar, or axillary lymph nodes. Thyroid gland, trachea, and esophagus  demonstrate no significant findings. Lungs/Pleura: Lungs are clear. No pleural effusion or pneumothorax. Upper Abdomen: No acute abnormality. Musculoskeletal: No chest wall abnormality. No acute or significant osseous findings. Review of the MIP images confirms the above findings. IMPRESSION: 1. Acute right lower lobe segmental and subsegmental pulmonary emboli. Critical Value/emergent results were called by telephone at the time of interpretation on 05/25/2018 at 1:09 pm to Dr. Davonna Belling , who verbally acknowledged these results. Electronically Signed   By: Kathreen Devoid   On: 05/25/2018 13:10   Mr Brain Wo Contrast  Result Date: 05/27/2018 CLINICAL DATA:  48 year old female with altered mental status and low-grade fever. Right lower lobe subsegmental pulmonary embolus. EXAM: MRI HEAD WITHOUT CONTRAST TECHNIQUE: Multiplanar, multiecho pulse sequences of the brain and surrounding structures were obtained without intravenous contrast. COMPARISON:  Head CT 05/25/2018. FINDINGS: Brain: No restricted diffusion to suggest acute infarction. No midline shift, mass effect, evidence of mass lesion, ventriculomegaly, extra-axial collection or acute intracranial hemorrhage. Cervicomedullary junction and pituitary are within normal limits. Scattered mild to moderate for age nonspecific cerebral white matter T2 and FLAIR hyperintense foci, mostly subcortical. No cortical encephalomalacia. No chronic cerebral blood products. Deep gray matter nuclei, brainstem and cerebellum are within normal limits. Vascular: Major intracranial vascular flow voids are preserved. Skull and upper cervical spine: Negative visible cervical spine. Visualized bone marrow signal is within normal limits. Sinuses/Orbits: Negative. Other: Mastoids are clear. Scalp and face soft tissues appear negative. IMPRESSION: No acute intracranial abnormality. Mild to moderate for age scattered nonspecific cerebral white matter signal changes, most commonly  due to small vessel disease. Electronically Signed   By: Genevie Ann M.D.   On: 05/27/2018 09:32   Dg Chest Port 1 View  Result Date: 05/25/2018 CLINICAL DATA:  Altered mental status.  Urinary tract infection EXAM: PORTABLE CHEST 1 VIEW COMPARISON:  None. FINDINGS: Normal mediastinum and cardiac silhouette. Normal pulmonary vasculature. No evidence of effusion, infiltrate, or pneumothorax. No acute bony abnormality. IMPRESSION: Normal chest radiograph. Electronically Signed   By: Suzy Bouchard M.D.   On: 05/25/2018 10:00    Time Spent in minutes  30   Lala Lund M.D on 05/27/2018 at 9:41 AM  To page go to [email or web address] - password [email or web address]
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
## 2018-05-27 NOTE — Consult Note (Addendum)
Neurology Consultation  Reason for Consult: AMS. Request by family for neurological consulation Referring Physician: Dr. Candiss Norse  CC: altered mental status  History is obtained from:chart  HPI: Debbie Dorsey is a 48 y.o. female PMH of schizophrenia, DM history of PE off of anticoagulation, presented for evaluation of altered mental status.  She lives with her grandfather who is the guardian and caretaker.  The grandfather reported that she was acting like a zombie and not acting like herself over the past few days.  There is some question of incorrect medication intake on the H&P. Upon medicine team evaluation, she had altered mental status, low-grade fever, with multiple medications on board prompting a diagnosis of neuroleptic malignant syndrome. Her neuroleptics were discontinued.  Her lab derangements were corrected.  An MRI of the brain was obtained as well as an EEG both of which were unremarkable for any acute process. Patient is continuing to improve in terms of her mentation.  Neurological consultation was obtained upon family's request.  ROS: Unable to reliably obtain due to patient's mental status  Past Medical History:  Diagnosis Date  . Diabetes mellitus without complication ([place])      Family History  Problem Relation Age of Onset  . Mental illness Sister    Social History:   reports that she has never smoked. She has never used smokeless tobacco. She reports that she does not drink alcohol or use drugs.  Medications  Current Facility-Administered Medications:  .  0.9 %  sodium chloride infusion, , Intravenous, Continuous, Candiss Norse, Margaree Mackintosh, MD .  acetaminophen (TYLENOL) tablet 650 mg, 650 mg, Oral, Q6H PRN **OR** acetaminophen (TYLENOL) suppository 650 mg, 650 mg, Rectal, Q6H PRN, Santos-Sanchez, Idalys, MD .  cefTRIAXone (ROCEPHIN) 1 g in sodium chloride 0.9 % 100 mL IVPB, 1 g, Intravenous, Q24H, Thurnell Lose, MD, Stopped at 05/26/18 2050 .  clonazePAM  (KLONOPIN) tablet 0.5 mg, 0.5 mg, Oral, QHS, Singh, Prashant K, MD .  dextrose 50 % solution 50 mL, 1 ampule, Intravenous, Q2H PRN, Santos-Sanchez, Idalys, MD .  feeding supplement (PRO-STAT SUGAR FREE 64) liquid 30 mL, 30 mL, Oral, TID WC, Thurnell Lose, MD, 30 mL at 05/26/18 1802 .  heparin ADULT infusion 100 units/mL (25000 units/28mL sodium chloride 0.45%), 800 Units/hr, Intravenous, Continuous, Rumbarger, Valeda Malm, RPH, Last Rate: 8 mL/hr at 05/26/18 2049, 800 Units/hr at 05/26/18 2049 .  insulin aspart (novoLOG) injection 0-15 Units, 0-15 Units, Subcutaneous, Q4H, Thurnell Lose, MD, 3 Units at 05/27/18 0351 .  metoprolol tartrate (LOPRESSOR) tablet 25 mg, 25 mg, Oral, BID, Thurnell Lose, MD, 25 mg at 05/26/18 2209 .  [DISCONTINUED] ondansetron (ZOFRAN) tablet 4 mg, 4 mg, Oral, Q6H PRN **OR** ondansetron (ZOFRAN) injection 4 mg, 4 mg, Intravenous, Q6H PRN, Santos-Sanchez, Idalys, MD .  polyethylene glycol (MIRALAX / GLYCOLAX) packet 17 g, 17 g, Oral, Daily PRN, Welford Roche, MD  Exam: Current vital signs: BP 101/69 (BP Location: Right Arm)   Pulse 82   Temp 98.9 F (37.2 C) (Oral)   Resp 19   Ht 5' (1.524 m)   Wt 46.2 kg   SpO2 98%   BMI 19.89 kg/m  Vital signs in last 24 hours: Temp:  [97.7 F (36.5 C)-99.3 F (37.4 C)] 98.9 F (37.2 C) (04/20 0940) Pulse Rate:  [82-106] 82 (04/20 0940) Resp:  [16-20] 19 (04/20 0940) BP: (101-141)/(62-78) 101/69 (04/20 0940) SpO2:  [97 %-100 %] 98 % (04/20 0940) General: She is awake alert eating cereal with the  speech therapist. HEENT: Normocephalic atraumatic Lungs: Clear to auscultation Cardiovascular: S1-2 heard regular rate rhythm Abdomen: Soft nondistended nontender Extremities: Warm well perfused with no edema Neurological exam Patient is awake, alert, follows commands. Her speech is severely dysarthric and has a stuttering quality to it as well. Naming is intact.  Comprehension is intact although slow.   Repetition is intact.  Marred by poor attention concentration. Cranial: Pupils equal round reactive light, she blinks to threat from both sides, no restriction in extraocular movements, symmetric face, intact auditory acuity. Motor exam: Antigravity in all 4 extremities with normal tone. Sensory exam: Intact light touch all over DTRs: 2+ all over with no clonus. Coordination: Intact finger-nose-finger.  Labs I have reviewed labs in epic and the results pertinent to this consultation are: CK 996 2 days ago down to 494 today.  CBC    Component Value Date/Time   WBC 6.0 05/27/2018 0334   RBC 3.59 (L) 05/27/2018 0334   HGB 11.0 (L) 05/27/2018 0334   HCT 34.7 (L) 05/27/2018 0334   PLT 296 05/27/2018 0334   MCV 96.7 05/27/2018 0334   MCH 30.6 05/27/2018 0334   MCHC 31.7 05/27/2018 0334   RDW 12.3 05/27/2018 0334   LYMPHSABS 0.8 05/25/2018 0914   MONOABS 0.4 05/25/2018 0914   EOSABS 0.0 05/25/2018 0914   BASOSABS 0.0 05/25/2018 0914    CMP     Component Value Date/Time   NA 136 05/27/2018 0334   K 4.1 05/27/2018 0334   CL 103 05/27/2018 0334   CO2 27 05/27/2018 0334   GLUCOSE 164 (H) 05/27/2018 0334   BUN 14 05/27/2018 0334   CREATININE 0.69 05/27/2018 0334   CALCIUM 9.0 05/27/2018 0334   PROT 6.2 (L) 05/27/2018 0334   ALBUMIN 2.9 (L) 05/27/2018 0334   AST 36 05/27/2018 0334   ALT 26 05/27/2018 0334   ALKPHOS 46 05/27/2018 0334   BILITOT 0.9 05/27/2018 0334   GFRNONAA >60 05/27/2018 0334   GFRAA >60 05/27/2018 0334   Imaging I have reviewed the images obtained: MRI examination of the brain-no acute changes.  Mild to moderate for age scattered nonspecific white matter changes.  EEG- no seizure, normal EEG.  Assessment:  48 year old woman with a history of schizophrenia presenting for evaluation of altered mental status. On arrival had low grade fever, confusion, laboratories was that included increased CK. Presumptive diagnosis of neuroleptic malignant syndrome was  made and patient was aggressively hydrated.  CKs improving now and so is her mental status. Her neuroleptics were also withheld at the time. I do suspect that her current clinical features which include some delirium are consistent with possible neuroleptic malignant syndrome or part of her schizophrenia. I did not see any evidence of a primary neurological disorder at this time with negative imaging and EEG along with a rather benign neurological exam-with the exception of drainage attention concentration.  I did not find any evidence of hyperreflexia or hypertonia at this time.  Impression: Toxic metabolic encephalopathy-likely related to the medications  Recommendations: -Continue supportive management per primary team -I would not recommend any further neurological testing -Continue to hold neuroleptics. -Reconsult psychiatry for their input on optimization of her medications whenever deemed appropriate by medicine. Neurological services will be available as needed.    -- Amie Portland, MD [facility] Pager: [phone] If 7pm to 7am, please call on call as listed on AMION.

## 2018-05-28 DIAGNOSIS — I2699 Other pulmonary embolism without acute cor pulmonale: Secondary | ICD-10-CM

## 2018-05-28 LAB — COMPREHENSIVE METABOLIC PANEL
ALT: 24 U/L (ref 0–44)
AST: 28 U/L (ref 15–41)
Albumin: 2.7 g/dL — ABNORMAL LOW (ref 3.5–5.0)
Alkaline Phosphatase: 41 U/L (ref 38–126)
Anion gap: 10 (ref 5–15)
BUN: 9 mg/dL (ref 6–20)
CO2: 22 mmol/L (ref 22–32)
Calcium: 9.2 mg/dL (ref 8.9–10.3)
Chloride: 107 mmol/L (ref 98–111)
Creatinine, Ser: 0.64 mg/dL (ref 0.44–1.00)
GFR calc Af Amer: >60 mL/min (ref >60)
GFR calc non Af Amer: >60 mL/min (ref >60)
Glucose, Bld: 169 mg/dL — ABNORMAL HIGH (ref 70–99)
Potassium: 3.7 mmol/L (ref 3.5–5.1)
Sodium: 139 mmol/L (ref 135–145)
Total Bilirubin: 0.4 mg/dL (ref 0.3–1.2)
Total Protein: 5.7 g/dL — ABNORMAL LOW (ref 6.5–8.1)

## 2018-05-28 LAB — CBC
HCT: 32.5 % — ABNORMAL LOW (ref 36.0–46.0)
Hemoglobin: 10.3 g/dL — ABNORMAL LOW (ref 12.0–15.0)
MCH: 30.7 pg (ref 26.0–34.0)
MCHC: 31.7 g/dL (ref 30.0–36.0)
MCV: 97 fL (ref 80.0–100.0)
Platelets: 318 10*3/uL (ref 150–400)
RBC: 3.35 MIL/uL — ABNORMAL LOW (ref 3.87–5.11)
RDW: 12.4 % (ref 11.5–15.5)
WBC: 5.2 10*3/uL (ref 4.0–10.5)
nRBC: 0 % (ref 0.0–0.2)

## 2018-05-28 LAB — GLUCOSE, CAPILLARY
Glucose-Capillary: 142 mg/dL — ABNORMAL HIGH (ref 70–99)
Glucose-Capillary: 174 mg/dL — ABNORMAL HIGH (ref 70–99)
Glucose-Capillary: 246 mg/dL — ABNORMAL HIGH (ref 70–99)
Glucose-Capillary: 219 mg/dL — ABNORMAL HIGH (ref 70–99)

## 2018-05-28 LAB — MAGNESIUM: Magnesium: 2.1 mg/dL (ref 1.7–2.4)

## 2018-05-28 LAB — HEPARIN LEVEL (UNFRACTIONATED): Heparin Unfractionated: 0.65 IU/mL (ref 0.30–0.70)

## 2018-05-28 MED ORDER — LORAZEPAM 2 MG/ML IJ SOLN
1.0000 mg | Freq: Once | INTRAMUSCULAR | Status: AC
  Administered 2018-05-28: 1 mg via INTRAVENOUS
  Filled 2018-05-28: qty 1

## 2018-05-28 NOTE — Progress Notes (Signed)
PROGRESS NOTE        PATIENT DETAILS Name: Debbie Dorsey Age: 48 y.o. Sex: female Date of Birth: 16-Sep-1970 Admit Date: 05/25/2018 Admitting Physician Oda Kilts, MD TDV:VOHY, [name], MD  Brief Narrative: Patient is a 48 y.o. female with prior history of schizophrenia, DM-2, pulmonary embolism-presented with altered mental status-thought to be secondary to toxic metabolic encephalopathy.  Subjective: Awake-tells me her name-but then will have a blank stare most of the time.  Assessment/Plan: Acute toxic metabolic encephalopathy: Slowly improving-awake-tells me in a.m.-ambulated with PT in the hallway-but when at rest-has a blank stare.  MRI brain without any acute changes, EEG negative.  Some concern for neuroleptic malignant syndrome-no rigidity-but CK elevated-all antipsychotics currently on hold.  Psychiatry and neurology following.  Pulmonary embolism: Unfortunately noncompliant with Della Goo recently had a pulmonary embolism in February.  IV heparin restarted-we will transition back to Xarelto in the next day or so.  DM-2: CBG stable-continue SSI  Mildly suppressed TSH with normal free T4: Could be mild subclinical hypothyroidism-repeat thyroid function in 3 months.  Asymptomatic bacteriuria: No longer on Rocephin-urine culture negative.  Doubt patient had UTI  History of paranoid schizophrenia: Due to concern for neuroleptic malignant syndrome-all antipsychotics on hold.  DVT Prophylaxis: IV heparin  Code Status: Full code   Family Communication: Grandfather over the phone  Disposition Plan: Remain inpatient  Antimicrobial agents: Anti-infectives (From admission, onward)   Start     Dose/Rate Route Frequency Ordered Stop   05/26/18 1100  vancomycin (VANCOCIN) IVPB 750 mg/150 ml premix  Status:  Discontinued     750 mg 150 mL/hr over 60 Minutes Intravenous Every 24 hours 05/25/18 1024 05/25/18 1229   05/26/18 0830   cefTRIAXone (ROCEPHIN) 1 g in sodium chloride 0.9 % 100 mL IVPB     1 g 200 mL/hr over 30 Minutes Intravenous Every 24 hours 05/26/18 0748 05/28/18 1028   05/25/18 2300  ceFEPIme (MAXIPIME) 2 g in sodium chloride 0.9 % 100 mL IVPB  Status:  Discontinued     2 g 200 mL/hr over 30 Minutes Intravenous Every 12 hours 05/25/18 1022 05/25/18 1229   05/25/18 1015  ceFEPIme (MAXIPIME) 2 g in sodium chloride 0.9 % 100 mL IVPB     2 g 200 mL/hr over 30 Minutes Intravenous  Once 05/25/18 1010 05/25/18 1631   05/25/18 1015  metroNIDAZOLE (FLAGYL) IVPB 500 mg  Status:  Discontinued     500 mg 100 mL/hr over 60 Minutes Intravenous  Once 05/25/18 1010 05/25/18 1229   05/25/18 1015  vancomycin (VANCOCIN) IVPB 1000 mg/200 mL premix  Status:  Discontinued     1,000 mg 200 mL/hr over 60 Minutes Intravenous  Once 05/25/18 1010 05/25/18 1235      Procedures: None  CONSULTS:  neurology and psychiatry  Time spent: 25 minutes-Greater than 50% of this time was spent in counseling, explanation of diagnosis, planning of further management, and coordination of care.  MEDICATIONS: Scheduled Meds:  clonazePAM  0.5 mg Oral QHS   feeding supplement (GLUCERNA SHAKE)  237 mL Oral TID BM   feeding supplement (PRO-STAT SUGAR FREE 64)  30 mL Oral TID WC   insulin aspart  0-15 Units Subcutaneous Q4H   metoprolol tartrate  25 mg Oral BID   Continuous Infusions:  heparin 800 Units/hr (05/28/18 0637)   PRN Meds:.acetaminophen **OR** acetaminophen, dextrose, [DISCONTINUED]  ondansetron **OR** ondansetron (ZOFRAN) IV, polyethylene glycol   PHYSICAL EXAM: Vital signs: Vitals:   05/27/18 2110 05/28/18 0456 05/28/18 0555 05/28/18 1300  BP: 116/73  118/82 116/80  Pulse: 77  80 77  Resp:      Temp: 99.9 F (37.7 C)  98.1 F (36.7 C) 98.2 F (36.8 C)  TempSrc: Oral  Oral Oral  SpO2: 99%  100% 99%  Weight:  44.3 kg    Height:       Filed Weights   05/25/18 0917 05/26/18 0514 05/28/18 0456  Weight: 45  kg 46.2 kg 44.3 kg   Body mass index is 19.07 kg/m.   General appearance :Awake, alert, not in any distress.  HEENT: Atraumatic and Normocephalic Neck: supple, no JVD. No cervical lymphadenopathy. No thyromegaly Resp:Good air entry bilaterally, no added sounds  CVS: S1 S2 regular, no murmurs.  GI: Bowel sounds present, Non tender and not distended with no gaurding, rigidity or rebound.No organomegaly Extremities: B/L Lower Ext shows no edema, both legs are warm to touch Neurology:  speech clear,Non focal, sensation is grossly intact. Musculoskeletal:No digital cyanosis Skin:No Rash, warm and dry Wounds:N/A  I have personally reviewed following labs and imaging studies  LABORATORY DATA: CBC: Recent Labs  Lab 05/25/18 0914 05/26/18 0335 05/27/18 0334 05/28/18 0256  WBC 11.2* 7.7 6.0 5.2  NEUTROABS 10.0*  --   --   --   HGB 12.4 10.6* 11.0* 10.3*  HCT 38.9 31.9* 34.7* 32.5*  MCV 100.5* 98.8 96.7 97.0  PLT 305 301 296 062    Basic Metabolic Panel: Recent Labs  Lab 05/25/18 0914 05/26/18 0335 05/27/18 0334 05/28/18 0256  NA 143 136 136 139  K 5.1 3.6 4.1 3.7  CL 105 104 103 107  CO2 22 25 27 22   GLUCOSE 74 168* 164* 169*  BUN 16 5* 14 9  CREATININE 0.87 0.66 0.69 0.64  CALCIUM 10.1 8.9 9.0 9.2  MG  --   --  2.1 2.1    GFR: Estimated Creatinine Clearance: 61.5 mL/min (by C-G formula based on SCr of 0.64 mg/dL).  Liver Function Tests: Recent Labs  Lab 05/25/18 0914 05/26/18 0335 05/27/18 0334 05/28/18 0256  AST 104* 55* 36 28  ALT 35 27 26 24   ALKPHOS 53 46 46 41  BILITOT 1.3* 1.3* 0.9 0.4  PROT 7.1 5.9* 6.2* 5.7*  ALBUMIN 3.8 2.9* 2.9* 2.7*   No results for input(s): LIPASE, AMYLASE in the last 168 hours. Recent Labs  Lab 05/26/18 0930  AMMONIA 36*    Coagulation Profile: Recent Labs  Lab 05/26/18 1137  INR 1.3*    Cardiac Enzymes: Recent Labs  Lab 05/25/18 1400 05/25/18 2001 05/27/18 0334  CKTOTAL 1,236* 996* 494*    BNP (last 3  results) No results for input(s): PROBNP in the last 8760 hours.  HbA1C: Recent Labs    05/26/18 0903  HGBA1C 8.3*    CBG: Recent Labs  Lab 05/27/18 1704 05/27/18 2015 05/27/18 2353 05/28/18 0353 05/28/18 1227  GLUCAP 153* 187* 201* 142* 174*    Lipid Profile: No results for input(s): CHOL, HDL, LDLCALC, TRIG, CHOLHDL, LDLDIRECT in the last 72 hours.  Thyroid Function Tests: Recent Labs    05/26/18 1137  TSH 0.215*  FREET4 1.28    Anemia Panel: No results for input(s): VITAMINB12, FOLATE, FERRITIN, TIBC, IRON, RETICCTPCT in the last 72 hours.  Urine analysis:    Component Value Date/Time   COLORURINE YELLOW 05/25/2018 0914   APPEARANCEUR HAZY (A) 05/25/2018  0914   LABSPEC 1.027 05/25/2018 0914   PHURINE 5.0 05/25/2018 0914   GLUCOSEU 50 (A) 05/25/2018 0914   HGBUR NEGATIVE 05/25/2018 0914   BILIRUBINUR NEGATIVE 05/25/2018 0914   KETONESUR 5 (A) 05/25/2018 0914   PROTEINUR 30 (A) 05/25/2018 0914   NITRITE NEGATIVE 05/25/2018 0914   LEUKOCYTESUR TRACE (A) 05/25/2018 0914    Sepsis Labs: Lactic Acid, Venous    Component Value Date/Time   LATICACIDVEN 1.6 05/25/2018 0914    MICROBIOLOGY: Recent Results (from the past 240 hour(s))  Blood Culture (routine x 2)     Status: None (Preliminary result)   Collection Time: 05/25/18  9:10 AM  Result Value Ref Range Status   Specimen Description BLOOD LEFT ANTECUBITAL  Final   Special Requests   Final    BOTTLES DRAWN AEROBIC AND ANAEROBIC Blood Culture adequate volume   Culture   Final    NO GROWTH 3 DAYS Performed at [facility], [address], [place], [place] [address]    Report Status PENDING  Incomplete  Urine culture     Status: None   Collection Time: 05/25/18  9:17 AM  Result Value Ref Range Status   Specimen Description URINE, RANDOM  Final   Special Requests NONE  Final   Culture   Final    NO GROWTH Performed at [facility], [address], [place], [place] [address]     Report Status 05/26/2018 FINAL  Final  SARS Coronavirus 2 [facility] order, Performed in [place] hospital lab)     Status: None   Collection Time: 05/25/18  9:26 AM  Result Value Ref Range Status   SARS Coronavirus 2 NEGATIVE NEGATIVE Final    Comment: (NOTE) If result is NEGATIVE SARS-CoV-2 target nucleic acids are NOT DETECTED. The SARS-CoV-2 RNA is generally detectable in upper and lower  respiratory specimens during the acute phase of infection. The lowest  concentration of SARS-CoV-2 viral copies this assay can detect is 250  copies / mL. A negative result does not preclude SARS-CoV-2 infection  and should not be used as the sole basis for treatment or other  patient management decisions.  A negative result may occur with  improper specimen collection / handling, submission of specimen other  than nasopharyngeal swab, presence of viral mutation(s) within the  areas targeted by this assay, and inadequate number of viral copies  (<250 copies / mL). A negative result must be combined with clinical  observations, patient history, and epidemiological information. If result is POSITIVE SARS-CoV-2 target nucleic acids are DETECTED. The SARS-CoV-2 RNA is generally detectable in upper and lower  respiratory specimens dur ing the acute phase of infection.  Positive  results are indicative of active infection with SARS-CoV-2.  Clinical  correlation with patient history and other diagnostic information is  necessary to determine patient infection status.  Positive results do  not rule out bacterial infection or co-infection with other viruses. If result is PRESUMPTIVE POSTIVE SARS-CoV-2 nucleic acids MAY BE PRESENT.   A presumptive positive result was obtained on the submitted specimen  and confirmed on repeat testing.  While 2019 novel coronavirus  (SARS-CoV-2) nucleic acids may be present in the submitted sample  additional confirmatory testing may be necessary for epidemiological    and / or clinical management purposes  to differentiate between  SARS-CoV-2 and other Sarbecovirus currently known to infect humans.  If clinically indicated additional testing with an alternate test  methodology [phone]) is advised. The SARS-CoV-2 RNA  is generally  detectable in upper and lower respiratory sp ecimens during the acute  phase of infection. The expected result is Negative. Fact Sheet for Patients:  StrictlyIdeas.no Fact Sheet for Healthcare Providers: BankingDealers.co.za This test is not yet approved or cleared by the [place] FDA and has been authorized for detection and/or diagnosis of SARS-CoV-2 by FDA under an Emergency Use Authorization (EUA).  This EUA will remain in effect (meaning this test can be used) for the duration of the COVID-19 declaration under Section 564(b)(1) of the Act, 21 U.S.C. section 360bbb-3(b)(1), unless the authorization is terminated or revoked sooner. Performed at [facility], [place] [address] [place], [place] [address]   Blood Culture (routine x 2)     Status: None (Preliminary result)   Collection Time: 05/25/18  2:29 PM  Result Value Ref Range Status   Specimen Description BLOOD SITE NOT SPECIFIED  Final   Special Requests   Final    BOTTLES DRAWN AEROBIC AND ANAEROBIC Blood Culture results may not be optimal due to an inadequate volume of blood received in culture bottles   Culture   Final    NO GROWTH 3 DAYS Performed at [facility], [place] [address] [place], [place] [address]    Report Status PENDING  Incomplete  Culture, Urine     Status: None   Collection Time: 05/26/18 12:05 PM  Result Value Ref Range Status   Specimen Description URINE, CLEAN CATCH  Final   Special Requests NONE  Final   Culture   Final    NO GROWTH Performed at [facility], [place] [address], [place], [place] [address]    Report Status 05/27/2018 FINAL  Final    RADIOLOGY  STUDIES/RESULTS: Ct Head Wo Contrast  Result Date: 05/25/2018 CLINICAL DATA:  Altered mental status. Currently being treated for urinary tract infection. History of schizophrenia. EXAM: CT HEAD WITHOUT CONTRAST TECHNIQUE: Contiguous axial images were obtained from the base of the skull through the vertex without intravenous contrast. COMPARISON:  None. FINDINGS: Brain: There is no evidence of acute infarct, intracranial hemorrhage, mass, midline shift, or extra-axial fluid collection. The ventricles and sulci are normal. Vascular: No hyperdense vessel. Skull: No fracture or focal osseous lesion. Sinuses/Orbits: Visualized paranasal sinuses and mastoid air cells are clear. Visualized orbits are unremarkable. Other: None. IMPRESSION: Negative head CT. Electronically Signed   By: Logan Bores M.D.   On: 05/25/2018 10:15   Ct Angio Chest Pe W Or Wo Contrast  Result Date: 05/25/2018 CLINICAL DATA:  Diabetes.  Shortness of breath. EXAM: CT ANGIOGRAPHY CHEST WITH CONTRAST TECHNIQUE: Multidetector CT imaging of the chest was performed using the standard protocol during bolus administration of intravenous contrast. Multiplanar CT image reconstructions and MIPs were obtained to evaluate the vascular anatomy. CONTRAST:  157mL OMNIPAQUE IOHEXOL 350 MG/ML SOLN COMPARISON:  04/06/2018 FINDINGS: Cardiovascular: Satisfactory opacification of the pulmonary arteries to the segmental level. Right lower lobe segmental and subsegmental pulmonary emboli. No right heart strain. Normal heart size. No pericardial effusion. Mediastinum/Nodes: No enlarged mediastinal, hilar, or axillary lymph nodes. Thyroid gland, trachea, and esophagus demonstrate no significant findings. Lungs/Pleura: Lungs are clear. No pleural effusion or pneumothorax. Upper Abdomen: No acute abnormality. Musculoskeletal: No chest wall abnormality. No acute or significant osseous findings. Review of the MIP images confirms the above findings. IMPRESSION: 1. Acute  right lower lobe segmental and subsegmental pulmonary emboli. Critical Value/emergent results were called by telephone at the time of interpretation on 05/25/2018 at 1:09  pm to Dr. Davonna Belling , who verbally acknowledged these results. Electronically Signed   By: Kathreen Devoid   On: 05/25/2018 13:10   Mr Brain Wo Contrast  Result Date: 05/27/2018 CLINICAL DATA:  48 year old female with altered mental status and low-grade fever. Right lower lobe subsegmental pulmonary embolus. EXAM: MRI HEAD WITHOUT CONTRAST TECHNIQUE: Multiplanar, multiecho pulse sequences of the brain and surrounding structures were obtained without intravenous contrast. COMPARISON:  Head CT 05/25/2018. FINDINGS: Brain: No restricted diffusion to suggest acute infarction. No midline shift, mass effect, evidence of mass lesion, ventriculomegaly, extra-axial collection or acute intracranial hemorrhage. Cervicomedullary junction and pituitary are within normal limits. Scattered mild to moderate for age nonspecific cerebral white matter T2 and FLAIR hyperintense foci, mostly subcortical. No cortical encephalomalacia. No chronic cerebral blood products. Deep gray matter nuclei, brainstem and cerebellum are within normal limits. Vascular: Major intracranial vascular flow voids are preserved. Skull and upper cervical spine: Negative visible cervical spine. Visualized bone marrow signal is within normal limits. Sinuses/Orbits: Negative. Other: Mastoids are clear. Scalp and face soft tissues appear negative. IMPRESSION: No acute intracranial abnormality. Mild to moderate for age scattered nonspecific cerebral white matter signal changes, most commonly due to small vessel disease. Electronically Signed   By: Genevie Ann M.D.   On: 05/27/2018 09:32   Dg Chest Port 1 View  Result Date: 05/25/2018 CLINICAL DATA:  Altered mental status.  Urinary tract infection EXAM: PORTABLE CHEST 1 VIEW COMPARISON:  None. FINDINGS: Normal mediastinum and cardiac  silhouette. Normal pulmonary vasculature. No evidence of effusion, infiltrate, or pneumothorax. No acute bony abnormality. IMPRESSION: Normal chest radiograph. Electronically Signed   By: Suzy Bouchard M.D.   On: 05/25/2018 10:00     LOS: 3 days   Oren Binet, MD  [facility]  If 7PM-7AM, please contact night-coverage  Please page via [email or web address]  Go to [email or web address] and use [facility]'s universal password to access. If you do not have the password, please contact the hospital operator.  Locate the [facility] provider you are looking for under [facility] and page to a number that you can be directly reached. If you still have difficulty reaching the provider, please page the [facility] (Director on Call) for the Hospitalists listed on amion for assistance.  05/28/2018, 3:07 PM

## 2018-05-28 NOTE — Evaluation (Signed)
Occupational Therapy Evaluation Patient Details Name: Debbie Dorsey MRN: [id number] DOB: [birth year] Today's Date: 05/28/2018    History of Present Illness Pt is a 48 y/o female admitted secondary to AMS. 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 MRI and EEG unremarkable. CT angio revealed PE and pt was started on heparin. PMH includes schizophrenia, DM, and PE.    Clinical Impression   This 48 yo female admitted with above presents to acute OT perseverating on wanting to eat and see her grandfather. Would answer some questions with delay but not answer all questions. Would move spontaneously but not on command. Would not get OOB even with grandfather's encouragement over phone. Not sure grandfather can manage her if she is not mobile.    Follow Up Recommendations  Home health OT;Supervision/Assistance - 24 hour;Other (comment)(however until we can get her to get OOB with therapy, it is hard to say if grandfather and Aide can manage her at home)    Equipment Recommendations  None recommended by OT       Precautions / Restrictions Precautions Precautions: Fall Restrictions Weight Bearing Restrictions: No      Mobility Bed Mobility Overal bed mobility: Needs Assistance             General bed mobility comments: Pt rolled from her left side to her back spontaneously and straight up in bed without A x3, but would not sit up on sit of bed (when I tried to A her, she resisted me)     Balance Overall balance assessment: (unknown due to could not get pt to sit up or stand up with me)                                         ADL either performed or assessed with clinical judgement   ADL                                         General ADL Comments: Per grandfather pta HHAide helped pt with B/D/toileting but she could feed herself (which she did for me). She also washed her face once I gave her washcloth     Vision Baseline  Vision/History: Wears glasses Wears Glasses: At all times Patient Visual Report: (pt did not answer if her vision was different, all she kept saying was "what do you mean" anyway I tried to ask her the same question)              Pertinent Vitals/Pain Pain Assessment: Faces Faces Pain Scale: No hurt     Hand Dominance Right   Extremity/Trunk Assessment Upper Extremity Assessment Upper Extremity Assessment: Overall WFL for tasks assessed(based off of her self feeding and moving around in bed)           Communication Communication Communication: Other (comment)(perservative speech (hungry, want to see my grandfather))   Cognition Arousal/Alertness: Awake/alert Behavior During Therapy: Flat affect Overall Cognitive Status: No family/caregiver present to determine baseline cognitive functioning                                 General Comments: Did speak to grandfather on the phone and he could not understand why she would not get up even with him  encouraging her to do so. Pt quite fixated on "I'm hungry/want something to eat" even after I gave her 2 sugar free jello cups; as well as wanting to see her grandfather (we called him and she spoke to him somewhat--she did not initatie any of the conversation other than "hello" but she did answer 75% of his questions with increased time.     Home Living Family/patient expects to be discharged to:: Private residence Living Arrangements: Other relatives(grandfather) Available Help at Discharge: Family;Available 24 hours/day;Personal care attendant Type of Home: House Home Access: Stairs to enter     Home Layout: One level     Bathroom Shower/Tub: Teacher, early years/pre: Standard     Home Equipment: Shower seat          Prior Functioning/Environment Level of Independence: Needs assistance  Gait / Transfers Assistance Needed: Per grandfather on phone pt was completely independent with mobility ADL's  / Homemaking Assistance Needed: Per grandfather on phone pt had A from aide for bathing/dressing/toileting            OT Problem List: Impaired balance (sitting and/or standing);Decreased cognition      OT Treatment/Interventions: Self-care/ADL training;Balance training;Patient/family education    OT Goals(Current goals can be found in the care plan section) Acute Rehab OT Goals Patient Stated Goal: "to eat and see her grandfather" OT Goal Formulation: Patient unable to participate in goal setting Time For Goal Achievement: 06/11/18 Potential to Achieve Goals: Fair  OT Frequency: Min 2X/week              AM-PAC OT "6 Clicks" Daily Activity     Outcome Measure Help from another person eating meals?: None Help from another person taking care of personal grooming?: A Lot Help from another person toileting, which includes using toliet, bedpan, or urinal?: Total Help from another person bathing (including washing, rinsing, drying)?: Total Help from another person to put on and taking off regular upper body clothing?: Total Help from another person to put on and taking off regular lower body clothing?: Total 6 Click Score: 10   End of Session    Activity Tolerance: Other (comment)(pt limited by cognition) Patient left: in bed;with call bell/phone within reach;with bed alarm set  OT Visit Diagnosis: Other abnormalities of gait and mobility (R26.89);Other symptoms and signs involving cognitive function;Cognitive communication deficit (R41.841)                Time: 1130-1210 OT Time Calculation (min): 40 min Charges:  OT General Charges $OT Visit: 1 Visit OT Evaluation $OT Eval Moderate Complexity: 1 Mod OT Treatments $Self Care/Home Management : 23-37 mins  Golden Circle, OTR/L Acute [employer] Pager [phone] Office [phone]     Almon Register 05/28/2018, 12:55 PM

## 2018-05-28 NOTE — Progress Notes (Signed)
[place] for heparin Indication: pulmonary embolus  No Known Allergies  Patient Measurements: Height: 5' (152.4 cm) Weight: 97 lb 10.6 oz (44.3 kg) IBW/kg (Calculated) : 45.5 Heparin Dosing Weight: 45kg  Vital Signs: Temp: 98.1 F (36.7 C) (04/21 0555) Temp Source: Oral (04/21 0555) BP: 118/82 (04/21 0555) Pulse Rate: 80 (04/21 0555)  Labs: Recent Labs    05/25/18 1400 05/25/18 2001  05/26/18 0335 05/26/18 1137 05/27/18 0334 05/28/18 0256  HGB  --   --    < > 10.6*  --  11.0* 10.3*  HCT  --   --   --  31.9*  --  34.7* 32.5*  PLT  --   --   --  301  --  296 318  APTT  --  67*  --   --   --   --   --   LABPROT  --   --   --   --  16.4*  --   --   INR  --   --   --   --  1.3*  --   --   HEPARINUNFRC <0.10* 0.61  --  0.64  --  0.53 0.65  CREATININE  --   --   --  0.66  --  0.69 0.64  CKTOTAL 1,236* 996*  --   --   --  494*  --    < > = values in this interval not displayed.    Estimated Creatinine Clearance: 61.5 mL/min (by C-G formula based on SCr of 0.64 mg/dL).  Assessment: 45 yof presented to the ED with AMS. She is prescribed Xarelto for a recent PE but has likely either been non-compliant or provider stopped it.  She was found to have an acute PE.   Baseline heparin level is undetectable at <0.1 and would expect to be high if was taking Xarelto - no need to follow aPTT.  Heparin level is therapeutic    Goal of Therapy:  Heparin level 0.3-0.7 units/ml Monitor platelets by anticoagulation protocol: Yes   Plan:  Continue heparin drip at 800 units/hr Daily heparin level and CBC Monitor for s/sx of bleeding   Thank you Anette Guarneri, PharmD [phone]

## 2018-05-28 NOTE — Progress Notes (Signed)
Physical Therapy Treatment Patient Details Name: Debbie Dorsey MRN: [id number] DOB: 1970/09/15 Today's Date: 05/28/2018    History of Present Illness Pt is a 48 y/o female admitted secondary to AMS. 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 MRI and EEG unremarkable. CT angio revealed PE and pt was started on heparin. PMH includes schizophrenia, DM, and PE.     PT Comments    Pt continues to not follow commands when asked but today she decided she would get up OOB and was able to sit straight up in bed, stand up from bed and ambulate down hall. She was given min HHA but really just to keep her walking, not for balance. She urinated in middle of hallway and returned to room for clean up and was able to maintain standing on one leg for cleaning legs and removing briefs. Should be able to d/c home without therapy once she is back to her baseline mental status.     Follow Up Recommendations  No PT follow up     Equipment Recommendations  None recommended by PT    Recommendations for Other Services OT consult     Precautions / Restrictions Precautions Precautions: Fall Restrictions Weight Bearing Restrictions: No    Mobility  Bed Mobility Overal bed mobility: Modified Independent Bed Mobility: Rolling;Supine to Sit;Sit to Supine Rolling: Modified independent (Device/Increase time)   Supine to sit: Modified independent (Device/Increase time) Sit to supine: Modified independent (Device/Increase time)   General bed mobility comments: when pt desires, she is able to roll, sit straight up in bed, and return to supine from sitting.   Transfers Overall transfer level: Modified independent Equipment used: 1 person hand held assist             General transfer comment: once pt decided she would stand, she was able to do so on her own  Ambulation/Gait Ambulation/Gait assistance: Min assist Gait Distance (Feet): 100 Feet Assistive device: 1 person hand held  assist Gait Pattern/deviations: Step-through pattern Gait velocity: decreased Gait velocity interpretation: >2.62 ft/sec, indicative of community ambulatory General Gait Details: HHA given really just to keep her moving, she was not relying on HHA for balance, she is steady and able to walk on her own. After 50', pt stopped in hallway and urinated, returned to room after that for clean up   Stairs             Wheelchair Mobility    Modified Rankin (Stroke Patients Only)       Balance Overall balance assessment: No apparent balance deficits (not formally assessed)                                          Cognition Arousal/Alertness: Awake/alert Behavior During Therapy: Flat affect Overall Cognitive Status: No family/caregiver present to determine baseline cognitive functioning                                 General Comments: pt would say "yes" but not act on it. The only full sentences that she said were, "I want to go home, I want my grandfather, I'm hungry, yes, I like to cook"      Exercises      General Comments General comments (skin integrity, edema, etc.): pt able to maintain standing on one leg to remove briefs  and be cleaned up.       Pertinent Vitals/Pain Pain Assessment: Faces Faces Pain Scale: No hurt    Home Living Family/patient expects to be discharged to:: Private residence Living Arrangements: Other relatives(grandfather) Available Help at Discharge: Family;Available 24 hours/day;Personal care attendant Type of Home: House Home Access: Stairs to enter   Home Layout: One level Home Equipment: Shower seat      Prior Function Level of Independence: Needs assistance  Gait / Transfers Assistance Needed: Per grandfather on phone pt was completely independent with mobility ADL's / Homemaking Assistance Needed: Per grandfather on phone pt had A from aide for bathing/dressing/toileting     PT Goals (current goals  can now be found in the care plan section) Acute Rehab PT Goals Patient Stated Goal: "to eat and see her grandfather" PT Goal Formulation: Patient unable to participate in goal setting Time For Goal Achievement: 06/10/18 Potential to Achieve Goals: Fair Progress towards PT goals: Progressing toward goals    Frequency    Min 3X/week      PT Plan Current plan remains appropriate    Co-evaluation              AM-PAC PT "6 Clicks" Mobility   Outcome Measure  Help needed turning from your back to your side while in a flat bed without using bedrails?: None Help needed moving from lying on your back to sitting on the side of a flat bed without using bedrails?: None Help needed moving to and from a bed to a chair (including a wheelchair)?: None Help needed standing up from a chair using your arms (e.g., wheelchair or bedside chair)?: None Help needed to walk in hospital room?: None Help needed climbing 3-5 steps with a railing? : None 6 Click Score: 24    End of Session   Activity Tolerance: Patient tolerated treatment well Patient left: in bed;with call bell/phone within reach;with bed alarm set Nurse Communication: Mobility status PT Visit Diagnosis: Other abnormalities of gait and mobility (R26.89);Difficulty in walking, not elsewhere classified (R26.2);Other symptoms and signs involving the nervous system (R29.898)     Time: 3664-4034 PT Time Calculation (min) (ACUTE ONLY): 24 min  Charges:  $Gait Training: 8-22 mins $Therapeutic Activity: 8-22 mins                     Leighton Roach, [place]  Pager [phone] Office [place] 05/28/2018, 3:14 PM

## 2018-05-29 LAB — GLUCOSE, CAPILLARY
Glucose-Capillary: 104 mg/dL — ABNORMAL HIGH (ref 70–99)
Glucose-Capillary: 107 mg/dL — ABNORMAL HIGH (ref 70–99)
Glucose-Capillary: 155 mg/dL — ABNORMAL HIGH (ref 70–99)
Glucose-Capillary: 164 mg/dL — ABNORMAL HIGH (ref 70–99)
Glucose-Capillary: 219 mg/dL — ABNORMAL HIGH (ref 70–99)
Glucose-Capillary: 248 mg/dL — ABNORMAL HIGH (ref 70–99)
Glucose-Capillary: 312 mg/dL — ABNORMAL HIGH (ref 70–99)

## 2018-05-29 LAB — CBC
HCT: 34.6 % — ABNORMAL LOW (ref 36.0–46.0)
Hemoglobin: 11.5 g/dL — ABNORMAL LOW (ref 12.0–15.0)
MCH: 31.9 pg (ref 26.0–34.0)
MCHC: 33.2 g/dL (ref 30.0–36.0)
MCV: 95.8 fL (ref 80.0–100.0)
Platelets: 335 10*3/uL (ref 150–400)
RBC: 3.61 MIL/uL — ABNORMAL LOW (ref 3.87–5.11)
RDW: 12.2 % (ref 11.5–15.5)
WBC: 5.5 10*3/uL (ref 4.0–10.5)
nRBC: 0 % (ref 0.0–0.2)

## 2018-05-29 LAB — COMPREHENSIVE METABOLIC PANEL
ALT: 26 U/L (ref 0–44)
AST: 22 U/L (ref 15–41)
Albumin: 3 g/dL — ABNORMAL LOW (ref 3.5–5.0)
Alkaline Phosphatase: 41 U/L (ref 38–126)
Anion gap: 9 (ref 5–15)
BUN: 9 mg/dL (ref 6–20)
CO2: 21 mmol/L — ABNORMAL LOW (ref 22–32)
Calcium: 9.3 mg/dL (ref 8.9–10.3)
Chloride: 106 mmol/L (ref 98–111)
Creatinine, Ser: 0.66 mg/dL (ref 0.44–1.00)
GFR calc Af Amer: >60 mL/min (ref >60)
GFR calc non Af Amer: >60 mL/min (ref >60)
Glucose, Bld: 143 mg/dL — ABNORMAL HIGH (ref 70–99)
Potassium: 4.1 mmol/L (ref 3.5–5.1)
Sodium: 136 mmol/L (ref 135–145)
Total Bilirubin: 0.3 mg/dL (ref 0.3–1.2)
Total Protein: 6.2 g/dL — ABNORMAL LOW (ref 6.5–8.1)

## 2018-05-29 LAB — HEPARIN LEVEL (UNFRACTIONATED): Heparin Unfractionated: 0.88 IU/mL — ABNORMAL HIGH (ref 0.30–0.70)

## 2018-05-29 MED ORDER — RIVAROXABAN 15 MG PO TABS
15.0000 mg | ORAL_TABLET | Freq: Two times a day (BID) | ORAL | Status: DC
  Administered 2018-05-29 – 2018-05-31 (×4): 15 mg via ORAL
  Filled 2018-05-29 (×5): qty 1

## 2018-05-29 NOTE — Progress Notes (Signed)
[place] for heparin to Xarelto Indication: pulmonary embolus  No Known Allergies  Patient Measurements: Height: 5' (152.4 cm) Weight: 97 lb 10.6 oz (44.3 kg) IBW/kg (Calculated) : 45.5 Heparin Dosing Weight: 45kg  Vital Signs: Temp: 98 F (36.7 C) (04/22 0933) Temp Source: Oral (04/22 0933) BP: 82/62 (04/22 0933) Pulse Rate: 93 (04/22 0933)  Labs: Recent Labs    05/26/18 1137  05/27/18 0334 05/28/18 0256 05/29/18 0256  HGB  --    < > 11.0* 10.3* 11.5*  HCT  --   --  34.7* 32.5* 34.6*  PLT  --   --  296 318 335  LABPROT 16.4*  --   --   --   --   INR 1.3*  --   --   --   --   HEPARINUNFRC  --   --  0.53 0.65 0.88*  CREATININE  --   --  0.69 0.64 0.66  CKTOTAL  --   --  494*  --   --    < > = values in this interval not displayed.    Estimated Creatinine Clearance: 61.5 mL/min (by C-G formula based on SCr of 0.66 mg/dL).  Assessment: 51 yof presented to the ED with AMS. She is prescribed Xarelto for a recent PE but has likely either been non-compliant or provider stopped it.  She was found to have an acute PE.   Baseline heparin level is undetectable at <0.1 and would expect to be high if was taking Xarelto - no need to follow aPTT.  Transitioning back to Xarelto  Goal of Therapy:  Monitor platelets by anticoagulation protocol: Yes   Plan:  Xarelto 15 mg po bid x 3 weeks then 20 mg daily Continue to follow   Thank you Anette Guarneri, PharmD

## 2018-05-29 NOTE — Care Management Important Message (Signed)
Important Message  Patient Details  Name: Debbie Dorsey MRN: [id number] Date of Birth: October 14, 1970   Medicare Important Message Given:  Yes    Starletta Houchin 05/29/2018, 3:10 PM

## 2018-05-29 NOTE — TOC Initial Note (Signed)
Transition of Care [facility]) - Initial/Assessment Note    Patient Details  Name: Debbie Dorsey MRN: [id number] Date of Birth: 04/18/1970  Transition of Care [facility]) CM/SW Contact:    Carles Collet, RN Phone Number: 05/29/2018, 9:49 AM  Clinical Narrative:                Patient is a 48 y.o. female with prior history of schizophrenia, DM-2, pulmonary embolism-presented with altered mental status-thought to be secondary to toxic metabolic encephalopathy. Patient lives at home w grandfather. She has history of poor compliance with medications. CM requested order for [facility] RN. Awaiting mental status to clear prior to speaking with patient and offering choice of [facility] provider. PT eval shows no need for therapy at or DME at DC.  Patient was on Xaralto PTA, no benefit check needed, will restart at PE dose.  CM will continue to follow.     Expected Discharge Plan: [place] Barriers to Discharge: Continued Medical Work up   Patient Goals and CMS Choice        Expected Discharge Plan and Services Expected Discharge Plan: [place]   Discharge Planning Services: CM Consult                              Prior Living Arrangements/Services   Lives with:: Relatives(grandfather)              Current home services: [facility] aide([facility])    Activities of Daily Living Home Assistive Devices/Equipment: Eyeglasses ADL Screening (condition at time of admission) Patient's cognitive ability adequate to safely complete daily activities?: No Is the patient deaf or have difficulty hearing?: No Does the patient have difficulty seeing, even when wearing glasses/contacts?: Yes Does the patient have difficulty concentrating, remembering, or making decisions?: No Patient able to express need for assistance with ADLs?: Yes Does the patient have difficulty dressing or bathing?: Yes Independently performs ADLs?: No Communication: Needs assistance Is  this a change from baseline?: Pre-admission baseline Dressing (OT): Needs assistance Is this a change from baseline?: Pre-admission baseline Grooming: Needs assistance Is this a change from baseline?: Pre-admission baseline Feeding: Independent Bathing: Needs assistance Is this a change from baseline?: Pre-admission baseline Toileting: Needs assistance Is this a change from baseline?: Pre-admission baseline In/Out Bed: Independent Walks in Home: Independent Does the patient have difficulty walking or climbing stairs?: No Weakness of Legs: Both Weakness of Arms/Hands: Both  Permission Sought/Granted                  Emotional Assessment              Admission diagnosis:  Tachycardia [R00.0] Encephalopathy [G93.40] Patient Active Problem List   Diagnosis Date Noted  . Altered mental status   . Acute encephalopathy 05/25/2018  . Acute pulmonary embolism ([place]) 05/25/2018  . Type 2 diabetes mellitus ([place]) 05/25/2018  . Schizophrenia ([place]) 04/08/2018  . Paranoid schizophrenia ([place]) 04/02/2018   PCP:  Prince Solian, MD Pharmacy:   [facility] [place], [place] AT [address] [place] [address] Phone: [phone] Fax: [phone]     Social Determinants of Health (SDOH) Interventions    Readmission Risk Interventions No flowsheet data found.
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
## 2018-05-29 NOTE — Progress Notes (Signed)
ANTICOAGULATION CONSULT NOTE  Pharmacy Consult for heparin Indication: pulmonary embolus  No Known Allergies  Patient Measurements: Height: 5' (152.4 cm) Weight: 97 lb 10.6 oz (44.3 kg) IBW/kg (Calculated) : 45.5 Heparin Dosing Weight: 45kg  Vital Signs: Temp: 99.2 F (37.3 C) (04/21 2029) Temp Source: Oral (04/21 2029) BP: 103/69 (04/21 2029) Pulse Rate: 80 (04/21 2029)  Labs: Recent Labs    05/26/18 1137  05/27/18 0334 05/28/18 0256 05/29/18 0256  HGB  --    < > 11.0* 10.3* 11.5*  HCT  --   --  34.7* 32.5* 34.6*  PLT  --   --  296 318 335  LABPROT 16.4*  --   --   --   --   INR 1.3*  --   --   --   --   HEPARINUNFRC  --   --  0.53 0.65 0.88*  CREATININE  --   --  0.69 0.64  --   CKTOTAL  --   --  494*  --   --    < > = values in this interval not displayed.    Estimated Creatinine Clearance: 61.5 mL/min (by C-G formula based on SCr of 0.64 mg/dL).  Assessment: 28 yof presented to the ED with AMS. She is prescribed Xarelto for a recent PE but has likely either been non-compliant or provider stopped it.  She was found to have an acute PE.   Baseline heparin level is undetectable at <0.1 and would expect to be high if was taking Xarelto - no need to follow aPTT.  Heparin level is now 0.88 units/ml.  No bleeding reported  Goal of Therapy:  Heparin level 0.3-0.7 units/ml Monitor platelets by anticoagulation protocol: Yes   Plan:  Decrease heparin drip to 650 units/hr Check heparin level in 6-8 hours Daily heparin level and CBC Monitor for s/sx of bleeding   Thank you Excell Seltzer, PharmD Clinical Pharmacy

## 2018-05-29 NOTE — Progress Notes (Signed)
Occupational Therapy Treatment Patient Details Name: Debbie Dorsey MRN: [id number] DOB: 1970-09-28 Today's Date: 05/29/2018    History of present illness Pt is a 48 y/o female admitted secondary to AMS. 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 MRI and EEG unremarkable. CT angio revealed PE and pt was started on heparin. PMH includes schizophrenia, DM, and PE.    OT comments  Pt performing minimally. Pt was told by nurse and grandfather to participate with OT today and minimal grooming performed at bed level. Pt performing bed mobility in bed rolling side to side and moving BLEs. Pt resisted OTR assisting with bed mobility from supine to sitting EOB x5 attempts- Pt holding hand out, but pulled hand back each time resisting to assist. Pt max cueing. Pt washed face with wash cloth. Pt would greatly benefit from continued OT skilled services for ADL, mobility and safety in [facility] setting. OT to follow acutely.     Follow Up Recommendations  Home health OT;Supervision/Assistance - 24 hour    Equipment Recommendations  None recommended by OT    Recommendations for Other Services      Precautions / Restrictions Precautions Precautions: Fall Restrictions Weight Bearing Restrictions: No       Mobility Bed Mobility Overal bed mobility: Modified Independent Bed Mobility: Rolling;Supine to Sit;Sit to Supine     Supine to sit: Modified independent (Device/Increase time) Sit to supine: Modified independent (Device/Increase time)   General bed mobility comments: Pt demonstrating generalized muscle weakness  Transfers Overall transfer level: Modified independent Equipment used: 1 person hand held assist             General transfer comment: unable to test    Balance Overall balance assessment: No apparent balance deficits (not formally assessed)                                         ADL either performed or assessed with clinical judgement    ADL Overall ADL's : Needs assistance/impaired Eating/Feeding: Supervision/ safety;Bed level       Upper Body Bathing: Minimal assistance;Bed level   Lower Body Bathing: Maximal assistance;Bed level                       Functional mobility during ADLs: Min guard(Up with PTA in hallway) General ADL Comments: Per grandfather pta HHAide helped pt with B/D/toileting Pt able to wash face with max cueing in bed level. Pt would reach arm out for sitting to EOB, but would not assist; therefore totalA required and then pt would pull hand away to resist any movement. Attempting to sit EOB x5 times.     Vision   Vision Assessment?: No apparent visual deficits   Perception     Praxis      Cognition Arousal/Alertness: Awake/alert Behavior During Therapy: Agitated(not following commands) Overall Cognitive Status: No family/caregiver present to determine baseline cognitive functioning                                 General Comments: Combative when taking covers off. Pt had just spoken to grandfather right before OT came in.        Exercises     Shoulder Instructions       General Comments Checked on pt a few times today and unable to assist with  bed mobility or LB ADL. pt was ambulating in hallway with PTA.    Pertinent Vitals/ Pain       Pain Assessment: Faces Faces Pain Scale: No hurt  Home Living                                          Prior Functioning/Environment              Frequency  Min 2X/week        Progress Toward Goals  OT Goals(current goals can now be found in the care plan section)  Progress towards OT goals: Progressing toward goals  Acute Rehab OT Goals Patient Stated Goal: "to eat and see her grandfather" OT Goal Formulation: Patient unable to participate in goal setting Time For Goal Achievement: 06/11/18 Potential to Achieve Goals: Fair ADL Goals Pt Will Perform Grooming: with supervision;with  set-up Pt Will Transfer to Toilet: with supervision;ambulating;regular height toilet;grab bars Additional ADL Goal #1: Pt will sit on EOB with S Additional ADL Goal #2: Pt will be able to ambulate around her room with S  Plan Discharge plan remains appropriate    Co-evaluation                 AM-PAC OT "6 Clicks" Daily Activity     Outcome Measure   Help from another person eating meals?: None Help from another person taking care of personal grooming?: A Lot Help from another person toileting, which includes using toliet, bedpan, or urinal?: Total Help from another person bathing (including washing, rinsing, drying)?: Total Help from another person to put on and taking off regular upper body clothing?: A Lot Help from another person to put on and taking off regular lower body clothing?: Total 6 Click Score: 11    End of Session    OT Visit Diagnosis: Other abnormalities of gait and mobility (R26.89);Other symptoms and signs involving cognitive function;Cognitive communication deficit (R41.841) Symptoms and signs involving cognitive functions: Other Nontraumatic ICH   Activity Tolerance Treatment limited secondary to agitation   Patient Left in bed;with call bell/phone within reach;with bed alarm set   Nurse Communication Mobility status        Time: 8657-8469 OT Time Calculation (min): 16 min  Charges: OT General Charges $OT Visit: 1 Visit OT Treatments $Self Care/Home Management : 8-22 mins  Darryl Nestle) Marsa Aris OTR/L Acute Rehabilitation Services Pager: [phone] Office: [phone]    Audie Pinto 05/29/2018, 3:49 PM

## 2018-05-29 NOTE — Progress Notes (Signed)
PROGRESS NOTE        PATIENT DETAILS Name: Debbie Dorsey Age: 48 y.o. Sex: female Date of Birth: May 08, 1970 Admit Date: 05/25/2018 Admitting Physician Oda Kilts, MD YTK:ZSWF, Ravisankar, MD  Brief Narrative: Patient is a 48 y.o. female with prior history of schizophrenia, DM-2, pulmonary embolism-presented with altered mental status-thought to be secondary to toxic metabolic encephalopathy vs Neuroleptic Malignant Syndrome.  Subjective: Continues slowly improve-at times blank stare-but will talk to you at times.  Assessment/Plan: Acute toxic metabolic encephalopathy: Etiology thought to be either toxic metabolic encephalopathy or related to neuroleptic malignant syndrome secondary to antipsychotic medication use.  MRI brain without any acute changes.  EEG negative for seizures.  All antipsychotics on hold-improving with just supportive care.  Neurology has signed off.  Evaluated by psychiatry-recommendations are to continue to hold antipsychotics-but since improving-we will plan on reconsulting psychiatry on 4/23 to see if we can restart some of her antipsychotics.  Pulmonary embolism: Unfortunately noncompliant with Della Goo recently had a pulmonary embolism in February.  On IV heparin-have transition to Xarelto.  Spoke with grandfather over the phone-needs at least 6 months of uninterrupted anticoagulation.  Have recommended that the patient see a hematologist before discontinuing anticoagulation  DM-2: CBG stable-continue SSI  Mildly suppressed TSH with normal free T4: Could be mild subclinical hypothyroidism-repeat thyroid function in 3 months.  Asymptomatic bacteriuria: No longer on Rocephin-urine culture negative.  Doubt patient had UTI  History of paranoid schizophrenia: Due to concern for neuroleptic malignant syndrome-all antipsychotics on hold. Will plan on reconsulting psychiatry on 4/23   DVT Prophylaxis: IV heparin  Code  Status: Full code   Family Communication: Grandfather over the phone  Disposition Plan: Remain inpatient  Antimicrobial agents: Anti-infectives (From admission, onward)   Start     Dose/Rate Route Frequency Ordered Stop   05/26/18 1100  vancomycin (VANCOCIN) IVPB 750 mg/150 ml premix  Status:  Discontinued     750 mg 150 mL/hr over 60 Minutes Intravenous Every 24 hours 05/25/18 1024 05/25/18 1229   05/26/18 0830  cefTRIAXone (ROCEPHIN) 1 g in sodium chloride 0.9 % 100 mL IVPB     1 g 200 mL/hr over 30 Minutes Intravenous Every 24 hours 05/26/18 0748 05/28/18 1517   05/25/18 2300  ceFEPIme (MAXIPIME) 2 g in sodium chloride 0.9 % 100 mL IVPB  Status:  Discontinued     2 g 200 mL/hr over 30 Minutes Intravenous Every 12 hours 05/25/18 1022 05/25/18 1229   05/25/18 1015  ceFEPIme (MAXIPIME) 2 g in sodium chloride 0.9 % 100 mL IVPB     2 g 200 mL/hr over 30 Minutes Intravenous  Once 05/25/18 1010 05/25/18 1631   05/25/18 1015  metroNIDAZOLE (FLAGYL) IVPB 500 mg  Status:  Discontinued     500 mg 100 mL/hr over 60 Minutes Intravenous  Once 05/25/18 1010 05/25/18 1229   05/25/18 1015  vancomycin (VANCOCIN) IVPB 1000 mg/200 mL premix  Status:  Discontinued     1,000 mg 200 mL/hr over 60 Minutes Intravenous  Once 05/25/18 1010 05/25/18 1235      Procedures: None  CONSULTS:  neurology and psychiatry  Time spent: 25 minutes-Greater than 50% of this time was spent in counseling, explanation of diagnosis, planning of further management, and coordination of care.  MEDICATIONS: Scheduled Meds:  clonazePAM  0.5 mg Oral QHS   feeding supplement (  GLUCERNA SHAKE)  237 mL Oral TID BM   feeding supplement (PRO-STAT SUGAR FREE 64)  30 mL Oral TID WC   insulin aspart  0-15 Units Subcutaneous Q4H   metoprolol tartrate  25 mg Oral BID   Rivaroxaban  15 mg Oral BID   Continuous Infusions:  PRN Meds:.acetaminophen **OR** acetaminophen, dextrose, [DISCONTINUED] ondansetron **OR**  ondansetron (ZOFRAN) IV, polyethylene glycol   PHYSICAL EXAM: Vital signs: Vitals:   05/28/18 1300 05/28/18 2029 05/29/18 0424 05/29/18 0933  BP: 116/80 103/69 108/72 (!) 82/62  Pulse: 77 80 77 93  Resp:  18 18 18   Temp: 98.2 F (36.8 C) 99.2 F (37.3 C) 98 F (36.7 C) 98 F (36.7 C)  TempSrc: Oral Oral Oral Oral  SpO2: 99% 100% 98% 98%  Weight:      Height:       Filed Weights   05/25/18 0917 05/26/18 0514 05/28/18 0456  Weight: 45 kg 46.2 kg 44.3 kg   Body mass index is 19.07 kg/m.   General appearance:Awake, alert, not in any distress.  Talks to me in a few word sentences-but apparently has talked much more with the nurses and with her grandfather over the phone. Eyes:no scleral icterus. HEENT: Atraumatic and Normocephalic Neck: supple, no JVD. Resp:Good air entry bilaterally,no rales or rhonchi CVS: S1 S2 regular, no murmurs.  GI: Bowel sounds present, Non tender and not distended with no gaurding, rigidity or rebound. Extremities: B/L Lower Ext shows no edema, both legs are warm to touch Neurology:  Non focal Musculoskeletal:No digital cyanosis Skin:No Rash, warm and dry Wounds:N/A  I have personally reviewed following labs and imaging studies  LABORATORY DATA: CBC: Recent Labs  Lab 05/25/18 0914 05/26/18 0335 05/27/18 0334 05/28/18 0256 05/29/18 0256  WBC 11.2* 7.7 6.0 5.2 5.5  NEUTROABS 10.0*  --   --   --   --   HGB 12.4 10.6* 11.0* 10.3* 11.5*  HCT 38.9 31.9* 34.7* 32.5* 34.6*  MCV 100.5* 98.8 96.7 97.0 95.8  PLT 305 301 296 318 811    Basic Metabolic Panel: Recent Labs  Lab 05/25/18 0914 05/26/18 0335 05/27/18 0334 05/28/18 0256 05/29/18 0256  NA 143 136 136 139 136  K 5.1 3.6 4.1 3.7 4.1  CL 105 104 103 107 106  CO2 22 25 27 22  21*  GLUCOSE 74 168* 164* 169* 143*  BUN 16 5* 14 9 9   CREATININE 0.87 0.66 0.69 0.64 0.66  CALCIUM 10.1 8.9 9.0 9.2 9.3  MG  --   --  2.1 2.1  --     GFR: Estimated Creatinine Clearance: 61.5 mL/min (by  C-G formula based on SCr of 0.66 mg/dL).  Liver Function Tests: Recent Labs  Lab 05/25/18 0914 05/26/18 0335 05/27/18 0334 05/28/18 0256 05/29/18 0256  AST 104* 55* 36 28 22  ALT 35 27 26 24 26   ALKPHOS 53 46 46 41 41  BILITOT 1.3* 1.3* 0.9 0.4 0.3  PROT 7.1 5.9* 6.2* 5.7* 6.2*  ALBUMIN 3.8 2.9* 2.9* 2.7* 3.0*   No results for input(s): LIPASE, AMYLASE in the last 168 hours. Recent Labs  Lab 05/26/18 0930  AMMONIA 36*    Coagulation Profile: Recent Labs  Lab 05/26/18 1137  INR 1.3*    Cardiac Enzymes: Recent Labs  Lab 05/25/18 1400 05/25/18 2001 05/27/18 0334  CKTOTAL 1,236* 996* 494*    BNP (last 3 results) No results for input(s): PROBNP in the last 8760 hours.  HbA1C: No results for input(s): HGBA1C in the  last 72 hours.  CBG: Recent Labs  Lab 05/28/18 2023 05/29/18 0010 05/29/18 0422 05/29/18 0743 05/29/18 1109  GLUCAP 219* 107* 164* 155* 248*    Lipid Profile: No results for input(s): CHOL, HDL, LDLCALC, TRIG, CHOLHDL, LDLDIRECT in the last 72 hours.  Thyroid Function Tests: Recent Labs    05/26/18 1137  TSH 0.215*  FREET4 1.28    Anemia Panel: No results for input(s): VITAMINB12, FOLATE, FERRITIN, TIBC, IRON, RETICCTPCT in the last 72 hours.  Urine analysis:    Component Value Date/Time   COLORURINE YELLOW 05/25/2018 0914   APPEARANCEUR HAZY (A) 05/25/2018 0914   LABSPEC 1.027 05/25/2018 0914   PHURINE 5.0 05/25/2018 0914   GLUCOSEU 50 (A) 05/25/2018 0914   HGBUR NEGATIVE 05/25/2018 0914   BILIRUBINUR NEGATIVE 05/25/2018 0914   KETONESUR 5 (A) 05/25/2018 0914   PROTEINUR 30 (A) 05/25/2018 0914   NITRITE NEGATIVE 05/25/2018 0914   LEUKOCYTESUR TRACE (A) 05/25/2018 0914    Sepsis Labs: Lactic Acid, Venous    Component Value Date/Time   LATICACIDVEN 1.6 05/25/2018 0914    MICROBIOLOGY: Recent Results (from the past 240 hour(s))  Blood Culture (routine x 2)     Status: None (Preliminary result)   Collection Time:  05/25/18  9:10 AM  Result Value Ref Range Status   Specimen Description BLOOD LEFT ANTECUBITAL  Final   Special Requests   Final    BOTTLES DRAWN AEROBIC AND ANAEROBIC Blood Culture adequate volume   Culture   Final    NO GROWTH 3 DAYS Performed at [facility], [address], [place], [place] [address]    Report Status PENDING  Incomplete  Urine culture     Status: None   Collection Time: 05/25/18  9:17 AM  Result Value Ref Range Status   Specimen Description URINE, RANDOM  Final   Special Requests NONE  Final   Culture   Final    NO GROWTH Performed at [facility], [address] [place], [place] [address]    Report Status 05/26/2018 FINAL  Final  SARS Coronavirus 2 [facility] order, Performed in [place] hospital lab)     Status: None   Collection Time: 05/25/18  9:26 AM  Result Value Ref Range Status   SARS Coronavirus 2 NEGATIVE NEGATIVE Final    Comment: (NOTE) If result is NEGATIVE SARS-CoV-2 target nucleic acids are NOT DETECTED. The SARS-CoV-2 RNA is generally detectable in upper and lower  respiratory specimens during the acute phase of infection. The lowest  concentration of SARS-CoV-2 viral copies this assay can detect is 250  copies / mL. A negative result does not preclude SARS-CoV-2 infection  and should not be used as the sole basis for treatment or other  patient management decisions.  A negative result may occur with  improper specimen collection / handling, submission of specimen other  than nasopharyngeal swab, presence of viral mutation(s) within the  areas targeted by this assay, and inadequate number of viral copies  (<250 copies / mL). A negative result must be combined with clinical  observations, patient history, and epidemiological information. If result is POSITIVE SARS-CoV-2 target nucleic acids are DETECTED. The SARS-CoV-2 RNA is generally detectable in upper and lower  respiratory specimens dur ing the acute phase of infection.   Positive  results are indicative of active infection with SARS-CoV-2.  Clinical  correlation with patient history and other diagnostic information is  necessary to determine patient infection status.  Positive results do  not rule out bacterial infection or co-infection with other viruses. If result is PRESUMPTIVE POSTIVE SARS-CoV-2 nucleic acids MAY BE PRESENT.   A presumptive positive result was obtained on the submitted specimen  and confirmed on repeat testing.  While 2019 novel coronavirus  (SARS-CoV-2) nucleic acids may be present in the submitted sample  additional confirmatory testing may be necessary for epidemiological  and / or clinical management purposes  to differentiate between  SARS-CoV-2 and other Sarbecovirus currently known to infect humans.  If clinically indicated additional testing with an alternate test  methodology [phone]) is advised. The SARS-CoV-2 RNA is generally  detectable in upper and lower respiratory sp ecimens during the acute  phase of infection. The expected result is Negative. Fact Sheet for Patients:  StrictlyIdeas.no Fact Sheet for Healthcare Providers: BankingDealers.co.za This test is not yet approved or cleared by the [place] FDA and has been authorized for detection and/or diagnosis of SARS-CoV-2 by FDA under an Emergency Use Authorization (EUA).  This EUA will remain in effect (meaning this test can be used) for the duration of the COVID-19 declaration under Section 564(b)(1) of the Act, 21 U.S.C. section 360bbb-3(b)(1), unless the authorization is terminated or revoked sooner. Performed at [facility], [place] [address], [place], [place] [address]   Blood Culture (routine x 2)     Status: None (Preliminary result)   Collection Time: 05/25/18  2:29 PM  Result Value Ref Range Status   Specimen Description BLOOD SITE NOT SPECIFIED  Final   Special Requests   Final    BOTTLES DRAWN  AEROBIC AND ANAEROBIC Blood Culture results may not be optimal due to an inadequate volume of blood received in culture bottles   Culture   Final    NO GROWTH 3 DAYS Performed at [facility], [place] [address] [place], [place] [address]    Report Status PENDING  Incomplete  Culture, Urine     Status: None   Collection Time: 05/26/18 12:05 PM  Result Value Ref Range Status   Specimen Description URINE, CLEAN CATCH  Final   Special Requests NONE  Final   Culture   Final    NO GROWTH Performed at [facility], [place] [address], [place], [place] [address]    Report Status 05/27/2018 FINAL  Final    RADIOLOGY STUDIES/RESULTS: Ct Head Wo Contrast  Result Date: 05/25/2018 CLINICAL DATA:  Altered mental status. Currently being treated for urinary tract infection. History of schizophrenia. EXAM: CT HEAD WITHOUT CONTRAST TECHNIQUE: Contiguous axial images were obtained from the base of the skull through the vertex without intravenous contrast. COMPARISON:  None. FINDINGS: Brain: There is no evidence of acute infarct, intracranial hemorrhage, mass, midline shift, or extra-axial fluid collection. The ventricles and sulci are normal. Vascular: No hyperdense vessel. Skull: No fracture or focal osseous lesion. Sinuses/Orbits: Visualized paranasal sinuses and mastoid air cells are clear. Visualized orbits are unremarkable. Other: None. IMPRESSION: Negative head CT. Electronically Signed   By: Logan Bores M.D.   On: 05/25/2018 10:15   Ct Angio Chest Pe W Or Wo Contrast  Result Date: 05/25/2018 CLINICAL DATA:  Diabetes.  Shortness of breath. EXAM: CT ANGIOGRAPHY CHEST WITH CONTRAST TECHNIQUE: Multidetector CT imaging of the chest was performed using the standard protocol during bolus administration of intravenous contrast. Multiplanar CT image reconstructions and MIPs were obtained to evaluate the vascular anatomy. CONTRAST:  118mL OMNIPAQUE IOHEXOL 350 MG/ML SOLN COMPARISON:  04/06/2018 FINDINGS:  Cardiovascular: Satisfactory opacification of the  pulmonary arteries to the segmental level. Right lower lobe segmental and subsegmental pulmonary emboli. No right heart strain. Normal heart size. No pericardial effusion. Mediastinum/Nodes: No enlarged mediastinal, hilar, or axillary lymph nodes. Thyroid gland, trachea, and esophagus demonstrate no significant findings. Lungs/Pleura: Lungs are clear. No pleural effusion or pneumothorax. Upper Abdomen: No acute abnormality. Musculoskeletal: No chest wall abnormality. No acute or significant osseous findings. Review of the MIP images confirms the above findings. IMPRESSION: 1. Acute right lower lobe segmental and subsegmental pulmonary emboli. Critical Value/emergent results were called by telephone at the time of interpretation on 05/25/2018 at 1:09 pm to Dr. Davonna Belling , who verbally acknowledged these results. Electronically Signed   By: Kathreen Devoid   On: 05/25/2018 13:10   Mr Brain Wo Contrast  Result Date: 05/27/2018 CLINICAL DATA:  48 year old female with altered mental status and low-grade fever. Right lower lobe subsegmental pulmonary embolus. EXAM: MRI HEAD WITHOUT CONTRAST TECHNIQUE: Multiplanar, multiecho pulse sequences of the brain and surrounding structures were obtained without intravenous contrast. COMPARISON:  Head CT 05/25/2018. FINDINGS: Brain: No restricted diffusion to suggest acute infarction. No midline shift, mass effect, evidence of mass lesion, ventriculomegaly, extra-axial collection or acute intracranial hemorrhage. Cervicomedullary junction and pituitary are within normal limits. Scattered mild to moderate for age nonspecific cerebral white matter T2 and FLAIR hyperintense foci, mostly subcortical. No cortical encephalomalacia. No chronic cerebral blood products. Deep gray matter nuclei, brainstem and cerebellum are within normal limits. Vascular: Major intracranial vascular flow voids are preserved. Skull and upper cervical  spine: Negative visible cervical spine. Visualized bone marrow signal is within normal limits. Sinuses/Orbits: Negative. Other: Mastoids are clear. Scalp and face soft tissues appear negative. IMPRESSION: No acute intracranial abnormality. Mild to moderate for age scattered nonspecific cerebral white matter signal changes, most commonly due to small vessel disease. Electronically Signed   By: Genevie Ann M.D.   On: 05/27/2018 09:32   Dg Chest Port 1 View  Result Date: 05/25/2018 CLINICAL DATA:  Altered mental status.  Urinary tract infection EXAM: PORTABLE CHEST 1 VIEW COMPARISON:  None. FINDINGS: Normal mediastinum and cardiac silhouette. Normal pulmonary vasculature. No evidence of effusion, infiltrate, or pneumothorax. No acute bony abnormality. IMPRESSION: Normal chest radiograph. Electronically Signed   By: Suzy Bouchard M.D.   On: 05/25/2018 10:00     LOS: 4 days   Oren Binet, MD  [facility]  If 7PM-7AM, please contact night-coverage  Please page via [email or web address]  Go to [email or web address] and use [place]'s universal password to access. If you do not have the password, please contact the hospital operator.  Locate the [facility] provider you are looking for under [facility] and page to a number that you can be directly reached. If you still have difficulty reaching the provider, please page the [facility] (Director on Call) for the Hospitalists listed on amion for assistance.  05/29/2018, 11:27 AM

## 2018-05-30 DIAGNOSIS — F200 Paranoid schizophrenia: Secondary | ICD-10-CM

## 2018-05-30 LAB — COMPREHENSIVE METABOLIC PANEL
ALT: 26 U/L (ref 0–44)
AST: 21 U/L (ref 15–41)
Albumin: 3.2 g/dL — ABNORMAL LOW (ref 3.5–5.0)
Alkaline Phosphatase: 45 U/L (ref 38–126)
Anion gap: 9 (ref 5–15)
BUN: 11 mg/dL (ref 6–20)
CO2: 22 mmol/L (ref 22–32)
Calcium: 9.5 mg/dL (ref 8.9–10.3)
Chloride: 106 mmol/L (ref 98–111)
Creatinine, Ser: 0.74 mg/dL (ref 0.44–1.00)
GFR calc Af Amer: >60 mL/min (ref >60)
GFR calc non Af Amer: >60 mL/min (ref >60)
Glucose, Bld: 97 mg/dL (ref 70–99)
Potassium: 4 mmol/L (ref 3.5–5.1)
Sodium: 137 mmol/L (ref 135–145)
Total Bilirubin: 0.5 mg/dL (ref 0.3–1.2)
Total Protein: 6.6 g/dL (ref 6.5–8.1)

## 2018-05-30 LAB — CULTURE, BLOOD (ROUTINE X 2)
Culture: NO GROWTH
Culture: NO GROWTH
Special Requests: ADEQUATE

## 2018-05-30 LAB — CBC
HCT: 36.9 % (ref 36.0–46.0)
Hemoglobin: 12.1 g/dL (ref 12.0–15.0)
MCH: 31.4 pg (ref 26.0–34.0)
MCHC: 32.8 g/dL (ref 30.0–36.0)
MCV: 95.8 fL (ref 80.0–100.0)
Platelets: 372 10*3/uL (ref 150–400)
RBC: 3.85 MIL/uL — ABNORMAL LOW (ref 3.87–5.11)
RDW: 12.3 % (ref 11.5–15.5)
WBC: 6.1 10*3/uL (ref 4.0–10.5)
nRBC: 0 % (ref 0.0–0.2)

## 2018-05-30 LAB — GLUCOSE, CAPILLARY
Glucose-Capillary: 100 mg/dL — ABNORMAL HIGH (ref 70–99)
Glucose-Capillary: 173 mg/dL — ABNORMAL HIGH (ref 70–99)
Glucose-Capillary: 201 mg/dL — ABNORMAL HIGH (ref 70–99)
Glucose-Capillary: 282 mg/dL — ABNORMAL HIGH (ref 70–99)
Glucose-Capillary: 195 mg/dL — ABNORMAL HIGH (ref 70–99)
Glucose-Capillary: 78 mg/dL (ref 70–99)

## 2018-05-30 MED ORDER — SODIUM CHLORIDE 0.9 % IV BOLUS
500.0000 mL | Freq: Once | INTRAVENOUS | Status: AC
  Administered 2018-05-30: 500 mL via INTRAVENOUS

## 2018-05-30 MED ORDER — SODIUM CHLORIDE 0.9 % IV SOLN
INTRAVENOUS | Status: DC
  Administered 2018-05-30 – 2018-05-31 (×3): via INTRAVENOUS

## 2018-05-30 NOTE — Discharge Instructions (Signed)
Information on my medicine - XARELTO® (rivaroxaban) ° °WHY WAS XARELTO® PRESCRIBED FOR YOU? °Xarelto® was prescribed to treat blood clots that may have been found in the veins of your legs (deep vein thrombosis) or in your lungs (pulmonary embolism) and to reduce the risk of them occurring again. ° °What do you need to know about Xarelto®? °The starting dose is one 15 mg tablet taken TWICE daily with food for the FIRST 21 DAYS then the dose is changed to one 20 mg tablet taken ONCE A DAY with your evening meal. ° °DO NOT stop taking Xarelto® without talking to the health care provider who prescribed the medication.  Refill your prescription for 20 mg tablets before you run out. ° °After discharge, you should have regular check-up appointments with your healthcare provider that is prescribing your Xarelto®.  In the future your dose may need to be changed if your kidney function changes by a significant amount. ° °What do you do if you miss a dose? °If you are taking Xarelto® TWICE DAILY and you miss a dose, take it as soon as you remember. You may take two 15 mg tablets (total 30 mg) at the same time then resume your regularly scheduled 15 mg twice daily the next day. ° °If you are taking Xarelto® ONCE DAILY and you miss a dose, take it as soon as you remember on the same day then continue your regularly scheduled once daily regimen the next day. Do not take two doses of Xarelto® at the same time.  ° °Important Safety Information °Xarelto® is a blood thinner medicine that can cause bleeding. You should call your healthcare provider right away if you experience any of the following: °? Bleeding from an injury or your nose that does not stop. °? Unusual colored urine (red or dark brown) or unusual colored stools (red or black). °? Unusual bruising for unknown reasons. °? A serious fall or if you hit your head (even if there is no bleeding). ° °Some medicines may interact with Xarelto® and might increase your risk of  bleeding while on Xarelto®. To help avoid this, consult your healthcare provider or pharmacist prior to using any new prescription or non-prescription medications, including herbals, vitamins, non-steroidal anti-inflammatory drugs (NSAIDs) and supplements. ° °This website has more information on Xarelto®: www.xarelto.com. ° °

## 2018-05-30 NOTE — Progress Notes (Signed)
Physical Therapy Treatment Patient Details Name: Debbie Dorsey MRN: [id number] DOB: 28-Apr-1970 Today's Date: 05/30/2018    History of Present Illness Pt is a 48 y/o female admitted secondary to AMS. 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 MRI and EEG unremarkable. CT angio revealed PE and pt was started on heparin. PMH includes schizophrenia, DM, and PE.     PT Comments    Pt performed gait training with max VCs.  Pt agreeable to ambulate to retrieve snack from RN station.  BP pre session 11/74, post session 127/77. Pt able to follow commands inconsistently.  Plan for no PT follow up as she appears to be reaching baseline.  Plan for stair training next session then to d/c patient from caseload.      Follow Up Recommendations  No PT follow up     Equipment Recommendations  None recommended by PT    Recommendations for Other Services       Precautions / Restrictions Precautions Precautions: Fall Restrictions Weight Bearing Restrictions: No    Mobility  Bed Mobility Overal bed mobility: Modified Independent Bed Mobility: Rolling;Supine to Sit;Sit to Supine Rolling: Modified independent (Device/Increase time)   Supine to sit: Modified independent (Device/Increase time)     General bed mobility comments: Pt demonstrating generalized muscle weakness with increased time to complete due to behavior  Transfers Overall transfer level: Modified independent Equipment used: None             General transfer comment: performed x2 without assistance  Ambulation/Gait Ambulation/Gait assistance: Min guard Gait Distance (Feet): 12 Feet(+ 140 ft) Assistive device: None Gait Pattern/deviations: Step-through pattern;Drifts right/left;Staggering right Gait velocity: decreased   General Gait Details: Pt walked 6 ft and begin to urinate.  Pt returned to bed for clean up pulled knees to chest and placed socks and performed pericare.  After changing socks and  gown she performed gait training in halls.     Stairs             Wheelchair Mobility    Modified Rankin (Stroke Patients Only)       Balance Overall balance assessment: No apparent balance deficits (not formally assessed)                                          Cognition Arousal/Alertness: Awake/alert Behavior During Therapy: Agitated;Flat affect(not following commands)                                   General Comments: Pt not combative during session this pm. She remains to empty her bladder upon standing but unclear if this in behavioral or if she is truly incontinent      Exercises      General Comments        Pertinent Vitals/Pain Pain Assessment: Faces Faces Pain Scale: No hurt    Home Living                      Prior Function            PT Goals (current goals can now be found in the care plan section) Acute Rehab PT Goals Patient Stated Goal: "to eat and see her grandfather" Potential to Achieve Goals: Fair Progress towards PT goals: Progressing toward goals    Frequency  Min 3X/week      PT Plan Current plan remains appropriate    Co-evaluation              AM-PAC PT "6 Clicks" Mobility   Outcome Measure  Help needed turning from your back to your side while in a flat bed without using bedrails?: None Help needed moving from lying on your back to sitting on the side of a flat bed without using bedrails?: None Help needed moving to and from a bed to a chair (including a wheelchair)?: None Help needed standing up from a chair using your arms (e.g., wheelchair or bedside chair)?: None Help needed to walk in hospital room?: A Little Help needed climbing 3-5 steps with a railing? : A Little 6 Click Score: 22    End of Session   Activity Tolerance: Other (comment)(limited due to behavior/ cognition) Patient left: in bed;with call bell/phone within reach;with bed alarm set Nurse  Communication: Mobility status PT Visit Diagnosis: Other abnormalities of gait and mobility (R26.89);Difficulty in walking, not elsewhere classified (R26.2);Other symptoms and signs involving the nervous system (R29.898)     Time: 3428-7681 PT Time Calculation (min) (ACUTE ONLY): 33 min  Charges:  $Gait Training: 8-22 mins $Therapeutic Activity: 8-22 mins                     Governor Rooks, PTA Acute Rehabilitation Services Pager [phone] Office [phone]     Flynn Lininger Eli Hose 05/30/2018, 2:57 PM

## 2018-05-30 NOTE — Progress Notes (Signed)
Patient ID: Debbie Dorsey, female   DOB: May 13, 1970, 48 y.o.   MRN: [id number]  PROGRESS NOTE    VAISHALI BAISE  GUR:[id number] DOB: 15-Feb-1970 DOA: 05/25/2018 PCP: Prince Solian, MD   Brief Narrative:  48 year old female with history of schizophrenia, diabetes mellitus type 2, pulmonary embolism presented with altered mental status on 05/25/2018 which was thought to be secondary to toxic metabolic encephalopathy versus neuroleptic malignant syndrome.  Neurology and psychiatry were consulted.  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 -Neurology has signed off -Psychiatry had evaluated the patient and recommended to continue to hold antipsychotics but since her mental status is improving, will reconsult psychiatry today to see if he can restart some of her antipsychotics. -Monitor mental status: Still very slow to respond to questions but answers some of them.  Fall precautions. -PT/OT eval  Pulmonary embolism -Patient had PE in February but is noncompliant with Xarelto -Continue Xarelto for now.  She will need at least 6 months of uninterrupted anticoagulation.  Outpatient follow-up with PCP versus hematology before discontinuing anticoagulation  DM type II -Continue sliding scale coverage.  Blood sugars stable  Mildly suppressed TSH with normal free T4 -Could be mild subclinical hypothyroidism.  Repeat thyroid function in 3 months.  Asymptomatic bacteriuria -Urine culture  negative.  Doubt that patient has UTI.  Off antibiotics  History of paranoid schizophrenia -Plan as above.    DVT prophylaxis: Xarelto Code Status: Full Family Communication: None at bedside Disposition Plan: Home in the next 1 to 3 days once clinically improved and cleared by psychiatry  Consultants: Neurology/psychiatry  Procedures: EEG on 05/26/2018: Normal.  Antimicrobials:  Anti-infectives (From admission, onward)   Start     Dose/Rate Route Frequency Ordered Stop   05/26/18 1100  vancomycin (VANCOCIN) IVPB 750 mg/150 ml premix  Status:  Discontinued     750 mg 150 mL/hr over 60 Minutes Intravenous Every 24 hours 05/25/18 1024 05/25/18 1229   05/26/18 0830  cefTRIAXone (ROCEPHIN) 1 g in sodium chloride 0.9 % 100 mL IVPB     1 g 200 mL/hr over 30 Minutes Intravenous Every 24 hours 05/26/18 0748 05/28/18 1517   05/25/18 2300  ceFEPIme (MAXIPIME) 2 g in sodium chloride 0.9 % 100 mL IVPB  Status:  Discontinued     2 g 200 mL/hr over 30 Minutes Intravenous Every 12 hours 05/25/18 1022 05/25/18 1229   05/25/18 1015  ceFEPIme (MAXIPIME) 2 g in sodium chloride 0.9 % 100 mL IVPB     2 g 200 mL/hr over 30 Minutes Intravenous  Once 05/25/18 1010 05/25/18 1631   05/25/18 1015  metroNIDAZOLE (FLAGYL) IVPB 500 mg  Status:  Discontinued     500 mg 100 mL/hr over 60 Minutes Intravenous  Once 05/25/18 1010 05/25/18 1229   05/25/18 1015  vancomycin (VANCOCIN) IVPB 1000 mg/200 mL premix  Status:  Discontinued     1,000 mg 200 mL/hr over 60 Minutes Intravenous  Once 05/25/18 1010 05/25/18 1235        Subjective: Patient seen and examined at bedside.  She is awake, answers only a few questions.  Very slow to respond to questions.  No overnight fever or vomiting reported.  Objective: Vitals:   05/29/18 2111 05/30/18 0432 05/30/18 0522 05/30/18 0814  BP: (!) 89/68  100/69 (!) 88/66  Pulse: 84  77 91  Resp: 18  16   Temp:   97.8 F (36.6 C)   TempSrc:      SpO2:   100%   Weight:   43.1 kg    Height:       No intake or output data in the 24 hours ending 05/30/18 1002 Filed Weights   05/26/18 0514 05/28/18 0456 05/30/18 0432  Weight: 46.2 kg 44.3 kg 43.1 kg    Examination:  General exam: Appears calm and comfortable.  Awake but very slow to respond to questions and answers only very few questions.  No distress.   Respiratory system: Bilateral decreased breath sounds at bases Cardiovascular system: S1 & S2 heard, Rate controlled Gastrointestinal system: Abdomen is nondistended, soft and nontender. Normal bowel sounds heard. Extremities: No cyanosis, clubbing, edema    Data Reviewed: I have personally reviewed following labs and imaging studies  CBC: Recent Labs  Lab 05/25/18 0914 05/26/18 0335 05/27/18 0334 05/28/18 0256 05/29/18 0256 05/30/18 0302  WBC 11.2* 7.7 6.0 5.2 5.5 6.1  NEUTROABS 10.0*  --   --   --   --   --   HGB 12.4 10.6* 11.0* 10.3* 11.5* 12.1  HCT 38.9 31.9* 34.7* 32.5* 34.6* 36.9  MCV 100.5* 98.8 96.7 97.0 95.8 95.8  PLT 305 301 296 318 335 161   Basic Metabolic Panel: Recent Labs  Lab 05/26/18 0335 05/27/18 0334 05/28/18 0256 05/29/18 0256 05/30/18 0302  NA 136 136 139 136 137  K 3.6 4.1 3.7 4.1 4.0  CL 104 103 107 106 106  CO2 25 27 22  21* 22  GLUCOSE 168* 164* 169* 143* 97  BUN 5* 14 9 9 11   CREATININE 0.66 0.69 0.64 0.66 0.74  CALCIUM 8.9 9.0 9.2 9.3 9.5  MG  --  2.1 2.1  --   --    GFR: Estimated Creatinine Clearance: 59.8 mL/min (by C-G formula based on SCr of 0.74 mg/dL). Liver Function Tests: Recent Labs  Lab 05/26/18 0335 05/27/18 0334 05/28/18 0256 05/29/18 0256 05/30/18 0302  AST 55* 36 28 22 21   ALT 27 26 24 26 26   ALKPHOS 46 46 41 41 45  BILITOT 1.3* 0.9 0.4 0.3 0.5  PROT 5.9* 6.2* 5.7* 6.2* 6.6  ALBUMIN 2.9* 2.9* 2.7* 3.0* 3.2*   No results for input(s): LIPASE, AMYLASE in the last 168 hours. Recent Labs  Lab 05/26/18 0930  AMMONIA 36*   Coagulation Profile: Recent Labs  Lab 05/26/18  1137  INR 1.3*   Cardiac Enzymes: Recent Labs  Lab 05/25/18 1400 05/25/18 2001 05/27/18 0334  CKTOTAL 1,236* 996* 494*   BNP (last 3 results) No results for input(s): PROBNP in the last 8760 hours. HbA1C: No results for input(s): HGBA1C in the last 72 hours. CBG: Recent Labs  Lab 05/29/18 1625 05/29/18 2025 05/29/18 2335 05/30/18 0410 05/30/18 0759  GLUCAP 312* 219* 104* 100* 173*   Lipid Profile: No results for input(s): CHOL, HDL, LDLCALC, TRIG, CHOLHDL, LDLDIRECT in the last 72 hours. Thyroid Function Tests: No results for input(s): TSH, T4TOTAL, FREET4, T3FREE, THYROIDAB in the last 72 hours. Anemia Panel: No results for input(s): VITAMINB12, FOLATE, FERRITIN, TIBC, IRON, RETICCTPCT in the last 72 hours. Sepsis Labs: Recent  Labs  Lab 05/25/18 0914  LATICACIDVEN 1.6    Recent Results (from the past 240 hour(s))  Blood Culture (routine x 2)     Status: None (Preliminary result)   Collection Time: 05/25/18  9:10 AM  Result Value Ref Range Status   Specimen Description BLOOD LEFT ANTECUBITAL  Final   Special Requests   Final    BOTTLES DRAWN AEROBIC AND ANAEROBIC Blood Culture adequate volume   Culture   Final    NO GROWTH 4 DAYS Performed at [facility], [address], [place], [place] [address]    Report Status PENDING  Incomplete  Urine culture     Status: None   Collection Time: 05/25/18  9:17 AM  Result Value Ref Range Status   Specimen Description URINE, RANDOM  Final   Special Requests NONE  Final   Culture   Final    NO GROWTH Performed at [facility], [address] [place],  [address]    Report Status 05/26/2018 FINAL  Final  SARS Coronavirus 2 [facility] order, Performed in [place] hospital lab)     Status: None   Collection Time: 05/25/18  9:26 AM  Result Value Ref Range Status   SARS Coronavirus 2 NEGATIVE NEGATIVE Final    Comment: (NOTE) If result is NEGATIVE SARS-CoV-2 target nucleic acids are NOT DETECTED.  The SARS-CoV-2 RNA is generally detectable in upper and lower  respiratory specimens during the acute phase of infection. The lowest  concentration of SARS-CoV-2 viral copies this assay can detect is 250  copies / mL. A negative result does not preclude SARS-CoV-2 infection  and should not be used as the sole basis for treatment or other  patient management decisions.  A negative result may occur with  improper specimen collection / handling, submission of specimen other  than nasopharyngeal swab, presence of viral mutation(s) within the  areas targeted by this assay, and inadequate number of viral copies  (<250 copies / mL). A negative result must be combined with clinical  observations, patient history, and epidemiological information. If result is POSITIVE SARS-CoV-2 target nucleic acids are DETECTED. The SARS-CoV-2 RNA is generally detectable in upper and lower  respiratory specimens dur ing the acute phase of infection.  Positive  results are indicative of active infection with SARS-CoV-2.  Clinical  correlation with patient history and other diagnostic information is  necessary to determine patient infection status.  Positive results do  not rule out bacterial infection or co-infection with other viruses. If result is PRESUMPTIVE POSTIVE SARS-CoV-2 nucleic acids MAY BE PRESENT.   A presumptive positive result was obtained on the submitted specimen  and confirmed on repeat testing.  While 2019 novel coronavirus  (SARS-CoV-2) nucleic acids may be present in the submitted sample  additional confirmatory testing may be necessary for epidemiological  and / or clinical management purposes  to differentiate between  SARS-CoV-2 and other Sarbecovirus currently known to infect humans.  If clinically indicated additional testing with an alternate test  methodology [phone]) is advised. The SARS-CoV-2 RNA is generally  detectable in upper and lower respiratory sp ecimens during the acute   phase of infection. The expected result is Negative. Fact Sheet for Patients:  StrictlyIdeas.no Fact Sheet for Healthcare Providers: BankingDealers.co.za This test is not yet approved or cleared by the [place] FDA and has been authorized for detection and/or diagnosis of SARS-CoV-2 by FDA under an Emergency Use Authorization (EUA).  This EUA will remain in effect (  meaning this test can be used) for the duration of the COVID-19 declaration under Section 564(b)(1) of the Act, 21 U.S.C. section 360bbb-3(b)(1), unless the authorization is terminated or revoked sooner. Performed at [facility], [place] [address], [place], [place] [address]   Blood Culture (routine x 2)     Status: None (Preliminary result)   Collection Time: 05/25/18  2:29 PM  Result Value Ref Range Status   Specimen Description BLOOD SITE NOT SPECIFIED  Final   Special Requests   Final    BOTTLES DRAWN AEROBIC AND ANAEROBIC Blood Culture results may not be optimal due to an inadequate volume of blood received in culture bottles   Culture   Final    NO GROWTH 4 DAYS Performed at [facility], [place] [address] [place], [place] [address]    Report Status PENDING  Incomplete  Culture, Urine     Status: None   Collection Time: 05/26/18 12:05 PM  Result Value Ref Range Status   Specimen Description URINE, CLEAN CATCH  Final   Special Requests NONE  Final   Culture   Final    NO GROWTH Performed at [facility], [place] [address], [place], [place] [address]    Report Status 05/27/2018 FINAL  Final         Radiology Studies: No results found.      Scheduled Meds: . clonazePAM  0.5 mg Oral QHS  . feeding supplement (GLUCERNA SHAKE)  237 mL Oral TID BM  . feeding supplement (PRO-STAT SUGAR FREE 64)  30 mL Oral TID WC  . insulin aspart  0-15 Units Subcutaneous Q4H  . metoprolol tartrate  25 mg Oral BID  . Rivaroxaban  15 mg Oral BID    Continuous Infusions:   LOS: 5 days        Aline August, MD [facility] 05/30/2018, 10:02 AM
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
## 2018-05-31 LAB — GLUCOSE, CAPILLARY
Glucose-Capillary: 81 mg/dL (ref 70–99)
Glucose-Capillary: 90 mg/dL (ref 70–99)

## 2018-05-31 MED ORDER — RIVAROXABAN (XARELTO) VTE STARTER PACK (15 & 20 MG): ORAL_TABLET | ORAL | 0 refills | Status: DC

## 2018-05-31 MED ORDER — BENZTROPINE MESYLATE 0.5 MG PO TABS: 0.5000 mg | ORAL_TABLET | Freq: Two times a day (BID) | ORAL | Status: DC

## 2018-05-31 MED ORDER — RISPERIDONE 0.5 MG PO TABS: 0.5000 mg | ORAL_TABLET | Freq: Two times a day (BID) | ORAL | 0 refills | Status: DC

## 2018-05-31 NOTE — Progress Notes (Signed)
Discharge instructions reviewed with Jon Gills. Meds, diet, activity and follow up appointments reviewed and all questions answered. Meds sent to pharmacy.

## 2018-05-31 NOTE — Discharge Summary (Signed)
Physician Discharge Summary  CAOILAINN SACKS VVO:[id number] DOB: August 27, 1970 DOA: 05/25/2018  PCP: Prince Solian, MD  Admit date: 05/25/2018 Discharge date: 05/31/2018  Admitted From: Home Disposition: Home  Recommendations for Outpatient Follow-up:  1. Follow up with PCP in 1 week  2. Outpatient follow-up with psychiatry 3. Follow up in ED if symptoms worsen or new appear   Home Health: OT/RN Equipment/Devices: None  Discharge Condition: Stable CODE STATUS: Full Diet recommendation: Heart healthy/carb modified  Brief/Interim Summary: 48 year old female with history of schizophrenia, diabetes mellitus type 2, pulmonary embolism presented with altered mental status on 05/25/2018 which was thought to be secondary to toxic metabolic encephalopathy versus neuroleptic malignant syndrome.  Neurology and psychiatry were consulted.  Her antipsychotics were held.  Her condition gradually improved.  Neurology has signed off.  Psychiatry recommended to restart her home meds in a week at a lower dose and outpatient follow-up with psychiatry.  Patient is medically stable for discharge.   Discharge Diagnoses:  Principal Problem:   Altered mental status Active Problems:   Paranoid schizophrenia ([place])   Acute encephalopathy   Acute pulmonary embolism ([facility])   Type 2 diabetes mellitus ([facility])  Acute toxic metabolic encephalopathy with concern for neuroleptic malignant syndrome -Etiology thought to be either toxic metabolic encephalopathy or related to neuroleptic malignant syndrome secondary to antipsychotic medication use -MRI of brain without any acute changes -EEG negative for seizures -All antipsychotics on hold for now. -Neurology has signed off -Psychiatry reevaluated the patient and recommended to restart benztropine at 0.5 mg twice a day and Risperdal at 0.5 mg twice a day after a week.  Patient is to follow-up with psychiatry as an outpatient for further dose adjustments. -Mental  status is slightly improved.  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  Psychiatry has signed off.  OT recommends home health OT.  She will be discharged home with outpatient follow-up with PCP and psychiatry.   Pulmonary embolism -Patient had PE in February but is noncompliant with Xarelto -Continue Xarelto for now.  She will need at least 6 months of uninterrupted anticoagulation.  Outpatient follow-up with PCP versus hematology before discontinuing anticoagulation  DM type II -Blood sugars on the lower side.  Will hold insulin on discharge.  Outpatient follow-up with PCP   mildly suppressed TSH with normal free T4 -Could be mild subclinical hypothyroidism.  Repeat thyroid function in 3 months.  Asymptomatic bacteriuria -Urine culture negative.  Doubt that patient has UTI.  Off antibiotics  History of paranoid schizophrenia -Plan as above.  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   Diet - low sodium heart healthy   Complete by:  As directed    Diet Carb Modified   Complete by:  As directed    Increase activity slowly   Complete by:  As directed      Allergies as of 05/31/2018   No Known Allergies     Medication List    STOP taking these medications   busPIRone 5 MG tablet Commonly known as:  BUSPAR   HumaLOG Mix 75/25 KwikPen (75-25) 100 UNIT/ML Kwikpen Generic drug:  Insulin Lispro Prot & Lispro   naproxen 500 MG EC tablet Commonly known as:  EC NAPROSYN   nitrofurantoin (macrocrystal-monohydrate) 100 MG capsule Commonly known as:  MACROBID   Rivaroxaban 15 MG Tabs tablet Commonly known as:  XARELTO   rivaroxaban 20 MG Tabs tablet Commonly known as:  XARELTO Replaced by:  Rivaroxaban 15 & 20 MG Tbpk   traZODone 100 MG tablet Commonly known as:  DESYREL     TAKE these  medications   benztropine 0.5 MG tablet Commonly known as:  COGENTIN Take 1 tablet (0.5 mg total) by mouth 2 (two) times daily. Restart on 06/07/2018 as per psychiatry Start taking on:  Jun 07, 2018 What changed:    additional instructions  These instructions start on Jun 07, 2018. If you are unsure what to do until then, ask your doctor or other care provider.  Another medication with the same name was removed. Continue taking this medication, and follow the directions you see here.   Multivital Chew Chew 2 each by mouth daily.   risperiDONE 0.5 MG tablet Commonly known as:  RisperDAL Take 1 tablet (0.5 mg total) by mouth 2 (two) times daily. Start on 06/07/2018 as per psychiatry Start taking on:  Jun 07, 2018 What changed:    medication strength  how much to take  when to take this  additional instructions  These instructions start on Jun 07, 2018. If you are unsure what to do until then, ask your doctor or other care provider.  Another medication with the same name was removed. Continue taking this medication, and follow the directions you see here.   Rivaroxaban 15 & 20 MG Tbpk Take as directed on package: Start with one 15mg  tablet by mouth twice a day with food. On Day 22, switch to one 20mg  tablet once a day with food. Replaces:  rivaroxaban 20 MG Tabs tablet   simvastatin 40 MG tablet Commonly known as:  ZOCOR Take 40 mg by mouth daily at 12 noon.   temazepam 15 MG capsule Commonly known as:  RESTORIL Take 1 capsule (15 mg total) by mouth at bedtime as needed for sleep.      Follow-up Information    Avva, Ravisankar, MD. Schedule an appointment as soon as possible for a visit in 1 week(s).   Specialty:  Internal Medicine Contact information: [address] [place] [address] [phone]        Psychiatrist Follow up today.   Why:  At earliest convenience         No Known  Allergies  Consultations:  Psychiatry/neurology   Procedures/Studies: Ct Head Wo Contrast  Result Date: 05/25/2018 CLINICAL DATA:  Altered mental status. Currently being treated for urinary tract infection. History of schizophrenia. EXAM: CT HEAD WITHOUT CONTRAST TECHNIQUE: Contiguous axial images were obtained from the base of the skull through the vertex without intravenous contrast. COMPARISON:  None. FINDINGS: Brain: There is no evidence of acute infarct, intracranial hemorrhage, mass, midline shift, or extra-axial fluid collection. The ventricles and sulci are normal. Vascular: No hyperdense vessel. Skull: No fracture or focal osseous lesion. Sinuses/Orbits: Visualized paranasal sinuses and mastoid air cells are clear. Visualized orbits are unremarkable. Other: None. IMPRESSION: Negative head CT. Electronically Signed   By: Logan Bores M.D.   On: 05/25/2018 10:15   Ct Angio Chest Pe W Or Wo Contrast  Result Date: 05/25/2018 CLINICAL DATA:  Diabetes.  Shortness of breath. EXAM: CT ANGIOGRAPHY CHEST WITH CONTRAST TECHNIQUE: Multidetector CT imaging of the chest was performed using the standard protocol during bolus administration of intravenous contrast. Multiplanar CT image reconstructions and MIPs were obtained to evaluate the vascular anatomy. CONTRAST:  15mL OMNIPAQUE IOHEXOL 350 MG/ML SOLN COMPARISON:  04/06/2018 FINDINGS: Cardiovascular: Satisfactory opacification  of the pulmonary arteries to the segmental level. Right lower lobe segmental and subsegmental pulmonary emboli. No right heart strain. Normal heart size. No pericardial effusion. Mediastinum/Nodes: No enlarged mediastinal, hilar, or axillary lymph nodes. Thyroid gland, trachea, and esophagus demonstrate no significant findings. Lungs/Pleura: Lungs are clear. No pleural effusion or pneumothorax. Upper Abdomen: No acute abnormality. Musculoskeletal: No chest wall abnormality. No acute or significant osseous findings. Review of the  MIP images confirms the above findings. IMPRESSION: 1. Acute right lower lobe segmental and subsegmental pulmonary emboli. Critical Value/emergent results were called by telephone at the time of interpretation on 05/25/2018 at 1:09 pm to Dr. Davonna Belling , who verbally acknowledged these results. Electronically Signed   By: Kathreen Devoid   On: 05/25/2018 13:10   Mr Brain Wo Contrast  Result Date: 05/27/2018 CLINICAL DATA:  48 year old female with altered mental status and low-grade fever. Right lower lobe subsegmental pulmonary embolus. EXAM: MRI HEAD WITHOUT CONTRAST TECHNIQUE: Multiplanar, multiecho pulse sequences of the brain and surrounding structures were obtained without intravenous contrast. COMPARISON:  Head CT 05/25/2018. FINDINGS: Brain: No restricted diffusion to suggest acute infarction. No midline shift, mass effect, evidence of mass lesion, ventriculomegaly, extra-axial collection or acute intracranial hemorrhage. Cervicomedullary junction and pituitary are within normal limits. Scattered mild to moderate for age nonspecific cerebral white matter T2 and FLAIR hyperintense foci, mostly subcortical. No cortical encephalomalacia. No chronic cerebral blood products. Deep gray matter nuclei, brainstem and cerebellum are within normal limits. Vascular: Major intracranial vascular flow voids are preserved. Skull and upper cervical spine: Negative visible cervical spine. Visualized bone marrow signal is within normal limits. Sinuses/Orbits: Negative. Other: Mastoids are clear. Scalp and face soft tissues appear negative. IMPRESSION: No acute intracranial abnormality. Mild to moderate for age scattered nonspecific cerebral white matter signal changes, most commonly due to small vessel disease. Electronically Signed   By: Genevie Ann M.D.   On: 05/27/2018 09:32   Dg Chest Port 1 View  Result Date: 05/25/2018 CLINICAL DATA:  Altered mental status.  Urinary tract infection EXAM: PORTABLE CHEST 1 VIEW  COMPARISON:  None. FINDINGS: Normal mediastinum and cardiac silhouette. Normal pulmonary vasculature. No evidence of effusion, infiltrate, or pneumothorax. No acute bony abnormality. IMPRESSION: Normal chest radiograph. Electronically Signed   By: Suzy Bouchard M.D.   On: 05/25/2018 10:00     EEG on 05/26/2018: Normal  Subjective: Patient seen and examined at bedside.  She wakes up on calling her name and answers only very little questions.  No overnight agitation, fever or vomiting reported by nursing staff.  Discharge Exam: Vitals:   05/30/18 2054 05/31/18 0637  BP: (!) 86/58 103/73  Pulse: 81 75  Resp: 16 16  Temp: 98.3 F (36.8 C) 99 F (37.2 C)  SpO2: 99% 97%    General: Pt is sleepy, wakes up on calling her name.  Answers only very Minimal questions Cardiovascular: rate controlled, S1/S2 + Respiratory: bilateral decreased breath sounds at bases Abdominal: Soft, NT, ND, bowel sounds + Extremities: no edema, no cyanosis    The results of significant diagnostics from this hospitalization (including imaging, microbiology, ancillary and laboratory) are listed below for reference.     Microbiology: Recent Results (from the past 240 hour(s))  Blood Culture (routine x 2)     Status: None   Collection Time: 05/25/18  9:10 AM  Result Value Ref Range Status   Specimen Description BLOOD LEFT ANTECUBITAL  Final   Special Requests   Final    BOTTLES DRAWN AEROBIC  AND ANAEROBIC Blood Culture adequate volume   Culture   Final    NO GROWTH 5 DAYS Performed at [facility], [place] [address] [place], [place] [address]    Report Status 05/30/2018 FINAL  Final  Urine culture     Status: None   Collection Time: 05/25/18  9:17 AM  Result Value Ref Range Status   Specimen Description URINE, RANDOM  Final   Special Requests NONE  Final   Culture   Final    NO GROWTH Performed at [facility], [place] [address] [place], [place] [address]    Report Status 05/26/2018 FINAL   Final  SARS Coronavirus 2 [facility] order, Performed in [place] hospital lab)     Status: None   Collection Time: 05/25/18  9:26 AM  Result Value Ref Range Status   SARS Coronavirus 2 NEGATIVE NEGATIVE Final    Comment: (NOTE) If result is NEGATIVE SARS-CoV-2 target nucleic acids are NOT DETECTED. The SARS-CoV-2 RNA is generally detectable in upper and lower  respiratory specimens during the acute phase of infection. The lowest  concentration of SARS-CoV-2 viral copies this assay can detect is 250  copies / mL. A negative result does not preclude SARS-CoV-2 infection  and should not be used as the sole basis for treatment or other  patient management decisions.  A negative result may occur with  improper specimen collection / handling, submission of specimen other  than nasopharyngeal swab, presence of viral mutation(s) within the  areas targeted by this assay, and inadequate number of viral copies  (<250 copies / mL). A negative result must be combined with clinical  observations, patient history, and epidemiological information. If result is POSITIVE SARS-CoV-2 target nucleic acids are DETECTED. The SARS-CoV-2 RNA is generally detectable in upper and lower  respiratory specimens dur ing the acute phase of infection.  Positive  results are indicative of active infection with SARS-CoV-2.  Clinical  correlation with patient history and other diagnostic information is  necessary to determine patient infection status.  Positive results do  not rule out bacterial infection or co-infection with other viruses. If result is PRESUMPTIVE POSTIVE SARS-CoV-2 nucleic acids MAY BE PRESENT.   A presumptive positive result was obtained on the submitted specimen  and confirmed on repeat testing.  While 2019 novel coronavirus  (SARS-CoV-2) nucleic acids may be present in the submitted sample  additional confirmatory testing may be necessary for epidemiological  and / or clinical management  purposes  to differentiate between  SARS-CoV-2 and other Sarbecovirus currently known to infect humans.  If clinically indicated additional testing with an alternate test  methodology [phone]) is advised. The SARS-CoV-2 RNA is generally  detectable in upper and lower respiratory sp ecimens during the acute  phase of infection. The expected result is Negative. Fact Sheet for Patients:  StrictlyIdeas.no Fact Sheet for Healthcare Providers: BankingDealers.co.za This test is not yet approved or cleared by the [place] FDA and has been authorized for detection and/or diagnosis of SARS-CoV-2 by FDA under an Emergency Use Authorization (EUA).  This EUA will remain in effect (meaning this test can be used) for the duration of the COVID-19 declaration under Section 564(b)(1) of the Act, 21 U.S.C. section 360bbb-3(b)(1), unless the authorization is terminated or revoked sooner. Performed at [facility], [place] [address], [place], [place] [address]   Blood Culture (routine x 2)     Status: None   Collection Time: 05/25/18  2:29 PM  Result  Value Ref Range Status   Specimen Description BLOOD SITE NOT SPECIFIED  Final   Special Requests   Final    BOTTLES DRAWN AEROBIC AND ANAEROBIC Blood Culture results may not be optimal due to an inadequate volume of blood received in culture bottles   Culture   Final    NO GROWTH 5 DAYS Performed at [facility], [place] [address] [place], [place] [address]    Report Status 05/30/2018 FINAL  Final  Culture, Urine     Status: None   Collection Time: 05/26/18 12:05 PM  Result Value Ref Range Status   Specimen Description URINE, CLEAN CATCH  Final   Special Requests NONE  Final   Culture   Final    NO GROWTH Performed at [facility], [place] [address], [place], [place] [address]    Report Status 05/27/2018 FINAL  Final     Labs: BNP (last 3 results) No results for input(s): BNP in the  last 8760 hours. Basic Metabolic Panel: Recent Labs  Lab 05/26/18 0335 05/27/18 0334 05/28/18 0256 05/29/18 0256 05/30/18 0302  NA 136 136 139 136 137  K 3.6 4.1 3.7 4.1 4.0  CL 104 103 107 106 106  CO2 25 27 22  21* 22  GLUCOSE 168* 164* 169* 143* 97  BUN 5* 14 9 9 11   CREATININE 0.66 0.69 0.64 0.66 0.74  CALCIUM 8.9 9.0 9.2 9.3 9.5  MG  --  2.1 2.1  --   --    Liver Function Tests: Recent Labs  Lab 05/26/18 0335 05/27/18 0334 05/28/18 0256 05/29/18 0256 05/30/18 0302  AST 55* 36 28 22 21   ALT 27 26 24 26 26   ALKPHOS 46 46 41 41 45  BILITOT 1.3* 0.9 0.4 0.3 0.5  PROT 5.9* 6.2* 5.7* 6.2* 6.6  ALBUMIN 2.9* 2.9* 2.7* 3.0* 3.2*   No results for input(s): LIPASE, AMYLASE in the last 168 hours. Recent Labs  Lab 05/26/18 0930  AMMONIA 36*   CBC: Recent Labs  Lab 05/25/18 0914 05/26/18 0335 05/27/18 0334 05/28/18 0256 05/29/18 0256 05/30/18 0302  WBC 11.2* 7.7 6.0 5.2 5.5 6.1  NEUTROABS 10.0*  --   --   --   --   --   HGB 12.4 10.6* 11.0* 10.3* 11.5* 12.1  HCT 38.9 31.9* 34.7* 32.5* 34.6* 36.9  MCV 100.5* 98.8 96.7 97.0 95.8 95.8  PLT 305 301 296 318 335 372   Cardiac Enzymes: Recent Labs  Lab 05/25/18 1400 05/25/18 2001 05/27/18 0334  CKTOTAL 1,236* 996* 494*   BNP: Invalid input(s): POCBNP CBG: Recent Labs  Lab 05/30/18 1616 05/30/18 2002 05/30/18 2315 05/31/18 0506 05/31/18 0808  GLUCAP 282* 195* 78 81 90   D-Dimer No results for input(s): DDIMER in the last 72 hours. Hgb A1c No results for input(s): HGBA1C in the last 72 hours. Lipid Profile No results for input(s): CHOL, HDL, LDLCALC, TRIG, CHOLHDL, LDLDIRECT in the last 72 hours. Thyroid function studies No results for input(s): TSH, T4TOTAL, T3FREE, THYROIDAB in the last 72 hours.  Invalid input(s): FREET3 Anemia work up No results for input(s): VITAMINB12, FOLATE, FERRITIN, TIBC, IRON, RETICCTPCT in the last 72 hours. Urinalysis    Component Value Date/Time   COLORURINE YELLOW  05/25/2018 0914   APPEARANCEUR HAZY (A) 05/25/2018 0914   LABSPEC 1.027 05/25/2018 0914   PHURINE 5.0 05/25/2018 0914   GLUCOSEU 50 (A) 05/25/2018 0914   HGBUR NEGATIVE 05/25/2018 0914   BILIRUBINUR NEGATIVE 05/25/2018 0914  KETONESUR 5 (A) 05/25/2018 0914   PROTEINUR 30 (A) 05/25/2018 0914   NITRITE NEGATIVE 05/25/2018 0914   LEUKOCYTESUR TRACE (A) 05/25/2018 0914   Sepsis Labs Invalid input(s): PROCALCITONIN,  WBC,  LACTICIDVEN Microbiology Recent Results (from the past 240 hour(s))  Blood Culture (routine x 2)     Status: None   Collection Time: 05/25/18  9:10 AM  Result Value Ref Range Status   Specimen Description BLOOD LEFT ANTECUBITAL  Final   Special Requests   Final    BOTTLES DRAWN AEROBIC AND ANAEROBIC Blood Culture adequate volume   Culture   Final    NO GROWTH 5 DAYS Performed at [facility], [address], [place], [place] [address]    Report Status 05/30/2018 FINAL  Final  Urine culture     Status: None   Collection Time: 05/25/18  9:17 AM  Result Value Ref Range Status   Specimen Description URINE, RANDOM  Final   Special Requests NONE  Final   Culture   Final    NO GROWTH Performed at [facility], [place] [address] [place], [place] [address]    Report Status 05/26/2018 FINAL  Final  SARS Coronavirus 2 [facility] order, Performed in [place] hospital lab)     Status: None   Collection Time: 05/25/18  9:26 AM  Result Value Ref Range Status   SARS Coronavirus 2 NEGATIVE NEGATIVE Final    Comment: (NOTE) If result is NEGATIVE SARS-CoV-2 target nucleic acids are NOT DETECTED. The SARS-CoV-2 RNA is generally detectable in upper and lower  respiratory specimens during the acute phase of infection. The lowest  concentration of SARS-CoV-2 viral copies this assay can detect is 250  copies / mL. A negative result does not preclude SARS-CoV-2 infection  and should not be used as the sole basis for treatment or other  patient management decisions.   A negative result may occur with  improper specimen collection / handling, submission of specimen other  than nasopharyngeal swab, presence of viral mutation(s) within the  areas targeted by this assay, and inadequate number of viral copies  (<250 copies / mL). A negative result must be combined with clinical  observations, patient history, and epidemiological information. If result is POSITIVE SARS-CoV-2 target nucleic acids are DETECTED. The SARS-CoV-2 RNA is generally detectable in upper and lower  respiratory specimens dur ing the acute phase of infection.  Positive  results are indicative of active infection with SARS-CoV-2.  Clinical  correlation with patient history and other diagnostic information is  necessary to determine patient infection status.  Positive results do  not rule out bacterial infection or co-infection with other viruses. If result is PRESUMPTIVE POSTIVE SARS-CoV-2 nucleic acids MAY BE PRESENT.   A presumptive positive result was obtained on the submitted specimen  and confirmed on repeat testing.  While 2019 novel coronavirus  (SARS-CoV-2) nucleic acids may be present in the submitted sample  additional confirmatory testing may be necessary for epidemiological  and / or clinical management purposes  to differentiate between  SARS-CoV-2 and other Sarbecovirus currently known to infect humans.  If clinically indicated additional testing with an alternate test  methodology [phone]) is advised. The SARS-CoV-2 RNA is generally  detectable in upper and lower respiratory sp ecimens during the acute  phase of infection. The expected result is Negative. Fact Sheet for Patients:  StrictlyIdeas.no Fact Sheet for Healthcare Providers: BankingDealers.co.za This test is not yet approved or cleared by the [place] FDA and  has been authorized for detection and/or diagnosis of SARS-CoV-2 by FDA under an Emergency Use  Authorization (EUA).  This EUA will remain in effect (meaning this test can be used) for the duration of the COVID-19 declaration under Section 564(b)(1) of the Act, 21 U.S.C. section 360bbb-3(b)(1), unless the authorization is terminated or revoked sooner. Performed at [facility], [place] [address], [place], [place] [address]   Blood Culture (routine x 2)     Status: None   Collection Time: 05/25/18  2:29 PM  Result Value Ref Range Status   Specimen Description BLOOD SITE NOT SPECIFIED  Final   Special Requests   Final    BOTTLES DRAWN AEROBIC AND ANAEROBIC Blood Culture results may not be optimal due to an inadequate volume of blood received in culture bottles   Culture   Final    NO GROWTH 5 DAYS Performed at [facility], [place] [address], [place], [place] [address]    Report Status 05/30/2018 FINAL  Final  Culture, Urine     Status: None   Collection Time: 05/26/18 12:05 PM  Result Value Ref Range Status   Specimen Description URINE, CLEAN CATCH  Final   Special Requests NONE  Final   Culture   Final    NO GROWTH Performed at [facility], [place] [address], [place], [place] [address]    Report Status 05/27/2018 FINAL  Final     Time coordinating discharge: 35 minutes  SIGNED:   Aline August, MD  [facility] 05/31/2018, 8:47 AM

## 2018-05-31 NOTE — TOC Transition Note (Addendum)
Transition of Care [facility]) - CM/SW Discharge Note   Patient Details  Name: Debbie Dorsey MRN: [id number] Date of Birth: October 05, 1970  Transition of Care [facility]) CM/SW Contact:  Maryclare Labrador, RN Phone Number: 05/31/2018, 9:28 AM   Clinical Narrative:   Pt to discharge home today in care of grandfather - grandfather will provide 24 hour supervision.  CM spoke with grandfather due to flucuating orientation of pt.  Grandfather informed CM that pt is independent with mobility however does have an aide in the home 7 days a week for approximately 3 hours a day to help with ADLs  - attending informed and order for [facility] modified to delete [place] OT .  CM explained the recommendation of HHRN to grandfather - his is in agreement.  CM verbalized medicare.gov HH list - grandfather chose [facility] - agency contacted and referral under review.  Grandfather will take pt home in private vehicle.  Grandfather confirms that pt was on Xarelto PTA - he denied barriers with paying for medications.  CM attempted to make follow up appt with PCP - however office informed that the policy is for pt to make the appt after discharge - grandfather to make.    Ambulatory referral made by attending to outpt psych.  Update:  [place] decline pt.  2nd choice was [facility] - agency accepted referral  Final next level of care: [place] Barriers to Discharge: Barriers Resolved   Patient Goals and CMS Choice Patient states their goals for this hospitalization and ongoing recovery are:: (Per nursing staff pt remains confused - however close to baseline) CMS Medicare.gov Compare Post Acute Care list provided to:: Other (Comment Required)(Care-giver grandfather) Choice offered to / list presented to : Parent  Discharge Placement                       Discharge Plan and Services   Discharge Planning Services: CM Consult                      HH Arranged: RN [facility] Agency: [place] ([place]) Date [facility]  Agency Contacted: 05/31/18 Time [place]: 0920 Representative spoke with at [place]: Havery [name]  Social Determinants of Health (SDOH) Interventions     Readmission Risk Interventions No flowsheet data found.

## 2018-05-31 NOTE — Progress Notes (Signed)
Patient discharged home via wheelchair with Grandfather.

## 2018-07-16 ENCOUNTER — Emergency Department (HOSPITAL_COMMUNITY)
Admission: EM | Admit: 2018-07-16 | Discharge: 2018-07-17 | Disposition: A | Discharge status: Home / Self Care | Payer: Medicare Other | Attending: Emergency Medicine | Admitting: Emergency Medicine

## 2018-07-16 ENCOUNTER — Other Ambulatory Visit: Payer: Self-pay

## 2018-07-16 ENCOUNTER — Encounter (HOSPITAL_COMMUNITY): Payer: Self-pay

## 2018-07-16 ENCOUNTER — Emergency Department (HOSPITAL_COMMUNITY): Payer: Medicare Other

## 2018-07-16 DIAGNOSIS — Z79899 Other long term (current) drug therapy: Secondary | ICD-10-CM | POA: Insufficient documentation

## 2018-07-16 DIAGNOSIS — K5900 Constipation, unspecified: Secondary | ICD-10-CM | POA: Insufficient documentation

## 2018-07-16 DIAGNOSIS — F200 Paranoid schizophrenia: Secondary | ICD-10-CM | POA: Diagnosis not present

## 2018-07-16 DIAGNOSIS — R4182 Altered mental status, unspecified: Secondary | ICD-10-CM

## 2018-07-16 DIAGNOSIS — E119 Type 2 diabetes mellitus without complications: Secondary | ICD-10-CM | POA: Diagnosis not present

## 2018-07-16 DIAGNOSIS — F29 Unspecified psychosis not due to a substance or known physiological condition: Secondary | ICD-10-CM | POA: Diagnosis present

## 2018-07-16 LAB — ACETAMINOPHEN LEVEL: Acetaminophen (Tylenol), Serum: <10 ug/mL — ABNORMAL LOW (ref 10–30)

## 2018-07-16 LAB — COMPREHENSIVE METABOLIC PANEL
ALT: 37 U/L (ref 0–44)
AST: 34 U/L (ref 15–41)
Albumin: 4.4 g/dL (ref 3.5–5.0)
Alkaline Phosphatase: 59 U/L (ref 38–126)
Anion gap: 11 (ref 5–15)
BUN: 9 mg/dL (ref 6–20)
CO2: 22 mmol/L (ref 22–32)
Calcium: 10 mg/dL (ref 8.9–10.3)
Chloride: 103 mmol/L (ref 98–111)
Creatinine, Ser: 0.69 mg/dL (ref 0.44–1.00)
GFR calc Af Amer: >60 mL/min (ref >60)
GFR calc non Af Amer: >60 mL/min (ref >60)
Glucose, Bld: 173 mg/dL — ABNORMAL HIGH (ref 70–99)
Potassium: 4.8 mmol/L (ref 3.5–5.1)
Sodium: 136 mmol/L (ref 135–145)
Total Bilirubin: 0.6 mg/dL (ref 0.3–1.2)
Total Protein: 7.7 g/dL (ref 6.5–8.1)

## 2018-07-16 LAB — CBC WITH DIFFERENTIAL/PLATELET
Abs Immature Granulocytes: 0.01 10*3/uL (ref 0.00–0.07)
Basophils Absolute: 0 10*3/uL (ref 0.0–0.1)
Basophils Relative: 1 %
Eosinophils Absolute: 0 10*3/uL (ref 0.0–0.5)
Eosinophils Relative: 0 %
HCT: 41 % (ref 36.0–46.0)
Hemoglobin: 13.5 g/dL (ref 12.0–15.0)
Immature Granulocytes: 0 %
Lymphocytes Relative: 35 %
Lymphs Abs: 1.5 10*3/uL (ref 0.7–4.0)
MCH: 32.1 pg (ref 26.0–34.0)
MCHC: 32.9 g/dL (ref 30.0–36.0)
MCV: 97.6 fL (ref 80.0–100.0)
Monocytes Absolute: 0.2 10*3/uL (ref 0.1–1.0)
Monocytes Relative: 6 %
Neutro Abs: 2.6 10*3/uL (ref 1.7–7.7)
Neutrophils Relative %: 58 %
Platelets: 308 10*3/uL (ref 150–400)
RBC: 4.2 MIL/uL (ref 3.87–5.11)
RDW: 13.3 % (ref 11.5–15.5)
WBC: 4.4 10*3/uL (ref 4.0–10.5)
nRBC: 0 % (ref 0.0–0.2)

## 2018-07-16 LAB — ETHANOL: Alcohol, Ethyl (B): <10 mg/dL (ref <10)

## 2018-07-16 LAB — I-STAT BETA HCG BLOOD, ED (MC, WL, AP ONLY): I-stat hCG, quantitative: <5 m[IU]/mL (ref <5)

## 2018-07-16 LAB — SALICYLATE LEVEL: Salicylate Lvl: <7 mg/dL (ref 2.8–30.0)

## 2018-07-16 MED ORDER — TEMAZEPAM 15 MG PO CAPS: 15.0000 mg | ORAL_CAPSULE | Freq: Every evening | ORAL | Status: DC | PRN

## 2018-07-16 MED ORDER — SODIUM CHLORIDE 0.9 % IV BOLUS
1000.0000 mL | Freq: Once | INTRAVENOUS | Status: AC
  Administered 2018-07-16: 1000 mL via INTRAVENOUS

## 2018-07-16 MED ORDER — ADULT MULTIVITAMIN W/MINERALS CH
1.0000 | ORAL_TABLET | Freq: Every day | ORAL | Status: DC
  Administered 2018-07-16 – 2018-07-17 (×2): 1 via ORAL
  Filled 2018-07-16 (×2): qty 1

## 2018-07-16 MED ORDER — RISPERIDONE 0.5 MG PO TABS
0.5000 mg | ORAL_TABLET | Freq: Two times a day (BID) | ORAL | Status: DC
  Administered 2018-07-16 – 2018-07-17 (×3): 0.5 mg via ORAL
  Filled 2018-07-16 (×3): qty 1

## 2018-07-16 MED ORDER — MAGNESIUM CITRATE PO SOLN
1.0000 | Freq: Once | ORAL | Status: DC
  Filled 2018-07-16: qty 296

## 2018-07-16 MED ORDER — BENZTROPINE MESYLATE 0.5 MG PO TABS
0.5000 mg | ORAL_TABLET | Freq: Two times a day (BID) | ORAL | Status: DC
  Administered 2018-07-16 – 2018-07-17 (×3): 0.5 mg via ORAL
  Filled 2018-07-16 (×3): qty 1

## 2018-07-16 MED ORDER — SIMVASTATIN 40 MG PO TABS
40.0000 mg | ORAL_TABLET | Freq: Every day | ORAL | Status: DC
  Filled 2018-07-16: qty 1

## 2018-07-16 NOTE — ED Notes (Signed)
TTS on mobile TTS machine in room at this time
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
## 2018-07-16 NOTE — ED Provider Notes (Signed)
[facility] DEPT Provider Note   CSN: [id number] Arrival date & time: 07/16/18  1318    History   Chief Complaint Chief Complaint  Patient presents with  . Altered Mental Status    HPI Debbie Dorsey is a 48 y.o. female.     Pt presents to the ED today with AMS.  The history is given by the EMS.  The pt has a hx of schizophrenia and has not been acting right.  I tried to call her GF who is her legal guardian to get additional hx, but could not reach him.  She is not able to give any hx.     Past Medical History:  Diagnosis Date  . Diabetes mellitus without complication [facility])     Patient Active Problem List   Diagnosis Date Noted  . Altered mental status   . Acute encephalopathy 05/25/2018  . Acute pulmonary embolism ([place]) 05/25/2018  . Type 2 diabetes mellitus ([place]) 05/25/2018  . Schizophrenia ([place]) 04/08/2018  . Paranoid schizophrenia ([place]) 04/02/2018    History reviewed. No pertinent surgical history.   OB History    Gravida  1   Para      Term      Preterm      AB      Living        SAB      TAB      Ectopic      Multiple      Live Births               Home Medications    Prior to Admission medications   Medication Sig Start Date End Date Taking? Authorizing Provider  benztropine (COGENTIN) 0.5 MG tablet Take 1 tablet (0.5 mg total) by mouth 2 (two) times daily. Restart on 06/07/2018 as per psychiatry 06/07/18  Yes Aline August, MD  Multiple Vitamins-Minerals (MULTIVITAL) CHEW Chew 2 each by mouth daily.   Yes [provider]  risperiDONE (RISPERDAL) 0.5 MG tablet Take 1 tablet (0.5 mg total) by mouth 2 (two) times daily. Start on 06/07/2018 as per psychiatry 06/07/18  Yes Aline August, MD  Rivaroxaban 15 & 20 MG TBPK Take as directed on package: Start with one 15mg  tablet by mouth twice a day with food. On Day 22, switch to one 20mg  tablet once a day with food. 05/31/18  Yes Aline August, MD   simvastatin (ZOCOR) 40 MG tablet Take 40 mg by mouth daily at 12 noon.    Yes [provider]  temazepam (RESTORIL) 15 MG capsule Take 1 capsule (15 mg total) by mouth at bedtime as needed for sleep. 04/09/18  Yes Johnn Hai, MD    Family History Family History  Problem Relation Age of Onset  . Mental illness Sister     Social History Social History   Tobacco Use  . Smoking status: Never Smoker  . Smokeless tobacco: Never Used  Substance Use Topics  . Alcohol use: No  . Drug use: No     Allergies   Patient has no known allergies.   Review of Systems Review of Systems  Unable to perform ROS: Psychiatric disorder     Physical Exam Updated Vital Signs BP (!) 141/89 (BP Location: Left Arm)   Pulse 95   Temp 98.7 F (37.1 C) (Oral)   Resp 18   SpO2 100%   Physical Exam Vitals signs and nursing note reviewed.  Constitutional:      Appearance: Normal  appearance.  HENT:     Head: Normocephalic and atraumatic.     Right Ear: External ear normal.     Left Ear: External ear normal.     Nose: Nose normal.     Mouth/Throat:     Mouth: Mucous membranes are dry.  Eyes:     Extraocular Movements: Extraocular movements intact.     Conjunctiva/sclera: Conjunctivae normal.     Pupils: Pupils are equal, round, and reactive to light.  Neck:     Musculoskeletal: Normal range of motion and neck supple.  Cardiovascular:     Rate and Rhythm: Normal rate and regular rhythm.     Pulses: Normal pulses.     Heart sounds: Normal heart sounds.  Pulmonary:     Effort: Pulmonary effort is normal.     Breath sounds: Normal breath sounds.  Abdominal:     General: Abdomen is flat. Bowel sounds are normal.     Palpations: Abdomen is soft.  Musculoskeletal: Normal range of motion.  Skin:    General: Skin is warm and dry.     Capillary Refill: Capillary refill takes less than 2 seconds.  Neurological:     General: No focal deficit present.     Mental Status: She is alert.  She is disoriented.  Psychiatric:        Mood and Affect: Affect is blunt.        Speech: She is noncommunicative.      ED Treatments / Results  Labs (all labs ordered are listed, but only abnormal results are displayed) Labs Reviewed  COMPREHENSIVE METABOLIC PANEL - Abnormal; Notable for the following components:      Result Value   Glucose, Bld 173 (*)    All other components within normal limits  ACETAMINOPHEN LEVEL - Abnormal; Notable for the following components:   Acetaminophen (Tylenol), Serum <10 (*)    All other components within normal limits  SARS CORONAVIRUS 2 (HOSPITAL ORDER, [place] [facility])  ETHANOL  CBC WITH DIFFERENTIAL/PLATELET  SALICYLATE LEVEL  RAPID URINE DRUG SCREEN, HOSP PERFORMED  URINALYSIS, ROUTINE W REFLEX MICROSCOPIC  I-STAT BETA HCG BLOOD, ED ([place], [facility], [facility] ONLY)    EKG EKG Interpretation  Date/Time:  Tuesday July 16 2018 14:12:10 EDT Ventricular Rate:  105 PR Interval:    QRS Duration: 77 QT Interval:  354 QTC Calculation: 468 R Axis:   -2 Text Interpretation:  Sinus tachycardia No significant change since last tracing Confirmed by Isla Pence [phone]) on 07/16/2018 2:14:17 PM   Radiology Dg Abdomen Acute W/chest  Result Date: 07/16/2018 CLINICAL DATA:  Altered mental status over the past 4 days. EXAM: DG ABDOMEN ACUTE W/ 1V CHEST COMPARISON:  Single view of the chest and CT chest 05/25/2018. FINDINGS: Single-view of the chest demonstrates clear lungs and normal heart size. No pneumothorax or pleural effusion. Two views of the abdomen show no free intraperitoneal air. The bowel gas pattern is nonobstructive. Large volume of stool throughout the colon noted. No abnormal abdominal calcification or focal bony abnormality. IMPRESSION: No acute finding chest or abdomen. Large colonic stool burden. Electronically Signed   By: Inge Rise M.D.   On: 07/16/2018 14:40    Procedures Procedures (including critical care time)   Medications Ordered in ED Medications  magnesium citrate solution 1 Bottle (0 Bottles Oral Hold 07/16/18 1552)  benztropine (COGENTIN) tablet 0.5 mg (0.5 mg Oral Given 07/16/18 1729)  multivitamin with minerals tablet 1 tablet (1 tablet Oral Given 07/16/18 1729)  risperiDONE (RISPERDAL) tablet 0.5 mg (0.5 mg Oral Given 07/16/18 1729)  temazepam (RESTORIL) capsule 15 mg (has no administration in time range)  simvastatin (ZOCOR) tablet 40 mg (has no administration in time range)  sodium chloride 0.9 % bolus 1,000 mL (0 mLs Intravenous Stopped 07/16/18 1643)     Initial Impression / Assessment and Plan / ED Course  I have reviewed the triage vital signs and the nursing notes.  Pertinent labs & imaging results that were available during my care of the patient were reviewed by me and considered in my medical decision making (see chart for details).       Pt's guardian is unable to be reached.  Pt is eating well here.  She is constipated which will be treated.   I will get a TTS consult.  The pt's nurse said pt was very fearful of the her taking off her pants.  We will get SW involved as well.  Insulin was stopped during last admission.  The pt is medically clear.  The pt told me she is worried about going home, so she will be kept overnight pending TTS and SW eval.   Final Clinical Impressions(s) / ED Diagnoses   Final diagnoses:  AMS (altered mental status)  Constipation, unspecified constipation type  Paranoid schizophrenia [facility])    ED Discharge Orders    None       Isla Pence, MD 07/16/18 [phone]
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
## 2018-07-16 NOTE — ED Notes (Signed)
Pt refusing coronavirus swab and placement of purewick. Pt shaking in fear when trying to check if pts brief is wet. Pt is not allowing [place] to take her pants off at all and becomes very fearful.

## 2018-07-16 NOTE — ED Triage Notes (Signed)
Pt BIB EMS from home. Pts family reports progressively worse AMS x4 days. Pt was seen at [facility] x3 days. Pt reports loss of appetite x4 days. Pt has hx of MR and schizophrenia. Pt normally can answer simple questions. Pt normally feeds and bathes self but has not been able to the past few days.   110/80 HR 84 98% RA CBG 220

## 2018-07-16 NOTE — ED Notes (Signed)
Patient transported to X-ray 

## 2018-07-16 NOTE — BH Assessment (Addendum)
Tele Assessment Note   Patient Name: Debbie Dorsey MRN: [id number] Referring Physician: Isla Pence, MD Location of Patient: [facility] Location of Provider: Behavioral Health TTS Department  Ennis Forts is a single 48 y.o. female who presents with AMS to [facility] via EMS. Grandfather has been present for previous ED visit. He stated at that time he was the pt's guardian and could later bring paperwork to the ED. This Probation officer spoke with Debbie Dorsey SW, who report there is no record of guardianship on file for pt at this time.  Pt presents lying in hospital bed on her back for assessment. Her eyes are open and she keeps a gaze at the monitor. Pt spoke rarely, other than softly saying "yes" a few times. By hx, pt's last psychiatric admission was at [facility] 04/02/18-04/09/18 for paranoia & hallucinations. She had been followed outpt at [facility] for years per Grandfather.   Diagnosis: Schizophrenia Disposition:  Per Shuvon Rankin, NP, no disposition is recommended until pt is medically cleared.  Past Medical History:  Past Medical History:  Diagnosis Date  . Diabetes mellitus without complication ([place])     History reviewed. No pertinent surgical history.  Family History:  Family History  Problem Relation Age of Onset  . Mental illness Sister     Social History:  reports that she has never smoked. She has never used smokeless tobacco. She reports that she does not drink alcohol or use drugs.  Additional Social History:     CIWA: CIWA-Ar BP: (!) 153/89 Pulse Rate: (!) 103 COWS:    Allergies: No Known Allergies  Home Medications: (Not in a hospital admission)   OB/GYN Status:  No LMP recorded.  General Assessment Data Location of Assessment: [facility] ED TTS Assessment: In system Is this a Tele or Face-to-Face Assessment?: Tele Assessment Is this an Initial Assessment or a Re-assessment for this encounter?: Initial Assessment Patient Accompanied by:: N/A Language Other than English:  No Living Arrangements: Other (Comment) What gender do you identify as?: Female Marital status: Single Pregnancy Status: Unable to assess Living Arrangements: Other relatives(grandfather) Can pt return to current living arrangement?: (Unknown) Admission Status: Voluntary Is patient capable of signing voluntary admission?: No Referral Source: Self/Family/Friend Insurance type: [place] Living Arrangements: Other relatives(grandfather) Legal Guardian: Other relative Name of Psychiatrist: Beverly Sessions (from previous admission) Name of Therapist: UTA  Education Status Is patient currently in school?: No Is the patient employed, unemployed or receiving disability?: Receiving disability income  Risk to self with the past 6 months Suicidal Ideation: (UTA) Has patient been a risk to self within the past 6 months prior to admission? : (UTA) Suicidal Intent: (UTA) Has patient had any suicidal intent within the past 6 months prior to admission? : (UTA) Is patient at risk for suicide?: Yes Suicidal Plan?: (UTA) Has patient had any suicidal plan within the past 6 months prior to admission? : (UTA) What has been your use of drugs/alcohol within the last 12 months?: (UTA, none by hx) Previous Attempts/Gestures: (UTA) Other Self Harm Risks: AVH Intentional Self Injurious Behavior: (UTA) Family Suicide History: Yes Persecutory voices/beliefs?: (UTA current; yes by hx) Depression: (UTA) Substance abuse history and/or treatment for substance abuse?: No Suicide prevention information given to non-admitted patients: Not applicable  Risk to Others within the past 6 months Homicidal Ideation: (UTA) Does patient have any lifetime risk of violence toward others beyond the six months prior to admission? : Unknown Thoughts of Harm to Others: (UTA) Current Homicidal  Intent: (UTA) Current Homicidal Plan: (UTA) Access to Homicidal Means: (UTA) History of harm to others?:  (UTA) Assessment of Violence: None Noted Does patient have access to weapons?: No(by hx) Criminal Charges Pending?: No Does patient have a court date: No Is patient on probation?: No  Psychosis Hallucinations: (UTA current, yes at previous consult) Delusions: (UTA- persecutory in past "[place]")  Mental Status Report Appearance/Hygiene: Unremarkable Eye Contact: Good Motor Activity: Rigidity Speech: Elective mutism(occasional soft "yes") Level of Consciousness: Quiet/awake Mood: (UTA) Affect: Flat Anxiety Level: (UTA) Thought Processes: Unable to Assess Judgement: Unable to Assess Orientation: Unable to assess Obsessive Compulsive Thoughts/Behaviors: Unable to Assess  Cognitive Functioning Concentration: Unable to Assess Memory: Unable to Assess Is patient IDD: No(per Grandfather at past consult) Insight: Unable to Assess Impulse Control: Unable to Assess Appetite: (UTA) Sleep: Unable to Assess Vegetative Symptoms: Unable to Assess  ADLScreening [facility] Assessment Services) Patient's cognitive ability adequate to safely complete daily activities?: No(per past consult- GF states pt has an aide- daily ADL's) Patient able to express need for assistance with ADLs?: Yes Independently performs ADLs?: No  Prior Inpatient Therapy Prior Inpatient Therapy: Yes Prior Therapy Dates: 04/02/18 Prior Therapy Facilty/Provider(s): [facility] Reason for Treatment: schizophrenia sx  Prior Outpatient Therapy Prior Outpatient Therapy: Yes Prior Therapy Dates: ongoing Prior Therapy Facilty/Provider(s): [facility] Reason for Treatment: schizophrenia Does patient have an ACCT team?: No Does patient have Intensive In-House Services?  : No Does patient have [facility] services? : Yes Does patient have P4CC services?: No  ADL Screening (condition at time of admission) Patient's cognitive ability adequate to safely complete daily activities?: No(per past consult- GF states pt has an aide- daily  ADL's) Patient able to express need for assistance with ADLs?: Yes Independently performs ADLs?: No       Abuse/Neglect Assessment (Assessment to be complete while patient is alone) Abuse/Neglect Assessment Can Be Completed: Unable to assess, patient is non-responsive or altered mental status     Advance Directives (For Healthcare) Does Patient Have a Medical Advance Directive?: Unable to assess, patient is non-responsive or altered mental status          Disposition: Per Shuvon Rankin, NP, no disposition is recommended until pt is medically cleared. Disposition Initial Assessment Completed for this Encounter: Yes  This service was provided via telemedicine using a 2-way, interactive audio and video technology.   Nijel Flink H Cliffton Spradley 07/16/2018 4:30 PM
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
## 2018-07-16 NOTE — Progress Notes (Signed)
Consult request has been received. CSW attempting to follow up at present time.  Per the EPD, the pt is schizophrenic and pt lives with her grandfather.  Per the EPD the pt's grandfasther was to arrive to the hospital, but grandfather never arrived as expected.  Per the EPD, RN states pt was presenting as scared when pt's RN attempted to undress the pt from the waist down, which prompted fears from staff as to what may be happening in the home which would result in this type of fear from the pt.  CSW will continue to follow for D/C needs.  [name]. Latrese Carolan, LCSW, LCAS, CSI Transitions of Care Clinical Social Worker Care Coordination Department Ph: [phone]

## 2018-07-16 NOTE — Progress Notes (Signed)
Received Debbie Dorsey at the change of shift in her room awake in bed watching the TV.  Later she was OOB in the hallway trailing this Probation officer and going into others room. She was redirectable.  Her grandfather called to inquire about her status. She was compliant with her medications. Her verbal communication soft  limited words. She was quiet in her room for half of the night then OOB and asking staff "to come here" with- out further  Directions. Later she urinated in the bed although she was not asleep. She eventually drifted off to sleep early this AM.
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
## 2018-07-16 NOTE — ED Notes (Signed)
Pt given turkey sandwich and juice.  

## 2018-07-17 ENCOUNTER — Encounter (HOSPITAL_COMMUNITY): Payer: Self-pay | Admitting: Registered Nurse

## 2018-07-17 DIAGNOSIS — F200 Paranoid schizophrenia: Secondary | ICD-10-CM | POA: Diagnosis not present

## 2018-07-17 DIAGNOSIS — R4182 Altered mental status, unspecified: Secondary | ICD-10-CM

## 2018-07-17 NOTE — ED Notes (Signed)
Pt DC d off unit to home per MD order. Pt alert, cooperative, no s/s of distress. DC information given and reviewed with pt grandfather with acknowledged understanding. Pt belongings given to grandfather. Pt ambulatory off the unit. Pt escorted by RN and tech. Pt transported by grandfather.
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
## 2018-07-17 NOTE — Progress Notes (Addendum)
CSW received a call from pt's grandfather post-discharge on Wednesday 07/17/18 at 8:20pm stating pt is acting out.  CSW explained  process for inpatient psychiatric referral but actively listened and provided feedback and validation at pt's grandfather's feeling of frustration.  CSW provided pt's grandfather with the phone numbers for [facility] Outpatient office and encouraged them to call them back, explain to them pt just discharged from [facility] ED, that the pt's medications still need to be adjusted due to pt's no longer being at her baseline and requesting an appointment within 7 days and sooner if possible due to pt's recent admission to the [facility] ED.  CSW stated if pt's grandfather felt pt is a danger to herself or others please do not hesitate to call 911.  Pt's grandfather was appreciative and thanked the CSW.  CSW offered to be present at [facility] ED to be reached by phone on 6/11 after 3pm should pt's grandfather have any more questions.  Pt's father was appreciative and thanked the CSW.  CSW will continue to follow for D/C needs.  [name]. Mariusz Jubb, LCSW, LCAS, CSI Transitions of Care Clinical Social Worker Care Coordination Department Ph: [phone]

## 2018-07-17 NOTE — Discharge Instructions (Addendum)
For your behavioral health needs, you are advised to continue treatment with your outpatient provider.  You have an appointment scheduled with Norma Fredrickson, MD at the [facility] at [facility] on Wednesday, September 04, 2018 at 1:00 pm.  You are advised to keep this appointment:       [facility]. [address] [place], [place] [address]      [phone]  If you need bridging services for your psychiatry needs between now and the appointment date, continue treatment with [facility]:       [facility]      [address]      [place], [place] [address]      [phone]      Crisis number: [phone]

## 2018-07-17 NOTE — BH Assessment (Addendum)
[facility] Assessment Progress Note  Per Buford Dresser, DO, this pt does not require psychiatric hospitalization at this time.  Pt is to be discharge from [facility] with recommendation to keep her previously scheduled appointment with Norma Fredrickson, MD at the [facility] [place].  The appointment is scheduled for 09/04/2018 at 13:00.  For bridging services in the meantime, pt is to continue treatment with [facility].  This has been included in pt's discharge instructions.  At 10:09 this writer called pt's grandfather, Teryl Lucy [phone]), with whom pt lives.  He agrees to come to [facility] around 10:40 to pick pt up.  Pt's nurse, Eustaquio Maize, has been notified.  Jalene Mullet, MA Triage Specialist [phone]   Addendum:  Reviewing pt's notes overnight, it appears that pt's grandfather has functioned as pt's guardian, and has assumed that he is pt's legal guardian, but he acknowledges that the court has never assigned guardianship of the pt to him or to anyone else.  This Probation officer has provided [employer] with a [employer] about guardianship to offer to the grandfather when he arrives.  I have agreed to answer any question that he may have if necessary.  Jalene Mullet, [place] Coordinator [phone]

## 2018-07-17 NOTE — Progress Notes (Signed)
CSW contacted Dr. Mariea Clonts about this patient. Dr. Mariea Clonts reports that patient is psych cleared and patient stated she feels safe returning home with her grandfather. CSW was also consulted to reach out to patient's grandfather to discuss process of obtaining legal guardianship. CSW called patient's grandfather and informed him that patient has been psych and medically cleared and set for discharge. Patient's grandfather stated that he is okay with patient returning back to him and that he will come pick her up once she is ready. Patient's grandfather also reports that he is already in the process of obtaining legal guardianship of patient.   [place] Circle, LCSW Transitions of Care Department [facility] ED [phone]

## 2018-07-18 ENCOUNTER — Ambulatory Visit (HOSPITAL_COMMUNITY)
Admission: RE | Admit: 2018-07-18 | Discharge: 2018-07-18 | Disposition: A | Discharge status: Home / Self Care | Payer: Medicare Other | Attending: Psychiatry | Admitting: Psychiatry

## 2018-07-18 DIAGNOSIS — Z79899 Other long term (current) drug therapy: Secondary | ICD-10-CM | POA: Insufficient documentation

## 2018-07-18 DIAGNOSIS — F209 Schizophrenia, unspecified: Secondary | ICD-10-CM | POA: Diagnosis present

## 2018-07-18 DIAGNOSIS — E119 Type 2 diabetes mellitus without complications: Secondary | ICD-10-CM | POA: Insufficient documentation

## 2018-07-18 DIAGNOSIS — Z818 Family history of other mental and behavioral disorders: Secondary | ICD-10-CM | POA: Diagnosis not present

## 2018-07-18 NOTE — H&P (Addendum)
Behavioral Health Medical Screening Exam  Debbie Dorsey is an 48 y.o. female. Pt presented to [facility] as a walk-in accompanied by her grandfather who she lives with and has cared for her for 15 years. He reported that the Pt is paranoid and is trying to run form the home. Pt is seen at [facility] for medication management. Pt was taken off her bi-weekly Risperdal Consta at some point and her behavior and paranoia have increased. She was seen and discharged from the ED on 07/17/2018 for the same presentation. She has a history of IDD. She has an appointment on 09/04/2018 with Dr. Casimiro Needle at [place], until this appointment it is recommended that the Pt continue to follow up at [facility]. Grandfather stated he could not reach anyone at [facility]. TTS counselor provided him with a number to call that would connect him with a person at [facility]. Pt is psychiatrically clear.   Total Time spent with patient: 30 minutes  Psychiatric Specialty Exam: Physical Exam  Constitutional: She appears well-developed and well-nourished.  HENT:  Head: Normocephalic.  Musculoskeletal: Normal range of motion.    Review of Systems  Psychiatric/Behavioral: The patient is nervous/anxious.   All other systems reviewed and are negative.   Blood pressure 101/80, pulse (!) 119, temperature 98 F (36.7 C), temperature source Oral, resp. rate 16, SpO2 100 %, unknown if currently breastfeeding.There is no height or weight on file to calculate BMI.  General Appearance: Casual  Eye Contact:  Fair  Speech:  Slow  Volume:  Decreased  Mood:  Anxious  Affect:  Congruent  Thought Process:  Disorganized and Descriptions of Associations: Loose  Orientation:  Full (Time, Place, and Person)  Thought Content:  Ideas of Reference:   Paranoia and Paranoid Ideation  Suicidal Thoughts:  No  Homicidal Thoughts:  No  Memory:  Immediate;   Fair Recent;   Poor Remote;   Poor  Judgement:  Impaired  Insight:  Lacking   Psychomotor Activity:  Normal  Concentration: Concentration: Fair and Attention Span: Fair  Recall:  Poor  Fund of Knowledge:Poor  Language: Fair  Akathisia:  Negative  Handed:  Right  AIMS (if indicated):     Assets:  Financial Resources/Insurance Housing Physical Health Social Support  Sleep:       Musculoskeletal: Strength & Muscle Tone: within normal limits Gait & Station: not tested Patient leans: N/A  Blood pressure 101/80, pulse (!) 119, temperature 98 F (36.7 C), temperature source Oral, resp. rate 16, SpO2 100 %, unknown if currently breastfeeding.  Recommendations:  Based on my evaluation the patient does not appear to have an emergency medical condition.  Ethelene Hal, NP 07/18/2018, 1:10 PM   Patient's chart reviewed. Reviewed the information documented and agree with the treatment plan.  Buford Dresser, DO 07/18/18 1:41 PM

## 2018-07-18 NOTE — BH Assessment (Signed)
Assessment Note  Debbie Dorsey is an 48 y.o. female. The Pt had selective mutism at different times throughout the assessment. The Pt's guardian "the Pt's grandfather" answered most of the assessment questions. Per Mr. Spell the Pt's grandfather the Pt has not threatened to harm herself or others. Mr. Teryl Lucy states that the Pt has been severely paranoid since Dr. Josph Macho at [facility] changed her medications. Mr. Teryl Lucy states that the Pt has been anxious and has not been able to remain still.  The Pt was recently hospitalized at [facility].   Darnelle Maffucci, NP recommends D/C and follow-up with [facility].  Diagnosis:  F20.0 Schizophrenia  Past Medical History:  Past Medical History:  Diagnosis Date  . Diabetes mellitus without complication ([place])     No past surgical history on file.  Family History:  Family History  Problem Relation Age of Onset  . Mental illness Sister     Social History:  reports that she has never smoked. She has never used smokeless tobacco. She reports that she does not drink alcohol or use drugs.  Additional Social History:  Alcohol / Drug Use Pain Medications: please see mar Prescriptions: please see mar Over the Counter: please see mar History of alcohol / drug use?: No history of alcohol / drug abuse Longest period of sobriety (when/how long): NA  CIWA: CIWA-Ar BP: 101/80 Pulse Rate: (!) 119 COWS:    Allergies: No Known Allergies  Home Medications: (Not in a hospital admission)   OB/GYN Status:  No LMP recorded.  General Assessment Data Location of Assessment: [facility] Assessment Services TTS Assessment: In system Is this a Tele or Face-to-Face Assessment?: Face-to-Face Is this an Initial Assessment or a Re-assessment for this encounter?: Initial Assessment Patient Accompanied by:: N/A Language Other than English: No Living Arrangements: Other (Comment) What gender do you identify as?: Female Marital status: Single Maiden name: NA Pregnancy Status: No Living  Arrangements: Other relatives Can pt return to current living arrangement?: Yes Admission Status: Voluntary Is patient capable of signing voluntary admission?: Yes Referral Source: Self/Family/Friend Insurance type: Medicaid  Medical Screening Exam ([place]) Medical Exam completed: Yes  Crisis Care Plan Living Arrangements: Other relatives Legal Guardian: Maternal Grandfather Name of Psychiatrist: Beverly Sessions (from previous admission) Name of Therapist: [facility]  Education Status Is patient currently in school?: No Is the patient employed, unemployed or receiving disability?: Receiving disability income  Risk to self with the past 6 months Suicidal Ideation: No Has patient been a risk to self within the past 6 months prior to admission? : No Suicidal Intent: No Has patient had any suicidal intent within the past 6 months prior to admission? : No Is patient at risk for suicide?: No Suicidal Plan?: No Has patient had any suicidal plan within the past 6 months prior to admission? : No Access to Means: No What has been your use of drugs/alcohol within the last 12 months?: na Previous Attempts/Gestures: No How many times?: 0 Other Self Harm Risks: na Triggers for Past Attempts: Unpredictable Intentional Self Injurious Behavior: None Family Suicide History: No Recent stressful life event(s): Other (Comment) Persecutory voices/beliefs?: No Depression: No Depression Symptoms: (pt denies) Substance abuse history and/or treatment for substance abuse?: No Suicide prevention information given to non-admitted patients: Not applicable  Risk to Others within the past 6 months Homicidal Ideation: No Does patient have any lifetime risk of violence toward others beyond the six months prior to admission? : No Thoughts of Harm to Others: No Current Homicidal Intent: No Current  Homicidal Plan: No Access to Homicidal Means: No Identified Victim: na History of harm to others?:  No Assessment of Violence: None Noted Violent Behavior Description: na Does patient have access to weapons?: No Criminal Charges Pending?: No Does patient have a court date: No Is patient on probation?: No  Psychosis Hallucinations: None noted Delusions: Unspecified  Mental Status Report Appearance/Hygiene: Unremarkable Eye Contact: Poor Motor Activity: Freedom of movement Speech: Elective mutism Level of Consciousness: Quiet/awake Mood: Euthymic Affect: Flat Anxiety Level: Moderate Thought Processes: Coherent, Relevant Judgement: Impaired Orientation: Unable to assess Obsessive Compulsive Thoughts/Behaviors: None  Cognitive Functioning Concentration: Normal Memory: Unable to Assess Is patient IDD: Yes Level of Function: moderate Is IQ score available?: No Insight: Poor Impulse Control: Poor Appetite: Fair Have you had any weight changes? : No Change Sleep: No Change Total Hours of Sleep: 8 Vegetative Symptoms: None  ADLScreening [facility] Assessment Services) Patient's cognitive ability adequate to safely complete daily activities?: No Patient able to express need for assistance with ADLs?: No Independently performs ADLs?: Yes (appropriate for developmental age)  Prior Inpatient Therapy Prior Inpatient Therapy: Yes Prior Therapy Dates: 04/02/18 Prior Therapy Facilty/Provider(s): [facility] Reason for Treatment: schizophrenia sx  Prior Outpatient Therapy Prior Outpatient Therapy: Yes Prior Therapy Dates: ongoing Prior Therapy Facilty/Provider(s): [facility] Reason for Treatment: schizophrenia Does patient have an ACCT team?: No Does patient have Intensive In-House Services?  : No Does patient have [facility] services? : No Does patient have P4CC services?: No  ADL Screening (condition at time of admission) Patient's cognitive ability adequate to safely complete daily activities?: No Is the patient deaf or have difficulty hearing?: No Does the patient have difficulty  seeing, even when wearing glasses/contacts?: No Does the patient have difficulty concentrating, remembering, or making decisions?: Yes Patient able to express need for assistance with ADLs?: No Does the patient have difficulty dressing or bathing?: No Independently performs ADLs?: Yes (appropriate for developmental age)       Abuse/Neglect Assessment (Assessment to be complete while patient is alone) Abuse/Neglect Assessment Can Be Completed: Yes Physical Abuse: Denies Verbal Abuse: Denies Sexual Abuse: Denies Exploitation of patient/patient's resources: Denies     Regulatory affairs officer (For Healthcare) Does Patient Have a Medical Advance Directive?: No Would patient like information on creating a medical advance directive?: No - Patient declined          Disposition:  Disposition Initial Assessment Completed for this Encounter: Yes Disposition of Patient: Discharge  On Site Evaluation by:   Reviewed with Physician:    Cyndia Bent 07/18/2018 3:19 PM

## 2018-07-28 ENCOUNTER — Other Ambulatory Visit: Payer: Self-pay

## 2018-07-28 ENCOUNTER — Inpatient Hospital Stay (HOSPITAL_COMMUNITY)
Admission: EM | Admit: 2018-07-28 | Discharge: 2018-07-31 | DRG: 638 | Disposition: A | Discharge status: Other Acute Inpatient Hospital | Payer: Medicare Other | Attending: Internal Medicine | Admitting: Internal Medicine

## 2018-07-28 ENCOUNTER — Encounter (HOSPITAL_COMMUNITY): Payer: Self-pay | Admitting: *Deleted

## 2018-07-28 DIAGNOSIS — F29 Unspecified psychosis not due to a substance or known physiological condition: Secondary | ICD-10-CM | POA: Diagnosis not present

## 2018-07-28 DIAGNOSIS — Z818 Family history of other mental and behavioral disorders: Secondary | ICD-10-CM

## 2018-07-28 DIAGNOSIS — Z86711 Personal history of pulmonary embolism: Secondary | ICD-10-CM

## 2018-07-28 DIAGNOSIS — E785 Hyperlipidemia, unspecified: Secondary | ICD-10-CM | POA: Diagnosis present

## 2018-07-28 DIAGNOSIS — Z20828 Contact with and (suspected) exposure to other viral communicable diseases: Secondary | ICD-10-CM | POA: Diagnosis present

## 2018-07-28 DIAGNOSIS — F209 Schizophrenia, unspecified: Secondary | ICD-10-CM

## 2018-07-28 DIAGNOSIS — R45851 Suicidal ideations: Secondary | ICD-10-CM | POA: Diagnosis present

## 2018-07-28 DIAGNOSIS — E1110 Type 2 diabetes mellitus with ketoacidosis without coma: Secondary | ICD-10-CM | POA: Diagnosis not present

## 2018-07-28 DIAGNOSIS — E860 Dehydration: Secondary | ICD-10-CM | POA: Diagnosis present

## 2018-07-28 DIAGNOSIS — N179 Acute kidney failure, unspecified: Secondary | ICD-10-CM | POA: Diagnosis present

## 2018-07-28 DIAGNOSIS — F200 Paranoid schizophrenia: Secondary | ICD-10-CM | POA: Diagnosis present

## 2018-07-28 DIAGNOSIS — E876 Hypokalemia: Secondary | ICD-10-CM | POA: Diagnosis present

## 2018-07-28 DIAGNOSIS — Z9119 Patient's noncompliance with other medical treatment and regimen: Secondary | ICD-10-CM

## 2018-07-28 LAB — RAPID URINE DRUG SCREEN, HOSP PERFORMED
Amphetamines: NOT DETECTED
Barbiturates: NOT DETECTED
Benzodiazepines: NOT DETECTED
Cocaine: NOT DETECTED
Opiates: NOT DETECTED
Tetrahydrocannabinol: NOT DETECTED

## 2018-07-28 LAB — COMPREHENSIVE METABOLIC PANEL
ALT: 31 U/L (ref 0–44)
AST: 41 U/L (ref 15–41)
Albumin: 4 g/dL (ref 3.5–5.0)
Alkaline Phosphatase: 49 U/L (ref 38–126)
Anion gap: 13 (ref 5–15)
BUN: 19 mg/dL (ref 6–20)
CO2: 19 mmol/L — ABNORMAL LOW (ref 22–32)
Calcium: 10 mg/dL (ref 8.9–10.3)
Chloride: 108 mmol/L (ref 98–111)
Creatinine, Ser: 1.33 mg/dL — ABNORMAL HIGH (ref 0.44–1.00)
GFR calc Af Amer: 55 mL/min — ABNORMAL LOW (ref >60)
GFR calc non Af Amer: 48 mL/min — ABNORMAL LOW (ref >60)
Glucose, Bld: 427 mg/dL — ABNORMAL HIGH (ref 70–99)
Potassium: 4.7 mmol/L (ref 3.5–5.1)
Sodium: 140 mmol/L (ref 135–145)
Total Bilirubin: 0.9 mg/dL (ref 0.3–1.2)
Total Protein: 7 g/dL (ref 6.5–8.1)

## 2018-07-28 LAB — CBC WITH DIFFERENTIAL/PLATELET
Abs Immature Granulocytes: 0.01 10*3/uL (ref 0.00–0.07)
Basophils Absolute: 0 10*3/uL (ref 0.0–0.1)
Basophils Relative: 0 %
Eosinophils Absolute: 0 10*3/uL (ref 0.0–0.5)
Eosinophils Relative: 0 %
HCT: 38.8 % (ref 36.0–46.0)
Hemoglobin: 12.5 g/dL (ref 12.0–15.0)
Immature Granulocytes: 0 %
Lymphocytes Relative: 21 %
Lymphs Abs: 2 10*3/uL (ref 0.7–4.0)
MCH: 32 pg (ref 26.0–34.0)
MCHC: 32.2 g/dL (ref 30.0–36.0)
MCV: 99.2 fL (ref 80.0–100.0)
Monocytes Absolute: 0.5 10*3/uL (ref 0.1–1.0)
Monocytes Relative: 5 %
Neutro Abs: 6.9 10*3/uL (ref 1.7–7.7)
Neutrophils Relative %: 74 %
Platelets: 340 10*3/uL (ref 150–400)
RBC: 3.91 MIL/uL (ref 3.87–5.11)
RDW: 13.7 % (ref 11.5–15.5)
WBC: 9.5 10*3/uL (ref 4.0–10.5)
nRBC: 0 % (ref 0.0–0.2)

## 2018-07-28 LAB — I-STAT BETA HCG BLOOD, ED (MC, WL, AP ONLY): I-stat hCG, quantitative: <5 m[IU]/mL (ref <5)

## 2018-07-28 LAB — URINALYSIS, ROUTINE W REFLEX MICROSCOPIC
Bacteria, UA: NONE SEEN
Bilirubin Urine: NEGATIVE
Glucose, UA: >=500 mg/dL — AB
Ketones, ur: 80 mg/dL — AB
Leukocytes,Ua: NEGATIVE
Nitrite: NEGATIVE
Protein, ur: 30 mg/dL — AB
Specific Gravity, Urine: 1.032 — ABNORMAL HIGH (ref 1.005–1.030)
pH: 5 (ref 5.0–8.0)

## 2018-07-28 LAB — CBG MONITORING, ED: Glucose-Capillary: 317 mg/dL — ABNORMAL HIGH (ref 70–99)

## 2018-07-28 LAB — ETHANOL: Alcohol, Ethyl (B): <10 mg/dL (ref <10)

## 2018-07-28 MED ORDER — DEXTROSE-NACL 5-0.45 % IV SOLN: INTRAVENOUS | Status: DC

## 2018-07-28 MED ORDER — SODIUM CHLORIDE 0.9 % IV BOLUS
1000.0000 mL | Freq: Once | INTRAVENOUS | Status: AC
  Administered 2018-07-29: 01:00:00 1000 mL via INTRAVENOUS

## 2018-07-28 MED ORDER — HALOPERIDOL LACTATE 5 MG/ML IJ SOLN
5.0000 mg | Freq: Once | INTRAMUSCULAR | Status: AC
  Administered 2018-07-29: 5 mg via INTRAMUSCULAR
  Filled 2018-07-28: qty 1

## 2018-07-28 MED ORDER — INSULIN REGULAR BOLUS VIA INFUSION
0.0000 [IU] | Freq: Three times a day (TID) | INTRAVENOUS | Status: DC
  Filled 2018-07-28: qty 10

## 2018-07-28 MED ORDER — INSULIN REGULAR(HUMAN) IN NACL 100-0.9 UT/100ML-% IV SOLN
INTRAVENOUS | Status: DC
  Filled 2018-07-28: qty 100

## 2018-07-28 MED ORDER — DEXTROSE 50 % IV SOLN: 25.0000 mL | INTRAVENOUS | Status: DC | PRN

## 2018-07-28 MED ORDER — SODIUM CHLORIDE 0.9 % IV SOLN: INTRAVENOUS | Status: DC

## 2018-07-28 NOTE — ED Notes (Signed)
CBG results of 317 reported to [place], Therapist, sports.

## 2018-07-28 NOTE — ED Provider Notes (Signed)
11:13 PM  Assumed care from Dr. Ronnald Nian.  Patient is a 48 y.o. F with paranoid schizophrenia, DM who presents to ED with IVC for bizarre behavior, trying to jump out of a window.  Patient cared for her by her grandfather.  Patient is hyperglycemic here.  Getting IVF.  11:30 PM  Pt is psychotic but currently calm but refusing interventions.  Will give IM Haldol as I do not feel she has capacity to make decisions at this time.  Her urine shows large ketones and her bicarb is 19.  I suspect that she is in early DKA.  It appears she was taken off of insulin during her last admission in April 2020 with plans to follow-up as an outpatient for her diabetes.  She has a history of medical noncompliance.  Will start IV fluids, IV insulin.  Will discuss with medicine for admission.  Patient is under IVC.  12:02 AM Discussed patient's case with hospitalist, Dr. Hal Hope.  I have recommended admission and patient (and family if present) agree with this plan. Admitting physician will place admission orders.   I reviewed all nursing notes, vitals, pertinent previous records, EKGs, lab and urine results, imaging (as available).     CRITICAL CARE Performed by: [name]   Total critical care time: 45 minutes  Critical care time was exclusive of separately billable procedures and treating other patients.  Critical care was necessary to treat or prevent imminent or life-threatening deterioration.  Critical care was time spent personally by me on the following activities: development of treatment plan with patient and/or surrogate as well as nursing, discussions with consultants, evaluation of patient's response to treatment, examination of patient, obtaining history from patient or surrogate, ordering and performing treatments and interventions, ordering and review of laboratory studies, ordering and review of radiographic studies, pulse oximetry and re-evaluation of patient's condition.     EKG  Interpretation  Date/Time:  Sunday July 28 2018 23:48:20 EDT Ventricular Rate:  131 PR Interval:    QRS Duration: 69 QT Interval:  277 QTC Calculation: 405 R Axis:   88 Text Interpretation:  Sinus tachycardia Probable left atrial enlargement ST elevation, consider inferior injury No significant change since last tracing Confirmed by Delane Stalling, Cyril Mourning [phone]) on 07/28/2018 11:54:21 PM         Parag Dorton, Delice Bison, DO 07/29/18 0002

## 2018-07-28 NOTE — ED Notes (Addendum)
Pt not staying on the stretcher 2 security  At  The bedside

## 2018-07-28 NOTE — ED Provider Notes (Signed)
[facility] EMERGENCY DEPARTMENT Provider Note   CSN: [id number] Arrival date & time: 07/28/18  2045    History   Chief Complaint Chief Complaint  Patient presents with   Psychiatric Evaluation    HPI Debbie Dorsey is a 48 y.o. female.     The history is provided by the patient.  Mental Health Problem Presenting symptoms: bizarre behavior and suicidal thoughts   Patient accompanied by:  Law enforcement Degree of incapacity (severity):  Severe Onset quality:  Sudden Timing:  Constant Progression:  Unchanged Chronicity:  Recurrent Context: not noncompliant   Treatment compliance:  Unable to specify Associated symptoms: no abdominal pain and no chest pain   Risk factors: hx of mental illness     Past Medical History:  Diagnosis Date   Diabetes mellitus without complication [facility])     Patient Active Problem List   Diagnosis Date Noted   DKA (diabetic ketoacidoses) ([place]) 07/29/2018   Altered mental status, unspecified    Acute encephalopathy 05/25/2018   Acute pulmonary embolism ([place]) 05/25/2018   Type 2 diabetes mellitus ([place]) 05/25/2018   Schizophrenia ([place]) 04/08/2018   Paranoid schizophrenia ([place]) 04/02/2018    History reviewed. No pertinent surgical history.   OB History    Gravida  1   Para      Term      Preterm      AB      Living        SAB      TAB      Ectopic      Multiple      Live Births               Home Medications    Prior to Admission medications   Medication Sig Start Date End Date Taking? Authorizing Provider  benztropine (COGENTIN) 0.5 MG tablet Take 1 tablet (0.5 mg total) by mouth 2 (two) times daily. Restart on 06/07/2018 as per psychiatry 06/07/18   Aline August, MD  Multiple Vitamins-Minerals (MULTIVITAL) CHEW Chew 2 each by mouth daily.    [provider]  risperiDONE (RISPERDAL) 0.5 MG tablet Take 1 tablet (0.5 mg total) by mouth 2 (two) times daily. Start on 06/07/2018  as per psychiatry 06/07/18   Aline August, MD  Rivaroxaban 15 & 20 MG TBPK Take as directed on package: Start with one 15mg  tablet by mouth twice a day with food. On Day 22, switch to one 20mg  tablet once a day with food. 05/31/18   Aline August, MD  simvastatin (ZOCOR) 40 MG tablet Take 40 mg by mouth daily at 12 noon.     [provider]  temazepam (RESTORIL) 15 MG capsule Take 1 capsule (15 mg total) by mouth at bedtime as needed for sleep. 04/09/18   Johnn Hai, MD    Family History Family History  Problem Relation Age of Onset   Mental illness Sister     Social History Social History   Tobacco Use   Smoking status: Never Smoker   Smokeless tobacco: Never Used  Substance Use Topics   Alcohol use: No   Drug use: No     Allergies   Patient has no known allergies.   Review of Systems Review of Systems  Constitutional: Negative for chills and fever.  HENT: Negative for ear pain and sore throat.   Eyes: Negative for pain and visual disturbance.  Respiratory: Negative for cough and shortness of breath.   Cardiovascular: Negative for chest pain  and palpitations.  Gastrointestinal: Negative for abdominal pain and vomiting.  Genitourinary: Negative for dysuria and hematuria.  Musculoskeletal: Negative for arthralgias and back pain.  Skin: Negative for color change and rash.  Neurological: Negative for seizures and syncope.  Psychiatric/Behavioral: Positive for behavioral problems and suicidal ideas.  All other systems reviewed and are negative.    Physical Exam Updated Vital Signs  ED Triage Vitals  Enc Vitals Group     BP 07/28/18 2059 133/65     Pulse Rate 07/28/18 2059 63     Resp 07/28/18 2059 18     Temp 07/28/18 2059 98.2 F (36.8 C)     Temp src --      SpO2 07/28/18 2059 97 %     Weight 07/28/18 2102 95 lb 0.3 oz (43.1 kg)     Height 07/28/18 2102 4\' 11"  (1.499 m)     Head Circumference --      Peak Flow --      Pain Score 07/28/18 2100 0      Pain Loc --      Pain Edu? --      Excl. in [place]? --     Physical Exam Vitals signs and nursing note reviewed.  Constitutional:      General: She is not in acute distress.    Appearance: She is well-developed.  HENT:     Head: Normocephalic and atraumatic.     Nose: Nose normal.  Eyes:     Extraocular Movements: Extraocular movements intact.     Conjunctiva/sclera: Conjunctivae normal.     Pupils: Pupils are equal, round, and reactive to light.  Neck:     Musculoskeletal: Normal range of motion and neck supple.  Cardiovascular:     Rate and Rhythm: Normal rate and regular rhythm.     Pulses: Normal pulses.     Heart sounds: Normal heart sounds. No murmur.  Pulmonary:     Effort: Pulmonary effort is normal. No respiratory distress.     Breath sounds: Normal breath sounds.  Abdominal:     Palpations: Abdomen is soft.     Tenderness: There is no abdominal tenderness.  Skin:    General: Skin is warm and dry.     Capillary Refill: Capillary refill takes less than 2 seconds.  Neurological:     General: No focal deficit present.     Mental Status: She is alert.  Psychiatric:        Mood and Affect: Affect is labile.        Speech: Speech is delayed.        Behavior: Behavior is agitated.        Thought Content: Thought content does not include homicidal or suicidal ideation. Thought content does not include homicidal or suicidal plan.        Judgment: Judgment is impulsive.      ED Treatments / Results  Labs (all labs ordered are listed, but only abnormal results are displayed) Labs Reviewed  COMPREHENSIVE METABOLIC PANEL - Abnormal; Notable for the following components:      Result Value   CO2 19 (*)    Glucose, Bld 427 (*)    Creatinine, Ser 1.33 (*)    GFR calc non Af Amer 48 (*)    GFR calc Af Amer 55 (*)    All other components within normal limits  URINALYSIS, ROUTINE W REFLEX MICROSCOPIC - Abnormal; Notable for the following components:   APPearance HAZY  (*)  Specific Gravity, Urine 1.032 (*)    Glucose, UA >=500 (*)    Hgb urine dipstick LARGE (*)    Ketones, ur 80 (*)    Protein, ur 30 (*)    All other components within normal limits  CBG MONITORING, ED - Abnormal; Notable for the following components:   Glucose-Capillary 317 (*)    All other components within normal limits  NOVEL CORONAVIRUS, NAA (HOSPITAL ORDER, SEND-OUT TO REF LAB)  ETHANOL  RAPID URINE DRUG SCREEN, HOSP PERFORMED  CBC WITH DIFFERENTIAL/PLATELET  I-STAT BETA HCG BLOOD, ED ([facility], [facility], [facility] ONLY)    EKG EKG Interpretation  Date/Time:  Sunday July 28 2018 23:48:20 EDT Ventricular Rate:  131 PR Interval:    QRS Duration: 69 QT Interval:  277 QTC Calculation: 405 R Axis:   88 Text Interpretation:  Sinus tachycardia Probable left atrial enlargement ST elevation, consider inferior injury No significant change since last tracing Confirmed by [name] [phone]) on 07/28/2018 11:54:21 PM   Radiology No results found.  Procedures Procedures (including critical care time)  Medications Ordered in ED Medications  sodium chloride 0.9 % bolus 1,000 mL (has no administration in time range)  dextrose 5 %-0.45 % sodium chloride infusion (has no administration in time range)  insulin regular bolus via infusion 0-10 Units (has no administration in time range)  insulin regular, human (MYXREDLIN) 100 units/ 100 mL infusion (has no administration in time range)  dextrose 50 % solution 25 mL (has no administration in time range)  0.9 %  sodium chloride infusion (has no administration in time range)  haloperidol lactate (HALDOL) injection 5 mg (5 mg Intramuscular Given 07/29/18 0004)     Initial Impression / Assessment and Plan / ED Course  I have reviewed the triage vital signs and the nursing notes.  Pertinent labs & imaging results that were available during my care of the patient were reviewed by me and considered in my medical decision making (see chart for  details).     Debbie Dorsey is a 48 year old female with history of type 2 diabetes, paranoid schizophrenia who presents to the ED with IVC.  Patient with normal vitals.  No fever.  IVC filled out by grandfather as patient was having more erratic behavior today.  IVC states patient try to jump out of a window.  Patient overall appears well with me.  She denies any suicidal homicidal ideation.  However, she has been intermittently agitated with staff.  Will get medical clearance labs.  First look has been filled out.  IVC has already been filled out by grandfather and police.  Patient overall with unremarkable labs except for elevated blood sugar greater than 400.  Anion gap mildly elevated.  Awaiting urinalysis.  Patient has been on insulin in the past however due to noncompliance and difficulty given her mental history she is not on any diabetic medications.  Patient was signed out to oncoming ED staff.  If patient has ketones in the urine may need admission for diabetes control/mild DKA. Kidney function is also mildly elevated.  Suspect that if patient needs any IV fluids or insulin, given the difficulty with taking care of her and her refusing IVs may need admission to medically stabilize her and create long long-term diabetic plan.  Will likely need psychiatric admission following this. Patient was handed off to Dr. Leonides Schanz.  Please see her note for the results, evaluation, disposition of the patient.  This chart was dictated using voice recognition software.  Despite  best efforts to proofread,  errors can occur which can change the documentation meaning.    Final Clinical Impressions(s) / ED Diagnoses   Final diagnoses:  Type 2 diabetes mellitus with ketoacidosis without coma, without long-term current use of insulin ([place])  Psychosis, unspecified psychosis type [facility])    ED Discharge Orders    None       Lennice Sites, DO 07/29/18 0020

## 2018-07-28 NOTE — ED Notes (Signed)
ivc papers faxed to behavorial   Pt dressed out in burgundy scrubs.  Pt will not stay on stretcher trying to leave

## 2018-07-28 NOTE — ED Notes (Signed)
TTS at bedside, sitter at bedside with patient

## 2018-07-28 NOTE — ED Triage Notes (Signed)
The pt was brought in by gpd called to grandparents for ivc pt afraid minimal speech

## 2018-07-28 NOTE — ED Notes (Signed)
Pt wanded by security. 

## 2018-07-28 NOTE — [facility] Counselor (Signed)
Clinician spoke to Dr. Leonides Schanz and noted the TTS consult was removed, the pt is to be roomed on a medical floor. TTS assessment is not needed. Psych consult to be requested for the pt, the psychiatrist will assess pt while on the medical floor. Discussed with Altha Harm, RN.    Vertell Novak, [place], [facility], [facility] Triage Specialist [phone]

## 2018-07-29 ENCOUNTER — Encounter (HOSPITAL_COMMUNITY): Payer: Self-pay | Admitting: Internal Medicine

## 2018-07-29 DIAGNOSIS — F419 Anxiety disorder, unspecified: Secondary | ICD-10-CM | POA: Diagnosis present

## 2018-07-29 DIAGNOSIS — Z79899 Other long term (current) drug therapy: Secondary | ICD-10-CM | POA: Diagnosis not present

## 2018-07-29 DIAGNOSIS — N179 Acute kidney failure, unspecified: Secondary | ICD-10-CM | POA: Diagnosis present

## 2018-07-29 DIAGNOSIS — Z794 Long term (current) use of insulin: Secondary | ICD-10-CM | POA: Diagnosis not present

## 2018-07-29 DIAGNOSIS — F209 Schizophrenia, unspecified: Secondary | ICD-10-CM | POA: Diagnosis present

## 2018-07-29 DIAGNOSIS — F200 Paranoid schizophrenia: Secondary | ICD-10-CM | POA: Diagnosis present

## 2018-07-29 DIAGNOSIS — M797 Fibromyalgia: Secondary | ICD-10-CM | POA: Diagnosis present

## 2018-07-29 DIAGNOSIS — Z9119 Patient's noncompliance with other medical treatment and regimen: Secondary | ICD-10-CM | POA: Diagnosis not present

## 2018-07-29 DIAGNOSIS — R000 Tachycardia, unspecified: Secondary | ICD-10-CM | POA: Diagnosis present

## 2018-07-29 DIAGNOSIS — E119 Type 2 diabetes mellitus without complications: Secondary | ICD-10-CM | POA: Diagnosis present

## 2018-07-29 DIAGNOSIS — Z7901 Long term (current) use of anticoagulants: Secondary | ICD-10-CM | POA: Diagnosis not present

## 2018-07-29 DIAGNOSIS — E876 Hypokalemia: Secondary | ICD-10-CM | POA: Diagnosis present

## 2018-07-29 DIAGNOSIS — F940 Selective mutism: Secondary | ICD-10-CM | POA: Diagnosis present

## 2018-07-29 DIAGNOSIS — Z818 Family history of other mental and behavioral disorders: Secondary | ICD-10-CM | POA: Diagnosis not present

## 2018-07-29 DIAGNOSIS — I959 Hypotension, unspecified: Secondary | ICD-10-CM | POA: Diagnosis present

## 2018-07-29 DIAGNOSIS — F29 Unspecified psychosis not due to a substance or known physiological condition: Secondary | ICD-10-CM | POA: Diagnosis present

## 2018-07-29 DIAGNOSIS — E1110 Type 2 diabetes mellitus with ketoacidosis without coma: Secondary | ICD-10-CM | POA: Diagnosis present

## 2018-07-29 DIAGNOSIS — Z86711 Personal history of pulmonary embolism: Secondary | ICD-10-CM | POA: Diagnosis not present

## 2018-07-29 DIAGNOSIS — E860 Dehydration: Secondary | ICD-10-CM | POA: Diagnosis present

## 2018-07-29 DIAGNOSIS — R45851 Suicidal ideations: Secondary | ICD-10-CM | POA: Diagnosis present

## 2018-07-29 DIAGNOSIS — R4189 Other symptoms and signs involving cognitive functions and awareness: Secondary | ICD-10-CM | POA: Diagnosis not present

## 2018-07-29 DIAGNOSIS — E1165 Type 2 diabetes mellitus with hyperglycemia: Secondary | ICD-10-CM | POA: Diagnosis not present

## 2018-07-29 DIAGNOSIS — Z9114 Patient's other noncompliance with medication regimen: Secondary | ICD-10-CM | POA: Diagnosis not present

## 2018-07-29 DIAGNOSIS — E785 Hyperlipidemia, unspecified: Secondary | ICD-10-CM | POA: Diagnosis present

## 2018-07-29 DIAGNOSIS — Z20828 Contact with and (suspected) exposure to other viral communicable diseases: Secondary | ICD-10-CM | POA: Diagnosis present

## 2018-07-29 LAB — CBG MONITORING, ED
Glucose-Capillary: 267 mg/dL — ABNORMAL HIGH (ref 70–99)
Glucose-Capillary: 159 mg/dL — ABNORMAL HIGH (ref 70–99)
Glucose-Capillary: 132 mg/dL — ABNORMAL HIGH (ref 70–99)
Glucose-Capillary: 167 mg/dL — ABNORMAL HIGH (ref 70–99)
Glucose-Capillary: 178 mg/dL — ABNORMAL HIGH (ref 70–99)
Glucose-Capillary: 206 mg/dL — ABNORMAL HIGH (ref 70–99)
Glucose-Capillary: 208 mg/dL — ABNORMAL HIGH (ref 70–99)

## 2018-07-29 LAB — BASIC METABOLIC PANEL
Anion gap: 6 (ref 5–15)
Anion gap: 7 (ref 5–15)
Anion gap: 11 (ref 5–15)
BUN: 16 mg/dL (ref 6–20)
BUN: 15 mg/dL (ref 6–20)
BUN: 13 mg/dL (ref 6–20)
CO2: 22 mmol/L (ref 22–32)
CO2: 24 mmol/L (ref 22–32)
CO2: 19 mmol/L — ABNORMAL LOW (ref 22–32)
Calcium: 8.5 mg/dL — ABNORMAL LOW (ref 8.9–10.3)
Calcium: 8.9 mg/dL (ref 8.9–10.3)
Calcium: 8.8 mg/dL — ABNORMAL LOW (ref 8.9–10.3)
Chloride: 110 mmol/L (ref 98–111)
Chloride: 111 mmol/L (ref 98–111)
Chloride: 111 mmol/L (ref 98–111)
Creatinine, Ser: 0.83 mg/dL (ref 0.44–1.00)
Creatinine, Ser: 0.86 mg/dL (ref 0.44–1.00)
Creatinine, Ser: 0.83 mg/dL (ref 0.44–1.00)
GFR calc Af Amer: >60 mL/min (ref >60)
GFR calc Af Amer: >60 mL/min (ref >60)
GFR calc Af Amer: >60 mL/min (ref >60)
GFR calc non Af Amer: >60 mL/min (ref >60)
GFR calc non Af Amer: >60 mL/min (ref >60)
GFR calc non Af Amer: >60 mL/min (ref >60)
Glucose, Bld: 473 mg/dL — ABNORMAL HIGH (ref 70–99)
Glucose, Bld: 152 mg/dL — ABNORMAL HIGH (ref 70–99)
Glucose, Bld: 212 mg/dL — ABNORMAL HIGH (ref 70–99)
Potassium: 3.4 mmol/L — ABNORMAL LOW (ref 3.5–5.1)
Potassium: 4.3 mmol/L (ref 3.5–5.1)
Potassium: 3.7 mmol/L (ref 3.5–5.1)
Sodium: 138 mmol/L (ref 135–145)
Sodium: 142 mmol/L (ref 135–145)
Sodium: 141 mmol/L (ref 135–145)

## 2018-07-29 LAB — GLUCOSE, CAPILLARY
Glucose-Capillary: 112 mg/dL — ABNORMAL HIGH (ref 70–99)
Glucose-Capillary: 17 mg/dL — CL (ref 70–99)
Glucose-Capillary: 203 mg/dL — ABNORMAL HIGH (ref 70–99)
Glucose-Capillary: 220 mg/dL — ABNORMAL HIGH (ref 70–99)
Glucose-Capillary: 257 mg/dL — ABNORMAL HIGH (ref 70–99)
Glucose-Capillary: 64 mg/dL — ABNORMAL LOW (ref 70–99)
Glucose-Capillary: 76 mg/dL (ref 70–99)

## 2018-07-29 LAB — CBC
HCT: 32.8 % — ABNORMAL LOW (ref 36.0–46.0)
Hemoglobin: 10.6 g/dL — ABNORMAL LOW (ref 12.0–15.0)
MCH: 32 pg (ref 26.0–34.0)
MCHC: 32.3 g/dL (ref 30.0–36.0)
MCV: 99.1 fL (ref 80.0–100.0)
Platelets: 289 10*3/uL (ref 150–400)
RBC: 3.31 MIL/uL — ABNORMAL LOW (ref 3.87–5.11)
RDW: 13.6 % (ref 11.5–15.5)
WBC: 7 10*3/uL (ref 4.0–10.5)
nRBC: 0 % (ref 0.0–0.2)

## 2018-07-29 LAB — TROPONIN I: Troponin I: <0.03 ng/mL (ref <0.03)

## 2018-07-29 MED ORDER — INSULIN ASPART 100 UNIT/ML ~~LOC~~ SOLN
0.0000 [IU] | SUBCUTANEOUS | Status: DC
  Administered 2018-07-29 (×2): 3 [IU] via SUBCUTANEOUS
  Administered 2018-07-30: 5 [IU] via SUBCUTANEOUS
  Administered 2018-07-30: 04:00:00 1 [IU] via SUBCUTANEOUS
  Administered 2018-07-30: 2 [IU] via SUBCUTANEOUS
  Administered 2018-07-30 (×3): 3 [IU] via SUBCUTANEOUS
  Administered 2018-07-31: 05:00:00 1 [IU] via SUBCUTANEOUS
  Administered 2018-07-31: 12:00:00 5 [IU] via SUBCUTANEOUS

## 2018-07-29 MED ORDER — RISPERIDONE 0.5 MG PO TABS
0.5000 mg | ORAL_TABLET | Freq: Two times a day (BID) | ORAL | Status: DC
  Administered 2018-07-29 – 2018-07-30 (×3): 0.5 mg via ORAL
  Filled 2018-07-29 (×5): qty 1

## 2018-07-29 MED ORDER — INSULIN REGULAR(HUMAN) IN NACL 100-0.9 UT/100ML-% IV SOLN
INTRAVENOUS | Status: DC
  Administered 2018-07-29: 01:00:00 2.1 [IU]/h via INTRAVENOUS

## 2018-07-29 MED ORDER — SIMVASTATIN 20 MG PO TABS
40.0000 mg | ORAL_TABLET | Freq: Every day | ORAL | Status: DC
  Administered 2018-07-30: 12:00:00 40 mg via ORAL
  Filled 2018-07-29: qty 2

## 2018-07-29 MED ORDER — DEXTROSE 50 % IV SOLN
1.0000 | INTRAVENOUS | Status: DC | PRN
  Administered 2018-07-29: 25 mL via INTRAVENOUS

## 2018-07-29 MED ORDER — SODIUM CHLORIDE 0.9 % IV SOLN
INTRAVENOUS | Status: AC
  Administered 2018-07-29: 08:00:00 via INTRAVENOUS

## 2018-07-29 MED ORDER — LORAZEPAM 2 MG/ML IJ SOLN
INTRAMUSCULAR | Status: AC
  Administered 2018-07-29: 10:00:00 2 mg via INTRAVENOUS
  Filled 2018-07-29: qty 1

## 2018-07-29 MED ORDER — HALOPERIDOL LACTATE 5 MG/ML IJ SOLN
5.0000 mg | Freq: Four times a day (QID) | INTRAMUSCULAR | Status: DC | PRN
  Administered 2018-07-29 (×2): 5 mg via INTRAVENOUS
  Filled 2018-07-29 (×3): qty 1

## 2018-07-29 MED ORDER — LORAZEPAM 2 MG/ML IJ SOLN
INTRAMUSCULAR | Status: AC
  Administered 2018-07-29: 1 mg via INTRAVENOUS
  Filled 2018-07-29: qty 1

## 2018-07-29 MED ORDER — SODIUM CHLORIDE 0.9 % IV SOLN: INTRAVENOUS | Status: DC

## 2018-07-29 MED ORDER — INSULIN GLARGINE 100 UNIT/ML ~~LOC~~ SOLN
15.0000 [IU] | Freq: Every day | SUBCUTANEOUS | Status: DC
  Administered 2018-07-29 – 2018-07-31 (×3): 15 [IU] via SUBCUTANEOUS
  Filled 2018-07-29 (×3): qty 0.15

## 2018-07-29 MED ORDER — LORAZEPAM 2 MG/ML IJ SOLN
5.0000 mg | Freq: Once | INTRAMUSCULAR | Status: AC
  Administered 2018-07-29: 10:00:00 2 mg via INTRAVENOUS

## 2018-07-29 MED ORDER — DEXTROSE-NACL 5-0.45 % IV SOLN
INTRAVENOUS | Status: DC
  Administered 2018-07-29: 02:00:00 via INTRAVENOUS

## 2018-07-29 MED ORDER — DEXTROSE 50 % IV SOLN
INTRAVENOUS | Status: AC
  Filled 2018-07-29: qty 50

## 2018-07-29 MED ORDER — HALOPERIDOL LACTATE 5 MG/ML IJ SOLN
INTRAMUSCULAR | Status: AC
  Filled 2018-07-29: qty 1

## 2018-07-29 MED ORDER — ENOXAPARIN SODIUM 30 MG/0.3ML ~~LOC~~ SOLN: 30.0000 mg | Freq: Every day | SUBCUTANEOUS | Status: DC

## 2018-07-29 MED ORDER — BENZTROPINE MESYLATE 0.5 MG PO TABS
0.5000 mg | ORAL_TABLET | Freq: Two times a day (BID) | ORAL | Status: DC
  Administered 2018-07-29 – 2018-07-31 (×5): 0.5 mg via ORAL
  Filled 2018-07-29 (×8): qty 1

## 2018-07-29 MED ORDER — LORAZEPAM 2 MG/ML IJ SOLN
1.0000 mg | Freq: Once | INTRAMUSCULAR | Status: AC
  Administered 2018-07-29: 23:00:00 1 mg via INTRAVENOUS
  Filled 2018-07-29: qty 1

## 2018-07-29 NOTE — ED Notes (Signed)
Dr Lara Mulch was calle about the pt dbg of 179  Order d/c the d5 1/2 saline  nss only

## 2018-07-29 NOTE — ED Notes (Signed)
Admitting MD paged, awaiting call.

## 2018-07-29 NOTE — ED Notes (Signed)
Pt grandfather Marland Kitchen [phone]

## 2018-07-29 NOTE — ED Notes (Addendum)
ED TO INPATIENT HANDOFF REPORT  ED Nurse Name and Phone #: Nigel Mormon [phone]  S Name/Age/Gender Debbie Dorsey 48 y.o. female Room/Bed: RESUSC/RESUSC  Code Status   Code Status: Full Code  Home/SNF/Other Home Patient oriented to: self and place Is this baseline? No   Triage Complete: Triage complete  Chief Complaint IVC  Triage Note The pt was brought in by gpd called to grandparents for ivc pt afraid minimal speech   Allergies No Known Allergies  Level of Care/Admitting Diagnosis ED Disposition    ED Disposition Condition [place]: [place] [100100]  Level of Care: Telemetry Medical [104]  I expect the patient will be discharged within 24 hours: No (not a candidate for 5C-Observation unit)  Covid Evaluation: Screening Protocol (No Symptoms)  Diagnosis: DKA (diabetic ketoacidoses) ([place]) [035009]  Admitting Physician: Rise Patience [phone]  Attending Physician: Rise Patience [email or web address]  PT Class (Do Not Modify): Observation [104]  PT Acc Code (Do Not Modify): Observation [10022]       B Medical/Surgery History Past Medical History:  Diagnosis Date  . Diabetes mellitus without complication ([place])    History reviewed. No pertinent surgical history.   A IV Location/Drains/Wounds Patient Lines/Drains/Airways Status   Active Line/Drains/Airways    Name:   Placement date:   Placement time:   Site:   Days:   Peripheral IV 07/29/18 Right Hand   07/29/18    0100    Hand   less than 1   Peripheral IV 07/29/18 Left Antecubital   07/29/18    0215    Antecubital   less than 1          Intake/Output Last 24 hours  Intake/Output Summary (Last 24 hours) at 07/29/2018 1134 Last data filed at 07/29/2018 0400 Gross per 24 hour  Intake 1000 ml  Output -  Net 1000 ml    Labs/Imaging Results for orders placed or performed during the hospital encounter of 07/28/18 (from the past 48 hour(s))  Comprehensive  metabolic panel     Status: Abnormal   Collection Time: 07/28/18  9:37 PM  Result Value Ref Range   Sodium 140 135 - 145 mmol/L   Potassium 4.7 3.5 - 5.1 mmol/L   Chloride 108 98 - 111 mmol/L   CO2 19 (L) 22 - 32 mmol/L   Glucose, Bld 427 (H) 70 - 99 mg/dL   BUN 19 6 - 20 mg/dL   Creatinine, Ser 1.33 (H) 0.44 - 1.00 mg/dL   Calcium 10.0 8.9 - 10.3 mg/dL   Total Protein 7.0 6.5 - 8.1 g/dL   Albumin 4.0 3.5 - 5.0 g/dL   AST 41 15 - 41 U/L   ALT 31 0 - 44 U/L   Alkaline Phosphatase 49 38 - 126 U/L   Total Bilirubin 0.9 0.3 - 1.2 mg/dL   GFR calc non Af Amer 48 (L) >60 mL/min   GFR calc Af Amer 55 (L) >60 mL/min   Anion gap 13 5 - 15    Comment: Performed at [facility], [address], [place], [place] [address]  Ethanol     Status: None   Collection Time: 07/28/18  9:37 PM  Result Value Ref Range   Alcohol, Ethyl (B) <10 <10 mg/dL    Comment: (NOTE) Lowest detectable limit for serum alcohol is 10 mg/dL. For medical purposes only. Performed at [facility], [place] [address] [place], [place] [address]  CBC with Diff     Status: None   Collection Time: 07/28/18  9:37 PM  Result Value Ref Range   WBC 9.5 4.0 - 10.5 K/uL   RBC 3.91 3.87 - 5.11 MIL/uL   Hemoglobin 12.5 12.0 - 15.0 g/dL   HCT 38.8 36.0 - 46.0 %   MCV 99.2 80.0 - 100.0 fL   MCH 32.0 26.0 - 34.0 pg   MCHC 32.2 30.0 - 36.0 g/dL   RDW 13.7 11.5 - 15.5 %   Platelets 340 150 - 400 K/uL   nRBC 0.0 0.0 - 0.2 %   Neutrophils Relative % 74 %   Neutro Abs 6.9 1.7 - 7.7 K/uL   Lymphocytes Relative 21 %   Lymphs Abs 2.0 0.7 - 4.0 K/uL   Monocytes Relative 5 %   Monocytes Absolute 0.5 0.1 - 1.0 K/uL   Eosinophils Relative 0 %   Eosinophils Absolute 0.0 0.0 - 0.5 K/uL   Basophils Relative 0 %   Basophils Absolute 0.0 0.0 - 0.1 K/uL   Immature Granulocytes 0 %   Abs Immature Granulocytes 0.01 0.00 - 0.07 K/uL    Comment: Performed at [facility], [address], [place], [place] [address]  I-Stat  beta hCG blood, ED     Status: None   Collection Time: 07/28/18  9:40 PM  Result Value Ref Range   I-stat hCG, quantitative <5.0 <5 mIU/mL   Comment 3            Comment:   GEST. AGE      CONC.  (mIU/mL)   <=1 WEEK        5 - 50     2 WEEKS       50 - 500     3 WEEKS       100 - 10,000     4 WEEKS     1,000 - 30,000        FEMALE AND NON-PREGNANT FEMALE:     LESS THAN 5 mIU/mL   Urine rapid drug screen (hosp performed)     Status: None   Collection Time: 07/28/18 10:46 PM  Result Value Ref Range   Opiates NONE DETECTED NONE DETECTED   Cocaine NONE DETECTED NONE DETECTED   Benzodiazepines NONE DETECTED NONE DETECTED   Amphetamines NONE DETECTED NONE DETECTED   Tetrahydrocannabinol NONE DETECTED NONE DETECTED   Barbiturates NONE DETECTED NONE DETECTED    Comment: (NOTE) DRUG SCREEN FOR MEDICAL PURPOSES ONLY.  IF CONFIRMATION IS NEEDED FOR ANY PURPOSE, NOTIFY LAB WITHIN 5 DAYS. LOWEST DETECTABLE LIMITS FOR URINE DRUG SCREEN Drug Class                     Cutoff (ng/mL) Amphetamine and metabolites    1000 Barbiturate and metabolites    200 Benzodiazepine                 604 Tricyclics and metabolites     300 Opiates and metabolites        300 Cocaine and metabolites        300 THC                            50 Performed at [facility], [place] [address], [place], [place] [address]   Urinalysis, Routine w reflex microscopic     Status: Abnormal   Collection Time: 07/28/18 10:47 PM  Result Value Ref  Range   Color, Urine YELLOW YELLOW   APPearance HAZY (A) CLEAR   Specific Gravity, Urine 1.032 (H) 1.005 - 1.030   pH 5.0 5.0 - 8.0   Glucose, UA >=500 (A) NEGATIVE mg/dL   Hgb urine dipstick LARGE (A) NEGATIVE   Bilirubin Urine NEGATIVE NEGATIVE   Ketones, ur 80 (A) NEGATIVE mg/dL   Protein, ur 30 (A) NEGATIVE mg/dL   Nitrite NEGATIVE NEGATIVE   Leukocytes,Ua NEGATIVE NEGATIVE   RBC / HPF 11-20 0 - 5 RBC/hpf   WBC, UA 0-5 0 - 5 WBC/hpf   Bacteria, UA NONE SEEN NONE  SEEN   Squamous Epithelial / LPF 0-5 0 - 5   Mucus PRESENT    Hyaline Casts, UA PRESENT     Comment: Performed at [facility] [address], [place], [place] [address]  CBG monitoring, ED     Status: Abnormal   Collection Time: 07/28/18 11:42 PM  Result Value Ref Range   Glucose-Capillary 317 (H) 70 - 99 mg/dL  CBG monitoring, ED     Status: Abnormal   Collection Time: 07/29/18  1:04 AM  Result Value Ref Range   Glucose-Capillary 267 (H) 70 - 99 mg/dL  CBG monitoring, ED     Status: Abnormal   Collection Time: 07/29/18  2:04 AM  Result Value Ref Range   Glucose-Capillary 159 (H) 70 - 99 mg/dL  CBC     Status: Abnormal   Collection Time: 07/29/18  2:36 AM  Result Value Ref Range   WBC 7.0 4.0 - 10.5 K/uL   RBC 3.31 (L) 3.87 - 5.11 MIL/uL   Hemoglobin 10.6 (L) 12.0 - 15.0 g/dL   HCT 32.8 (L) 36.0 - 46.0 %   MCV 99.1 80.0 - 100.0 fL   MCH 32.0 26.0 - 34.0 pg   MCHC 32.3 30.0 - 36.0 g/dL   RDW 13.6 11.5 - 15.5 %   Platelets 289 150 - 400 K/uL   nRBC 0.0 0.0 - 0.2 %    Comment: Performed at [facility], [place] [address], [place], [place] [address]  Basic metabolic panel     Status: Abnormal   Collection Time: 07/29/18  2:36 AM  Result Value Ref Range   Sodium 138 135 - 145 mmol/L   Potassium 3.4 (L) 3.5 - 5.1 mmol/L   Chloride 110 98 - 111 mmol/L   CO2 22 22 - 32 mmol/L   Glucose, Bld 473 (H) 70 - 99 mg/dL   BUN 16 6 - 20 mg/dL   Creatinine, Ser 0.83 0.44 - 1.00 mg/dL   Calcium 8.5 (L) 8.9 - 10.3 mg/dL   GFR calc non Af Amer >60 >60 mL/min   GFR calc Af Amer >60 >60 mL/min   Anion gap 6 5 - 15    Comment: Performed at [facility], [place] [address], [place], [place] [address]  CBG monitoring, ED     Status: Abnormal   Collection Time: 07/29/18  3:02 AM  Result Value Ref Range   Glucose-Capillary 132 (H) 70 - 99 mg/dL  CBG monitoring, ED     Status: Abnormal   Collection Time: 07/29/18  4:05 AM  Result Value Ref Range   Glucose-Capillary 167 (H) 70 -  99 mg/dL  Basic metabolic panel     Status: Abnormal   Collection Time: 07/29/18  4:18 AM  Result Value Ref Range   Sodium 142 135 - 145 mmol/L   Potassium 4.3 3.5 - 5.1  mmol/L   Chloride 111 98 - 111 mmol/L   CO2 24 22 - 32 mmol/L   Glucose, Bld 152 (H) 70 - 99 mg/dL   BUN 15 6 - 20 mg/dL   Creatinine, Ser 0.86 0.44 - 1.00 mg/dL   Calcium 8.9 8.9 - 10.3 mg/dL   GFR calc non Af Amer >60 >60 mL/min   GFR calc Af Amer >60 >60 mL/min   Anion gap 7 5 - 15    Comment: Performed at [facility] [address] [place], [place] [address]  CBG monitoring, ED     Status: Abnormal   Collection Time: 07/29/18  5:05 AM  Result Value Ref Range   Glucose-Capillary 178 (H) 70 - 99 mg/dL  CBG monitoring, ED     Status: Abnormal   Collection Time: 07/29/18  6:35 AM  Result Value Ref Range   Glucose-Capillary 206 (H) 70 - 99 mg/dL  CBG monitoring, ED     Status: Abnormal   Collection Time: 07/29/18  7:45 AM  Result Value Ref Range   Glucose-Capillary 208 (H) 70 - 99 mg/dL  Basic metabolic panel     Status: Abnormal   Collection Time: 07/29/18  9:47 AM  Result Value Ref Range   Sodium 141 135 - 145 mmol/L   Potassium 3.7 3.5 - 5.1 mmol/L   Chloride 111 98 - 111 mmol/L   CO2 19 (L) 22 - 32 mmol/L   Glucose, Bld 212 (H) 70 - 99 mg/dL   BUN 13 6 - 20 mg/dL   Creatinine, Ser 0.83 0.44 - 1.00 mg/dL   Calcium 8.8 (L) 8.9 - 10.3 mg/dL   GFR calc non Af Amer >60 >60 mL/min   GFR calc Af Amer >60 >60 mL/min   Anion gap 11 5 - 15    Comment: Performed at [facility], [place] [address] [place], [place] [address]  Troponin I - Once     Status: None   Collection Time: 07/29/18  9:47 AM  Result Value Ref Range   Troponin I <0.03 <0.03 ng/mL    Comment: Performed at [facility] [address] [place], [place] [address]   No results found.  Pending Labs Unresulted Labs (From admission, onward)    Start     Ordered   07/30/18 0500  CBC with Differential/Platelet  Daily,   R      07/29/18 1018   07/30/18 0500  Comprehensive metabolic panel  Daily,   R     07/29/18 1018   07/30/18 0500  Magnesium  Tomorrow morning,   R     07/29/18 1018   07/29/18 0530  Hemoglobin A1c  Once,   STAT    Comments: To assess prior glycemic control    07/29/18 0529   07/28/18 2345  Novel Coronavirus,NAA,(SEND-OUT TO REF LAB - TAT 24-48 hrs); Hosp Order  (Asymptomatic Patients Labs)  ONCE - STAT,   STAT    Question:  Rule Out  Answer:  Yes   07/28/18 2344          Vitals/Pain Today's Vitals   07/29/18 0700 07/29/18 0959 07/29/18 1030 07/29/18 1054  BP: 105/66   95/67  Pulse: 88 (!) 105 (!) 102 100  Resp: 18 18  18   Temp:      SpO2: 99% 99% 98% 100%  Weight:      Height:      PainSc:    Asleep    Isolation Precautions  No active isolations  Medications Medications  simvastatin (ZOCOR) tablet 40 mg (has no administration in time range)  risperiDONE (RISPERDAL) tablet 0.5 mg (0.5 mg Oral Given 07/29/18 0821)  benztropine (COGENTIN) tablet 0.5 mg (0.5 mg Oral Given 07/29/18 0821)  0.9 %  sodium chloride infusion ( Intravenous New Bag/Given 07/29/18 0800)  insulin aspart (novoLOG) injection 0-9 Units (3 Units Subcutaneous Given 07/29/18 0758)  insulin glargine (LANTUS) injection 15 Units (15 Units Subcutaneous Given 07/29/18 1052)  haloperidol lactate (HALDOL) injection 5 mg (5 mg Intravenous Given 07/29/18 0754)  sodium chloride 0.9 % bolus 1,000 mL (0 mLs Intravenous Stopped 07/29/18 0400)  haloperidol lactate (HALDOL) injection 5 mg (5 mg Intramuscular Given 07/29/18 0004)  LORazepam (ATIVAN) injection 5 mg (2 mg Intravenous Given 07/29/18 0958)    Mobility walks Low fall risk   Focused Assessments Cardiac Assessment Handoff:    Lab Results  Component Value Date   OLMBEML 544 (H) 05/27/2018   TROPONINI <0.03 07/29/2018   Lab Results  Component Value Date   DDIMER 9.19 (H) 04/06/2018   Does the Patient currently have chest pain? No     R Recommendations:  See Admitting Provider Note  Report given to: Ailene Ravel, RN 6 Central  Additional Notes:  Patient is IVC'd for schizophrenic, paranoid behavior - uncooperative when awake for the most part, hardly redirectable without medication on board. Will not take PO medications willingly - had to put in applesauce for her to take morning meds. Currently sleeping comfortably, vital signs stable. Sitter at bedside.

## 2018-07-29 NOTE — ED Notes (Signed)
Pt did take meds in applesauce

## 2018-07-29 NOTE — ED Notes (Signed)
Admitting dr paged regarding pt's status- still requiring [employer] and security and sitter at bedside to ensure patient safety with staying in bed.

## 2018-07-29 NOTE — ED Notes (Signed)
Behavioral Health called regarding pscyhiatric consult - patient not redirectable and per previous primary RN, patient was unable to complete TTS because "she was not making any sense." [facility] to speak with Dr. Mariea Clonts.

## 2018-07-29 NOTE — ED Notes (Signed)
Pt is sleeping at present.

## 2018-07-29 NOTE — H&P (Signed)
History and Physical    Debbie Dorsey JSE:[id number] DOB: 05-20-1970 DOA: 07/28/2018  PCP: Prince Solian, MD  Patient coming from: Home.  Chief Complaint: Trying to jump out of the car.  History obtained from patient's granddaughter.  HPI: Debbie Dorsey is a 48 y.o. female with history of diabetes mellitus type 2, paranoid schizophrenia was brought to the ER after patient was trying to jump out of the running car while patient was with the family.  Patient he was in the passenger seat.  Per patient's granddaughter who provided the history patient has been getting more agitated and restless last 1 week and had gone to a psychiatrist last week and had some medications adjusted.  Including patient was given trazodone for sleeping which patient has been taking but has not been sleeping well.  Prior to this patient has not shown any suicidal ideation.  Has not overdosed with any medication and has been taking her insulin and her antipsychotic medications.  Per family patient Xarelto was discontinued last month by primary care physician.  Has had previous history of PE.  ED Course: In the ER patient was not communicative and was trying to resist any communication.  Was involuntarily committed.  Lab work done showed blood sugar of 427 bicarb 19 creatinine 1.3 which is increased from baseline anion gap of 13 and urine showing a lot of ketones all of which shows features concerning for early DKA.  Patient was started on IV fluid bolus IV insulin infusion.  For her agitation patient was given 1 dose of IV Haldol.  EKG shows sinus tachycardia with nonspecific ST-T changes.  Review of Systems: As per HPI, rest all negative.   Past Medical History:  Diagnosis Date  . Diabetes mellitus without complication ([facility])     History reviewed. No pertinent surgical history.   reports that she has never smoked. She has never used smokeless tobacco. She reports that she does not drink alcohol or use  drugs.  No Known Allergies  Family History  Problem Relation Age of Onset  . Mental illness Sister     Prior to Admission medications   Medication Sig Start Date End Date Taking? Authorizing Provider  benztropine (COGENTIN) 0.5 MG tablet Take 1 tablet (0.5 mg total) by mouth 2 (two) times daily. Restart on 06/07/2018 as per psychiatry 06/07/18   Aline August, MD  Multiple Vitamins-Minerals (MULTIVITAL) CHEW Chew 2 each by mouth daily.    [provider]  risperiDONE (RISPERDAL) 0.5 MG tablet Take 1 tablet (0.5 mg total) by mouth 2 (two) times daily. Start on 06/07/2018 as per psychiatry 06/07/18   Aline August, MD  Rivaroxaban 15 & 20 MG TBPK Take as directed on package: Start with one 15mg  tablet by mouth twice a day with food. On Day 22, switch to one 20mg  tablet once a day with food. 05/31/18   Aline August, MD  simvastatin (ZOCOR) 40 MG tablet Take 40 mg by mouth daily at 12 noon.     [provider]  temazepam (RESTORIL) 15 MG capsule Take 1 capsule (15 mg total) by mouth at bedtime as needed for sleep. 04/09/18   Johnn Hai, MD    Physical Exam: Vitals:   07/28/18 2059 07/28/18 2102  BP: 133/65   Pulse: 63   Resp: 18   Temp: 98.2 F (36.8 C)   SpO2: 97%   Weight:  43.1 kg  Height:  4\' 11"  (1.499 m)      Constitutional: Moderately  built and nourished. Vitals:   07/28/18 2059 07/28/18 2102  BP: 133/65   Pulse: 63   Resp: 18   Temp: 98.2 F (36.8 C)   SpO2: 97%   Weight:  43.1 kg  Height:  4\' 11"  (1.499 m)   Eyes: Anicteric no pallor. ENMT: No discharge from the ears eyes nose or mouth. Neck: No mass felt.  No neck rigidity. Respiratory: No rhonchi or crepitations. Cardiovascular: S1-S2 heard. Abdomen: Soft nontender bowel sounds present. Musculoskeletal: No edema.  No joint effusion. Skin: No rash. Neurologic: Patient is not cooperating and does not want to answer any questions to me. Psychiatric: Patient does not want to answer any questions.   Resists exam.   Labs on Admission: I have personally reviewed following labs and imaging studies  CBC: Recent Labs  Lab 07/28/18 2137  WBC 9.5  NEUTROABS 6.9  HGB 12.5  HCT 38.8  MCV 99.2  PLT 562   Basic Metabolic Panel: Recent Labs  Lab 07/28/18 2137  NA 140  K 4.7  CL 108  CO2 19*  GLUCOSE 427*  BUN 19  CREATININE 1.33*  CALCIUM 10.0   GFR: Estimated Creatinine Clearance: 36 mL/min (A) (by C-G formula based on SCr of 1.33 mg/dL (H)). Liver Function Tests: Recent Labs  Lab 07/28/18 2137  AST 41  ALT 31  ALKPHOS 49  BILITOT 0.9  PROT 7.0  ALBUMIN 4.0   No results for input(s): LIPASE, AMYLASE in the last 168 hours. No results for input(s): AMMONIA in the last 168 hours. Coagulation Profile: No results for input(s): INR, PROTIME in the last 168 hours. Cardiac Enzymes: No results for input(s): CKTOTAL, CKMB, CKMBINDEX, TROPONINI in the last 168 hours. BNP (last 3 results) No results for input(s): PROBNP in the last 8760 hours. HbA1C: No results for input(s): HGBA1C in the last 72 hours. CBG: Recent Labs  Lab 07/28/18 2342  GLUCAP 317*   Lipid Profile: No results for input(s): CHOL, HDL, LDLCALC, TRIG, CHOLHDL, LDLDIRECT in the last 72 hours. Thyroid Function Tests: No results for input(s): TSH, T4TOTAL, FREET4, T3FREE, THYROIDAB in the last 72 hours. Anemia Panel: No results for input(s): VITAMINB12, FOLATE, FERRITIN, TIBC, IRON, RETICCTPCT in the last 72 hours. Urine analysis:    Component Value Date/Time   COLORURINE YELLOW 07/28/2018 2247   APPEARANCEUR HAZY (A) 07/28/2018 2247   LABSPEC 1.032 (H) 07/28/2018 2247   PHURINE 5.0 07/28/2018 2247   GLUCOSEU >=500 (A) 07/28/2018 2247   HGBUR LARGE (A) 07/28/2018 [place] 07/28/2018 2247   KETONESUR 80 (A) 07/28/2018 2247   PROTEINUR 30 (A) 07/28/2018 2247   NITRITE NEGATIVE 07/28/2018 2247   LEUKOCYTESUR NEGATIVE 07/28/2018 2247   Sepsis Labs:  @LABRCNTIP (procalcitonin:4,lacticidven:4) )No results found for this or any previous visit (from the past 240 hour(s)).   Radiological Exams on Admission: No results found.  EKG: Independently reviewed.  Sinus tachycardia with nonspecific ST-T changes.  Assessment/Plan Principal Problem:   DKA (diabetic ketoacidoses) ([facility]) Active Problems:   Paranoid schizophrenia ([place])   Psychosis ([place])   ARF (acute renal failure) ([place])    1. Diabetic ketoacidosis -patient presentation is consistent with early DKA.  Patient's granddaughter states that patient has been taking her medications.  Continue with the insulin infusion until anion gap gets corrected.  Check hemoglobin A1c.  IV fluids. 2. Paranoid schizophrenia presently with suicidal ideation.  On Risperdal Cogentin and was given 1 dose of IV Haldol.  Given the suicidal ideation may need psychiatry consult.  Suicide precautions. 3. Acute renal failure likely from dehydration -we will continue hydration follow metabolic panel. 4. History of PE off Xarelto last month as per the patient's granddaughter.   DVT prophylaxis: Lovenox. Code Status: Full code. Family Communication: Patient's granddaughter. Disposition Plan: To be determined. Consults called: None. Admission status: Observation.   Rise Patience MD [facility] Pager [phone].  If 7PM-7AM, please contact night-coverage [email or web address] Password [facility]  07/29/2018, 12:51 AM

## 2018-07-29 NOTE — Consult Note (Signed)
Went to see patient for assessment but patient was sedated.  Nurse reported that patient had received some medications when in the emergency department.  Patient was not able to be aroused for any type of conversation.  Would recommend that patient be seen again tomorrow to see if she is more alert and oriented for an evaluation.

## 2018-07-29 NOTE — ED Notes (Addendum)
Pt given haldol so that she can be sedated so that we can start an iv and  Start an insulin drip for her elevated blood sugar.  Three people had to hold her down to give her the shor

## 2018-07-29 NOTE — Consult Note (Signed)
TTS consult was requested for "medication recommendations."  However was reported patient is "too acute" to be seen via tele-assessment.  Was reported patient has been agitated since earlier this morning and just received Geodon and Ativan.  Reported patient is a lot calmer.  RN reported patient has antipsychotic medications on file however has refused medications on night shift.  Patient to be transferred for to medical floor. -Consider consult for psychiatry follow-up.   -Continue agitation protocol -Continue Resporal 0.5 mg p.o. twice daily Continue Cogentin 0.5 twice daily

## 2018-07-29 NOTE — ED Notes (Addendum)
Patient now sleeping comfortably. Sitter remains at bedside. Patient's grandfather called and updated on her status. Notified him of all safety precautions we are taking to ensure patient is safe and comfortable.

## 2018-07-29 NOTE — ED Notes (Signed)
Pt standing up in bed, will not take PO meds willingly, will attempt to place in food, requiring 2 gpd officers and security and sitter to keep in the bed. Pt is continually moving, is not redirectable, will inform MD

## 2018-07-29 NOTE — ED Notes (Addendum)
Admitting MD paged regarding patient's current condition - patient uncooperative, despite Haldol being given. She refuses to lay in bed, standing every couple of minutes, and pulls off leads and pulse ox. [employer], security, and sitter remain at bedside.

## 2018-07-29 NOTE — ED Notes (Signed)
Carb modified lunch tray ordered 

## 2018-07-29 NOTE — Progress Notes (Signed)
Pt arrived to unit, withdrawals from pain. No eve opening or verbal response. MD notified. VSS

## 2018-07-29 NOTE — ED Notes (Signed)
SDU  Breakfast ordered, carb mod per RN Gerald Stabs; pt diabetic made cafeteria aware

## 2018-07-29 NOTE — Progress Notes (Addendum)
PROGRESS NOTE    Debbie Dorsey  OJJ:[id number] DOB: 09-07-1970 DOA: 07/28/2018 PCP: Prince Solian, MD   Brief Narrative: Debbie Dorsey is a 48 y.o. female with history of diabetes mellitus type 2, paranoid schizophrenia was brought to the ER after patient was trying to jump out of the running car while patient was with the family.  Patient he was in the passenger seat.  Per patient's granddaughter who provided the history patient has been getting more agitated and restless last 1 week and had gone to a psychiatrist last week and had some medications adjusted.  Including patient was given trazodone for sleeping which patient has been taking but has not been sleeping well.  Prior to this patient has not shown any suicidal ideation.  Has not overdosed with any medication and has been taking her insulin and her antipsychotic medications.  Per family patient Xarelto was discontinued last month by primary care physician.  Has had previous history of PE. Patient was involuntarily committed.  Lab work showed DKA.  She was started on insulin drip as well as IV fluids and her DKA soon resolved and she has been off of the insulin drip since about 3 AM this morning.   Consultants:   Psychiatry  Procedures:   None  Antimicrobials:   None   Subjective: Patient seen and examined.  Prior to seeing the patient, I received couple of calls that patient was significantly agitated patient received 1 dose of Haldol 5 mg x 1 which did not help so she was given 2 mg of Ativan and after that patient fell asleep and she was sleeping when I entered the room.  There was a sitter in the room as well.  Objective: Vitals:   07/29/18 0430 07/29/18 0530 07/29/18 0700 07/29/18 0959  BP: 96/60 (!) 91/50 105/66   Pulse: 89 (!) 102 88 (!) 105  Resp: 16 15 18 18   Temp:      SpO2: 99% 100% 99% 99%  Weight:      Height:        Intake/Output Summary (Last 24 hours) at 07/29/2018 1012 Last data filed at  07/29/2018 0400 Gross per 24 hour  Intake 1000 ml  Output --  Net 1000 ml   Filed Weights   07/28/18 2102  Weight: 43.1 kg    Examination:  General exam: Sleepy Respiratory system: Clear to auscultation. Respiratory effort normal. Cardiovascular system: S1 & S2 heard, RRR. No JVD, murmurs, rubs, gallops or clicks. No pedal edema. Gastrointestinal system: Abdomen is nondistended, soft and nontender. No organomegaly or masses felt. Normal bowel sounds heard. Central nervous system: Sleepy Extremities: Sleepy Skin: No rashes, lesions or ulcers Psychiatry: Sleepy   Data Reviewed: I have personally reviewed following labs and imaging studies  CBC: Recent Labs  Lab 07/28/18 2137 07/29/18 0236  WBC 9.5 7.0  NEUTROABS 6.9  --   HGB 12.5 10.6*  HCT 38.8 32.8*  MCV 99.2 99.1  PLT 340 937   Basic Metabolic Panel: Recent Labs  Lab 07/28/18 2137 07/29/18 0236 07/29/18 0418  NA 140 138 142  K 4.7 3.4* 4.3  CL 108 110 111  CO2 19* 22 24  GLUCOSE 427* 473* 152*  BUN 19 16 15   CREATININE 1.33* 0.83 0.86  CALCIUM 10.0 8.5* 8.9   GFR: Estimated Creatinine Clearance: 55.6 mL/min (by C-G formula based on SCr of 0.86 mg/dL). Liver Function Tests: Recent Labs  Lab 07/28/18 2137  AST 41  ALT 31  ALKPHOS  49  BILITOT 0.9  PROT 7.0  ALBUMIN 4.0   No results for input(s): LIPASE, AMYLASE in the last 168 hours. No results for input(s): AMMONIA in the last 168 hours. Coagulation Profile: No results for input(s): INR, PROTIME in the last 168 hours. Cardiac Enzymes: No results for input(s): CKTOTAL, CKMB, CKMBINDEX, TROPONINI in the last 168 hours. BNP (last 3 results) No results for input(s): PROBNP in the last 8760 hours. HbA1C: No results for input(s): HGBA1C in the last 72 hours. CBG: Recent Labs  Lab 07/29/18 0302 07/29/18 0405 07/29/18 0505 07/29/18 0635 07/29/18 0745  GLUCAP 132* 167* 178* 206* 208*   Lipid Profile: No results for input(s): CHOL, HDL,  LDLCALC, TRIG, CHOLHDL, LDLDIRECT in the last 72 hours. Thyroid Function Tests: No results for input(s): TSH, T4TOTAL, FREET4, T3FREE, THYROIDAB in the last 72 hours. Anemia Panel: No results for input(s): VITAMINB12, FOLATE, FERRITIN, TIBC, IRON, RETICCTPCT in the last 72 hours. Sepsis Labs: No results for input(s): PROCALCITON, LATICACIDVEN in the last 168 hours.  No results found for this or any previous visit (from the past 240 hour(s)).    Radiology Studies: No results found.  Scheduled Meds:  benztropine  0.5 mg Oral BID   enoxaparin (LOVENOX) injection  30 mg Subcutaneous Daily   insulin aspart  0-9 Units Subcutaneous Q4H   insulin glargine  15 Units Subcutaneous Daily   risperiDONE  0.5 mg Oral BID   simvastatin  40 mg Oral Q1200   Continuous Infusions:  sodium chloride 125 mL/hr at 07/29/18 0800     LOS: 0 days   Assessment & Plan:   Principal Problem:   DKA (diabetic ketoacidoses) ([place]) Active Problems:   Paranoid schizophrenia ([place])   Psychosis ([place])   ARF (acute renal failure) ([place])  DKA and type 2 diabetes mellitus: DKA resolved.  Continue Lantus 15 units and sliding scale insulin.  Paranoid schizophrenia with suicidal ideation: She was given a dose of Risperdal and Haldol in the emergency department.  She received more Haldol as well as Ativan today.  I have consulted psychiatry.  Acute kidney injury: Likely secondary to dehydration.  Resolved.  Continue IV hydration.  History of PE: Off of Xarelto for past 1 month per family's request.  Hypokalemia: Resolved.  DVT prophylaxis: Lovenox Code Status: Full code Family Communication: None present at bedside Disposition Plan: To be determined based on psychiatry recommendations and clinical progress.   Time spent: 29 minutes   Darliss Cheney, MD [facility] Pager [phone]  If 7PM-7AM, please contact night-coverage [email or web address] Password [email or web address] 07/29/2018, 10:12 AM

## 2018-07-29 NOTE — ED Notes (Signed)
Attempted TTS.

## 2018-07-30 DIAGNOSIS — F209 Schizophrenia, unspecified: Secondary | ICD-10-CM | POA: Diagnosis present

## 2018-07-30 LAB — CBC WITH DIFFERENTIAL/PLATELET
Abs Immature Granulocytes: 0.01 10*3/uL (ref 0.00–0.07)
Basophils Absolute: 0 10*3/uL (ref 0.0–0.1)
Basophils Relative: 1 %
Eosinophils Absolute: 0.1 10*3/uL (ref 0.0–0.5)
Eosinophils Relative: 2 %
HCT: 34 % — ABNORMAL LOW (ref 36.0–46.0)
Hemoglobin: 11 g/dL — ABNORMAL LOW (ref 12.0–15.0)
Immature Granulocytes: 0 %
Lymphocytes Relative: 30 %
Lymphs Abs: 1.5 10*3/uL (ref 0.7–4.0)
MCH: 31.7 pg (ref 26.0–34.0)
MCHC: 32.4 g/dL (ref 30.0–36.0)
MCV: 98 fL (ref 80.0–100.0)
Monocytes Absolute: 0.3 10*3/uL (ref 0.1–1.0)
Monocytes Relative: 6 %
Neutro Abs: 2.9 10*3/uL (ref 1.7–7.7)
Neutrophils Relative %: 61 %
Platelets: 260 10*3/uL (ref 150–400)
RBC: 3.47 MIL/uL — ABNORMAL LOW (ref 3.87–5.11)
RDW: 13.4 % (ref 11.5–15.5)
WBC: 4.9 10*3/uL (ref 4.0–10.5)
nRBC: 0 % (ref 0.0–0.2)

## 2018-07-30 LAB — HEMOGLOBIN A1C
Hgb A1c MFr Bld: 8.5 % — ABNORMAL HIGH (ref 4.8–5.6)
Mean Plasma Glucose: 197 mg/dL

## 2018-07-30 LAB — COMPREHENSIVE METABOLIC PANEL
ALT: 32 U/L (ref 0–44)
AST: 44 U/L — ABNORMAL HIGH (ref 15–41)
Albumin: 2.8 g/dL — ABNORMAL LOW (ref 3.5–5.0)
Alkaline Phosphatase: 42 U/L (ref 38–126)
Anion gap: 5 (ref 5–15)
BUN: 6 mg/dL (ref 6–20)
CO2: 23 mmol/L (ref 22–32)
Calcium: 8.5 mg/dL — ABNORMAL LOW (ref 8.9–10.3)
Chloride: 112 mmol/L — ABNORMAL HIGH (ref 98–111)
Creatinine, Ser: 0.65 mg/dL (ref 0.44–1.00)
GFR calc Af Amer: >60 mL/min (ref >60)
GFR calc non Af Amer: >60 mL/min (ref >60)
Glucose, Bld: 128 mg/dL — ABNORMAL HIGH (ref 70–99)
Potassium: 3.5 mmol/L (ref 3.5–5.1)
Sodium: 140 mmol/L (ref 135–145)
Total Bilirubin: 0.4 mg/dL (ref 0.3–1.2)
Total Protein: 5 g/dL — ABNORMAL LOW (ref 6.5–8.1)

## 2018-07-30 LAB — NOVEL CORONAVIRUS, NAA (HOSP ORDER, SEND-OUT TO REF LAB; TAT 18-24 HRS): SARS-CoV-2, NAA: NOT DETECTED

## 2018-07-30 LAB — GLUCOSE, CAPILLARY
Glucose-Capillary: 129 mg/dL — ABNORMAL HIGH (ref 70–99)
Glucose-Capillary: 131 mg/dL — ABNORMAL HIGH (ref 70–99)
Glucose-Capillary: 193 mg/dL — ABNORMAL HIGH (ref 70–99)
Glucose-Capillary: 210 mg/dL — ABNORMAL HIGH (ref 70–99)
Glucose-Capillary: 232 mg/dL — ABNORMAL HIGH (ref 70–99)
Glucose-Capillary: 236 mg/dL — ABNORMAL HIGH (ref 70–99)

## 2018-07-30 LAB — RAPID URINE DRUG SCREEN, HOSP PERFORMED
Amphetamines: NOT DETECTED
Barbiturates: NOT DETECTED
Benzodiazepines: NOT DETECTED
Cocaine: NOT DETECTED
Opiates: NOT DETECTED
Tetrahydrocannabinol: NOT DETECTED

## 2018-07-30 LAB — MAGNESIUM: Magnesium: 1.8 mg/dL (ref 1.7–2.4)

## 2018-07-30 MED ORDER — RISPERIDONE 1 MG PO TABS
1.0000 mg | ORAL_TABLET | Freq: Two times a day (BID) | ORAL | Status: DC
  Administered 2018-07-30 – 2018-07-31 (×2): 1 mg via ORAL
  Filled 2018-07-30 (×3): qty 1

## 2018-07-30 MED ORDER — TRAZODONE HCL 50 MG PO TABS: 50.0000 mg | ORAL_TABLET | Freq: Every evening | ORAL | Status: DC | PRN

## 2018-07-30 MED ORDER — LORAZEPAM 0.5 MG PO TABS
0.5000 mg | ORAL_TABLET | Freq: Once | ORAL | Status: AC
  Administered 2018-07-30: 0.5 mg via ORAL
  Filled 2018-07-30: qty 1

## 2018-07-30 MED ORDER — HALOPERIDOL LACTATE 5 MG/ML IJ SOLN
2.0000 mg | Freq: Four times a day (QID) | INTRAMUSCULAR | Status: DC | PRN
  Administered 2018-07-30 (×2): 2 mg via INTRAVENOUS
  Filled 2018-07-30 (×5): qty 0.4

## 2018-07-30 MED ORDER — SIMVASTATIN 20 MG PO TABS: 40.0000 mg | ORAL_TABLET | Freq: Every day | ORAL | Status: DC

## 2018-07-30 MED ORDER — INSULIN ASPART 100 UNIT/ML ~~LOC~~ SOLN: 0.0000 [IU] | Freq: Three times a day (TID) | SUBCUTANEOUS | Status: DC

## 2018-07-30 MED ORDER — ZIPRASIDONE MESYLATE 20 MG IM SOLR
10.0000 mg | Freq: Four times a day (QID) | INTRAMUSCULAR | Status: DC | PRN
  Filled 2018-07-30: qty 20

## 2018-07-30 MED ORDER — ACETAMINOPHEN 325 MG PO TABS: 650.0000 mg | ORAL_TABLET | Freq: Four times a day (QID) | ORAL | Status: DC | PRN

## 2018-07-30 MED ORDER — INSULIN ASPART 100 UNIT/ML ~~LOC~~ SOLN: 0.0000 [IU] | Freq: Every day | SUBCUTANEOUS | Status: DC

## 2018-07-30 MED ORDER — HYDROXYZINE HCL 25 MG PO TABS: 25.0000 mg | ORAL_TABLET | Freq: Three times a day (TID) | ORAL | Status: DC | PRN

## 2018-07-30 MED ORDER — INSULIN GLARGINE 100 UNIT/ML ~~LOC~~ SOLN: 15.0000 [IU] | Freq: Every day | SUBCUTANEOUS | Status: DC

## 2018-07-30 MED ORDER — ALUM & MAG HYDROXIDE-SIMETH 200-200-20 MG/5ML PO SUSP: 30.0000 mL | ORAL | Status: DC | PRN

## 2018-07-30 MED ORDER — MAGNESIUM HYDROXIDE 400 MG/5ML PO SUSP: 30.0000 mL | Freq: Every day | ORAL | Status: DC | PRN

## 2018-07-30 NOTE — Progress Notes (Signed)
Pt was noted to be very lethargic at beginning of shift; 1:1 sitter checked 2000 CBG and informed RN that it was 17. 1 amp D50 obtained, on call provider notified of CBG & request for order for D50 obtained. Pt given 1/2 amp while 1:1 sitter awakened pt and assisted her with her meal. Pt very groggy initially after waking up, but the more she ate, the more active and alert she became. Pt CBG rechecks are as follows:   2029: 64 2054: 76 2225: 203   By 2200, pt was increasingly agitated, attempting to jump out of bed, then pt had an incontinent episode. While 2 NT's attempted to clean her up & change her linens, pt continued her impulsiveness- not combative, but very resistant to their help. Pt refused to stay in bed and was becoming panicky and did begin to kick at staff, resisting efforts to receive IV medication until NT's assisted with holding pt & attempting to de-escalate pt's anxiety. Pt did willingly swallow scheduled PO meds at the time pt's haldol was administered. At 2215 Pt's PRN haldol administered. Medication had no effect at this time; on call provider notified, orders received. 1 mg IV ativan to be given, but when RN attempted to administer, pt's IV noted to be bent in half and out of pt's vein. IVF removed from that site and placed on opposite arm, left arm wrapped to protect remaining IV. Pt did not receive any of the attempted dose of ativan so 2nd dose pulled from pyxis and administered as per order at 2304. By 2345, pt was calm and beginning to close her eyes to sleep. 0000 CBG obtained, telemetry replaced, & SSC administered. Pt now resting quietly with eyes closed, RR even and unlabored.

## 2018-07-30 NOTE — Consult Note (Signed)
Telepsych Consultation   Reason for Consult:  psychosis Referring Physician:  Hal Hope Location of Patient:  Location of Provider: [facility]  Patient Identification: Debbie Dorsey MRN:  [id number] Principal Diagnosis: DKA (diabetic ketoacidoses) ([place]) Diagnosis:  Principal Problem:   DKA (diabetic ketoacidoses) ([place]) Active Problems:   Paranoid schizophrenia ([place])   Psychosis ([place])   ARF (acute renal failure) ([place])   Total Time spent with patient: 30 minutes  Subjective:   Debbie Dorsey is a 48 y.o. female patient admitted with schizophrenia.  HPI:  Patient is seen and examined. Patient is a 48 YO female with a PPHx significant for schizophrenia who presented on 6/22 under IVC. The patient is a poor historian and most of information is collected from old chart. Patient was brought to ED by family after developing "erratic behavior". She apparently attempted to jump out of the car on her way to the hospital.  Unfortunately her blood sugar when seen in the ED was over 400. Her grandfather and police had done the initial IVC. It is fairly clear that she had been non-compliant with both psych and diabetic medications. The patient was examined by video today and appeared to be significantly paranoid and responding to internal stimuli. She kept trying to look under the bed for "something". Her last psych admission to our facility was 04/19/18. At that time she had been treated with risperdal 0.5 mg BID as well as temazepam and cogentin. She is currently on 0.5 mg BID of risperdal and apparently may have had the risperdal consta injection.She is currently psychotic, paranoid and responding to auditory hallucinations.  Past Psychiatric History: Patient has had multiple psychiatric hospitalizations with the last in March of this year. She has been most recently treated with the above medications.   Risk to Self:   Risk to Others:   Prior Inpatient Therapy:   Prior  Outpatient Therapy:    Past Medical History:  Past Medical History:  Diagnosis Date  . Diabetes mellitus without complication ([place])    History reviewed. No pertinent surgical history. Family History:  Family History  Problem Relation Age of Onset  . Mental illness Sister    Family Psychiatric  History: non-contributory Social History:  Social History   Substance and Sexual Activity  Alcohol Use No     Social History   Substance and Sexual Activity  Drug Use No    Social History   Socioeconomic History  . Marital status: Single    Spouse name: Not on file  . Number of children: Not on file  . Years of education: Not on file  . Highest education level: Not on file  Occupational History  . Not on file  Social Needs  . Financial resource strain: Not on file  . Food insecurity    Worry: Not on file    Inability: Not on file  . Transportation needs    Medical: Not on file    Non-medical: Not on file  Tobacco Use  . Smoking status: Never Smoker  . Smokeless tobacco: Never Used  Substance and Sexual Activity  . Alcohol use: No  . Drug use: No  . Sexual activity: Not Currently  Lifestyle  . Physical activity    Days per week: Not on file    Minutes per session: Not on file  . Stress: Not on file  Relationships  . Social Herbalist on phone: Not on file    Gets together: Not on file  Attends religious service: Not on file    Active member of club or organization: Not on file    Attends meetings of clubs or organizations: Not on file    Relationship status: Not on file  Other Topics Concern  . Not on file  Social History Narrative  . Not on file   Additional Social History:    Allergies:  No Known Allergies  Labs:  Results for orders placed or performed during the hospital encounter of 07/28/18 (from the past 48 hour(s))  Comprehensive metabolic panel     Status: Abnormal   Collection Time: 07/28/18  9:37 PM  Result Value Ref Range    Sodium 140 135 - 145 mmol/L   Potassium 4.7 3.5 - 5.1 mmol/L   Chloride 108 98 - 111 mmol/L   CO2 19 (L) 22 - 32 mmol/L   Glucose, Bld 427 (H) 70 - 99 mg/dL   BUN 19 6 - 20 mg/dL   Creatinine, Ser 1.33 (H) 0.44 - 1.00 mg/dL   Calcium 10.0 8.9 - 10.3 mg/dL   Total Protein 7.0 6.5 - 8.1 g/dL   Albumin 4.0 3.5 - 5.0 g/dL   AST 41 15 - 41 U/L   ALT 31 0 - 44 U/L   Alkaline Phosphatase 49 38 - 126 U/L   Total Bilirubin 0.9 0.3 - 1.2 mg/dL   GFR calc non Af Amer 48 (L) >60 mL/min   GFR calc Af Amer 55 (L) >60 mL/min   Anion gap 13 5 - 15    Comment: Performed at [facility], [address], [place], [place] [address]  Ethanol     Status: None   Collection Time: 07/28/18  9:37 PM  Result Value Ref Range   Alcohol, Ethyl (B) <10 <10 mg/dL    Comment: (NOTE) Lowest detectable limit for serum alcohol is 10 mg/dL. For medical purposes only. Performed at [facility], [place] [address] [place], [place] [address]   CBC with Diff     Status: None   Collection Time: 07/28/18  9:37 PM  Result Value Ref Range   WBC 9.5 4.0 - 10.5 K/uL   RBC 3.91 3.87 - 5.11 MIL/uL   Hemoglobin 12.5 12.0 - 15.0 g/dL   HCT 38.8 36.0 - 46.0 %   MCV 99.2 80.0 - 100.0 fL   MCH 32.0 26.0 - 34.0 pg   MCHC 32.2 30.0 - 36.0 g/dL   RDW 13.7 11.5 - 15.5 %   Platelets 340 150 - 400 K/uL   nRBC 0.0 0.0 - 0.2 %   Neutrophils Relative % 74 %   Neutro Abs 6.9 1.7 - 7.7 K/uL   Lymphocytes Relative 21 %   Lymphs Abs 2.0 0.7 - 4.0 K/uL   Monocytes Relative 5 %   Monocytes Absolute 0.5 0.1 - 1.0 K/uL   Eosinophils Relative 0 %   Eosinophils Absolute 0.0 0.0 - 0.5 K/uL   Basophils Relative 0 %   Basophils Absolute 0.0 0.0 - 0.1 K/uL   Immature Granulocytes 0 %   Abs Immature Granulocytes 0.01 0.00 - 0.07 K/uL    Comment: Performed at [facility], [address] [place], [place] [address]  I-Stat beta hCG blood, ED     Status: None   Collection Time: 07/28/18  9:40 PM  Result Value Ref Range    I-stat hCG, quantitative <5.0 <5 mIU/mL   Comment 3  Comment:   GEST. AGE      CONC.  (mIU/mL)   <=1 WEEK        5 - 50     2 WEEKS       50 - 500     3 WEEKS       100 - 10,000     4 WEEKS     1,000 - 30,000        FEMALE AND NON-PREGNANT FEMALE:     LESS THAN 5 mIU/mL   Urine rapid drug screen (hosp performed)     Status: None   Collection Time: 07/28/18 10:46 PM  Result Value Ref Range   Opiates NONE DETECTED NONE DETECTED   Cocaine NONE DETECTED NONE DETECTED   Benzodiazepines NONE DETECTED NONE DETECTED   Amphetamines NONE DETECTED NONE DETECTED   Tetrahydrocannabinol NONE DETECTED NONE DETECTED   Barbiturates NONE DETECTED NONE DETECTED    Comment: (NOTE) DRUG SCREEN FOR MEDICAL PURPOSES ONLY.  IF CONFIRMATION IS NEEDED FOR ANY PURPOSE, NOTIFY LAB WITHIN 5 DAYS. LOWEST DETECTABLE LIMITS FOR URINE DRUG SCREEN Drug Class                     Cutoff (ng/mL) Amphetamine and metabolites    1000 Barbiturate and metabolites    200 Benzodiazepine                 458 Tricyclics and metabolites     300 Opiates and metabolites        300 Cocaine and metabolites        300 THC                            50 Performed at [facility], [place] [address], [place], [place] [address]   Urinalysis, Routine w reflex microscopic     Status: Abnormal   Collection Time: 07/28/18 10:47 PM  Result Value Ref Range   Color, Urine YELLOW YELLOW   APPearance HAZY (A) CLEAR   Specific Gravity, Urine 1.032 (H) 1.005 - 1.030   pH 5.0 5.0 - 8.0   Glucose, UA >=500 (A) NEGATIVE mg/dL   Hgb urine dipstick LARGE (A) NEGATIVE   Bilirubin Urine NEGATIVE NEGATIVE   Ketones, ur 80 (A) NEGATIVE mg/dL   Protein, ur 30 (A) NEGATIVE mg/dL   Nitrite NEGATIVE NEGATIVE   Leukocytes,Ua NEGATIVE NEGATIVE   RBC / HPF 11-20 0 - 5 RBC/hpf   WBC, UA 0-5 0 - 5 WBC/hpf   Bacteria, UA NONE SEEN NONE SEEN   Squamous Epithelial / LPF 0-5 0 - 5   Mucus PRESENT    Hyaline Casts, UA PRESENT     Comment:  Performed at [facility], [place] [address] [place], [place] [address]  CBG monitoring, ED     Status: Abnormal   Collection Time: 07/28/18 11:42 PM  Result Value Ref Range   Glucose-Capillary 317 (H) 70 - 99 mg/dL  CBG monitoring, ED     Status: Abnormal   Collection Time: 07/29/18  1:04 AM  Result Value Ref Range   Glucose-Capillary 267 (H) 70 - 99 mg/dL  CBG monitoring, ED     Status: Abnormal   Collection Time: 07/29/18  2:04 AM  Result Value Ref Range   Glucose-Capillary 159 (H) 70 - 99 mg/dL  CBC     Status: Abnormal   Collection Time: 07/29/18  2:36 AM  Result Value Ref  Range   WBC 7.0 4.0 - 10.5 K/uL   RBC 3.31 (L) 3.87 - 5.11 MIL/uL   Hemoglobin 10.6 (L) 12.0 - 15.0 g/dL   HCT 32.8 (L) 36.0 - 46.0 %   MCV 99.1 80.0 - 100.0 fL   MCH 32.0 26.0 - 34.0 pg   MCHC 32.3 30.0 - 36.0 g/dL   RDW 13.6 11.5 - 15.5 %   Platelets 289 150 - 400 K/uL   nRBC 0.0 0.0 - 0.2 %    Comment: Performed at [facility], [place] [address], [place], [place] [address]  Basic metabolic panel     Status: Abnormal   Collection Time: 07/29/18  2:36 AM  Result Value Ref Range   Sodium 138 135 - 145 mmol/L   Potassium 3.4 (L) 3.5 - 5.1 mmol/L   Chloride 110 98 - 111 mmol/L   CO2 22 22 - 32 mmol/L   Glucose, Bld 473 (H) 70 - 99 mg/dL   BUN 16 6 - 20 mg/dL   Creatinine, Ser 0.83 0.44 - 1.00 mg/dL   Calcium 8.5 (L) 8.9 - 10.3 mg/dL   GFR calc non Af Amer >60 >60 mL/min   GFR calc Af Amer >60 >60 mL/min   Anion gap 6 5 - 15    Comment: Performed at [facility], [place] [address], [place], [place] [address]  CBG monitoring, ED     Status: Abnormal   Collection Time: 07/29/18  3:02 AM  Result Value Ref Range   Glucose-Capillary 132 (H) 70 - 99 mg/dL  CBG monitoring, ED     Status: Abnormal   Collection Time: 07/29/18  4:05 AM  Result Value Ref Range   Glucose-Capillary 167 (H) 70 - 99 mg/dL  Basic metabolic panel     Status: Abnormal   Collection Time: 07/29/18  4:18 AM  Result Value  Ref Range   Sodium 142 135 - 145 mmol/L   Potassium 4.3 3.5 - 5.1 mmol/L   Chloride 111 98 - 111 mmol/L   CO2 24 22 - 32 mmol/L   Glucose, Bld 152 (H) 70 - 99 mg/dL   BUN 15 6 - 20 mg/dL   Creatinine, Ser 0.86 0.44 - 1.00 mg/dL   Calcium 8.9 8.9 - 10.3 mg/dL   GFR calc non Af Amer >60 >60 mL/min   GFR calc Af Amer >60 >60 mL/min   Anion gap 7 5 - 15    Comment: Performed at [facility], [place] [address], [place], [place] [address]  CBG monitoring, ED     Status: Abnormal   Collection Time: 07/29/18  5:05 AM  Result Value Ref Range   Glucose-Capillary 178 (H) 70 - 99 mg/dL  CBG monitoring, ED     Status: Abnormal   Collection Time: 07/29/18  6:35 AM  Result Value Ref Range   Glucose-Capillary 206 (H) 70 - 99 mg/dL  CBG monitoring, ED     Status: Abnormal   Collection Time: 07/29/18  7:45 AM  Result Value Ref Range   Glucose-Capillary 208 (H) 70 - 99 mg/dL  Basic metabolic panel     Status: Abnormal   Collection Time: 07/29/18  9:47 AM  Result Value Ref Range   Sodium 141 135 - 145 mmol/L   Potassium 3.7 3.5 - 5.1 mmol/L   Chloride 111 98 - 111 mmol/L   CO2 19 (L) 22 - 32 mmol/L   Glucose, Bld 212 (H) 70 - 99 mg/dL  BUN 13 6 - 20 mg/dL   Creatinine, Ser 0.83 0.44 - 1.00 mg/dL   Calcium 8.8 (L) 8.9 - 10.3 mg/dL   GFR calc non Af Amer >60 >60 mL/min   GFR calc Af Amer >60 >60 mL/min   Anion gap 11 5 - 15    Comment: Performed at [facility] [address] [place], [place] [address]  Hemoglobin A1c     Status: Abnormal   Collection Time: 07/29/18  9:47 AM  Result Value Ref Range   Hgb A1c MFr Bld 8.5 (H) 4.8 - 5.6 %    Comment: (NOTE)         Prediabetes: 5.7 - 6.4         Diabetes: >6.4         Glycemic control for adults with diabetes: <7.0    Mean Plasma Glucose 197 mg/dL    Comment: (NOTE) Performed At: [facility] [place], [place] [id number] Rush Farmer MD [id number]   Troponin I - Once     Status: None   Collection  Time: 07/29/18  9:47 AM  Result Value Ref Range   Troponin I <0.03 <0.03 ng/mL    Comment: Performed at [facility], [place] [address], [place], [place] [address]  Glucose, capillary     Status: Abnormal   Collection Time: 07/29/18 12:39 PM  Result Value Ref Range   Glucose-Capillary 220 (H) 70 - 99 mg/dL   Comment 1 Notify RN   Glucose, capillary     Status: Abnormal   Collection Time: 07/29/18  4:00 PM  Result Value Ref Range   Glucose-Capillary 112 (H) 70 - 99 mg/dL  Glucose, capillary     Status: Abnormal   Collection Time: 07/29/18  8:09 PM  Result Value Ref Range   Glucose-Capillary 17 (LL) 70 - 99 mg/dL  Glucose, capillary     Status: Abnormal   Collection Time: 07/29/18  8:29 PM  Result Value Ref Range   Glucose-Capillary 64 (L) 70 - 99 mg/dL  Glucose, capillary     Status: None   Collection Time: 07/29/18  8:54 PM  Result Value Ref Range   Glucose-Capillary 76 70 - 99 mg/dL  Glucose, capillary     Status: Abnormal   Collection Time: 07/29/18 10:25 PM  Result Value Ref Range   Glucose-Capillary 203 (H) 70 - 99 mg/dL  Glucose, capillary     Status: Abnormal   Collection Time: 07/29/18 11:41 PM  Result Value Ref Range   Glucose-Capillary 257 (H) 70 - 99 mg/dL  Glucose, capillary     Status: Abnormal   Collection Time: 07/30/18  4:15 AM  Result Value Ref Range   Glucose-Capillary 131 (H) 70 - 99 mg/dL  CBC with Differential/Platelet     Status: Abnormal   Collection Time: 07/30/18  7:39 AM  Result Value Ref Range   WBC 4.9 4.0 - 10.5 K/uL   RBC 3.47 (L) 3.87 - 5.11 MIL/uL   Hemoglobin 11.0 (L) 12.0 - 15.0 g/dL   HCT 34.0 (L) 36.0 - 46.0 %   MCV 98.0 80.0 - 100.0 fL   MCH 31.7 26.0 - 34.0 pg   MCHC 32.4 30.0 - 36.0 g/dL   RDW 13.4 11.5 - 15.5 %   Platelets 260 150 - 400 K/uL   nRBC 0.0 0.0 - 0.2 %   Neutrophils Relative % 61 %   Neutro Abs 2.9 1.7 - 7.7 K/uL   Lymphocytes Relative 30 %  Lymphs Abs 1.5 0.7 - 4.0 K/uL   Monocytes Relative 6 %   Monocytes  Absolute 0.3 0.1 - 1.0 K/uL   Eosinophils Relative 2 %   Eosinophils Absolute 0.1 0.0 - 0.5 K/uL   Basophils Relative 1 %   Basophils Absolute 0.0 0.0 - 0.1 K/uL   Immature Granulocytes 0 %   Abs Immature Granulocytes 0.01 0.00 - 0.07 K/uL    Comment: Performed at [facility], [place] [address] [place], [place] [address]  Comprehensive metabolic panel     Status: Abnormal   Collection Time: 07/30/18  7:39 AM  Result Value Ref Range   Sodium 140 135 - 145 mmol/L   Potassium 3.5 3.5 - 5.1 mmol/L   Chloride 112 (H) 98 - 111 mmol/L   CO2 23 22 - 32 mmol/L   Glucose, Bld 128 (H) 70 - 99 mg/dL   BUN 6 6 - 20 mg/dL   Creatinine, Ser 0.65 0.44 - 1.00 mg/dL   Calcium 8.5 (L) 8.9 - 10.3 mg/dL   Total Protein 5.0 (L) 6.5 - 8.1 g/dL   Albumin 2.8 (L) 3.5 - 5.0 g/dL   AST 44 (H) 15 - 41 U/L   ALT 32 0 - 44 U/L   Alkaline Phosphatase 42 38 - 126 U/L   Total Bilirubin 0.4 0.3 - 1.2 mg/dL   GFR calc non Af Amer >60 >60 mL/min   GFR calc Af Amer >60 >60 mL/min   Anion gap 5 5 - 15    Comment: Performed at [facility], [place] [address], [place], [place] [address]  Magnesium     Status: None   Collection Time: 07/30/18  7:39 AM  Result Value Ref Range   Magnesium 1.8 1.7 - 2.4 mg/dL    Comment: Performed at [place] [address] [place], [place] [address]  Glucose, capillary     Status: Abnormal   Collection Time: 07/30/18  7:52 AM  Result Value Ref Range   Glucose-Capillary 129 (H) 70 - 99 mg/dL  Glucose, capillary     Status: Abnormal   Collection Time: 07/30/18 11:56 AM  Result Value Ref Range   Glucose-Capillary 232 (H) 70 - 99 mg/dL   Comment 1 Notify RN     Medications:  Current Facility-Administered Medications  Medication Dose Route Frequency Provider Last Rate Last Dose  . benztropine (COGENTIN) tablet 0.5 mg  0.5 mg Oral BID Rise Patience, MD   0.5 mg at 07/30/18 0831  . dextrose 50 % solution 50 mL  1 ampule Intravenous PRN Gardiner Barefoot, NP   25 mL at 07/29/18 2032  . haloperidol lactate (HALDOL) injection 2 mg  2 mg Intravenous Q6H PRN Darliss Cheney, MD   2 mg at 07/30/18 1156  . insulin aspart (novoLOG) injection 0-9 Units  0-9 Units Subcutaneous Q4H Rise Patience, MD   3 Units at 07/30/18 1202  . insulin glargine (LANTUS) injection 15 Units  15 Units Subcutaneous Daily Rise Patience, MD   15 Units at 07/30/18 0831  . risperiDONE (RISPERDAL) tablet 0.5 mg  0.5 mg Oral BID Rise Patience, MD   0.5 mg at 07/30/18 0831  . simvastatin (ZOCOR) tablet 40 mg  40 mg Oral Q1200 Rise Patience, MD   40 mg at 07/30/18 1208    Musculoskeletal: Strength & Muscle Tone: within normal limits Gait & Station: normal Patient leans: N/A  Psychiatric Specialty Exam: Physical Exam  ROS  Blood pressure 124/68, pulse 97, temperature 98 F (36.7 C), temperature source Oral, resp. rate (!) 22, height 4\' 11"  (1.499 m), weight 43.1 kg, SpO2 98 %, unknown if currently breastfeeding.Body mass index is 19.19 kg/m.  General Appearance: Disheveled  Eye Contact:  Minimal  Speech:  Normal Rate  Volume:  Decreased  Mood:  Anxious and Dysphoric  Affect:  Flat  Thought Process:  Disorganized and Descriptions of Associations: Loose  Orientation:  Negative  Thought Content:  Delusions, Hallucinations: Auditory and Paranoid Ideation  Suicidal Thoughts:  No  Homicidal Thoughts:  No  Memory:  Immediate;   Poor Recent;   Poor Remote;   Poor  Judgement:  Impaired  Insight:  Lacking  Psychomotor Activity:  Restlessness  Concentration:  Concentration: Poor and Attention Span: Poor  Recall:  Poor  Fund of Knowledge:  Poor  Language:  Poor  Akathisia:  Negative  Handed:  Right  AIMS (if indicated):     Assets:  Desire for Improvement Resilience  ADL's:  Impaired  Cognition:  Impaired,  Mild  Sleep:        Treatment Plan Summary: Daily contact with patient to assess and evaluate symptoms and progress in treatment,  Medication management and Plan : Patient is seen and examined.Patient is a 48 YO female with the above stated PPHx seen in consultation.   Patient is still currently psychotic and will require hospitalizations after medical improvement. I will increase her risperdal to 1.0 mg po BID in the short run in an attempt to improve the current situation.Her current BS was 232 today. It would be beneficial as well to repeat her UA given her DKA. Until resolved we will continue to follow with you.  1) increase risperdal to 1.0 mg po BID 2) continue prn ativan for acute agitation 3) geodon 10 mg IM q 12 hours prn severe agtiation. 4) continue haldol prn as written for agitation. 5) repeat UA 6) we will continue to follow with you.  Disposition: Recommend psychiatric Inpatient admission when medically cleared.  This service was provided via telemedicine using a 2-way, interactive audio and video technology.  Names of all persons participating in this telemedicine service and their role in this encounter. Name: Myles Lipps MD Role:Psychiatry/consultant             Sharma Covert, MD 07/30/2018 3:14 PM

## 2018-07-30 NOTE — Progress Notes (Addendum)
PROGRESS NOTE    Debbie Dorsey  EHU:[id number] DOB: 05-15-1970 DOA: 07/28/2018 PCP: Prince Solian, MD   Brief Narrative: Debbie Dorsey is a 48 y.o. female with history of diabetes mellitus type 2, paranoid schizophrenia was brought to the ER after patient was trying to jump out of the running car while patient was with the family.  Patient he was in the passenger seat.  Per patient's granddaughter who provided the history patient has been getting more agitated and restless last 1 week and had gone to a psychiatrist last week and had some medications adjusted.  Including patient was given trazodone for sleeping which patient has been taking but has not been sleeping well.  Prior to this patient has not shown any suicidal ideation.  Has not overdosed with any medication and has been taking her insulin and her antipsychotic medications.  Per family patient Xarelto was discontinued last month by primary care physician.  Has had previous history of PE. Patient was involuntarily committed.  Lab work showed DKA.  She was started on insulin drip as well as IV fluids and her DKA soon resolved and she has been off of the insulin drip since about 3 AM this morning.   Consultants:   Psychiatry  Procedures:   None  Antimicrobials:   None   Subjective: Patient seen and examined earlier.  Patient was pleasantly confused but not agitated at all.  She has been trying to get out of the bed but she has not been agitated at all.  Objective: Vitals:   07/30/18 0800 07/30/18 0801 07/30/18 1222 07/30/18 1538  BP:  110/66 124/68   Pulse:  84 97 (!) 119  Resp:  (!) 22 (!) 22 18  Temp: 97.6 F (36.4 C)  98 F (36.7 C) 98.5 F (36.9 C)  TempSrc: Axillary  Oral Oral  SpO2:  99% 98% 99%  Weight:      Height:        Intake/Output Summary (Last 24 hours) at 07/30/2018 1552 Last data filed at 07/30/2018 1222 Gross per 24 hour  Intake 3100 ml  Output -  Net 3100 ml   Filed Weights   07/28/18 2102  Weight: 43.1 kg    Examination:  General exam: Appears calm and comfortable but pleasantly confused Respiratory system: Clear to auscultation. Respiratory effort normal. Cardiovascular system: S1 & S2 heard, RRR. No JVD, murmurs, rubs, gallops or clicks. No pedal edema. Gastrointestinal system: Abdomen is nondistended, soft and nontender. No organomegaly or masses felt. Normal bowel sounds heard. Central nervous system: Alert and oriented x0. No focal neurological deficits. Extremities: Symmetric 5 x 5 power. Skin: No rashes, lesions or ulcers Psychiatry: Judgement and insight appear poor . Mood & affect flat   Data Reviewed: I have personally reviewed following labs and imaging studies  CBC: Recent Labs  Lab 07/28/18 2137 07/29/18 0236 07/30/18 0739  WBC 9.5 7.0 4.9  NEUTROABS 6.9  --  2.9  HGB 12.5 10.6* 11.0*  HCT 38.8 32.8* 34.0*  MCV 99.2 99.1 98.0  PLT 340 289 785   Basic Metabolic Panel: Recent Labs  Lab 07/28/18 2137 07/29/18 0236 07/29/18 0418 07/29/18 0947 07/30/18 0739  NA 140 138 142 141 140  K 4.7 3.4* 4.3 3.7 3.5  CL 108 110 111 111 112*  CO2 19* 22 24 19* 23  GLUCOSE 427* 473* 152* 212* 128*  BUN 19 16 15 13 6   CREATININE 1.33* 0.83 0.86 0.83 0.65  CALCIUM 10.0 8.5* 8.9 8.8*  8.5*  MG  --   --   --   --  1.8   GFR: Estimated Creatinine Clearance: 59.8 mL/min (by C-G formula based on SCr of 0.65 mg/dL). Liver Function Tests: Recent Labs  Lab 07/28/18 2137 07/30/18 0739  AST 41 44*  ALT 31 32  ALKPHOS 49 42  BILITOT 0.9 0.4  PROT 7.0 5.0*  ALBUMIN 4.0 2.8*   No results for input(s): LIPASE, AMYLASE in the last 168 hours. No results for input(s): AMMONIA in the last 168 hours. Coagulation Profile: No results for input(s): INR, PROTIME in the last 168 hours. Cardiac Enzymes: Recent Labs  Lab 07/29/18 0947  TROPONINI <0.03   BNP (last 3 results) No results for input(s): PROBNP in the last 8760 hours. HbA1C: Recent  Labs    07/29/18 0947  HGBA1C 8.5*   CBG: Recent Labs  Lab 07/29/18 2225 07/29/18 2341 07/30/18 0415 07/30/18 0752 07/30/18 1156  GLUCAP 203* 257* 131* 129* 232*   Lipid Profile: No results for input(s): CHOL, HDL, LDLCALC, TRIG, CHOLHDL, LDLDIRECT in the last 72 hours. Thyroid Function Tests: No results for input(s): TSH, T4TOTAL, FREET4, T3FREE, THYROIDAB in the last 72 hours. Anemia Panel: No results for input(s): VITAMINB12, FOLATE, FERRITIN, TIBC, IRON, RETICCTPCT in the last 72 hours. Sepsis Labs: No results for input(s): PROCALCITON, LATICACIDVEN in the last 168 hours.  Recent Results (from the past 240 hour(s))  Novel Coronavirus,NAA,(SEND-OUT TO REF LAB - TAT 24-48 hrs); Hosp Order     Status: None   Collection Time: 07/29/18  2:39 AM   Specimen: Nasopharyngeal Swab; Respiratory  Result Value Ref Range Status   SARS-CoV-2, NAA NOT DETECTED NOT DETECTED Final    Comment: (NOTE) This test was developed and its performance characteristics determined by [employer]. This test has not been FDA cleared or approved. This test has been authorized by FDA under an Emergency Use Authorization (EUA). This test is only authorized for the duration of time the declaration that circumstances exist justifying the authorization of the emergency use of in vitro diagnostic tests for detection of SARS-CoV-2 virus and/or diagnosis of COVID-19 infection under section 564(b)(1) of the Act, 21 U.S.C. 654YTK-3(T)(4), unless the authorization is terminated or revoked sooner. When diagnostic testing is negative, the possibility of a false negative result should be considered in the context of a patient's recent exposures and the presence of clinical signs and symptoms consistent with COVID-19. An individual without symptoms of COVID-19 and who is not shedding SARS-CoV-2 virus would expect to have a negative (not detected) result in this assay. Performed  At: [facility]  [address] [place], [place] [id number] Rush Farmer MD ZG:[id number]    [place]  Final    Comment: Performed at [facility], [place] [address] [place], [place] [address]      Radiology Studies: No results found.  Scheduled Meds: . benztropine  0.5 mg Oral BID  . insulin aspart  0-9 Units Subcutaneous Q4H  . insulin glargine  15 Units Subcutaneous Daily  . risperiDONE  1 mg Oral BID  . simvastatin  40 mg Oral Q1200   Continuous Infusions:    LOS: 1 day   Assessment & Plan:   Principal Problem:   DKA (diabetic ketoacidoses) ([place]) Active Problems:   Paranoid schizophrenia ([place])   Psychosis ([place])   ARF (acute renal failure) ([place])  DKA and type 2 diabetes mellitus: DKA resolved.   Blood sugar fairly controlled but labile and for that reason  I will continue her current Lantus at 15 units along with SSI.  She has been off of insulin drip for almost 36 hours now.  Paranoid schizophrenia with suicidal ideation: Psychiatry was called at around 11 AM once again to see patient so she can be assessed and possibly displaced.  I read psychiatry note dictated by Myles Lipps that she will need inpatient psychiatry admission once she is medically clear.  I called him on the phone and informed him that patient is medically cleared.  His concerns were that patient is not eating enough and that patient's blood sugar is 230 and that patient's urine ketones should be checked.  I personally do not think that blood sugar of 230 indicates checking urine ketones and neither I think that this is a sign of not being medically stable.  She is not eating well because she is psychotic and needs psychiatric care.  I will defer further decision up to psychiatry as to when to accept her as inpatient psychiatry patient.  Acute kidney injury: Likely secondary to dehydration.  Resolved.  Continue IV hydration.  History of PE: Off of Xarelto for past 1 month per family's request.   Hypokalemia: Resolved.  DVT prophylaxis: Lovenox Code Status: Full code Family Communication: None present at bedside Disposition Plan: Psychiatry does not believe she is medically stable however I do not see any sign off on the stability from medical perspective.  I have relayed my message to the psychiatry PA Myles Lipps.   Time spent: 28 minutes   Darliss Cheney, MD [facility] Pager [phone]  If 7PM-7AM, please contact night-coverage [email or web address] Password [facility] 07/30/2018, 3:52 PM

## 2018-07-30 NOTE — Progress Notes (Signed)
Spoke with Larose Kells at [facility] this evening.They are unable to accept patient tonight due her behavior.They only have semi private rooms available and patient needs a private room.Per [facility] patient can probably be able to transfer tomorrow after discharges.This nurse notified Morey Hummingbird and text paged Forrest Moron NP.

## 2018-07-30 NOTE — Progress Notes (Signed)
Pt BP's running 80's/50's; Pt sleeping soundly. CBG 131, all other VS WNL. On call provider paged, no new orders at this time.

## 2018-07-30 NOTE — Progress Notes (Signed)
[facility] informed that patient is very restless and that she received Ativan 6/22 pm which helped her sleep. Arthor Captain LPN

## 2018-07-30 NOTE — Discharge Summary (Signed)
Physician Discharge Summary  NAFEESAH LAPAGLIA JJH:[id number] DOB: Mar 19, 1970 DOA: 07/28/2018  PCP: Prince Solian, MD  Admit date: 07/28/2018 Discharge date: 07/30/2018  Admitted From: Home Disposition: Inpatient psychiatry unit  Recommendations for Outpatient Follow-up:  1. Follow up with PCP in 1-2 weeks 2. Please obtain BMP/CBC in one week 3. Please follow up on the following pending results:  Home Health: None Equipment/Devices: None  Discharge Condition: Stable CODE STATUS: Full code Diet recommendation: Regular  Subjective: Patient seen and examined earlier.  She was alert and pleasantly confused.  Brief/Interim Summary: Kieren Ricci Jacksonis a 48 y.o.femalewithhistory of diabetes mellitus type 2, paranoid schizophrenia was brought to the ER after patient was trying to jump out of the running car while patient was with the family. Patient he was in the passenger seat. Per patient's granddaughter who provided the history patient has been getting more agitated and restless last 1 week and had gone to a psychiatrist last week and had some medications adjusted. Including patient was given trazodone for sleeping which patient has been taking but has not been sleeping well. Prior to this patient has not shown any suicidal ideation. Has not overdosed with any medication and has been taking her insulin and her antipsychotic medications. Per family patient Xarelto was discontinued last month by primary care physician. Has had previous history of PE. Patient was involuntarily committed.  Lab work showed DKA.  She was started on insulin drip as well as IV fluids and her DKA soon resolved and she has been off of the insulin drip since about 36 hours now and her blood sugar has remained stable.  She was evaluated by psychiatry and accepted for inpatient psychiatric care.  She is medically cleared and thus she is going to be discharged.  PS: She can be started on Lantus 15 units in the  morning along with SSI (she did receive her Lantus this morning) and her insulin regimen can be titrated based on how much she is going to eat and her sugar levels.  Her recent blood sugar is 230 only.  This morning at 8 AM it was 129.  Discharge Diagnoses:  Principal Problem:   DKA (diabetic ketoacidoses) ([place]) Active Problems:   Paranoid schizophrenia ([place])   Psychosis ([place])   ARF (acute renal failure) ([place])   Schizophrenia [facility])    Discharge Instructions  Discharge Instructions    Discharge patient   Complete by: As directed    Discharge disposition: 65-Discharged/transferred to [facility] or [facility]   Discharge patient date: 07/30/2018     Allergies as of 07/30/2018   No Known Allergies     Medication List    TAKE these medications   benztropine 0.5 MG tablet Commonly known as: COGENTIN Take 1 tablet (0.5 mg total) by mouth 2 (two) times daily. Restart on 06/07/2018 as per psychiatry   benztropine 1 MG tablet Commonly known as: COGENTIN Take 0.5 mg by mouth 2 (two) times a day.   busPIRone 5 MG tablet Commonly known as: BUSPAR Take 5 mg by mouth 3 (three) times daily.   insulin NPH-regular Human (70-30) 100 UNIT/ML injection Inject 32 Units into the skin 2 (two) times daily with a meal.   risperiDONE 0.5 MG tablet Commonly known as: RisperDAL Take 1 tablet (0.5 mg total) by mouth 2 (two) times daily. Start on 06/07/2018 as per psychiatry   Rivaroxaban 15 MG Tabs tablet Commonly known as: XARELTO Take 15 mg by mouth 2 (two) times daily  with a meal.   Rivaroxaban 15 & 20 MG Tbpk Take as directed on package: Start with one 15mg  tablet by mouth twice a day with food. On Day 22, switch to one 20mg  tablet once a day with food.   simvastatin 40 MG tablet Commonly known as: ZOCOR Take 40 mg by mouth daily at 12 noon.   temazepam 15 MG capsule Commonly known as: RESTORIL Take 1 capsule (15 mg total) by mouth at bedtime as  needed for sleep.   traZODone 100 MG tablet Commonly known as: DESYREL Take 100 mg by mouth daily.      Follow-up Information    Avva, Ravisankar, MD Follow up in 1 week(s).   Specialty: Internal Medicine Contact information: [address] [place] [phone]          No Known Allergies  Consultations: Psychiatry   Procedures/Studies: Dg Abdomen Acute W/chest  Result Date: 07/16/2018 CLINICAL DATA:  Altered mental status over the past 4 days. EXAM: DG ABDOMEN ACUTE W/ 1V CHEST COMPARISON:  Single view of the chest and CT chest 05/25/2018. FINDINGS: Single-view of the chest demonstrates clear lungs and normal heart size. No pneumothorax or pleural effusion. Two views of the abdomen show no free intraperitoneal air. The bowel gas pattern is nonobstructive. Large volume of stool throughout the colon noted. No abnormal abdominal calcification or focal bony abnormality. IMPRESSION: No acute finding chest or abdomen. Large colonic stool burden. Electronically Signed   By: Inge Rise M.D.   On: 07/16/2018 14:40      Discharge Exam: Vitals:   07/30/18 1222 07/30/18 1538  BP: 124/68   Pulse: 97 (!) 119  Resp: (!) 22 18  Temp: 98 F (36.7 C) 98.5 F (36.9 C)  SpO2: 98% 99%   Vitals:   07/30/18 0800 07/30/18 0801 07/30/18 1222 07/30/18 1538  BP:  110/66 124/68   Pulse:  84 97 (!) 119  Resp:  (!) 22 (!) 22 18  Temp: 97.6 F (36.4 C)  98 F (36.7 C) 98.5 F (36.9 C)  TempSrc: Axillary  Oral Oral  SpO2:  99% 98% 99%  Weight:      Height:        General: Pt is alert, awake, not in acute distress but pleasantly confused Cardiovascular: RRR, S1/S2 +, no rubs, no gallops Respiratory: CTA bilaterally, no wheezing, no rhonchi Abdominal: Soft, NT, ND, bowel sounds + Extremities: no edema, no cyanosis    The results of significant diagnostics from this hospitalization (including imaging, microbiology, ancillary and laboratory) are listed below for  reference.     Microbiology: Recent Results (from the past 240 hour(s))  Novel Coronavirus,NAA,(SEND-OUT TO REF LAB - TAT 24-48 hrs); Hosp Order     Status: None   Collection Time: 07/29/18  2:39 AM   Specimen: Nasopharyngeal Swab; Respiratory  Result Value Ref Range Status   SARS-CoV-2, NAA NOT DETECTED NOT DETECTED Final    Comment: (NOTE) This test was developed and its performance characteristics determined by [employer]. This test has not been FDA cleared or approved. This test has been authorized by FDA under an Emergency Use Authorization (EUA). This test is only authorized for the duration of time the declaration that circumstances exist justifying the authorization of the emergency use of in vitro diagnostic tests for detection of SARS-CoV-2 virus and/or diagnosis of COVID-19 infection under section 564(b)(1) of the Act, 21 U.S.C. 696EXB-2(W)(4), unless the authorization is terminated or revoked sooner. When diagnostic testing is negative, the  possibility of a false negative result should be considered in the context of a patient's recent exposures and the presence of clinical signs and symptoms consistent with COVID-19. An individual without symptoms of COVID-19 and who is not shedding SARS-CoV-2 virus would expect to have a negative (not detected) result in this assay. Performed  At: [facility] [address] [place], [place] [id number] Rush Farmer MD JG:[id number]    [place]  Final    Comment: Performed at [facility], [place] [address] [place], [place] [address]     Labs: BNP (last 3 results) No results for input(s): BNP in the last 8760 hours. Basic Metabolic Panel: Recent Labs  Lab 07/28/18 2137 07/29/18 0236 07/29/18 0418 07/29/18 0947 07/30/18 0739  NA 140 138 142 141 140  K 4.7 3.4* 4.3 3.7 3.5  CL 108 110 111 111 112*  CO2 19* 22 24 19* 23  GLUCOSE 427* 473* 152* 212* 128*  BUN 19 16 15 13 6    CREATININE 1.33* 0.83 0.86 0.83 0.65  CALCIUM 10.0 8.5* 8.9 8.8* 8.5*  MG  --   --   --   --  1.8   Liver Function Tests: Recent Labs  Lab 07/28/18 2137 07/30/18 0739  AST 41 44*  ALT 31 32  ALKPHOS 49 42  BILITOT 0.9 0.4  PROT 7.0 5.0*  ALBUMIN 4.0 2.8*   No results for input(s): LIPASE, AMYLASE in the last 168 hours. No results for input(s): AMMONIA in the last 168 hours. CBC: Recent Labs  Lab 07/28/18 2137 07/29/18 0236 07/30/18 0739  WBC 9.5 7.0 4.9  NEUTROABS 6.9  --  2.9  HGB 12.5 10.6* 11.0*  HCT 38.8 32.8* 34.0*  MCV 99.2 99.1 98.0  PLT 340 289 260   Cardiac Enzymes: Recent Labs  Lab 07/29/18 0947  TROPONINI <0.03   BNP: Invalid input(s): POCBNP CBG: Recent Labs  Lab 07/29/18 2225 07/29/18 2341 07/30/18 0415 07/30/18 0752 07/30/18 1156  GLUCAP 203* 257* 131* 129* 232*   D-Dimer No results for input(s): DDIMER in the last 72 hours. Hgb A1c Recent Labs    07/29/18 0947  HGBA1C 8.5*   Lipid Profile No results for input(s): CHOL, HDL, LDLCALC, TRIG, CHOLHDL, LDLDIRECT in the last 72 hours. Thyroid function studies No results for input(s): TSH, T4TOTAL, T3FREE, THYROIDAB in the last 72 hours.  Invalid input(s): FREET3 Anemia work up No results for input(s): VITAMINB12, FOLATE, FERRITIN, TIBC, IRON, RETICCTPCT in the last 72 hours. Urinalysis    Component Value Date/Time   COLORURINE YELLOW 07/28/2018 2247   APPEARANCEUR HAZY (A) 07/28/2018 2247   LABSPEC 1.032 (H) 07/28/2018 2247   PHURINE 5.0 07/28/2018 2247   GLUCOSEU >=500 (A) 07/28/2018 2247   HGBUR LARGE (A) 07/28/2018 2247   BILIRUBINUR NEGATIVE 07/28/2018 2247   KETONESUR 80 (A) 07/28/2018 2247   PROTEINUR 30 (A) 07/28/2018 2247   NITRITE NEGATIVE 07/28/2018 2247   LEUKOCYTESUR NEGATIVE 07/28/2018 2247   Sepsis Labs Invalid input(s): PROCALCITONIN,  WBC,  LACTICIDVEN Microbiology Recent Results (from the past 240 hour(s))  Novel Coronavirus,NAA,(SEND-OUT TO REF LAB - TAT  24-48 hrs); Hosp Order     Status: None   Collection Time: 07/29/18  2:39 AM   Specimen: Nasopharyngeal Swab; Respiratory  Result Value Ref Range Status   SARS-CoV-2, NAA NOT DETECTED NOT DETECTED Final    Comment: (NOTE) This test was developed and its performance characteristics determined by [employer]. This test has not been FDA cleared or approved.  This test has been authorized by FDA under an Emergency Use Authorization (EUA). This test is only authorized for the duration of time the declaration that circumstances exist justifying the authorization of the emergency use of in vitro diagnostic tests for detection of SARS-CoV-2 virus and/or diagnosis of COVID-19 infection under section 564(b)(1) of the Act, 21 U.S.C. 937DSK-8(J)(6), unless the authorization is terminated or revoked sooner. When diagnostic testing is negative, the possibility of a false negative result should be considered in the context of a patient's recent exposures and the presence of clinical signs and symptoms consistent with COVID-19. An individual without symptoms of COVID-19 and who is not shedding SARS-CoV-2 virus would expect to have a negative (not detected) result in this assay. Performed  At: [facility] [address] [place], [place] [id number] Rush Farmer MD BT:[id number]    [place]  Final    Comment: Performed at [facility], [place] [address] [place], [place] [address]     Time coordinating discharge: 35 minutes  SIGNED:   Darliss Cheney, MD  [facility] 07/30/2018, 4:24 PM Pager [phone]  If 7PM-7AM, please contact night-coverage [email or web address] Password [email or web address]

## 2018-07-30 NOTE — Progress Notes (Signed)
CSW updated [facility], [name], to let her know that Urine Drug Screen has been ordered. She requests to go ahead and get the patient's DC Summary and a copy of the IVC in case drug screen comes back and she can transfer today.   Percell Locus Yui Mulvaney LCSW [phone]

## 2018-07-31 ENCOUNTER — Inpatient Hospital Stay (HOSPITAL_COMMUNITY)
Admission: AD | Admit: 2018-07-31 | Discharge: 2018-08-28 | DRG: 885 | Disposition: A | Discharge status: Home / Self Care | Payer: Medicare Other | Source: Intra-hospital | Attending: Psychiatry | Admitting: Psychiatry

## 2018-07-31 ENCOUNTER — Encounter (HOSPITAL_COMMUNITY): Payer: Self-pay

## 2018-07-31 ENCOUNTER — Other Ambulatory Visit: Payer: Self-pay

## 2018-07-31 DIAGNOSIS — Z79899 Other long term (current) drug therapy: Secondary | ICD-10-CM

## 2018-07-31 DIAGNOSIS — Z9114 Patient's other noncompliance with medication regimen: Secondary | ICD-10-CM | POA: Diagnosis not present

## 2018-07-31 DIAGNOSIS — E1165 Type 2 diabetes mellitus with hyperglycemia: Secondary | ICD-10-CM

## 2018-07-31 DIAGNOSIS — M797 Fibromyalgia: Secondary | ICD-10-CM | POA: Diagnosis present

## 2018-07-31 DIAGNOSIS — F419 Anxiety disorder, unspecified: Secondary | ICD-10-CM | POA: Diagnosis present

## 2018-07-31 DIAGNOSIS — F29 Unspecified psychosis not due to a substance or known physiological condition: Secondary | ICD-10-CM | POA: Diagnosis not present

## 2018-07-31 DIAGNOSIS — F200 Paranoid schizophrenia: Secondary | ICD-10-CM

## 2018-07-31 DIAGNOSIS — Z794 Long term (current) use of insulin: Secondary | ICD-10-CM

## 2018-07-31 DIAGNOSIS — F209 Schizophrenia, unspecified: Principal | ICD-10-CM | POA: Diagnosis present

## 2018-07-31 DIAGNOSIS — F940 Selective mutism: Secondary | ICD-10-CM | POA: Diagnosis present

## 2018-07-31 DIAGNOSIS — Z818 Family history of other mental and behavioral disorders: Secondary | ICD-10-CM

## 2018-07-31 DIAGNOSIS — Z7901 Long term (current) use of anticoagulants: Secondary | ICD-10-CM

## 2018-07-31 DIAGNOSIS — I959 Hypotension, unspecified: Secondary | ICD-10-CM | POA: Diagnosis present

## 2018-07-31 DIAGNOSIS — R000 Tachycardia, unspecified: Secondary | ICD-10-CM | POA: Diagnosis present

## 2018-07-31 DIAGNOSIS — E119 Type 2 diabetes mellitus without complications: Secondary | ICD-10-CM | POA: Diagnosis present

## 2018-07-31 DIAGNOSIS — R4189 Other symptoms and signs involving cognitive functions and awareness: Secondary | ICD-10-CM | POA: Diagnosis not present

## 2018-07-31 LAB — GLUCOSE, CAPILLARY
Glucose-Capillary: 125 mg/dL — ABNORMAL HIGH (ref 70–99)
Glucose-Capillary: 110 mg/dL — ABNORMAL HIGH (ref 70–99)
Glucose-Capillary: 293 mg/dL — ABNORMAL HIGH (ref 70–99)
Glucose-Capillary: 252 mg/dL — ABNORMAL HIGH (ref 70–99)
Glucose-Capillary: 211 mg/dL — ABNORMAL HIGH (ref 70–99)

## 2018-07-31 MED ORDER — ZIPRASIDONE MESYLATE 20 MG IM SOLR
10.0000 mg | Freq: Four times a day (QID) | INTRAMUSCULAR | Status: DC | PRN
  Administered 2018-08-07 – 2018-08-25 (×8): 10 mg via INTRAMUSCULAR
  Filled 2018-07-31 (×7): qty 20

## 2018-07-31 MED ORDER — RISPERIDONE 1 MG PO TABS
1.0000 mg | ORAL_TABLET | Freq: Two times a day (BID) | ORAL | Status: DC
  Administered 2018-07-31: 18:00:00 1 mg via ORAL
  Filled 2018-07-31 (×7): qty 1

## 2018-07-31 MED ORDER — INSULIN ASPART 100 UNIT/ML ~~LOC~~ SOLN
0.0000 [IU] | Freq: Three times a day (TID) | SUBCUTANEOUS | Status: DC
  Administered 2018-07-31: 5 [IU] via SUBCUTANEOUS
  Administered 2018-08-01: 18:00:00 7 [IU] via SUBCUTANEOUS
  Administered 2018-08-02 (×2): 9 [IU] via SUBCUTANEOUS
  Administered 2018-08-02: 5 [IU] via SUBCUTANEOUS

## 2018-07-31 MED ORDER — SIMVASTATIN 20 MG PO TABS
40.0000 mg | ORAL_TABLET | Freq: Every day | ORAL | Status: DC
  Administered 2018-07-31 – 2018-08-27 (×28): 40 mg via ORAL
  Filled 2018-07-31: qty 1
  Filled 2018-07-31: qty 2
  Filled 2018-07-31 (×6): qty 1
  Filled 2018-07-31: qty 2
  Filled 2018-07-31: qty 1
  Filled 2018-07-31 (×3): qty 2
  Filled 2018-07-31: qty 1
  Filled 2018-07-31: qty 2
  Filled 2018-07-31 (×13): qty 1
  Filled 2018-07-31: qty 2
  Filled 2018-07-31 (×3): qty 1

## 2018-07-31 MED ORDER — HYDROXYZINE HCL 25 MG PO TABS
25.0000 mg | ORAL_TABLET | Freq: Three times a day (TID) | ORAL | Status: DC | PRN
  Administered 2018-07-31 – 2018-08-27 (×25): 25 mg via ORAL
  Filled 2018-07-31 (×23): qty 1

## 2018-07-31 MED ORDER — ACETAMINOPHEN 325 MG PO TABS: 650.0000 mg | ORAL_TABLET | Freq: Four times a day (QID) | ORAL | Status: DC | PRN

## 2018-07-31 MED ORDER — TRAZODONE HCL 50 MG PO TABS
50.0000 mg | ORAL_TABLET | Freq: Every evening | ORAL | Status: DC | PRN
  Administered 2018-07-31 – 2018-08-27 (×21): 50 mg via ORAL
  Filled 2018-07-31: qty 4
  Filled 2018-07-31 (×15): qty 1

## 2018-07-31 MED ORDER — MAGNESIUM HYDROXIDE 400 MG/5ML PO SUSP: 30.0000 mL | Freq: Every day | ORAL | Status: DC | PRN

## 2018-07-31 MED ORDER — ALUM & MAG HYDROXIDE-SIMETH 200-200-20 MG/5ML PO SUSP: 30.0000 mL | ORAL | Status: DC | PRN

## 2018-07-31 MED ORDER — INSULIN GLARGINE 100 UNIT/ML ~~LOC~~ SOLN
15.0000 [IU] | Freq: Every day | SUBCUTANEOUS | Status: DC
  Administered 2018-07-31 – 2018-08-03 (×4): 15 [IU] via SUBCUTANEOUS

## 2018-07-31 NOTE — Progress Notes (Signed)
PROGRESS NOTE                                                                                                                                                                                                             Patient Demographics:    Debbie Dorsey, is a 48 y.o. female, DOB - 1970-05-18, LPF:[id number]  Admit date - 07/28/2018   Admitting Physician Rise Patience, MD  Outpatient Primary MD for the patient is Avva, [name], MD  LOS - 2  Outpatient Specialists: None  Chief Complaint  Patient presents with  . Psychiatric Evaluation       Brief Narrative 48 year old female with type 2 diabetes mellitus, uncontrolled, paranoid schizophrenia brought to the ED after she was trying to jump out of the running car while she was on the passenger seat.Marland Kitchen  Per her granddaughter she was more restless and agitated for past 1 week with her medication adjusted. Reportedly has been taking her insulin antipsychotic meds.  Reportedly her Xarelto was also discontinued 1 month back by her PCP (was on it for history of PE). In the ED she was found to be in DKA and admitted on insulin drip.  Psychiatry consulted.    Subjective:   Patient anxious and poorly communicative.  Noted for soft blood pressure this morning but has improved without intervention.   Assessment  & Plan :    Principal Problem:   DKA (diabetic ketoacidoses) (HCC) A1c of 8.5.  DKA resolved on insulin drip.  CBG currently stable.  Previously was on Humalog which was held when she was hospitalized back in April for episodes of low blood glucose.  I suppose insulin was resumed at home. Now placed on Lantus 15 units daily with sliding scale coverage.  Active Problems: History of pulmonary embolism Was diagnosed in February.  Reportedly was discontinued by her PCP last month.    Paranoid schizophrenia with acute psychosis ([place]) Continue Cogentin and  Risperdal.  PRN Geodon and Haldol for agitation. [employer].  Psych recommends be HH once medically stable.   Acute kidney injury Secondary to dehydration with DKA.  Resolved with fluids.  Hyperlipidemia Continue statin.     Code Status : Full code  Family Communication  : None at bedside  Disposition Plan  : Transfer to [facility]    Consults  :  Psychiatry  Procedures  : None  DVT Prophylaxis  :  Lovenox -   Lab Results  Component Value Date   PLT 260 07/30/2018    Antibiotics  :    Anti-infectives (From admission, onward)   None        Objective:   Vitals:   07/31/18 0407 07/31/18 0759 07/31/18 0800 07/31/18 1124  BP: (!) 86/61 98/70 98/70  114/73  Pulse: 94 (!) 112 (!) 117 98  Resp: 13 20    Temp: 97.8 F (36.6 C) 97.8 F (36.6 C)  97.7 F (36.5 C)  TempSrc: Axillary Oral  Oral  SpO2: 100% 98% 99% 99%  Weight:      Height:        Wt Readings from Last 3 Encounters:  07/28/18 43.1 kg  05/30/18 43.1 kg  04/20/18 45.4 kg     Intake/Output Summary (Last 24 hours) at 07/31/2018 1418 Last data filed at 07/31/2018 1222 Gross per 24 hour  Intake 712 ml  Output -  Net 712 ml     Physical Exam  Gen: not in distress, anxious, poorly communicative HEENT:  moist mucosa, supple neck Chest: clear b/l, no added sounds CVS: N S1&S2, no murmurs GI: soft, NT, ND, Musculoskeletal: warm, no edema     Data Review:    CBC Recent Labs  Lab 07/28/18 2137 07/29/18 0236 07/30/18 0739  WBC 9.5 7.0 4.9  HGB 12.5 10.6* 11.0*  HCT 38.8 32.8* 34.0*  PLT 340 289 260  MCV 99.2 99.1 98.0  MCH 32.0 32.0 31.7  MCHC 32.2 32.3 32.4  RDW 13.7 13.6 13.4  LYMPHSABS 2.0  --  1.5  MONOABS 0.5  --  0.3  EOSABS 0.0  --  0.1  BASOSABS 0.0  --  0.0    Chemistries  Recent Labs  Lab 07/28/18 2137 07/29/18 0236 07/29/18 0418 07/29/18 0947 07/30/18 0739  NA 140 138 142 141 140  K 4.7 3.4* 4.3 3.7 3.5  CL 108 110 111 111 112*  CO2 19* 22 24 19* 23   GLUCOSE 427* 473* 152* 212* 128*  BUN 19 16 15 13 6   CREATININE 1.33* 0.83 0.86 0.83 0.65  CALCIUM 10.0 8.5* 8.9 8.8* 8.5*  MG  --   --   --   --  1.8  AST 41  --   --   --  44*  ALT 31  --   --   --  32  ALKPHOS 49  --   --   --  42  BILITOT 0.9  --   --   --  0.4   ------------------------------------------------------------------------------------------------------------------ No results for input(s): CHOL, HDL, LDLCALC, TRIG, CHOLHDL, LDLDIRECT in the last 72 hours.  Lab Results  Component Value Date   HGBA1C 8.5 (H) 07/29/2018   ------------------------------------------------------------------------------------------------------------------ No results for input(s): TSH, T4TOTAL, T3FREE, THYROIDAB in the last 72 hours.  Invalid input(s): FREET3 ------------------------------------------------------------------------------------------------------------------ No results for input(s): VITAMINB12, FOLATE, FERRITIN, TIBC, IRON, RETICCTPCT in the last 72 hours.  Coagulation profile No results for input(s): INR, PROTIME in the last 168 hours.  No results for input(s): DDIMER in the last 72 hours.  Cardiac Enzymes Recent Labs  Lab 07/29/18 0947  TROPONINI <0.03   ------------------------------------------------------------------------------------------------------------------ No results found for: BNP  Inpatient Medications  Scheduled Meds: . benztropine  0.5 mg Oral BID  . insulin aspart  0-9 Units Subcutaneous Q4H  . insulin glargine  15 Units Subcutaneous Daily  . risperiDONE  1 mg Oral BID  .  simvastatin  40 mg Oral q1800   Continuous Infusions: PRN Meds:.dextrose, haloperidol lactate, ziprasidone  Micro Results Recent Results (from the past 240 hour(s))  Novel Coronavirus,NAA,(SEND-OUT TO REF LAB - TAT 24-48 hrs); Hosp Order     Status: None   Collection Time: 07/29/18  2:39 AM   Specimen: Nasopharyngeal Swab; Respiratory  Result Value Ref Range Status    SARS-CoV-2, NAA NOT DETECTED NOT DETECTED Final    Comment: (NOTE) This test was developed and its performance characteristics determined by [employer]. This test has not been FDA cleared or approved. This test has been authorized by FDA under an Emergency Use Authorization (EUA). This test is only authorized for the duration of time the declaration that circumstances exist justifying the authorization of the emergency use of in vitro diagnostic tests for detection of SARS-CoV-2 virus and/or diagnosis of COVID-19 infection under section 564(b)(1) of the Act, 21 U.S.C. 408XKG-8(J)(8), unless the authorization is terminated or revoked sooner. When diagnostic testing is negative, the possibility of a false negative result should be considered in the context of a patient's recent exposures and the presence of clinical signs and symptoms consistent with COVID-19. An individual without symptoms of COVID-19 and who is not shedding SARS-CoV-2 virus would expect to have a negative (not detected) result in this assay. Performed  At: [facility] [address] [place], [place] [id number] Rush Farmer MD OV:[id number]    [place]  Final    Comment: Performed at [facility], [place] [address] [place], [place] [address]    Radiology Reports Dg Abdomen Acute W/chest  Result Date: 07/16/2018 CLINICAL DATA:  Altered mental status over the past 4 days. EXAM: DG ABDOMEN ACUTE W/ 1V CHEST COMPARISON:  Single view of the chest and CT chest 05/25/2018. FINDINGS: Single-view of the chest demonstrates clear lungs and normal heart size. No pneumothorax or pleural effusion. Two views of the abdomen show no free intraperitoneal air. The bowel gas pattern is nonobstructive. Large volume of stool throughout the colon noted. No abnormal abdominal calcification or focal bony abnormality. IMPRESSION: No acute finding chest or abdomen. Large colonic stool burden.  Electronically Signed   By: Inge Rise M.D.   On: 07/16/2018 14:40    Time Spent in minutes  20   Zaylia Riolo M.D on 07/31/2018 at 2:18 PM  Between 7am to 7pm - Pager - [phone]  After 7pm go to [email or web address] - password [facility]  [facility] -  Office  [phone]

## 2018-07-31 NOTE — Progress Notes (Addendum)
Report given to behavioral health RN Norva Riffle). All questions were answered. Patient's grandfather updated.   Grandfather will pick up patient's clothes from the unit sometimes tomorrow.

## 2018-07-31 NOTE — Clinical Social Work Note (Signed)
LATE ENTRY: Police transport arranged to get patient to [facility]. Patient IV-C'd and this paperwork accompanied her to [facility]. Nurse provided with information to call report.  Julisa Flippo Givens, MSW, LCSW Licensed Clinical Social Worker [place] [phone]

## 2018-07-31 NOTE — Tx Team (Signed)
Initial Treatment Plan 07/31/2018 4:39 PM Debbie Dorsey ZOX:[id number]    PATIENT STRESSORS: Health problems Medication change or noncompliance Traumatic event   PATIENT STRENGTHS: Supportive family/friends   PATIENT IDENTIFIED PROBLEMS: confusion  psychosis  Depression/anxiety                 DISCHARGE CRITERIA:  Ability to meet basic life and health needs Improved stabilization in mood, thinking, and/or behavior Medical problems require only outpatient monitoring  PRELIMINARY DISCHARGE PLAN: Outpatient therapy Participate in family therapy Return to previous living arrangement  PATIENT/FAMILY INVOLVEMENT: This treatment plan has been presented to and reviewed with the patient, Debbie Dorsey.  The patient and family have been given the opportunity to ask questions and make suggestions.  Baron Sane, RN 07/31/2018, 4:39 PM

## 2018-07-31 NOTE — Progress Notes (Signed)
[facility] notified that patient has bp 86/61 asleep. Arthor Captain LPN

## 2018-07-31 NOTE — Progress Notes (Signed)
1:1 Progress Note D: Pt currently in the hallway getting sleep medication. Patient appropriate to situation. Pt in no current distress.  A: Sitter is currently standing beside pt. R: Pt remains safe on a 1:1 per MD orders.

## 2018-07-31 NOTE — Progress Notes (Signed)
Patient ID: Debbie Dorsey, female   DOB: [birth year], 48 y.o.   MRN: [id number] Admission Note  Pt is a 48 yo female that presents IVC'd on 07/31/2018 with confusion, depression, anxiety, and disorientation. Pt's assessment was minimal due to her being confused, fixated on "not being harmed" and her grandfather visiting. Constant redirection was provided but no sign of learning noted. From previous reports the pt was brought into the ED after trying to jump out of a window. Pt was hyperglycemic upon ED admission. Pt was seen previously here with same presentation. Pt was on a 1:1 then. Pt was a 1:1 at [employer]. Pt's blood sugar last night was 17 which was corrected. Pt needed frequent redirection to toilet. Pt denies any physical pain. From a previous report: Debbie Dorsey is a 48 y.o. female with history of diabetes mellitus type 2, paranoid schizophrenia was brought to the ER after patient was trying to jump out of the running car while patient was with the family.  Patient he was in the passenger seat.  Per patient's granddaughter who provided the history patient has been getting more agitated and restless last 1 week and had gone to a psychiatrist last week and had some medications adjusted.  Including patient was given trazodone for sleeping which patient has been taking but has not been sleeping well.  Prior to this patient has not shown any suicidal ideation.  Has not overdosed with any medication and has been taking her insulin and her antipsychotic medications.  Per family patient Xarelto was discontinued last month by primary care physician.  Has had previous history of PE.  Pt denies si/hi/ah/vh and verbally agrees to approach staff if these become apparent or before harming herself/others while at [place] signed, skin/belongings search completed and patient oriented to unit. Patient stable at this time. Patient given the opportunity to express concerns and ask questions. Patient given  toiletries. Will continue to monitor.

## 2018-07-31 NOTE — Progress Notes (Addendum)
Nursing Progress Note: 7p-7a D: Pt currently presents with a minimal/paranoid/preoccupied/terrified/incoherent affect and behavior. Not interacting with the milieu. Pt reports poor sleep during the previous night with current medication regimen.  A: Pt provided with medications per providers orders. Pt CBG was 211.  Pt educated on medications but pt refused insulin coverage. Pt's labs and vitals were monitored throughout the night. Pt supported emotionally and encouraged to express concerns and questions. Pt educated on medications.  R: Pt's safety ensured with 15 minute and environmental checks. Pt currently denies SI, HI, and AVH. Pt verbally contracts to seek staff if SI,HI, or AVH occurs and to consult with staff before acting on any harmful thoughts. Will continue to monitor.   [place] NOVEL CORONAVIRUS (COVID-19) DAILY CHECK-OFF SYMPTOMS - answer yes or no to each - every day NO YES  Have you had a fever in the past 24 hours?  . Fever (Temp > 37.80C / 100F) X   Have you had any of these symptoms in the past 24 hours? . New Cough .  Sore Throat  .  Shortness of Breath .  Difficulty Breathing .  Unexplained Body Aches   X   Have you had any one of these symptoms in the past 24 hours not related to allergies?   . Runny Nose .  Nasal Congestion .  Sneezing   X   If you have had runny nose, nasal congestion, sneezing in the past 24 hours, has it worsened?  X   EXPOSURES - check yes or no X   Have you traveled outside the state in the past 14 days?  X   Have you been in contact with someone with a confirmed diagnosis of COVID-19 or PUI in the past 14 days without wearing appropriate PPE?  X   Have you been living in the same home as a person with confirmed diagnosis of COVID-19 or a PUI (household contact)?    X   Have you been diagnosed with COVID-19?    X              What to do next: Answered NO to all: Answered YES to anything:   Proceed with unit schedule Follow the [facility]  Inpatient Flowsheet.

## 2018-07-31 NOTE — Progress Notes (Signed)
Initial 1:1 note  Pt is confused, redirectable, but wandering and trying to elope. Pt is placed on a 1:1 for safety. Pt is still focused on leaving but pleasant. Pt denies any physical pain. Pt is anxious, guarded, and worried. Pt safe on the unit. q75m safety checks implemented and continued. Will continue to monitor. 1:1 within arms reach.

## 2018-08-01 DIAGNOSIS — F200 Paranoid schizophrenia: Secondary | ICD-10-CM

## 2018-08-01 LAB — GLUCOSE, CAPILLARY
Glucose-Capillary: 205 mg/dL — ABNORMAL HIGH (ref 70–99)
Glucose-Capillary: 111 mg/dL — ABNORMAL HIGH (ref 70–99)
Glucose-Capillary: 345 mg/dL — ABNORMAL HIGH (ref 70–99)
Glucose-Capillary: 373 mg/dL — ABNORMAL HIGH (ref 70–99)

## 2018-08-01 MED ORDER — RISPERIDONE 3 MG PO TABS
3.0000 mg | ORAL_TABLET | Freq: Two times a day (BID) | ORAL | Status: DC
  Administered 2018-08-01 – 2018-08-03 (×4): 3 mg via ORAL
  Filled 2018-08-01 (×8): qty 1

## 2018-08-01 MED ORDER — LORAZEPAM 1 MG PO TABS
1.0000 mg | ORAL_TABLET | Freq: Once | ORAL | Status: AC
  Administered 2018-08-01: 1 mg via ORAL
  Filled 2018-08-01: qty 1

## 2018-08-01 NOTE — Progress Notes (Signed)
Recreation Therapy Notes  Date: 08/01/2018 Time: 10:00 am Location: 500 hall   Group Topic: Triggers  Goal Area(s) Addresses:  Patient will work on worksheet on Triggers. Patient will follow directions on first prompt.  Behavioral Response: Appropriate  Intervention: Worksheet  Activity:  Staff on [address] hall were provided with a worksheet on Triggers. Staff was instructed to give it to the patients and have them work on it in place of [place]. Staff was also given 2 coloring sheets and 2 word search and were given the option to give them out.  Education:  Ability to follow Directions, Change of thought processes Discharge Planning, Goal Planning.   Education Outcome: Acknowledges education/In group clarification offered  Clinical Observations/Feedback: . Due to COVID-19, guidelines group was not held. Group members were provided a learning activity worksheet to work on the topic and above-stated goals. LRT is available to answer any questions patient may have regarding the worksheet.  Tomi Likens, LRT/CTRS         Ellwyn Ergle L Tariq Pernell 08/01/2018 1:20 PM

## 2018-08-01 NOTE — Progress Notes (Signed)
1:1 Progress Note D: Pt currently awake in bed tossing and turning. Trying to talk to tech. Pt in no current distress.  A: Sitter is currently redirecting pt and is sitting beside pt. R: Pt remains safe on a 1:1 per MD orders.

## 2018-08-01 NOTE — H&P (Signed)
Psychiatric Admission Assessment Adult  Patient Identification: Debbie Dorsey MRN:  [id number] Date of Evaluation:  08/01/2018 Chief Complaint:  schizophrenia Principal Diagnosis: Exacerbation and underlying psychotic disorder complicated by recent medical compromise that required brief period of stabilization Diagnosis:  Active Problems:   Schizophrenia ([place])  History of Present Illness:   Patient is known to the service she has required prior inpatient stabilizations on multiple occasions, she had presented on 6/23 with hyperglycemia and required medical stabilization however she continued to have psychotic symptoms she was described as paranoid, responding to stimuli so forth and was transferred to our service for further care. Patient is somewhat disorganized in thought and behavior she is alert and oriented to person general situation she knows she is at [employer] long" but she cannot answer to the time of day and so forth.  Her speech is rather garbled and somewhat disjointed.  Mostly sentence fragments but not word salad.  She tries to initiate many sentences but does not complete them.  Remains on one-to-one precautions  According to the assessment of 6/23 HPI:  Patient is seen and examined. Patient is a 48 YO female with a PPHx significant for schizophrenia who presented on 6/22 under IVC. The patient is a poor historian and most of information is collected from old chart. Patient was brought to ED by family after developing "erratic behavior". She apparently attempted to jump out of the car on her way to the hospital.  Unfortunately her blood sugar when seen in the ED was over 400. Her grandfather and police had done the initial IVC. It is fairly clear that she had been non-compliant with both psych and diabetic medications. The patient was examined by video today and appeared to be significantly paranoid and responding to internal stimuli. She kept trying to look under the bed for  "something". Her last psych admission to our facility was 04/19/18. At that time she had been treated with risperdal 0.5 mg BID as well as temazepam and cogentin. She is currently on 0.5 mg BID of risperdal and apparently may have had the risperdal consta injection.She is currently psychotic, paranoid and responding to auditory hallucinations.  Past Psychiatric History: Patient has had multiple psychiatric hospitalizations with the last in March of this year. She has been most recently treated with the above medications.   Associated Signs/Symptoms: Depression Symptoms:  psychomotor retardation, (Hypo) Manic Symptoms:  Flight of Ideas, Anxiety Symptoms:  n/a Psychotic Symptoms:  Delusions, Hallucinations: Auditory PTSD Symptoms: NA Total Time spent with patient: 45 minutes  Past Psychiatric History: exstensive  Is the patient at risk to self? Yes.    Has the patient been a risk to self in the past 6 months? No.  Has the patient been a risk to self within the distant past? No.  Is the patient a risk to others? No.  Has the patient been a risk to others in the past 6 months? No.  Has the patient been a risk to others within the distant past? No.   Alcohol Screening: 1. How often do you have a drink containing alcohol?: Never 2. How many drinks containing alcohol do you have on a typical day when you are drinking?: 1 or 2 3. How often do you have six or more drinks on one occasion?: Never AUDIT-C Score: 0 4. How often during the last year have you found that you were not able to stop drinking once you had started?: Never 5. How often during the last year have  you failed to do what was normally expected from you becasue of drinking?: Never 6. How often during the last year have you needed a first drink in the morning to get yourself going after a heavy drinking session?: Never 7. How often during the last year have you had a feeling of guilt of remorse after drinking?: Never 8. How often  during the last year have you been unable to remember what happened the night before because you had been drinking?: Never 9. Have you or someone else been injured as a result of your drinking?: No 10. Has a relative or friend or a doctor or another health worker been concerned about your drinking or suggested you cut down?: No Alcohol Use Disorder Identification Test Final Score (AUDIT): 0 Substance Abuse History in the last 12 months:  No. Consequences of Substance Abuse: NA Previous Psychotropic Medications: Yes  Psychological Evaluations: No  Past Medical History:  Past Medical History:  Diagnosis Date  . Diabetes mellitus without complication ([place])    History reviewed. No pertinent surgical history. Family History:  Family History  Problem Relation Age of Onset  . Mental illness Sister    Family Psychiatric  History: neg Tobacco Screening:   Social History:  Social History   Substance and Sexual Activity  Alcohol Use No     Social History   Substance and Sexual Activity  Drug Use No    Additional Social History:                           Allergies:  No Known Allergies Lab Results:  Results for orders placed or performed during the hospital encounter of 07/31/18 (from the past 48 hour(s))  Glucose, capillary     Status: Abnormal   Collection Time: 07/31/18  5:00 PM  Result Value Ref Range   Glucose-Capillary 252 (H) 70 - 99 mg/dL  Glucose, capillary     Status: Abnormal   Collection Time: 07/31/18  8:32 PM  Result Value Ref Range   Glucose-Capillary 211 (H) 70 - 99 mg/dL  Glucose, capillary     Status: Abnormal   Collection Time: 08/01/18  6:05 AM  Result Value Ref Range   Glucose-Capillary 205 (H) 70 - 99 mg/dL    Blood Alcohol level:  Lab Results  Component Value Date   ETH <10 07/28/2018   ETH <10 29/52/8413    Metabolic Disorder Labs:  Lab Results  Component Value Date   HGBA1C 8.5 (H) 07/29/2018   MPG 197 07/29/2018   MPG 191.51  05/26/2018   No results found for: PROLACTIN No results found for: CHOL, TRIG, HDL, CHOLHDL, VLDL, LDLCALC  Current Medications: Current Facility-Administered Medications  Medication Dose Route Frequency Provider Last Rate Last Dose  . acetaminophen (TYLENOL) tablet 650 mg  650 mg Oral Q6H PRN Sharma Covert, MD      . alum & mag hydroxide-simeth (MAALOX/MYLANTA) 200-200-20 MG/5ML suspension 30 mL  30 mL Oral Q4H PRN Sharma Covert, MD      . hydrOXYzine (ATARAX/VISTARIL) tablet 25 mg  25 mg Oral TID PRN Sharma Covert, MD   25 mg at 07/31/18 2201  . insulin aspart (novoLOG) injection 0-9 Units  0-9 Units Subcutaneous TID WC Sharma Covert, MD   5 Units at 07/31/18 1800  . insulin glargine (LANTUS) injection 15 Units  15 Units Subcutaneous Daily Sharma Covert, MD   15 Units at 07/31/18 1805  . magnesium  hydroxide (MILK OF MAGNESIA) suspension 30 mL  30 mL Oral Daily PRN Sharma Covert, MD      . risperiDONE (RISPERDAL) tablet 1 mg  1 mg Oral BID Sharma Covert, MD   1 mg at 07/31/18 1800  . simvastatin (ZOCOR) tablet 40 mg  40 mg Oral q1800 Sharma Covert, MD   40 mg at 07/31/18 1804  . traZODone (DESYREL) tablet 50 mg  50 mg Oral QHS PRN Sharma Covert, MD   50 mg at 07/31/18 2203  . ziprasidone (GEODON) injection 10 mg  10 mg Intramuscular Q6H PRN Sharma Covert, MD       PTA Medications: Medications Prior to Admission  Medication Sig Dispense Refill Last Dose  . benztropine (COGENTIN) 0.5 MG tablet Take 1 tablet (0.5 mg total) by mouth 2 (two) times daily. Restart on 06/07/2018 as per psychiatry (Patient not taking: Reported on 07/29/2018)     . benztropine (COGENTIN) 1 MG tablet Take 0.5 mg by mouth 2 (two) times a day.     . busPIRone (BUSPAR) 5 MG tablet Take 5 mg by mouth 3 (three) times daily.     . insulin NPH-regular Human (70-30) 100 UNIT/ML injection Inject 32 Units into the skin 2 (two) times daily with a meal.     . risperiDONE (RISPERDAL)  0.5 MG tablet Take 1 tablet (0.5 mg total) by mouth 2 (two) times daily. Start on 06/07/2018 as per psychiatry (Patient not taking: Reported on 07/29/2018) 30 tablet 0   . Rivaroxaban (XARELTO) 15 MG TABS tablet Take 15 mg by mouth 2 (two) times daily with a meal.     . Rivaroxaban 15 & 20 MG TBPK Take as directed on package: Start with one 15mg  tablet by mouth twice a day with food. On Day 22, switch to one 20mg  tablet once a day with food. (Patient not taking: Reported on 07/29/2018) 51 each 0   . simvastatin (ZOCOR) 40 MG tablet Take 40 mg by mouth daily at 12 noon.      . temazepam (RESTORIL) 15 MG capsule Take 1 capsule (15 mg total) by mouth at bedtime as needed for sleep. (Patient not taking: Reported on 07/29/2018) 30 capsule 0   . traZODone (DESYREL) 100 MG tablet Take 100 mg by mouth daily.       Musculoskeletal: Strength & Muscle Tone: within normal limits Gait & Station: normal Patient leans: N/A  Psychiatric Specialty Exam: Physical Exam no EPS or TD  ROS unable to provide much meaningful information but has diabetes as far as endocrine issues/neurological negative/cardiovascular negative  Blood pressure 114/71, pulse 91, temperature 98 F (36.7 C), temperature source Oral, resp. rate 18, height 4\' 11"  (1.499 m), weight 41.3 kg, SpO2 100 %, unknown if currently breastfeeding.Body mass index is 18.38 kg/m.  General Appearance: Casual  Eye Contact:  Minimal  Speech:  Slow and Slurred  Volume:  Decreased  Mood:  Dysphoric  Affect:  Constricted  Thought Process:  Irrelevant and Descriptions of Associations: Loose  Orientation:  Other:  Person place general situation  Thought Content:  Illogical and Disorganized  Suicidal Thoughts:  No  Homicidal Thoughts:  No  Memory:  Immediate;   Poor  Judgement:  Poor  Insight:  Lacking  Psychomotor Activity:  Decreased  Concentration:  Concentration: Poor  Recall:  Poor  Fund of Knowledge:  Poor  Language:  Poor  Akathisia:  Negative   Handed:  Right  AIMS (if indicated):  Assets:  Resilience Social Support  ADL's:  Intact  Cognition:  WNL  Sleep:  Number of Hours: 2    Treatment Plan Summary: Daily contact with patient to assess and evaluate symptoms and progress in treatment  Observation Level/Precautions:  15 minute checks  Laboratory:  UDS  Psychotherapy: Reality based  Medications: Several adjustments  Consultations: Not necessary  Discharge Concerns: Longer-term stability and medical stabilization  Estimated LOS: 7-10  Other: Axis I exacerbation of underlying psychotic disorder/paranoid schizophrenia/recent diagnosis of DKA   Physician Treatment Plan for Primary Diagnosis: Treatment of psychosis and medical stabilization Long Term Goal(s): Improvement in symptoms so as ready for discharge  Short Term Goals: Ability to identify changes in lifestyle to reduce recurrence of condition will improve, Ability to verbalize feelings will improve and Ability to disclose and discuss suicidal ideas  Physician Treatment Plan for Secondary Diagnosis: Active Problems:   Schizophrenia ([place])  Long Term Goal(s): Improvement in symptoms so as ready for discharge  Short Term Goals: Ability to identify and develop effective coping behaviors will improve, Ability to maintain clinical measurements within normal limits will improve and Compliance with prescribed medications will improve  I certify that inpatient services furnished can reasonably be expected to improve the patient's condition.    Johnn Hai, MD 6/25/20201:57 PM

## 2018-08-01 NOTE — [facility] Suicide Risk Assessment (Signed)
[facility] Admission Suicide Risk Assessment   Nursing information obtained from:  Patient Demographic factors:  Low socioeconomic status, Unemployed Current Mental Status:  Self-harm behaviors Loss Factors:  Decline in physical health Historical Factors:  Impulsivity Risk Reduction Factors:  Positive coping skills or problem solving skills, Living with another person, especially a relative, Sense of responsibility to family, Positive social support, Positive therapeutic relationship  Total Time spent with patient: 45 minutes Principal Problem: Schizophrenic exacerbation Diagnosis:  Active Problems:   Schizophrenia ([place])  Subjective Data: Exacerbation of psychotic disorder  Continued Clinical Symptoms:  Alcohol Use Disorder Identification Test Final Score (AUDIT): 0 The "Alcohol Use Disorders Identification Test", Guidelines for Use in Primary Care, Second Edition.  World Pharmacologist [facility]). Score between 0-7:  no or low risk or alcohol related problems. Score between 8-15:  moderate risk of alcohol related problems. Score between 16-19:  high risk of alcohol related problems. Score 20 or above:  warrants further diagnostic evaluation for alcohol dependence and treatment.   Musculoskeletal: Strength & Muscle Tone: within normal limits Gait & Station: normal Patient leans: N/A  Psychiatric Specialty Exam: Physical Exam no EPS or TD  ROS unable to provide much meaningful information but has diabetes as far as endocrine issues/neurological negative/cardiovascular negative  Blood pressure 114/71, pulse 91, temperature 98 F (36.7 C), temperature source Oral, resp. rate 18, height 4\' 11"  (1.499 m), weight 41.3 kg, SpO2 100 %, unknown if currently breastfeeding.Body mass index is 18.38 kg/m.  General Appearance: Casual  Eye Contact:  Minimal  Speech:  Slow and Slurred  Volume:  Decreased  Mood:  Dysphoric  Affect:  Constricted  Thought Process:  Irrelevant and Descriptions of  Associations: Loose  Orientation:  Other:  Person place general situation  Thought Content:  Illogical and Disorganized  Suicidal Thoughts:  No  Homicidal Thoughts:  No  Memory:  Immediate;   Poor  Judgement:  Poor  Insight:  Lacking  Psychomotor Activity:  Decreased  Concentration:  Concentration: Poor  Recall:  Poor  Fund of Knowledge:  Poor  Language:  Poor  Akathisia:  Negative  Handed:  Right  AIMS (if indicated):     Assets:  Resilience Social Support  ADL's:  Intact  Cognition:  WNL  Sleep:  Number of Hours: 2   CLINICAL FACTORS: Psychosis     COGNITIVE FEATURES THAT CONTRIBUTE TO RISK:  Polarized thinking    SUICIDE RISK:   Minimal: No identifiable suicidal ideation.  Patients presenting with no risk factors but with morbid ruminations; may be classified as minimal risk based on the severity of the depressive symptoms  PLAN OF CARE: see eval-too disorganized to have suicidal thoughts at present  I certify that inpatient services furnished can reasonably be expected to improve the patient's condition.   Johnn Hai, MD 08/01/2018, 2:02 PM

## 2018-08-01 NOTE — Progress Notes (Signed)
1:1 note  pt found in bed; resting. Pt allowed to continue to rest. Pt was reported as not falling asleep til 0400. Pt doesn't seem in distress by unlabored breathing and symmetrical chest expansion. Pt's sitter within arms reach. Pt's 1:1 continues for safety. Will continue to monitor.

## 2018-08-01 NOTE — Progress Notes (Signed)
Inpatient Diabetes Program Recommendations  AACE/ADA: New Consensus Statement on Inpatient Glycemic Control (2015)  Target Ranges:  Prepandial:   less than 140 mg/dL      Peak postprandial:   less than 180 mg/dL (1-2 hours)      Critically ill patients:  140 - 180 mg/dL   Lab Results  Component Value Date   GLUCAP 111 (H) 08/01/2018   HGBA1C 8.5 (H) 07/29/2018    Review of Glycemic Control  Diabetes history: DM2 Outpatient Diabetes medications: NPH 70/30 32 units bid Current orders for Inpatient glycemic control: Lantus 15 units QD, Novolog 0-9 units tidwc  HgbA1C 8.5% - sub-par control  Inpatient Diabetes Program Recommendations:     Increase Lantus to 18 units QD Add HS correction  Will need to f/u with PCP after discharge for HgbA1C of 8.5%.  Thank you. Lorenda Peck, RD, LDN, CDE Inpatient Diabetes Coordinator [phone]

## 2018-08-01 NOTE — Progress Notes (Signed)
1:1 Progress Note D: Pt fought sleep until 0400. Pt currently asleep in bed.Pt unable to sit up due to sedation. Patient appropriate to situation. Pt in no current distress.  A: CBG 205. Pt sound asleep during procedure.  Sitter is currently at bedside. R: Will move insulin to 0800 since she is unable to eat at this time. Pt remains safe on a 1:1 per MD orders. Will continue to monitor.

## 2018-08-01 NOTE — Progress Notes (Signed)
1:1 note  Pt is still asleep with sporadic awakenings. Pt hasn't left room since this morning. Pt is still with sitter. Pt doesn't seem in distress from equal and symmetrical chest expansions. Pt safe on the unit. q74m safety checks implemented and continued. Will continue to monitor. 1:1 continues.

## 2018-08-01 NOTE — Progress Notes (Signed)
Recreation Therapy Notes  INPATIENT RECREATION THERAPY ASSESSMENT  Patient Details Name: Debbie Dorsey MRN: [id number] DOB: 1970-10-27 Today's Date: 08/01/2018    Comments:  Patient has psychotic symptoms and paranoid. Patient appears to be responding for internal stimuli. Patient has disorganized thoughts, but is alter and oriented. Patient has disjointed speech upon presentation of admission.  Patient was brought into the hospital for "eratic behavior" and apparently attempted to jump out of the car on the way to the hospital.    Information Obtained From: Chart Review  Able to Participate in Assessment/Interview: Yes  Patient Presentation: Alert, Oriented  Reason for Admission (Per Patient): Active Symptoms  Patient Stressors: Other (Comment)("Health Problems, Medication Change or noncompliance, Traumatic event)  [place] of Residence:  [place]  Patient Strengths:  "supportive friends and family"  Patient Identified Areas of Improvement:  "confusion, psychosis, depression and anxiety"  Current AVH: Yes(Appears to be responding to internal stimuli)  Staff Intervention Plan: Group Attendance, Collaborate with Interdisciplinary Treatment Team  Consent to Intern Participation: N/A  Tomi Likens, LRT/CTRS  [place] 08/01/2018, 2:55 PM

## 2018-08-02 LAB — GLUCOSE, CAPILLARY
Glucose-Capillary: 265 mg/dL — ABNORMAL HIGH (ref 70–99)
Glucose-Capillary: 366 mg/dL — ABNORMAL HIGH (ref 70–99)
Glucose-Capillary: 381 mg/dL — ABNORMAL HIGH (ref 70–99)
Glucose-Capillary: 307 mg/dL — ABNORMAL HIGH (ref 70–99)

## 2018-08-02 MED ORDER — RIVAROXABAN 20 MG PO TABS
20.0000 mg | ORAL_TABLET | Freq: Every day | ORAL | Status: DC
  Administered 2018-08-02 – 2018-08-27 (×26): 20 mg via ORAL
  Filled 2018-08-02 (×27): qty 1

## 2018-08-02 MED ORDER — PRENATAL MULTIVITAMIN CH
1.0000 | ORAL_TABLET | Freq: Every day | ORAL | Status: DC
  Administered 2018-08-02 – 2018-08-27 (×26): 1 via ORAL
  Filled 2018-08-02 (×27): qty 1

## 2018-08-02 MED ORDER — INSULIN ASPART 100 UNIT/ML ~~LOC~~ SOLN
6.0000 [IU] | Freq: Three times a day (TID) | SUBCUTANEOUS | Status: DC
  Administered 2018-08-03 – 2018-08-27 (×67): 6 [IU] via SUBCUTANEOUS

## 2018-08-02 MED ORDER — INSULIN ASPART 100 UNIT/ML ~~LOC~~ SOLN
0.0000 [IU] | Freq: Every day | SUBCUTANEOUS | Status: DC
  Administered 2018-08-02: 4 [IU] via SUBCUTANEOUS
  Administered 2018-08-06 – 2018-08-10 (×2): 2 [IU] via SUBCUTANEOUS
  Administered 2018-08-14: 3 [IU] via SUBCUTANEOUS
  Administered 2018-08-16: 2 [IU] via SUBCUTANEOUS
  Administered 2018-08-17: 3 [IU] via SUBCUTANEOUS
  Administered 2018-08-22: 2 [IU] via SUBCUTANEOUS
  Administered 2018-08-25: 3 [IU] via SUBCUTANEOUS
  Administered 2018-08-26: 2 [IU] via SUBCUTANEOUS

## 2018-08-02 MED ORDER — INSULIN ASPART 100 UNIT/ML ~~LOC~~ SOLN
0.0000 [IU] | Freq: Three times a day (TID) | SUBCUTANEOUS | Status: DC
  Administered 2018-08-03: 18:00:00 4 [IU] via SUBCUTANEOUS
  Administered 2018-08-03: 7 [IU] via SUBCUTANEOUS
  Administered 2018-08-03: 6 [IU] via SUBCUTANEOUS
  Administered 2018-08-04 (×2): 4 [IU] via SUBCUTANEOUS
  Administered 2018-08-04 – 2018-08-05 (×2): 11 [IU] via SUBCUTANEOUS
  Administered 2018-08-05: 3 [IU] via SUBCUTANEOUS
  Administered 2018-08-05: 7 [IU] via SUBCUTANEOUS
  Administered 2018-08-06: 12:00:00 3 [IU] via SUBCUTANEOUS
  Administered 2018-08-06 – 2018-08-07 (×2): 7 [IU] via SUBCUTANEOUS
  Administered 2018-08-07 – 2018-08-08 (×2): 11 [IU] via SUBCUTANEOUS
  Administered 2018-08-08: 3 [IU] via SUBCUTANEOUS
  Administered 2018-08-08: 7 [IU] via SUBCUTANEOUS
  Administered 2018-08-09: 20 [IU] via SUBCUTANEOUS
  Administered 2018-08-09: 3 [IU] via SUBCUTANEOUS
  Administered 2018-08-09: 4 [IU] via SUBCUTANEOUS
  Administered 2018-08-10: 3 [IU] via SUBCUTANEOUS
  Administered 2018-08-10: 4 [IU] via SUBCUTANEOUS
  Administered 2018-08-10: 3 [IU] via SUBCUTANEOUS
  Administered 2018-08-11: 4 [IU] via SUBCUTANEOUS
  Administered 2018-08-11: 17:00:00 11 [IU] via SUBCUTANEOUS
  Administered 2018-08-12: 3 [IU] via SUBCUTANEOUS
  Administered 2018-08-12: 15 [IU] via SUBCUTANEOUS
  Administered 2018-08-13: 17:00:00 3 [IU] via SUBCUTANEOUS
  Administered 2018-08-13: 20 [IU] via SUBCUTANEOUS
  Administered 2018-08-14: 4 [IU] via SUBCUTANEOUS
  Administered 2018-08-14: 15 [IU] via SUBCUTANEOUS
  Administered 2018-08-14: 0 [IU] via SUBCUTANEOUS
  Administered 2018-08-15: 15 [IU] via SUBCUTANEOUS
  Administered 2018-08-15 (×2): 4 [IU] via SUBCUTANEOUS
  Administered 2018-08-16: 11 [IU] via SUBCUTANEOUS
  Administered 2018-08-16: 4 [IU] via SUBCUTANEOUS
  Administered 2018-08-18 (×2): 7 [IU] via SUBCUTANEOUS
  Administered 2018-08-18: 11 [IU] via SUBCUTANEOUS
  Administered 2018-08-19: 3 [IU] via SUBCUTANEOUS
  Administered 2018-08-19 (×2): 7 [IU] via SUBCUTANEOUS
  Administered 2018-08-20: 18:00:00 15 [IU] via SUBCUTANEOUS
  Administered 2018-08-20: 12:00:00 7 [IU] via SUBCUTANEOUS
  Administered 2018-08-21 (×2): 11 [IU] via SUBCUTANEOUS
  Administered 2018-08-21: 7 [IU] via SUBCUTANEOUS
  Administered 2018-08-22 (×2): 11 [IU] via SUBCUTANEOUS
  Administered 2018-08-22: 4 [IU] via SUBCUTANEOUS
  Administered 2018-08-23: 11 [IU] via SUBCUTANEOUS
  Administered 2018-08-23: 7 [IU] via SUBCUTANEOUS
  Administered 2018-08-23: 11 [IU] via SUBCUTANEOUS
  Administered 2018-08-24: 7 [IU] via SUBCUTANEOUS
  Administered 2018-08-24 (×2): 4 [IU] via SUBCUTANEOUS
  Administered 2018-08-25: 7 [IU] via SUBCUTANEOUS
  Administered 2018-08-25: 06:00:00 4 [IU] via SUBCUTANEOUS
  Administered 2018-08-25: 15 [IU] via SUBCUTANEOUS
  Administered 2018-08-26: 7 [IU] via SUBCUTANEOUS
  Administered 2018-08-26: 15 [IU] via SUBCUTANEOUS
  Administered 2018-08-26 – 2018-08-27 (×2): 7 [IU] via SUBCUTANEOUS
  Administered 2018-08-27: 3 [IU] via SUBCUTANEOUS
  Administered 2018-08-27: 11 [IU] via SUBCUTANEOUS
  Administered 2018-08-28: 4 [IU] via SUBCUTANEOUS

## 2018-08-02 MED ORDER — MODAFINIL 100 MG PO TABS
100.0000 mg | ORAL_TABLET | Freq: Every day | ORAL | Status: DC
  Administered 2018-08-02: 14:00:00 100 mg via ORAL
  Filled 2018-08-02: qty 1

## 2018-08-02 MED ORDER — RIVAROXABAN 15 MG PO TABS
15.0000 mg | ORAL_TABLET | Freq: Two times a day (BID) | ORAL | Status: DC
  Filled 2018-08-02 (×3): qty 1

## 2018-08-02 MED ORDER — COMPLETENATE 29-1 MG PO CHEW: 1.0000 | CHEWABLE_TABLET | Freq: Every day | ORAL | Status: DC

## 2018-08-02 NOTE — Progress Notes (Addendum)
Patient ID: Debbie Dorsey, female   DOB: 08/27/1970, 48 y.o.   MRN: [id number]  1:1 RN note  D: Pt asleep in bed with even and unlabored respirations. Sitter is with pt. No signs of distress or discomfort noted. A: Pt remains on 1:1 for safety. R: Pt remains safe on unit.

## 2018-08-02 NOTE — Progress Notes (Signed)
Patient ID: Debbie Dorsey, female   DOB: 1971/02/02, 48 y.o.   MRN: [id number]  1:1 RN note  D: Pt ambulating in hallway and dayroom. Pt is redirected several times to refrain from attempting to open unit door. Pt at nurse's station. Sitter is with pt. No signs os distress or discomfort noted. A: Pt remains on 1:1 for safety. R: Pt remains safe on unit.

## 2018-08-02 NOTE — Progress Notes (Signed)
Nursing 1:1 note D:Pt observed sleeping in bed with eyes closed. RR even and unlabored. No distress noted. A: 1:1 observation continues for safety  R: pt remains safe  

## 2018-08-02 NOTE — B[facility] Counselor (Addendum)
CSW attempted to complete PSA but pt was not able to provide any coherant information. Winferd Humphrey, MSW, LCSW Clinical Social Worker 08/02/2018 2:49 PM   CSW spoke with Dr. Jake Samples who agrees that pt lacks capacity and PSA should be completed with collateral sources at this time.   Winferd Humphrey, MSW, LCSW Clinical Social Worker 08/02/2018 3:52 PM

## 2018-08-02 NOTE — Progress Notes (Signed)
Nursing 1:1 note D:Pt observed lying in bed with eyes closed. RR even and unlabored. No distress noted. A: 1:1 observation continues for safety  R: pt remains safe  

## 2018-08-02 NOTE — Progress Notes (Signed)
Patient ID: Debbie Dorsey, female   DOB: Sep 28, 1970, 48 y.o.   MRN: [id number]  1:1 RN note  D: Pt awake and up in room. PT walked in the hallway and sat in dayroom. Pt communicating with RN staff, however she is mumbling soflty. Sitter is with pt. No signs of distress or discomfort noted. A: Pt remains on 1:1 for safety. R: Pt remains safe on unit.

## 2018-08-02 NOTE — Progress Notes (Signed)
[facility] MD Progress Note  08/02/2018 10:23 AM Debbie Dorsey  MRN:  [id number] Subjective:    Patient is known to the service she has a history of clotting disorder needing further medical stabilization and we will resume her Xarelto she is up and about some.  The bottom line is she has had a psychotic disorder chronically, schizophrenic type condition followed by [facility] and done well with Risperdal in the past but has decompensated lately.  We are going to continue to try the risperidone but may be switched to perphenazine if we do not see a response this weekend.  She is still disorganized in thought calm and behavior cannot complete a sentence begins to speak but only mumbles a few syllables and then does not complete the sentence Principal Problem: Exacerbation of underlying schizophrenic condition Diagnosis: Active Problems:   Schizophrenia ([place])  Total Time spent with patient: 20 minutes  Past Medical History:  Past Medical History:  Diagnosis Date  . Diabetes mellitus without complication ([place])    History reviewed. No pertinent surgical history. Family History:  Family History  Problem Relation Age of Onset  . Mental illness Sister    Family Psychiatric  History: see eval Social History:  Social History   Substance and Sexual Activity  Alcohol Use No     Social History   Substance and Sexual Activity  Drug Use No    Social History   Socioeconomic History  . Marital status: Single    Spouse name: Not on file  . Number of children: Not on file  . Years of education: Not on file  . Highest education level: Not on file  Occupational History  . Not on file  Social Needs  . Financial resource strain: Not on file  . Food insecurity    Worry: Not on file    Inability: Not on file  . Transportation needs    Medical: Not on file    Non-medical: Not on file  Tobacco Use  . Smoking status: Never Smoker  . Smokeless tobacco: Never Used  Substance and Sexual Activity   . Alcohol use: No  . Drug use: No  . Sexual activity: Not Currently  Lifestyle  . Physical activity    Days per week: Not on file    Minutes per session: Not on file  . Stress: Not on file  Relationships  . Social Herbalist on phone: Not on file    Gets together: Not on file    Attends religious service: Not on file    Active member of club or organization: Not on file    Attends meetings of clubs or organizations: Not on file    Relationship status: Not on file  Other Topics Concern  . Not on file  Social History Narrative  . Not on file   Additional Social History:                         Sleep: Fair  Appetite:  Good  Current Medications: Current Facility-Administered Medications  Medication Dose Route Frequency Provider Last Rate Last Dose  . acetaminophen (TYLENOL) tablet 650 mg  650 mg Oral Q6H PRN Sharma Covert, MD      . alum & mag hydroxide-simeth (MAALOX/MYLANTA) 200-200-20 MG/5ML suspension 30 mL  30 mL Oral Q4H PRN Sharma Covert, MD      . hydrOXYzine (ATARAX/VISTARIL) tablet 25 mg  25 mg Oral TID PRN Mallie Darting,  Cordie Grice, MD   25 mg at 07/31/18 2201  . insulin aspart (novoLOG) injection 0-9 Units  0-9 Units Subcutaneous TID WC Sharma Covert, MD   5 Units at 08/02/18 [phone]  . insulin glargine (LANTUS) injection 15 Units  15 Units Subcutaneous Daily Sharma Covert, MD   15 Units at 08/02/18 0831  . magnesium hydroxide (MILK OF MAGNESIA) suspension 30 mL  30 mL Oral Daily PRN Sharma Covert, MD      . risperiDONE (RISPERDAL) tablet 3 mg  3 mg Oral BID Johnn Hai, MD   3 mg at 08/02/18 0830  . Rivaroxaban (XARELTO) tablet 15 mg  15 mg Oral BID Johnn Hai, MD      . simvastatin (ZOCOR) tablet 40 mg  40 mg Oral q1800 Sharma Covert, MD   40 mg at 08/01/18 1736  . traZODone (DESYREL) tablet 50 mg  50 mg Oral QHS PRN Sharma Covert, MD   50 mg at 08/01/18 2137  . ziprasidone (GEODON) injection 10 mg  10 mg  Intramuscular Q6H PRN Sharma Covert, MD        Lab Results:  Results for orders placed or performed during the hospital encounter of 07/31/18 (from the past 48 hour(s))  Glucose, capillary     Status: Abnormal   Collection Time: 07/31/18  5:00 PM  Result Value Ref Range   Glucose-Capillary 252 (H) 70 - 99 mg/dL  Glucose, capillary     Status: Abnormal   Collection Time: 07/31/18  8:32 PM  Result Value Ref Range   Glucose-Capillary 211 (H) 70 - 99 mg/dL  Glucose, capillary     Status: Abnormal   Collection Time: 08/01/18  6:05 AM  Result Value Ref Range   Glucose-Capillary 205 (H) 70 - 99 mg/dL  Glucose, capillary     Status: Abnormal   Collection Time: 08/01/18 11:39 AM  Result Value Ref Range   Glucose-Capillary 111 (H) 70 - 99 mg/dL   Comment 1 Notify RN    Comment 2 Document in Chart   Glucose, capillary     Status: Abnormal   Collection Time: 08/01/18  5:21 PM  Result Value Ref Range   Glucose-Capillary 345 (H) 70 - 99 mg/dL  Glucose, capillary     Status: Abnormal   Collection Time: 08/01/18  6:45 PM  Result Value Ref Range   Glucose-Capillary 373 (H) 70 - 99 mg/dL   Comment 1 Notify RN    Comment 2 Document in Chart   Glucose, capillary     Status: Abnormal   Collection Time: 08/02/18  6:18 AM  Result Value Ref Range   Glucose-Capillary 265 (H) 70 - 99 mg/dL    Blood Alcohol level:  Lab Results  Component Value Date   ETH <10 07/28/2018   ETH <10 11/91/4782    Metabolic Disorder Labs: Lab Results  Component Value Date   HGBA1C 8.5 (H) 07/29/2018   MPG 197 07/29/2018   MPG 191.51 05/26/2018   No results found for: PROLACTIN No results found for: CHOL, TRIG, HDL, CHOLHDL, VLDL, LDLCALC  Physical Findings: AIMS: Facial and Oral Movements Muscles of Facial Expression: None, normal Lips and Perioral Area: None, normal Jaw: None, normal Tongue: None, normal,Extremity Movements Upper (arms, wrists, hands, fingers): None, normal Lower (legs, knees,  ankles, toes): None, normal, Trunk Movements Neck, shoulders, hips: None, normal, Overall Severity Severity of abnormal movements (highest score from questions above): None, normal Incapacitation due to abnormal movements: None, normal Patient's  awareness of abnormal movements (rate only patient's report): No Awareness, Dental Status Current problems with teeth and/or dentures?: No Does patient usually wear dentures?: No  CIWA:    COWS:     Musculoskeletal: Strength & Muscle Tone: within normal limits Gait & Station: normal Patient leans: N/A  Psychiatric Specialty Exam: Physical Exam  ROS  Blood pressure 114/71, pulse 91, temperature 98 F (36.7 C), temperature source Oral, resp. rate 18, height 4\' 11"  (1.499 m), weight 41.3 kg, SpO2 100 %, unknown if currently breastfeeding.Body mass index is 18.38 kg/m.  General Appearance: Casual  Eye Contact:  Fair  Speech:  Garbled and Slurred  Volume:  Decreased  Mood:  Dysphoric  Affect:  Blunt  Thought Process:  Disorganized and Descriptions of Associations: Loose  Orientation:  Other:  Presumed to person and situation but unable to articulate answers  Thought Content:  Disorganized  Suicidal Thoughts:  No  Homicidal Thoughts:  No  Memory:  Immediate;   Poor  Judgement:  Impaired  Insight:  Shallow  Psychomotor Activity:  Decreased  Concentration:  Concentration: Poor  Recall:  Poor  Fund of Knowledge:  Poor  Language:  Poor  Akathisia:  Negative  Handed:  Right  AIMS (if indicated):     Assets:  Leisure Time Physical Health  ADL's:  Impaired  Cognition:  Impaired,  Moderate  Sleep:  Number of Hours: 4.25     Treatment Plan Summary: Daily contact with patient to assess and evaluate symptoms and progress in treatment, Medication management and Plan To new current precautions  Staff may choose to discontinue one-to-one as patient is behaviorally calm just mentally disorganized Continue current Risperdal therapy add  low-dose modafinil and B vitamins if no response may try perphenazine by end of weekend  [facility], MD 08/02/2018, 10:23 AM

## 2018-08-02 NOTE — Progress Notes (Signed)
Pt attended spirituality group facilitated by Chaplain Ginger Leeth, MDIv, BCC. ° °Group Description: Group focused on topic of hope. Patients participated in facilitated discussion around topic, connecting with one another around experiences and definitions for hope. Group members engaged with visual explorer photos, reflecting on what hope looks like for them today. Group engaged in discussion around how their definitions of hope are present today in hospital. ° °Modalities: Psycho-social ed, Adlerian, Narrative, MI ° °Patient Progress: °Did not attend  °

## 2018-08-02 NOTE — Tx Team (Signed)
Interdisciplinary Treatment and Diagnostic Plan Update  08/02/2018 Time of Session:  Debbie Dorsey MRN: [id number]  Principal Diagnosis: <principal problem not specified>  Secondary Diagnoses: Active Problems:   Schizophrenia ([place])   Current Medications:  Current Facility-Administered Medications  Medication Dose Route Frequency Provider Last Rate Last Dose  . acetaminophen (TYLENOL) tablet 650 mg  650 mg Oral Q6H PRN Sharma Covert, MD      . alum & mag hydroxide-simeth (MAALOX/MYLANTA) 200-200-20 MG/5ML suspension 30 mL  30 mL Oral Q4H PRN Sharma Covert, MD      . hydrOXYzine (ATARAX/VISTARIL) tablet 25 mg  25 mg Oral TID PRN Sharma Covert, MD   25 mg at 07/31/18 2201  . insulin aspart (novoLOG) injection 0-9 Units  0-9 Units Subcutaneous TID WC Sharma Covert, MD   9 Units at 08/02/18 1227  . insulin glargine (LANTUS) injection 15 Units  15 Units Subcutaneous Daily Sharma Covert, MD   15 Units at 08/02/18 0831  . magnesium hydroxide (MILK OF MAGNESIA) suspension 30 mL  30 mL Oral Daily PRN Sharma Covert, MD      . modafinil (PROVIGIL) tablet 100 mg  100 mg Oral Daily Johnn Hai, MD   100 mg at 08/02/18 1356  . prenatal multivitamin tablet 1 tablet  1 tablet Oral Q1200 Johnn Hai, MD   1 tablet at 08/02/18 1229  . risperiDONE (RISPERDAL) tablet 3 mg  3 mg Oral BID Johnn Hai, MD   3 mg at 08/02/18 0830  . rivaroxaban (XARELTO) tablet 20 mg  20 mg Oral Daily Johnn Hai, MD      . simvastatin (ZOCOR) tablet 40 mg  40 mg Oral q1800 Sharma Covert, MD   40 mg at 08/01/18 1736  . traZODone (DESYREL) tablet 50 mg  50 mg Oral QHS PRN Sharma Covert, MD   50 mg at 08/01/18 2137  . ziprasidone (GEODON) injection 10 mg  10 mg Intramuscular Q6H PRN Sharma Covert, MD       PTA Medications: Medications Prior to Admission  Medication Sig Dispense Refill Last Dose  . benztropine (COGENTIN) 0.5 MG tablet Take 1 tablet (0.5 mg total) by mouth 2  (two) times daily. Restart on 06/07/2018 as per psychiatry (Patient not taking: Reported on 07/29/2018)     . benztropine (COGENTIN) 1 MG tablet Take 0.5 mg by mouth 2 (two) times a day.     . busPIRone (BUSPAR) 5 MG tablet Take 5 mg by mouth 3 (three) times daily.     . insulin NPH-regular Human (70-30) 100 UNIT/ML injection Inject 32 Units into the skin 2 (two) times daily with a meal.     . risperiDONE (RISPERDAL) 0.5 MG tablet Take 1 tablet (0.5 mg total) by mouth 2 (two) times daily. Start on 06/07/2018 as per psychiatry (Patient not taking: Reported on 07/29/2018) 30 tablet 0   . Rivaroxaban (XARELTO) 15 MG TABS tablet Take 15 mg by mouth 2 (two) times daily with a meal.     . Rivaroxaban 15 & 20 MG TBPK Take as directed on package: Start with one 94m tablet by mouth twice a day with food. On Day 22, switch to one 28mtablet once a day with food. (Patient not taking: Reported on 07/29/2018) 51 each 0   . simvastatin (ZOCOR) 40 MG tablet Take 40 mg by mouth daily at 12 noon.      . temazepam (RESTORIL) 15 MG capsule Take 1 capsule (15 mg  total) by mouth at bedtime as needed for sleep. (Patient not taking: Reported on 07/29/2018) 30 capsule 0   . traZODone (DESYREL) 100 MG tablet Take 100 mg by mouth daily.       Patient Stressors: Health problems Medication change or noncompliance Traumatic event  Patient Strengths: Supportive family/friends  Treatment Modalities: Medication Management, Group therapy, Case management,  1 to 1 session with clinician, Psychoeducation, Recreational therapy.   Physician Treatment Plan for Primary Diagnosis: <principal problem not specified> Long Term Goal(s): Improvement in symptoms so as ready for discharge Improvement in symptoms so as ready for discharge   Short Term Goals: Ability to identify changes in lifestyle to reduce recurrence of condition will improve Ability to verbalize feelings will improve Ability to disclose and discuss suicidal ideas Ability  to identify and develop effective coping behaviors will improve Ability to maintain clinical measurements within normal limits will improve Compliance with prescribed medications will improve  Medication Management: Evaluate patient's response, side effects, and tolerance of medication regimen.  Therapeutic Interventions: 1 to 1 sessions, Unit Group sessions and Medication administration.  Evaluation of Outcomes: Not Met  Physician Treatment Plan for Secondary Diagnosis: Active Problems:   Schizophrenia ([place])  Long Term Goal(s): Improvement in symptoms so as ready for discharge Improvement in symptoms so as ready for discharge   Short Term Goals: Ability to identify changes in lifestyle to reduce recurrence of condition will improve Ability to verbalize feelings will improve Ability to disclose and discuss suicidal ideas Ability to identify and develop effective coping behaviors will improve Ability to maintain clinical measurements within normal limits will improve Compliance with prescribed medications will improve     Medication Management: Evaluate patient's response, side effects, and tolerance of medication regimen.  Therapeutic Interventions: 1 to 1 sessions, Unit Group sessions and Medication administration.  Evaluation of Outcomes: Not Met   RN Treatment Plan for Primary Diagnosis: <principal problem not specified> Long Term Goal(s): Knowledge of disease and therapeutic regimen to maintain health will improve  Short Term Goals: Ability to participate in decision making will improve, Ability to verbalize feelings will improve, Ability to disclose and discuss suicidal ideas, Ability to identify and develop effective coping behaviors will improve and Compliance with prescribed medications will improve  Medication Management: RN will administer medications as ordered by provider, will assess and evaluate patient's response and provide education to patient for prescribed  medication. RN will report any adverse and/or side effects to prescribing provider.  Therapeutic Interventions: 1 on 1 counseling sessions, Psychoeducation, Medication administration, Evaluate responses to treatment, Monitor vital signs and CBGs as ordered, Perform/monitor CIWA, COWS, AIMS and Fall Risk screenings as ordered, Perform wound care treatments as ordered.  Evaluation of Outcomes: Not Met   LCSW Treatment Plan for Primary Diagnosis: <principal problem not specified> Long Term Goal(s): Safe transition to appropriate next level of care at discharge, Engage patient in therapeutic group addressing interpersonal concerns.  Short Term Goals: Engage patient in aftercare planning with referrals and resources  Therapeutic Interventions: Assess for all discharge needs, 1 to 1 time with Social worker, Explore available resources and support systems, Assess for adequacy in community support network, Educate family and significant other(s) on suicide prevention, Complete Psychosocial Assessment, Interpersonal group therapy.  Evaluation of Outcomes: Not Met   Progress in Treatment: Attending groups: No. Participating in groups: No. Taking medication as prescribed: Yes. Toleration medication: Yes. Family/Significant other contact made: No, will contact:  if patient consents to collateral contacts Patient understands diagnosis: Yes. Discussing  patient identified problems/goals with staff: Yes. Medical problems stabilized or resolved: Yes. Denies suicidal/homicidal ideation: Yes. Issues/concerns per patient self-inventory: No. Other:   New problem(s) identified: None   New Short Term/Long Term Goal(s): medication stabilization, elimination of SI thoughts, development of comprehensive mental wellness plan.    Patient Goals:    Discharge Plan or Barriers: Patient recently admitted. CSW will continue to follow and assess for appropriate referrals and possible discharge planning.     Reason for Continuation of Hospitalization: Anxiety Depression Hallucinations Medication stabilization  Estimated Length of Stay: 3-5 days   Attendees: Patient: 08/02/2018 2:10 PM  Physician: Dr. Johnn Hai, MD 08/02/2018 2:10 PM  Nursing: Pablo Lawrence.A, RN 08/02/2018 2:10 PM  RN Care Manager: 08/02/2018 2:10 PM  Social Worker: Radonna Ricker, [place] 08/02/2018 2:10 PM  Recreational Therapist:  08/02/2018 2:10 PM  Other:  08/02/2018 2:10 PM  Other:  08/02/2018 2:10 PM  Other: 08/02/2018 2:10 PM    Scribe for Treatment Team: Marylee Floras, [place] 08/02/2018 2:10 PM

## 2018-08-02 NOTE — Plan of Care (Signed)
  Problem: Activity: Goal: Sleeping patterns will improve Outcome: Progressing   Problem: Safety: Goal: Periods of time without injury will increase Outcome: Progressing   Problem: Self-Concept: Goal: Level of anxiety will decrease Outcome: Progressing   Problem: Health Behavior/Discharge Planning: Goal: Compliance with prescribed medication regimen will improve Outcome: Progressing   Problem: Safety: Goal: Ability to remain free from injury will improve Outcome: Progressing   Problem: Medication: Goal: Compliance with prescribed medication regimen will improve Outcome: Progressing

## 2018-08-02 NOTE — Progress Notes (Signed)
Patient ID: Debbie Dorsey, female   DOB: [birth year], 48 y.o.   MRN: [id number]  [place] NOVEL CORONAVIRUS (COVID-19) DAILY CHECK-OFF SYMPTOMS - answer yes or no to each - every day NO YES  Have you had a fever in the past 24 hours?  . Fever (Temp > 37.80C / 100F) X   Have you had any of these symptoms in the past 24 hours? . New Cough .  Sore Throat  .  Shortness of Breath .  Difficulty Breathing .  Unexplained Body Aches   X   Have you had any one of these symptoms in the past 24 hours not related to allergies?   . Runny Nose .  Nasal Congestion .  Sneezing   X   If you have had runny nose, nasal congestion, sneezing in the past 24 hours, has it worsened?  X   EXPOSURES - check yes or no X   Have you traveled outside the state in the past 14 days?  X   Have you been in contact with someone with a confirmed diagnosis of COVID-19 or PUI in the past 14 days without wearing appropriate PPE?  X   Have you been living in the same home as a person with confirmed diagnosis of COVID-19 or a PUI (household contact)?    X   Have you been diagnosed with COVID-19?    X              What to do next: Answered NO to all: Answered YES to anything:   Proceed with unit schedule Follow the [facility] Inpatient Flowsheet.

## 2018-08-02 NOTE — Progress Notes (Signed)
Recreation Therapy Notes  Date: 08/02/2018 Time: 10:00 am Location: [address]   Group Topic: Stress Exploration  Goal Area(s) Addresses:  Patient will work on worksheet on Stress Exploration. Patient will follow directions on first prompt.  Behavioral Response: Appropriate  Intervention: Worksheet  Activity:  Staff on [address] were provided with a worksheet on Stress Exploration. Staff was instructed to give it to the patients and have them work on it in place of [place]. Staff was also given 4 coloring sheets and were given the option to give them out.  Education:  Ability to follow Directions, Change of thought processes Discharge Planning, Goal Planning.   Education Outcome: Acknowledges education/In group clarification offered  Clinical Observations/Feedback: . Due to COVID-19, guidelines group was not held. Group members were provided a learning activity worksheet to work on the topic and above-stated goals. LRT is available to answer any questions patient may have regarding the worksheet.  Tomi Likens, LRT/CTRS         Zerick Prevette Milus Mallick 08/02/2018 12:39 PM

## 2018-08-03 LAB — GLUCOSE, CAPILLARY
Glucose-Capillary: 244 mg/dL — ABNORMAL HIGH (ref 70–99)
Glucose-Capillary: 182 mg/dL — ABNORMAL HIGH (ref 70–99)
Glucose-Capillary: 194 mg/dL — ABNORMAL HIGH (ref 70–99)
Glucose-Capillary: 156 mg/dL — ABNORMAL HIGH (ref 70–99)

## 2018-08-03 MED ORDER — INSULIN GLARGINE 100 UNIT/ML ~~LOC~~ SOLN
18.0000 [IU] | Freq: Every day | SUBCUTANEOUS | Status: DC
  Administered 2018-08-04 – 2018-08-10 (×7): 18 [IU] via SUBCUTANEOUS

## 2018-08-03 MED ORDER — RISPERIDONE 3 MG PO TABS
3.0000 mg | ORAL_TABLET | Freq: Every day | ORAL | Status: DC
  Filled 2018-08-03: qty 1

## 2018-08-03 MED ORDER — CLONIDINE HCL 0.1 MG PO TABS
0.1000 mg | ORAL_TABLET | Freq: Once | ORAL | Status: AC
  Administered 2018-08-03: 0.1 mg via ORAL
  Filled 2018-08-03 (×2): qty 1

## 2018-08-03 MED ORDER — TEMAZEPAM 15 MG PO CAPS
15.0000 mg | ORAL_CAPSULE | Freq: Every day | ORAL | Status: DC
  Administered 2018-08-03 – 2018-08-11 (×9): 15 mg via ORAL
  Filled 2018-08-03 (×8): qty 1

## 2018-08-03 NOTE — Progress Notes (Signed)
Patient is currently sitting up in bed awake with her mask on. Patient waved as Probation officer entered her room. Sitter reports that patient did not sleep at all last night and if so it was very light sleeping. 1:1 continues with sitter at bedside, patient is safe.

## 2018-08-03 NOTE — Progress Notes (Signed)
Patient ID: Debbie Dorsey, female   DOB: 02-26-1970, 48 y.o.   MRN: [id number]  [place] NOVEL CORONAVIRUS (COVID-19) DAILY CHECK-OFF SYMPTOMS - answer yes or no to each - every day NO YES  Have you had a fever in the past 24 hours?  . Fever (Temp > 37.80C / 100F) X   Have you had any of these symptoms in the past 24 hours? . New Cough .  Sore Throat  .  Shortness of Breath .  Difficulty Breathing .  Unexplained Body Aches   X   Have you had any one of these symptoms in the past 24 hours not related to allergies?   . Runny Nose .  Nasal Congestion .  Sneezing   X   If you have had runny nose, nasal congestion, sneezing in the past 24 hours, has it worsened?  X   EXPOSURES - check yes or no X   Have you traveled outside the state in the past 14 days?  X   Have you been in contact with someone with a confirmed diagnosis of COVID-19 or PUI in the past 14 days without wearing appropriate PPE?  X   Have you been living in the same home as a person with confirmed diagnosis of COVID-19 or a PUI (household contact)?    X   Have you been diagnosed with COVID-19?    X              What to do next: Answered NO to all: Answered YES to anything:   Proceed with unit schedule Follow the [facility] Inpatient Flowsheet.

## 2018-08-03 NOTE — [facility] Suicide Risk Assessment (Signed)
[place] INPATIENT:  Family/Significant Other Suicide Prevention Education  Suicide Prevention Education:  Education Completed; Debbie Dorsey [phone]),  (name of family member/significant other) has been identified by the patient as the family member/significant other with whom the patient will be residing, and identified as the person(s) who will aid the patient in the event of a mental health crisis (suicidal ideations/suicide attempt).  With written consent from the patient, the family member/significant other has been provided the following suicide prevention education, prior to the and/or following the discharge of the patient.  The suicide prevention education provided includes the following:  Suicide risk factors  Suicide prevention and interventions  National Suicide Hotline telephone number  [facility] assessment telephone number  [facility] Emergency Assistance [place] and/or Residential Mobile Crisis Unit telephone number  Request made of family/significant other to:  Remove weapons (e.g., guns, rifles, knives), all items previously/currently identified as safety concern.    Remove drugs/medications (over-the-counter, prescriptions, illicit drugs), all items previously/currently identified as a safety concern.  The family member/significant other verbalizes understanding of the suicide prevention education information provided.  The family member/significant other agrees to remove the items of safety concern listed above.  Berlin Hun Grossman-Orr 08/03/2018, 9:55 AM
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
## 2018-08-03 NOTE — Progress Notes (Signed)
Patient ID: Debbie Dorsey, female   DOB: [birth year], 48 y.o.   MRN: [id number]   1:1 RN Note  D: Pt in room lying in bed awake. Pt ate 85% of her dinner. Pt took medications and was offered a pitcher of water. Pt drank 2.5 pitchers of iced water in one setting. When asked if she was thirsty, pt responded, "Yes" and "Thank you." At that time pt's speech was clear and strong No signs of distress or discomfort noted. A: 1:1 observation continues for safety of pt. R: Pt remains safe on unit.

## 2018-08-03 NOTE — Progress Notes (Signed)
Patient is currently lying in bed awake after receiving medication for anxiety and insomnia. She has been very nervous and wants to be very close to her sitter. She has to be redirected on distancing. 1:1 continues and patient is safe with sitter at bedside

## 2018-08-03 NOTE — Progress Notes (Signed)
Patient ID: Debbie Dorsey, female   DOB: 02/21/1970, 47 y.o.   MRN: [id number]   1:1 RN Note  D: Pt in room sitting on her bed then sitting in dayroom with sitter. Pt walking in hallway, confused at times, but redirectable. No signs of distress or discomfort noted. A: 1:1 observation continues for safety of pt. R: Pt remains safe on unit.

## 2018-08-03 NOTE — Progress Notes (Signed)
Patient is currently lying in bed asleep. She has been up in the dayroom briefly tonight and ate her snack. She has little to say and will not initiate a conversation but will respond to questions being asked. Patient is safe and sitter at bedside. 1:1 continues

## 2018-08-03 NOTE — Plan of Care (Signed)
  Problem: Nutritional: Goal: Ability to achieve adequate nutritional intake will improve Outcome: Progressing   Problem: Safety: Goal: Periods of time without injury will increase Outcome: Progressing

## 2018-08-03 NOTE — Progress Notes (Signed)
Patient ID: Debbie Dorsey, female   DOB: May 22, 1970, 48 y.o.   MRN: [id number]   1:1 RN Note  D: Pt took her medications and ate lunch. Pt in dayroom with sitter watching television and working on crafts. No signs of distress or discomfort noted. A: 1:1 observation continues for safety of pt. R: Pt remains safe on unit.

## 2018-08-03 NOTE — [facility] Group Notes (Signed)
[facility] Group Notes: (Clinical Social Work)   08/03/2018      Type of Therapy:  Group Therapy   Participation Level:  Did Not Attend - was invited, chose not to attend.   Selmer Dominion, LCSW 08/03/2018, 1:08 PM

## 2018-08-03 NOTE — Progress Notes (Addendum)
[name]  08/03/2018 9:31 AM Debbie Dorsey  MRN:  [id number] Subjective:  Debbie Dorsey seen sitting in bed. 1:1 continues due to confusion, requiring frequent redirection. Per staff report she has been up and pacing the halls during the night, with only 2.75 hours of sleep recorded. She appears mildly anxious and disheveled. She continues to only speak in sentence fragments, with thought blocking, disorganized and tangential thoughts. She shakes her head when asked about AVH or SI. She is med compliant with Risperdal. Per chart review she was discharged in March 2020 on Risperdal 4 mg QHS and Restoril 15 mg QHS. Grandfather's report is that Debbie Dorsey had been doing well until Risperdal Consta was discontinued recently.   CBG 244 this morning. Blood sugars yesterday were elevated in 300s. Standing Novolog 6 units was ordered to start today with meals. Diabetes coordinator has also recommended increase in Lantus.   From admission H&P: Debbie Dorsey is a 48 YO female with a PPHx significant for schizophrenia who presented on 6/22 under IVC. The Debbie Dorsey is a poor historian and most of information is collected from old chart. Debbie Dorsey was brought to ED by family after developing "erratic behavior". She apparently attempted to jump out of the car on her way to the hospital.  Principal Problem: <principal problem not specified> Diagnosis: Active Problems:   Schizophrenia ([place])  Total Time spent with Debbie Dorsey: 20 minutes  Past Psychiatric History: See admission H&P  Past Medical History:  Past Medical History:  Diagnosis Date  . Diabetes mellitus without complication ([facility])    History reviewed. No pertinent surgical history. Family History:  Family History  Problem Relation Age of Onset  . Mental illness Sister    Family Psychiatric  History: See admission H&P Social History:  Social History   Substance and Sexual Activity  Alcohol Use No     Social History   Substance and Sexual  Activity  Drug Use No    Social History   Socioeconomic History  . Marital status: Single    Spouse name: Not on file  . Number of children: Not on file  . Years of education: Not on file  . Highest education level: Not on file  Occupational History  . Not on file  Social Needs  . Financial resource strain: Not on file  . Food insecurity    Worry: Not on file    Inability: Not on file  . Transportation needs    Medical: Not on file    Non-medical: Not on file  Tobacco Use  . Smoking status: Never Smoker  . Smokeless tobacco: Never Used  Substance and Sexual Activity  . Alcohol use: No  . Drug use: No  . Sexual activity: Not Currently  Lifestyle  . Physical activity    Days per week: Not on file    Minutes per session: Not on file  . Stress: Not on file  Relationships  . Social Herbalist on phone: Not on file    Gets together: Not on file    Attends religious service: Not on file    Active member of club or organization: Not on file    Attends meetings of clubs or organizations: Not on file    Relationship status: Not on file  Other Topics Concern  . Not on file  Social History Narrative  . Not on file   Additional Social History:  Sleep: Poor  Appetite:  Fair  Current Medications: Current Facility-Administered Medications  Medication Dose Route Frequency Provider Last Rate Last Dose  . acetaminophen (TYLENOL) tablet 650 mg  650 mg Oral Q6H PRN Sharma Covert, MD      . alum & mag hydroxide-simeth (MAALOX/MYLANTA) 200-200-20 MG/5ML suspension 30 mL  30 mL Oral Q4H PRN Sharma Covert, MD      . hydrOXYzine (ATARAX/VISTARIL) tablet 25 mg  25 mg Oral TID PRN Sharma Covert, MD   25 mg at 08/02/18 2151  . insulin aspart (novoLOG) injection 0-20 Units  0-20 Units Subcutaneous TID WC Patriciaann Clan E, PA-C   7 Units at 08/03/18 [phone]  . insulin aspart (novoLOG) injection 0-5 Units  0-5 Units Subcutaneous QHS  Laverle Hobby, PA-C   4 Units at 08/02/18 2151  . insulin aspart (novoLOG) injection 6 Units  6 Units Subcutaneous TID WC Patriciaann Clan E, PA-C   6 Units at 08/03/18 6387  . insulin glargine (LANTUS) injection 15 Units  15 Units Subcutaneous Daily Sharma Covert, MD   15 Units at 08/03/18 [phone]  . magnesium hydroxide (MILK OF MAGNESIA) suspension 30 mL  30 mL Oral Daily PRN Sharma Covert, MD      . prenatal multivitamin tablet 1 tablet  1 tablet Oral Q1200 Johnn Hai, MD   1 tablet at 08/02/18 1229  . risperiDONE (RISPERDAL) tablet 3 mg  3 mg Oral BID Johnn Hai, MD   3 mg at 08/03/18 3295  . rivaroxaban (XARELTO) tablet 20 mg  20 mg Oral Daily Johnn Hai, MD   20 mg at 08/02/18 1811  . simvastatin (ZOCOR) tablet 40 mg  40 mg Oral q1800 Sharma Covert, MD   40 mg at 08/02/18 1811  . traZODone (DESYREL) tablet 50 mg  50 mg Oral QHS PRN Sharma Covert, MD   50 mg at 08/02/18 2151  . ziprasidone (GEODON) injection 10 mg  10 mg Intramuscular Q6H PRN Sharma Covert, MD        Lab Results:  Results for orders placed or performed during the hospital encounter of 07/31/18 (from the past 48 hour(s))  Glucose, capillary     Status: Abnormal   Collection Time: 08/01/18 11:39 AM  Result Value Ref Range   Glucose-Capillary 111 (H) 70 - 99 mg/dL   Comment 1 Notify RN    Comment 2 Document in Chart   Glucose, capillary     Status: Abnormal   Collection Time: 08/01/18  5:21 PM  Result Value Ref Range   Glucose-Capillary 345 (H) 70 - 99 mg/dL  Glucose, capillary     Status: Abnormal   Collection Time: 08/01/18  6:45 PM  Result Value Ref Range   Glucose-Capillary 373 (H) 70 - 99 mg/dL   Comment 1 Notify RN    Comment 2 Document in Chart   Glucose, capillary     Status: Abnormal   Collection Time: 08/02/18  6:18 AM  Result Value Ref Range   Glucose-Capillary 265 (H) 70 - 99 mg/dL  Glucose, capillary     Status: Abnormal   Collection Time: 08/02/18 12:16 PM  Result Value  Ref Range   Glucose-Capillary 366 (H) 70 - 99 mg/dL  Glucose, capillary     Status: Abnormal   Collection Time: 08/02/18  6:05 PM  Result Value Ref Range   Glucose-Capillary 381 (H) 70 - 99 mg/dL  Glucose, capillary     Status: Abnormal   Collection  Time: 08/02/18  9:21 PM  Result Value Ref Range   Glucose-Capillary 307 (H) 70 - 99 mg/dL   Comment 1 Notify RN   Glucose, capillary     Status: Abnormal   Collection Time: 08/03/18  5:46 AM  Result Value Ref Range   Glucose-Capillary 244 (H) 70 - 99 mg/dL   Comment 1 Notify RN     Blood Alcohol level:  Lab Results  Component Value Date   ETH <10 07/28/2018   ETH <10 38/18/2993    Metabolic Disorder Labs: Lab Results  Component Value Date   HGBA1C 8.5 (H) 07/29/2018   MPG 197 07/29/2018   MPG 191.51 05/26/2018   No results found for: PROLACTIN No results found for: CHOL, TRIG, HDL, CHOLHDL, VLDL, LDLCALC  Physical Findings: AIMS: Facial and Oral Movements Muscles of Facial Expression: None, normal Lips and Perioral Area: None, normal Jaw: None, normal Tongue: None, normal,Extremity Movements Upper (arms, wrists, hands, fingers): None, normal Lower (legs, knees, ankles, toes): None, normal, Trunk Movements Neck, shoulders, hips: None, normal, Overall Severity Severity of abnormal movements (highest score from questions above): None, normal Incapacitation due to abnormal movements: None, normal Debbie Dorsey's awareness of abnormal movements (rate only Debbie Dorsey's report): No Awareness, Dental Status Current problems with teeth and/or dentures?: No Does Debbie Dorsey usually wear dentures?: No  CIWA:    COWS:     Musculoskeletal: Strength & Muscle Tone: within normal limits Gait & Station: normal Debbie Dorsey leans: N/A  Psychiatric Specialty Exam: Physical Exam  Nursing note and vitals reviewed. Respiratory: Effort normal.  Musculoskeletal: Normal range of motion.  Neurological: She is alert.    Review of Systems   Constitutional: Negative.   Psychiatric/Behavioral: Positive for hallucinations. Negative for depression, substance abuse and suicidal ideas. The Debbie Dorsey is nervous/anxious and has insomnia.     Blood pressure 101/79, heart rate 119, respirations 18, temperature 98.0.  General Appearance: Disheveled  Eye Contact:  Fair  Speech:  Blocked and Slow  Volume:  Decreased  Mood:  Anxious  Affect:  Constricted  Thought Process:  Disorganized  Orientation:  Other:  disoriented  Thought Content:  Tangential  Suicidal Thoughts:  No  Homicidal Thoughts:  No  Memory:  Immediate;   Poor  Judgement:  Impaired  Insight:  Lacking  Psychomotor Activity:  Normal  Concentration:  Concentration: Fair and Attention Span: Poor  Recall:  Poor  Fund of Knowledge:  Fair  Language:  Poor  Akathisia:  No  Handed:  Right  AIMS (if indicated):     Assets:  Catering manager Housing Leisure Time Social Support  ADL's:  Intact  Cognition:  WNL  Sleep:  Number of Hours: 2.75     Treatment Plan Summary: Daily contact with Debbie Dorsey to assess and evaluate symptoms and progress in treatment and Medication management   Continue inpatient hospitalization.  Continue Risperdal 3 mg PO BID for schizophrenia Discontinue Provigil due to insomnia Start Restoril 15 mg PO QHS for insomnia Continue trazodone 50 mg QHS PRN insomnia Continue Xarelto 20 mg PO daily for hx blood clot Continue sliding scale insulin ACHS for diabetes Continue Novolog 6 units TID with meals for diabetes Increase Lantus to 18 units SQ daily for diabetes, per diabetes coordinator recommendations Continue Vistaril 25 mg PO TID PRN anxiety Continue Zocor 40 mg PO daily for HLD Continue Geodon 10 mg IM Q6HR PRN agitation  Debbie Dorsey will participate in the therapeutic group milieu.  Discharge disposition in progress.   Connye Burkitt, NP 08/03/2018, 9:31  AM   Attest to NP Progress Note

## 2018-08-03 NOTE — Progress Notes (Signed)
Patient currently lying in bed awake. Sitter at bedside. 1:1 continues and patient is safe.

## 2018-08-04 LAB — GLUCOSE, CAPILLARY
Glucose-Capillary: 283 mg/dL — ABNORMAL HIGH (ref 70–99)
Glucose-Capillary: 168 mg/dL — ABNORMAL HIGH (ref 70–99)
Glucose-Capillary: 197 mg/dL — ABNORMAL HIGH (ref 70–99)
Glucose-Capillary: 76 mg/dL (ref 70–99)

## 2018-08-04 MED ORDER — RISPERIDONE 2 MG PO TABS
4.0000 mg | ORAL_TABLET | Freq: Every day | ORAL | Status: DC
  Administered 2018-08-04: 4 mg via ORAL
  Filled 2018-08-04 (×2): qty 2

## 2018-08-04 NOTE — Progress Notes (Signed)
Pt observed standing at the nurses station with sitter present. Pt thoughts are disorganized and speech is tangential. Pt forwards little information and respond by nodding her head yes or no. Pt remains confused and continues to require constant redirecting and prompting. Pt remains on 1:1 observation for safety.

## 2018-08-04 NOTE — [facility] Group Notes (Signed)
[facility] LCSW Group Therapy Note  Date/Time:  08/04/2018  11:00AM-12:00PM  Type of Therapy and Topic:  Group Therapy:  Music and Mood  Participation Level:  Did Not Attend   Description of Group: In this process group, members listened to a variety of genres of music and identified that different types of music evoke different responses.  Patients were encouraged to identify music that was soothing for them and music that was energizing for them.  Patients discussed how this knowledge can help with wellness and recovery in various ways including managing depression and anxiety as well as encouraging healthy sleep habits.    Therapeutic Goals: 1. Patients will explore the impact of different varieties of music on mood 2. Patients will verbalize the thoughts they have when listening to different types of music 3. Patients will identify music that is soothing to them as well as music that is energizing to them 4. Patients will discuss how to use this knowledge to assist in maintaining wellness and recovery 5. Patients will explore the use of music as a coping skill  Summary of Patient Progress:  N/A  Therapeutic Modalities: Solution Focused Brief Therapy Activity   Selmer Dominion, LCSW

## 2018-08-04 NOTE — Progress Notes (Signed)
Patient is currently lying in bed awake. She sat up at looked at Probation officer when entering her room. She was in the dayroom earlier briefly but was very anxious and kept trying to walk out of the dayroom without her sitter. She appeared uncomfortable being in the dayroom with the other patients tonight. She received her medications a little early because she was very anxious and wanted to leave the dayroom. She has had to be redirected more tonight by her sitter. Sitter at patients side and 1:1 continues for safety.

## 2018-08-04 NOTE — Progress Notes (Signed)
Patient lying in bed asleep eyes closed and no distress noted. Sitter at bedside, 1:1 continues and patient is safe.

## 2018-08-04 NOTE — Progress Notes (Signed)
Patient lying in bed asleep no distress noted. 1:1 continues and patient is safe. Sitter at bedside.

## 2018-08-04 NOTE — Progress Notes (Signed)
Pt remains confused and noted to be increasingly paranoid this afternoon. Pt observed pacing from the dayroom to the hallway. Pt observed trying to get out the front door. Pt appeared anxious and was given Vistaril. Pt requires constant redirecting and prompting. Fluids provided to pt. Pt remains on 1:1 observation for safety.

## 2018-08-04 NOTE — Progress Notes (Addendum)
[facility] MD Progress Note  08/04/2018 7:59 AM Debbie Dorsey  MRN:  [id number] Subjective:  "I'm fine."  Debbie Dorsey seen standing at the nursing station. She appears calmer and is more verbal today. She answers most questions appropriately, although only with 1-2 word answers. Nursing staff and patient both report she had improved sleep last night, with 6.75 hours recorded. She is med compliant. She is oriented to person and place but states the year is 35. She denies SI/AVH. She is still confused at times and requiring redirection and assistance with self-care activities. Her heart rate became elevated yesterday afternoon and improved with fluids.   From admission H&P: Patient is a 48 YO female with a PPHx significant for schizophrenia who presented on 6/22 under IVC. The patient is a poor historian and most of information is collected from old chart. Patient was brought to ED by family after developing "erratic behavior". She apparently attempted to jump out of the car on her way to the hospital.  Principal Problem: <principal problem not specified> Diagnosis: Active Problems:   Schizophrenia ([place])  Total Time spent with patient: 15 minutes  Past Psychiatric History: See admission H&P  Past Medical History:  Past Medical History:  Diagnosis Date  . Diabetes mellitus without complication ([place])    History reviewed. No pertinent surgical history. Family History:  Family History  Problem Relation Age of Onset  . Mental illness Sister    Family Psychiatric  History: See admission H&P Social History:  Social History   Substance and Sexual Activity  Alcohol Use No     Social History   Substance and Sexual Activity  Drug Use No    Social History   Socioeconomic History  . Marital status: Single    Spouse name: Not on file  . Number of children: Not on file  . Years of education: Not on file  . Highest education level: Not on file  Occupational History  . Not on file   Social Needs  . Financial resource strain: Not on file  . Food insecurity    Worry: Not on file    Inability: Not on file  . Transportation needs    Medical: Not on file    Non-medical: Not on file  Tobacco Use  . Smoking status: Never Smoker  . Smokeless tobacco: Never Used  Substance and Sexual Activity  . Alcohol use: No  . Drug use: No  . Sexual activity: Not Currently  Lifestyle  . Physical activity    Days per week: Not on file    Minutes per session: Not on file  . Stress: Not on file  Relationships  . Social Herbalist on phone: Not on file    Gets together: Not on file    Attends religious service: Not on file    Active member of club or organization: Not on file    Attends meetings of clubs or organizations: Not on file    Relationship status: Not on file  Other Topics Concern  . Not on file  Social History Narrative  . Not on file   Additional Social History:                         Sleep: Good  Appetite:  Good  Current Medications: Current Facility-Administered Medications  Medication Dose Route Frequency Provider Last Rate Last Dose  . acetaminophen (TYLENOL) tablet 650 mg  650 mg Oral Q6H PRN Clary,  Cordie Grice, MD      . alum & mag hydroxide-simeth (MAALOX/MYLANTA) 200-200-20 MG/5ML suspension 30 mL  30 mL Oral Q4H PRN Sharma Covert, MD      . hydrOXYzine (ATARAX/VISTARIL) tablet 25 mg  25 mg Oral TID PRN Sharma Covert, MD   25 mg at 08/03/18 1758  . insulin aspart (novoLOG) injection 0-20 Units  0-20 Units Subcutaneous TID WC Laverle Hobby, PA-C   11 Units at 08/04/18 0705  . insulin aspart (novoLOG) injection 0-5 Units  0-5 Units Subcutaneous QHS Laverle Hobby, PA-C   4 Units at 08/02/18 2151  . insulin aspart (novoLOG) injection 6 Units  6 Units Subcutaneous TID WC Patriciaann Clan E, PA-C   6 Units at 08/04/18 0768  . insulin glargine (LANTUS) injection 18 Units  18 Units Subcutaneous Daily Harriett Sine E, NP       . magnesium hydroxide (MILK OF MAGNESIA) suspension 30 mL  30 mL Oral Daily PRN Sharma Covert, MD      . prenatal multivitamin tablet 1 tablet  1 tablet Oral Q1200 Johnn Hai, MD   1 tablet at 08/03/18 1226  . risperiDONE (RISPERDAL) tablet 3 mg  3 mg Oral QHS Connye Burkitt, NP      . rivaroxaban Alveda Reasons) tablet 20 mg  20 mg Oral Daily Johnn Hai, MD   20 mg at 08/03/18 1758  . simvastatin (ZOCOR) tablet 40 mg  40 mg Oral q1800 Sharma Covert, MD   40 mg at 08/03/18 1804  . temazepam (RESTORIL) capsule 15 mg  15 mg Oral QHS Connye Burkitt, NP   15 mg at 08/03/18 2128  . traZODone (DESYREL) tablet 50 mg  50 mg Oral QHS PRN Sharma Covert, MD   50 mg at 08/02/18 2151  . ziprasidone (GEODON) injection 10 mg  10 mg Intramuscular Q6H PRN Sharma Covert, MD        Lab Results:  Results for orders placed or performed during the hospital encounter of 07/31/18 (from the past 48 hour(s))  Glucose, capillary     Status: Abnormal   Collection Time: 08/02/18 12:16 PM  Result Value Ref Range   Glucose-Capillary 366 (H) 70 - 99 mg/dL  Glucose, capillary     Status: Abnormal   Collection Time: 08/02/18  6:05 PM  Result Value Ref Range   Glucose-Capillary 381 (H) 70 - 99 mg/dL  Glucose, capillary     Status: Abnormal   Collection Time: 08/02/18  9:21 PM  Result Value Ref Range   Glucose-Capillary 307 (H) 70 - 99 mg/dL   Comment 1 Notify RN   Glucose, capillary     Status: Abnormal   Collection Time: 08/03/18  5:46 AM  Result Value Ref Range   Glucose-Capillary 244 (H) 70 - 99 mg/dL   Comment 1 Notify RN   Glucose, capillary     Status: Abnormal   Collection Time: 08/03/18 11:51 AM  Result Value Ref Range   Glucose-Capillary 182 (H) 70 - 99 mg/dL  Glucose, capillary     Status: Abnormal   Collection Time: 08/03/18  5:16 PM  Result Value Ref Range   Glucose-Capillary 194 (H) 70 - 99 mg/dL  Glucose, capillary     Status: Abnormal   Collection Time: 08/03/18  8:06 PM   Result Value Ref Range   Glucose-Capillary 156 (H) 70 - 99 mg/dL  Glucose, capillary     Status: Abnormal   Collection Time: 08/04/18  5:42 AM  Result Value Ref Range   Glucose-Capillary 283 (H) 70 - 99 mg/dL    Blood Alcohol level:  Lab Results  Component Value Date   ETH <10 07/28/2018   ETH <10 16/08/3708    Metabolic Disorder Labs: Lab Results  Component Value Date   HGBA1C 8.5 (H) 07/29/2018   MPG 197 07/29/2018   MPG 191.51 05/26/2018   No results found for: PROLACTIN No results found for: CHOL, TRIG, HDL, CHOLHDL, VLDL, LDLCALC  Physical Findings: AIMS: Facial and Oral Movements Muscles of Facial Expression: None, normal Lips and Perioral Area: None, normal Jaw: None, normal Tongue: None, normal,Extremity Movements Upper (arms, wrists, hands, fingers): None, normal Lower (legs, knees, ankles, toes): None, normal, Trunk Movements Neck, shoulders, hips: None, normal, Overall Severity Severity of abnormal movements (highest score from questions above): None, normal Incapacitation due to abnormal movements: None, normal Patient's awareness of abnormal movements (rate only patient's report): No Awareness, Dental Status Current problems with teeth and/or dentures?: No Does patient usually wear dentures?: No  CIWA:    COWS:     Musculoskeletal: Strength & Muscle Tone: within normal limits Gait & Station: normal Patient leans: N/A  Psychiatric Specialty Exam: Physical Exam  Nursing note and vitals reviewed. Constitutional: She is oriented to person, place, and time. She appears well-developed and well-nourished.  Respiratory: Effort normal.  Musculoskeletal: Normal range of motion.  Neurological: She is alert and oriented to person, place, and time.    Review of Systems  Constitutional: Negative.   Psychiatric/Behavioral: Negative for depression, hallucinations, substance abuse and suicidal ideas. The patient is not nervous/anxious and does not have insomnia.      Blood pressure 98/83, pulse (!) 121, temperature 98 F (36.7 C), temperature source Oral, resp. rate 18, height 4\' 11"  (1.499 m), weight 41.3 kg, SpO2 100 %, unknown if currently breastfeeding.Body mass index is 18.38 kg/m.  General Appearance: Disheveled  Eye Contact:  Good  Speech:  Slow  Volume:  Decreased  Mood:  Euthymic  Affect:  Flat  Thought Process:  Descriptions of Associations: Tangential  Orientation:  Other:  oriented to person and place  Thought Content:  Tangential  Suicidal Thoughts:  No  Homicidal Thoughts:  No  Memory:  Immediate;   Good Recent;   Fair  Judgement:  Impaired  Insight:  Lacking  Psychomotor Activity:  Normal  Concentration:  Concentration: Fair  Recall:  [employer] of Knowledge:  Fair  Language:  Fair  Akathisia:  No  Handed:  Right  AIMS (if indicated):     Assets:  Financial Resources/Insurance Housing Social Support  ADL's:  Impaired  Cognition:  WNL  Sleep:  Number of Hours: 6.75     Treatment Plan Summary: Daily contact with patient to assess and evaluate symptoms and progress in treatment and Medication management   Continue inpatient hospitalization.  Continue Risperdal 4 mg PO QHS for psychosis Continue Restoril 15 mg PO QHS for insomnia Continue Vistaril 25 mg PO TID PRN anxiety Continue trazodone 50 mg PO QHS PRN insomnia Continue Xarelto 20 mg PO daily for hx blood clot Continue Zocor 40 mg PO daily for HLD Continue insulin Lantus 18 units SQ daily for diabetes Continue Novolog 6 units SQ with meals for diabetes Continue sliding scale insulin with meals for diabetes Continue Geodon 10 mg IM PRN agitation  Patient will participate in the therapeutic group milieu.  Discharge disposition in progress.   Connye Burkitt, NP 08/04/2018, 7:59 AM  Attest to NP Progress Note

## 2018-08-04 NOTE — Progress Notes (Signed)
Observation note: Pt remains confused and requires prompting and reorienting. During the assessment the pt answered questions by nodding her head and would often stare at writer without answering the questions. Fluids provided to the pt and encouraged per order. Will continue to monitor pt's B/P and HR. Pt remains on 1:1 observation for safety.

## 2018-08-05 LAB — GLUCOSE, CAPILLARY
Glucose-Capillary: 202 mg/dL — ABNORMAL HIGH (ref 70–99)
Glucose-Capillary: 296 mg/dL — ABNORMAL HIGH (ref 70–99)
Glucose-Capillary: 148 mg/dL — ABNORMAL HIGH (ref 70–99)
Glucose-Capillary: 106 mg/dL — ABNORMAL HIGH (ref 70–99)

## 2018-08-05 MED ORDER — PERPHENAZINE 2 MG PO TABS
2.0000 mg | ORAL_TABLET | Freq: Three times a day (TID) | ORAL | Status: DC
  Administered 2018-08-05 – 2018-08-06 (×3): 2 mg via ORAL
  Filled 2018-08-05 (×6): qty 1

## 2018-08-05 NOTE — Progress Notes (Signed)
[place] NOVEL CORONAVIRUS (COVID-19) DAILY CHECK-OFF SYMPTOMS - answer yes or no to each - every day NO YES  Have you had a fever in the past 24 hours?  . Fever (Temp > 37.80C / 100F) X   Have you had any of these symptoms in the past 24 hours? . New Cough .  Sore Throat  .  Shortness of Breath .  Difficulty Breathing .  Unexplained Body Aches   X   Have you had any one of these symptoms in the past 24 hours not related to allergies?   . Runny Nose .  Nasal Congestion .  Sneezing   X   If you have had runny nose, nasal congestion, sneezing in the past 24 hours, has it worsened?  X   EXPOSURES - check yes or no X   Have you traveled outside the state in the past 14 days?  X   Have you been in contact with someone with a confirmed diagnosis of COVID-19 or PUI in the past 14 days without wearing appropriate PPE?  X   Have you been living in the same home as a person with confirmed diagnosis of COVID-19 or a PUI (household contact)?    X   Have you been diagnosed with COVID-19?    X              What to do next: Answered NO to all: Answered YES to anything:   Proceed with unit schedule Follow the [facility] Inpatient Flowsheet.   

## 2018-08-05 NOTE — Progress Notes (Signed)
Recreation Therapy Notes  Date: 6.29.20 Time: 1000 Location: 500 Hall Dayroom  Group Topic: Anxiety  Goal Area(s) Addresses:  Patient will identify triggers for anxiety. Patient will identify physical symptoms when anxious. Patient will identify coping strategies for anxiety.  Intervention: Worksheet  Activity:  Introduction to Anxiety.  Patients will identify things that trigger anxiety, physical symptoms to anxiety, thoughts that occur when anxious and ways to cope with anxiety.  Education: Communication, Discharge Planning  Education Outcome: Acknowledges understanding/In group clarification offered/Needs additional education.   Clinical Observations/Feedback: Pt did not attend group.    Victorino Sparrow, LRT/CTRS         Victorino Sparrow A 08/05/2018 11:16 AM

## 2018-08-05 NOTE — Progress Notes (Signed)
Nursing 1:1 Note  D: Pt observed resting in bed with eyes open. She appears confused and fearful. Pt requires firm direction from staff. Pt took scheduled medications HS. No new c/o's. Pt encourage to push fluids. Snacks and fluids given. Support and encouragement offered.  A: 1:1 observation continues for safety. Sitter within arms reach.  R: Pt remains safe at this time.

## 2018-08-05 NOTE — Progress Notes (Signed)
Patient had an accident and wet the bed. Linen was changed, patient cleaned up and was redirected to lie back down. Patient has taken in more fluids tonight and writer requested that patient be wakened every 2/3 hours for bathroom. 1:1 continues with sitter at bedside and patient is safe.

## 2018-08-05 NOTE — Progress Notes (Signed)
[facility] MD Progress Note  08/05/2018 10:20 AM Debbie Dorsey  MRN:  [id number] Subjective:    Patient remains anxious, hypotensive, and generally confused in her presentation and disorganized in her movements but she is alert now she is oriented to person and situation she tells me she is in the hospital and she will give sentence fragments but they are correct.  She is unaware of the day date or time.  But she knows where she is she denies any thoughts of harming self seems to understand the question and denies hallucinations. Principal Problem: Exacerbation of underlying psychotic disorder presenting mainly as disorganized thought and behavior/treatment complicated by baseline hypotension Diagnosis: Active Problems:   Schizophrenia ([place])  Total Time spent with patient: 20 minutes  Past Medical History:  Past Medical History:  Diagnosis Date  . Diabetes mellitus without complication ([place])    History reviewed. No pertinent surgical history. Family History:  Family History  Problem Relation Age of Onset  . Mental illness Sister    Family Psychiatric  History: neg Social History:  Social History   Substance and Sexual Activity  Alcohol Use No     Social History   Substance and Sexual Activity  Drug Use No    Social History   Socioeconomic History  . Marital status: Single    Spouse name: Not on file  . Number of children: Not on file  . Years of education: Not on file  . Highest education level: Not on file  Occupational History  . Not on file  Social Needs  . Financial resource strain: Not on file  . Food insecurity    Worry: Not on file    Inability: Not on file  . Transportation needs    Medical: Not on file    Non-medical: Not on file  Tobacco Use  . Smoking status: Never Smoker  . Smokeless tobacco: Never Used  Substance and Sexual Activity  . Alcohol use: No  . Drug use: No  . Sexual activity: Not Currently  Lifestyle  . Physical activity    Days per  week: Not on file    Minutes per session: Not on file  . Stress: Not on file  Relationships  . Social Herbalist on phone: Not on file    Gets together: Not on file    Attends religious service: Not on file    Active member of club or organization: Not on file    Attends meetings of clubs or organizations: Not on file    Relationship status: Not on file  Other Topics Concern  . Not on file  Social History Narrative  . Not on file   Additional Social History:                         Sleep: Fair  Appetite:  Fair  Current Medications: Current Facility-Administered Medications  Medication Dose Route Frequency Provider Last Rate Last Dose  . acetaminophen (TYLENOL) tablet 650 mg  650 mg Oral Q6H PRN Sharma Covert, MD      . alum & mag hydroxide-simeth (MAALOX/MYLANTA) 200-200-20 MG/5ML suspension 30 mL  30 mL Oral Q4H PRN Sharma Covert, MD      . hydrOXYzine (ATARAX/VISTARIL) tablet 25 mg  25 mg Oral TID PRN Sharma Covert, MD   25 mg at 08/04/18 1550  . insulin aspart (novoLOG) injection 0-20 Units  0-20 Units Subcutaneous TID WC Laverle Hobby,  PA-C   7 Units at 08/05/18 0636  . insulin aspart (novoLOG) injection 0-5 Units  0-5 Units Subcutaneous QHS Laverle Hobby, PA-C   4 Units at 08/02/18 2151  . insulin aspart (novoLOG) injection 6 Units  6 Units Subcutaneous TID WC Patriciaann Clan E, PA-C   6 Units at 08/05/18 [phone]  . insulin glargine (LANTUS) injection 18 Units  18 Units Subcutaneous Daily Connye Burkitt, NP   18 Units at 08/05/18 0816  . magnesium hydroxide (MILK OF MAGNESIA) suspension 30 mL  30 mL Oral Daily PRN Sharma Covert, MD      . perphenazine (TRILAFON) tablet 2 mg  2 mg Oral TID Johnn Hai, MD      . prenatal multivitamin tablet 1 tablet  1 tablet Oral Q1200 Johnn Hai, MD   1 tablet at 08/04/18 1231  . rivaroxaban (XARELTO) tablet 20 mg  20 mg Oral Daily Johnn Hai, MD   20 mg at 08/04/18 1734  . simvastatin (ZOCOR)  tablet 40 mg  40 mg Oral q1800 Sharma Covert, MD   40 mg at 08/04/18 1734  . temazepam (RESTORIL) capsule 15 mg  15 mg Oral QHS Connye Burkitt, NP   15 mg at 08/04/18 2055  . traZODone (DESYREL) tablet 50 mg  50 mg Oral QHS PRN Sharma Covert, MD   50 mg at 08/02/18 2151  . ziprasidone (GEODON) injection 10 mg  10 mg Intramuscular Q6H PRN Sharma Covert, MD        Lab Results:  Results for orders placed or performed during the hospital encounter of 07/31/18 (from the past 48 hour(s))  Glucose, capillary     Status: Abnormal   Collection Time: 08/03/18 11:51 AM  Result Value Ref Range   Glucose-Capillary 182 (H) 70 - 99 mg/dL  Glucose, capillary     Status: Abnormal   Collection Time: 08/03/18  5:16 PM  Result Value Ref Range   Glucose-Capillary 194 (H) 70 - 99 mg/dL  Glucose, capillary     Status: Abnormal   Collection Time: 08/03/18  8:06 PM  Result Value Ref Range   Glucose-Capillary 156 (H) 70 - 99 mg/dL  Glucose, capillary     Status: Abnormal   Collection Time: 08/04/18  5:42 AM  Result Value Ref Range   Glucose-Capillary 283 (H) 70 - 99 mg/dL  Glucose, capillary     Status: Abnormal   Collection Time: 08/04/18 11:49 AM  Result Value Ref Range   Glucose-Capillary 168 (H) 70 - 99 mg/dL  Glucose, capillary     Status: Abnormal   Collection Time: 08/04/18  5:00 PM  Result Value Ref Range   Glucose-Capillary 197 (H) 70 - 99 mg/dL  Glucose, capillary     Status: None   Collection Time: 08/04/18  8:29 PM  Result Value Ref Range   Glucose-Capillary 76 70 - 99 mg/dL  Glucose, capillary     Status: Abnormal   Collection Time: 08/05/18  6:02 AM  Result Value Ref Range   Glucose-Capillary 202 (H) 70 - 99 mg/dL    Blood Alcohol level:  Lab Results  Component Value Date   ETH <10 07/28/2018   ETH <10 40/81/4481    Metabolic Disorder Labs: Lab Results  Component Value Date   HGBA1C 8.5 (H) 07/29/2018   MPG 197 07/29/2018   MPG 191.51 05/26/2018   No results  found for: PROLACTIN No results found for: CHOL, TRIG, HDL, CHOLHDL, VLDL, LDLCALC  Physical Findings:  AIMS: Facial and Oral Movements Muscles of Facial Expression: None, normal Lips and Perioral Area: None, normal Jaw: None, normal Tongue: None, normal,Extremity Movements Upper (arms, wrists, hands, fingers): None, normal Lower (legs, knees, ankles, toes): None, normal, Trunk Movements Neck, shoulders, hips: None, normal, Overall Severity Severity of abnormal movements (highest score from questions above): None, normal Incapacitation due to abnormal movements: None, normal Patient's awareness of abnormal movements (rate only patient's report): No Awareness, Dental Status Current problems with teeth and/or dentures?: No Does patient usually wear dentures?: No  CIWA:    COWS:     Musculoskeletal: Strength & Muscle Tone: within normal limits Gait & Station: normal Patient leans: N/A  Psychiatric Specialty Exam: Physical Exam  ROS  Blood pressure (!) 86/63, pulse (!) 101, temperature (!) 97.2 F (36.2 C), temperature source Oral, resp. rate 18, height 4\' 11"  (1.499 m), weight 41.3 kg, SpO2 100 %, unknown if currently breastfeeding.Body mass index is 18.38 kg/m.  General Appearance: Casual  Eye Contact:  Fair  Speech:  Garbled, Slow and Slurred  Volume:  Decreased  Mood:  Dysphoric  Affect:  Restricted  Thought Process:  Disorganized  Orientation:  Other:  Person place general situation  Thought Content:  Illogical  Suicidal Thoughts:  No  Homicidal Thoughts:  No  Memory:  Immediate;   Poor  Judgement:  Poor  Insight:  Lacking  Psychomotor Activity:  Decreased  Concentration:  Concentration: Poor  Recall:  Poor  Fund of Knowledge:  Poor  Language:  Poor  Akathisia:  Negative  Handed:  Right  AIMS (if indicated):     Assets:  Resilience Social Support  ADL's:  Intact  Cognition:  WNL  Sleep:  Number of Hours: 4.25     Treatment Plan Summary: Daily contact with  patient to assess and evaluate symptoms and progress in treatment, Medication management and Plan Had responded previously to Risperdal we will try low-dose perphenazine as escalation of Risperdal would certainly drop pressure more or least give [place] that risk so we will also continue to monitor on current precautions consider modafinil  Martell Mcfadyen, MD 08/05/2018, 10:20 AM

## 2018-08-05 NOTE — Progress Notes (Signed)
1:1 Note: Patient maintained on constant supervision for safety.  Patient visible in milieu with minimal interaction.  Observed pacing the hallway.  Routine safety checks maintained every 15 minutes.  Medication given as prescribed.  Patient is safe on the unit with supervision.

## 2018-08-05 NOTE — Progress Notes (Signed)
Patient is lying in bed asleep. Writer had to take her insulin to her room because it was difficult for the sitter to get her to come to medication window. She was awake long enough to receive her insulin. 1:1 continues and patient is safe with sitter at bedside.

## 2018-08-05 NOTE — Progress Notes (Signed)
1:1 Note: Patient maintained on constant supervision for safety.  Presents with flat affect and guarded behavior.  Medication given as prescribed.  Routine safety checks maintained every 15 minutes.  Patient is safe on the unit with supervision.

## 2018-08-05 NOTE — Progress Notes (Signed)
1: 1 Note: Patient maintained on constant supervision for safety.  Denies suicidal thoughts, auditory and visual hallucinations.  Routine safety checks maintained every 15 minutes.  Medication given as prescribed.  Patient visible in milieu with minimal interactions.  Continues to need a lot of redirection on the unit.  Patient is safe on the unit.

## 2018-08-06 LAB — GLUCOSE, CAPILLARY
Glucose-Capillary: 246 mg/dL — ABNORMAL HIGH (ref 70–99)
Glucose-Capillary: 144 mg/dL — ABNORMAL HIGH (ref 70–99)
Glucose-Capillary: 86 mg/dL (ref 70–99)
Glucose-Capillary: 240 mg/dL — ABNORMAL HIGH (ref 70–99)

## 2018-08-06 MED ORDER — PERPHENAZINE 4 MG PO TABS
4.0000 mg | ORAL_TABLET | Freq: Three times a day (TID) | ORAL | Status: DC
  Administered 2018-08-06 – 2018-08-07 (×3): 4 mg via ORAL
  Filled 2018-08-06 (×6): qty 1

## 2018-08-06 NOTE — Progress Notes (Signed)
1:1 Note: Patient maintained on closed observation for safety.  Patient in and out of milieu.  Continues to need a lot of redirection on the unit.  Medication given as prescribed.  Routine safety checks maintained every 15 minutes.  Patient is safe on the unit with supervision.  Food and fluid intake encouraged.

## 2018-08-06 NOTE — Progress Notes (Signed)
Recreation Therapy Notes  Date: 6.30.20 Time: 1000 Location: 500 Hall Dayroom  Group Topic: Coping Skills  Goal Area(s) Addresses:  Patient will identify positive coping strategies. Patient will identify benefits of coping strategies. Patient will identify benefit of using coping strategies post d/c.  Intervention: Worksheet  Activity: Healthy vs. Unhealthy Coping Strategies.  Patients were to identify a problem they are currently facing.  Patients were to also identify the unhealthy coping strategies they have been using and the consequences of those unhealthy coping strategies.  Patients would then identify positive coping strategies they could use, the outcomes and the barriers that prevent them from using them.  Education: Radiographer, therapeutic, Dentist.   Education Outcome: Acknowledges understanding/In group clarification offered/Needs additional education.   Clinical Observations/Feedback:  Pt did not attend group.     Victorino Sparrow, LRT/CTRS     Victorino Sparrow A 08/06/2018 11:39 AM

## 2018-08-06 NOTE — Progress Notes (Signed)
1:1 Note: Patient maintained on constant supervision for safety.  Denies suicidal thoughts, auditory and visual hallucinations.  Medication given as prescribed.  Continues to need a lot of redirections on the unit.  Routine safety checks maintained every 15 minutes.  Support and encouragement offered as needed.  Patient is safe on the unit with supervision.

## 2018-08-06 NOTE — Progress Notes (Addendum)
1:1 note  Pt has been viewed in the milieu. Pt seems anxious and hyperactive but redirectable. Pt is non verbal. Pt still confused but pleasant and compliant with treatment plan. Pt denies any physical pain/problems. Pt safe on the unit. q65m safety checks implemented and continued. Pt complaint with medication administration. Close obs continues for safety. Will continue to monitor.

## 2018-08-06 NOTE — Progress Notes (Addendum)
Nursing CO Note  D: Pt observedresting in bed with eyes closed. Respirations even and unlabored. No distress noted.  A: 1:1 observation continues for safety. Sitter within arms reach. R:Pt remains safeat this time.

## 2018-08-06 NOTE — Progress Notes (Signed)
Patient did not attend wrap up group. 

## 2018-08-06 NOTE — Progress Notes (Signed)
Did not attend group 

## 2018-08-06 NOTE — [facility] Group Notes (Signed)
[place] LCSW Group Therapy Note  Date/Time: 08/06/2018 @ 11:00am  Type of Therapy and Topic:  Group Therapy:  Overcoming Obstacles  Participation Level:  Did not attend   Description of Group:    In this group patients will be encouraged to explore what they see as obstacles to their own wellness and recovery. They will be guided to discuss their thoughts, feelings, and behaviors related to these obstacles. The group will process together ways to cope with barriers, with attention given to specific choices patients can make. Each patient will be challenged to identify changes they are motivated to make in order to overcome their obstacles. This group will be process-oriented, with patients participating in exploration of their own experiences as well as giving and receiving support and challenge from other group members.  Therapeutic Goals: 1. Patient will identify personal and current obstacles as they relate to admission. 2. Patient will identify barriers that currently interfere with their wellness or overcoming obstacles.  3. Patient will identify feelings, thought process and behaviors related to these barriers. 4. Patient will identify two changes they are willing to make to overcome these obstacles:    Summary of Patient Progress  Pt did not attend group therapy.    Therapeutic Modalities:   Cognitive Behavioral Therapy Solution Focused Therapy Motivational Interviewing Relapse Prevention Therapy   Ardelle Anton, LCSW

## 2018-08-06 NOTE — Progress Notes (Signed)
Inpatient Diabetes Program Recommendations  AACE/ADA: New Consensus Statement on Inpatient Glycemic Control (2015)  Target Ranges:  Prepandial:   less than 140 mg/dL      Peak postprandial:   less than 180 mg/dL (1-2 hours)      Critically ill patients:  140 - 180 mg/dL   Lab Results  Component Value Date   GLUCAP 246 (H) 08/06/2018   HGBA1C 8.5 (H) 07/29/2018    Review of Glycemic Control Results for Debbie Dorsey, Debbie Dorsey (MRN [id number]) as of 08/06/2018 10:22  Ref. Range 08/05/2018 06:02 08/05/2018 11:49 08/05/2018 16:56 08/05/2018 20:56 08/06/2018 06:08  Glucose-Capillary Latest Ref Range: 70 - 99 mg/dL 202 (H) 296 (H) 148 (H) 106 (H) 246 (H)   Diabetes history: DM 2 Outpatient Diabetes medications:  Novolin 70/30  32 units bid Current orders for Inpatient glycemic control:  Novolog resistant tid with meals and HS Novolog 6 units tid with meals Lantus 18 units daily  Inpatient Diabetes Program Recommendations:    Consider increasing Lantus to 25 units daily.   Thanks  Adah Perl, RN, BC-ADM Inpatient Diabetes Coordinator Pager [phone] (8a-5p)

## 2018-08-06 NOTE — Progress Notes (Signed)
[facility] MD Progress Note  08/06/2018 8:13 AM Debbie Dorsey  MRN:  [id number] Subjective:   Continues to require near constant supervision, mumbles in very soft sentence fragments unable to answer full questions regarding orientation.  Denies wanting to harm self but again all answers unclear.  No EPS or TD Principal Problem: Exacerbation of underlying psychotic disorder Diagnosis: Active Problems:   Schizophrenia ([place])  Total Time spent with patient: 20 minutes  Past Psychiatric History: Past response to Risperdal lack of response this admission  Past Medical History:  Past Medical History:  Diagnosis Date  . Diabetes mellitus without complication ([place])    History reviewed. No pertinent surgical history. Family History:  Family History  Problem Relation Age of Onset  . Mental illness Sister    Social History:  Social History   Substance and Sexual Activity  Alcohol Use No     Social History   Substance and Sexual Activity  Drug Use No    Social History   Socioeconomic History  . Marital status: Single    Spouse name: Not on file  . Number of children: Not on file  . Years of education: Not on file  . Highest education level: Not on file  Occupational History  . Not on file  Social Needs  . Financial resource strain: Not on file  . Food insecurity    Worry: Not on file    Inability: Not on file  . Transportation needs    Medical: Not on file    Non-medical: Not on file  Tobacco Use  . Smoking status: Never Smoker  . Smokeless tobacco: Never Used  Substance and Sexual Activity  . Alcohol use: No  . Drug use: No  . Sexual activity: Not Currently  Lifestyle  . Physical activity    Days per week: Not on file    Minutes per session: Not on file  . Stress: Not on file  Relationships  . Social Herbalist on phone: Not on file    Gets together: Not on file    Attends religious service: Not on file    Active member of club or organization: Not on  file    Attends meetings of clubs or organizations: Not on file    Relationship status: Not on file  Other Topics Concern  . Not on file  Social History Narrative  . Not on file   Additional Social History:                         Sleep: Fair  Appetite:  Fair  Current Medications: Current Facility-Administered Medications  Medication Dose Route Frequency Provider Last Rate Last Dose  . acetaminophen (TYLENOL) tablet 650 mg  650 mg Oral Q6H PRN Sharma Covert, MD      . alum & mag hydroxide-simeth (MAALOX/MYLANTA) 200-200-20 MG/5ML suspension 30 mL  30 mL Oral Q4H PRN Sharma Covert, MD      . hydrOXYzine (ATARAX/VISTARIL) tablet 25 mg  25 mg Oral TID PRN Sharma Covert, MD   25 mg at 08/05/18 2111  . insulin aspart (novoLOG) injection 0-20 Units  0-20 Units Subcutaneous TID WC Patriciaann Clan E, PA-C   7 Units at 08/06/18 7371  . insulin aspart (novoLOG) injection 0-5 Units  0-5 Units Subcutaneous QHS Laverle Hobby, PA-C   4 Units at 08/02/18 2151  . insulin aspart (novoLOG) injection 6 Units  6 Units Subcutaneous TID WC  Laverle Hobby, PA-C   6 Units at 08/06/18 4944  . insulin glargine (LANTUS) injection 18 Units  18 Units Subcutaneous Daily Connye Burkitt, NP   18 Units at 08/06/18 0758  . magnesium hydroxide (MILK OF MAGNESIA) suspension 30 mL  30 mL Oral Daily PRN Sharma Covert, MD      . perphenazine (TRILAFON) tablet 4 mg  4 mg Oral TID Johnn Hai, MD      . prenatal multivitamin tablet 1 tablet  1 tablet Oral Q1200 Johnn Hai, MD   1 tablet at 08/05/18 1209  . rivaroxaban (XARELTO) tablet 20 mg  20 mg Oral Daily Johnn Hai, MD   20 mg at 08/05/18 1700  . simvastatin (ZOCOR) tablet 40 mg  40 mg Oral q1800 Sharma Covert, MD   40 mg at 08/05/18 1700  . temazepam (RESTORIL) capsule 15 mg  15 mg Oral QHS Connye Burkitt, NP   15 mg at 08/05/18 2111  . traZODone (DESYREL) tablet 50 mg  50 mg Oral QHS PRN Sharma Covert, MD   50 mg at  08/02/18 2151  . ziprasidone (GEODON) injection 10 mg  10 mg Intramuscular Q6H PRN Sharma Covert, MD        Lab Results:  Results for orders placed or performed during the hospital encounter of 07/31/18 (from the past 48 hour(s))  Glucose, capillary     Status: Abnormal   Collection Time: 08/04/18 11:49 AM  Result Value Ref Range   Glucose-Capillary 168 (H) 70 - 99 mg/dL  Glucose, capillary     Status: Abnormal   Collection Time: 08/04/18  5:00 PM  Result Value Ref Range   Glucose-Capillary 197 (H) 70 - 99 mg/dL  Glucose, capillary     Status: None   Collection Time: 08/04/18  8:29 PM  Result Value Ref Range   Glucose-Capillary 76 70 - 99 mg/dL  Glucose, capillary     Status: Abnormal   Collection Time: 08/05/18  6:02 AM  Result Value Ref Range   Glucose-Capillary 202 (H) 70 - 99 mg/dL  Glucose, capillary     Status: Abnormal   Collection Time: 08/05/18 11:49 AM  Result Value Ref Range   Glucose-Capillary 296 (H) 70 - 99 mg/dL  Glucose, capillary     Status: Abnormal   Collection Time: 08/05/18  4:56 PM  Result Value Ref Range   Glucose-Capillary 148 (H) 70 - 99 mg/dL  Glucose, capillary     Status: Abnormal   Collection Time: 08/05/18  8:56 PM  Result Value Ref Range   Glucose-Capillary 106 (H) 70 - 99 mg/dL  Glucose, capillary     Status: Abnormal   Collection Time: 08/06/18  6:08 AM  Result Value Ref Range   Glucose-Capillary 246 (H) 70 - 99 mg/dL    Blood Alcohol level:  Lab Results  Component Value Date   ETH <10 07/28/2018   ETH <10 96/75/9163    Metabolic Disorder Labs: Lab Results  Component Value Date   HGBA1C 8.5 (H) 07/29/2018   MPG 197 07/29/2018   MPG 191.51 05/26/2018   No results found for: PROLACTIN No results found for: CHOL, TRIG, HDL, CHOLHDL, VLDL, LDLCALC  Physical Findings: AIMS: Facial and Oral Movements Muscles of Facial Expression: None, normal Lips and Perioral Area: None, normal Jaw: None, normal Tongue: None,  normal,Extremity Movements Upper (arms, wrists, hands, fingers): None, normal Lower (legs, knees, ankles, toes): None, normal, Trunk Movements Neck, shoulders, hips: None, normal, Overall  Severity Severity of abnormal movements (highest score from questions above): None, normal Incapacitation due to abnormal movements: None, normal Patient's awareness of abnormal movements (rate only patient's report): No Awareness, Dental Status Current problems with teeth and/or dentures?: No Does patient usually wear dentures?: No  CIWA:    COWS:     Musculoskeletal: Strength & Muscle Tone: within normal limits Gait & Station: normal Patient leans: N/A  Psychiatric Specialty Exam: Physical Exam  ROS  Blood pressure 102/70, pulse (!) 117, temperature (!) 97.2 F (36.2 C), temperature source Oral, resp. rate 18, height 4\' 11"  (1.499 m), weight 41.3 kg, SpO2 100 %, unknown if currently breastfeeding.Body mass index is 18.38 kg/m.  General Appearance: Casual  Eye Contact:  Fair  Speech:  Garbled, Slow and Slurred  Volume:  Decreased  Mood:  Dysphoric  Affect:  Congruent and Restricted  Thought Process:  Disorganized and Descriptions of Associations: Circumstantial  Orientation:  Full (Time, Place, and Person)  Thought Content:  Illogical  Suicidal Thoughts:  No  Homicidal Thoughts:  No  Memory:  0-3  Judgement:  Impaired  Insight:  Lacking  Psychomotor Activity:  Decreased  Concentration:  Concentration: Poor  Recall:  Poor  Fund of Knowledge:  Poor  Language:  Poor  Akathisia:  Negative  Handed:  Right  AIMS (if indicated):     Assets:  Physical Health Resilience  ADL's:  Intact  Cognition:  WNL  Sleep:  Number of Hours: 6     Treatment Plan Summary: Daily contact with patient to assess and evaluate symptoms and progress in treatment, Medication management and Plan [place] Trilafon continue current precautions monitor for safety no change in treatment plan  otherwise  Saivon Prowse, MD 08/06/2018, 8:13 AM

## 2018-08-06 NOTE — Progress Notes (Signed)
[place] NOVEL CORONAVIRUS (COVID-19) DAILY CHECK-OFF SYMPTOMS - answer yes or no to each - every day NO YES  Have you had a fever in the past 24 hours?  . Fever (Temp > 37.80C / 100F) X   Have you had any of these symptoms in the past 24 hours? . New Cough .  Sore Throat  .  Shortness of Breath .  Difficulty Breathing .  Unexplained Body Aches   X   Have you had any one of these symptoms in the past 24 hours not related to allergies?   . Runny Nose .  Nasal Congestion .  Sneezing   X   If you have had runny nose, nasal congestion, sneezing in the past 24 hours, has it worsened?  X   EXPOSURES - check yes or no X   Have you traveled outside the state in the past 14 days?  X   Have you been in contact with someone with a confirmed diagnosis of COVID-19 or PUI in the past 14 days without wearing appropriate PPE?  X   Have you been living in the same home as a person with confirmed diagnosis of COVID-19 or a PUI (household contact)?    X   Have you been diagnosed with COVID-19?    X              What to do next: Answered NO to all: Answered YES to anything:   Proceed with unit schedule Follow the [facility] Inpatient Flowsheet.   

## 2018-08-06 NOTE — Progress Notes (Signed)
Nursing 1:1 Note  D: Pt observed resting in bed with eyes closed. Respirations even and unlabored. No distress noted.  A: 1:1 observation continues for safety. Sitter within arms reach.  R: Pt remains safe at this time.

## 2018-08-06 NOTE — Progress Notes (Addendum)
Nursing CO Note  D: Pt observed resting in bed with eyes open. She was more irritable and confused this evening which prompted earlier intervention of meds. Pt finally took her medications with much encouragement. Pt requires firm direction from staff. Snacks and fluids given. Support and encouragement offered. CBG was 240 at HS.  A: 1:1 observation continues for safety. Sitter within arms reach.  R: Pt remains safe at this time.

## 2018-08-07 LAB — GLUCOSE, CAPILLARY
Glucose-Capillary: 77 mg/dL (ref 70–99)
Glucose-Capillary: 77 mg/dL (ref 70–99)
Glucose-Capillary: 246 mg/dL — ABNORMAL HIGH (ref 70–99)
Glucose-Capillary: 283 mg/dL — ABNORMAL HIGH (ref 70–99)
Glucose-Capillary: 181 mg/dL — ABNORMAL HIGH (ref 70–99)

## 2018-08-07 MED ORDER — LORAZEPAM 1 MG PO TABS
2.0000 mg | ORAL_TABLET | Freq: Once | ORAL | Status: AC
  Administered 2018-08-07: 2 mg via ORAL
  Filled 2018-08-07: qty 2

## 2018-08-07 MED ORDER — RISPERIDONE 3 MG PO TABS
3.0000 mg | ORAL_TABLET | Freq: Three times a day (TID) | ORAL | Status: DC
  Administered 2018-08-07 – 2018-08-08 (×4): 3 mg via ORAL
  Filled 2018-08-07 (×8): qty 1

## 2018-08-07 NOTE — Progress Notes (Signed)
[facility] MD Progress Note  08/07/2018 8:24 AM Debbie Dorsey  MRN:  [id number] Subjective:  Patient has become more disorganized rather than improved with the medication switch to perphenazine. She is alert but unable to articulate any meaningful sentences, she is intrusive trying to touch her 1-1 sitter.  She is presumably oriented to person but again does not answer any questions. Principal Problem: Exacerbation of underlying psychotic disorder Diagnosis: Active Problems:   Schizophrenia ([place])  Total Time spent with patient: 15 minutes  Past Psychiatric History: Previous response to Risperdal but response has been elusive on this admission  Past Medical History:  Past Medical History:  Diagnosis Date  . Diabetes mellitus without complication ([place])    History reviewed. No pertinent surgical history. Family History:  Family History  Problem Relation Age of Onset  . Mental illness Sister    Social History:  Social History   Substance and Sexual Activity  Alcohol Use No     Social History   Substance and Sexual Activity  Drug Use No    Social History   Socioeconomic History  . Marital status: Single    Spouse name: Not on file  . Number of children: Not on file  . Years of education: Not on file  . Highest education level: Not on file  Occupational History  . Not on file  Social Needs  . Financial resource strain: Not on file  . Food insecurity    Worry: Not on file    Inability: Not on file  . Transportation needs    Medical: Not on file    Non-medical: Not on file  Tobacco Use  . Smoking status: Never Smoker  . Smokeless tobacco: Never Used  Substance and Sexual Activity  . Alcohol use: No  . Drug use: No  . Sexual activity: Not Currently  Lifestyle  . Physical activity    Days per week: Not on file    Minutes per session: Not on file  . Stress: Not on file  Relationships  . Social Herbalist on phone: Not on file    Gets together: Not on  file    Attends religious service: Not on file    Active member of club or organization: Not on file    Attends meetings of clubs or organizations: Not on file    Relationship status: Not on file  Other Topics Concern  . Not on file  Social History Narrative  . Not on file   Additional Social History:                         Sleep: Good  Appetite:  Fair  Current Medications: Current Facility-Administered Medications  Medication Dose Route Frequency Provider Last Rate Last Dose  . acetaminophen (TYLENOL) tablet 650 mg  650 mg Oral Q6H PRN Sharma Covert, MD      . alum & mag hydroxide-simeth (MAALOX/MYLANTA) 200-200-20 MG/5ML suspension 30 mL  30 mL Oral Q4H PRN Sharma Covert, MD      . hydrOXYzine (ATARAX/VISTARIL) tablet 25 mg  25 mg Oral TID PRN Sharma Covert, MD   25 mg at 08/07/18 0118  . insulin aspart (novoLOG) injection 0-20 Units  0-20 Units Subcutaneous TID WC Patriciaann Clan E, PA-C   3 Units at 08/06/18 1158  . insulin aspart (novoLOG) injection 0-5 Units  0-5 Units Subcutaneous QHS Laverle Hobby, PA-C   2 Units at 08/06/18 2027  .  insulin aspart (novoLOG) injection 6 Units  6 Units Subcutaneous TID WC Laverle Hobby, PA-C   Stopped at 08/06/18 1758  . insulin glargine (LANTUS) injection 18 Units  18 Units Subcutaneous Daily Connye Burkitt, NP   18 Units at 08/07/18 0818  . LORazepam (ATIVAN) tablet 2 mg  2 mg Oral Once Johnn Hai, MD      . magnesium hydroxide (MILK OF MAGNESIA) suspension 30 mL  30 mL Oral Daily PRN Sharma Covert, MD      . prenatal multivitamin tablet 1 tablet  1 tablet Oral Q1200 Johnn Hai, MD   1 tablet at 08/06/18 1159  . risperiDONE (RISPERDAL) tablet 3 mg  3 mg Oral TID Johnn Hai, MD      . rivaroxaban Alveda Reasons) tablet 20 mg  20 mg Oral Daily Johnn Hai, MD   20 mg at 08/06/18 1800  . simvastatin (ZOCOR) tablet 40 mg  40 mg Oral q1800 Sharma Covert, MD   40 mg at 08/06/18 1800  . temazepam (RESTORIL)  capsule 15 mg  15 mg Oral QHS Connye Burkitt, NP   15 mg at 08/06/18 2028  . traZODone (DESYREL) tablet 50 mg  50 mg Oral QHS PRN Sharma Covert, MD   50 mg at 08/06/18 2027  . ziprasidone (GEODON) injection 10 mg  10 mg Intramuscular Q6H PRN Sharma Covert, MD   10 mg at 08/07/18 0135    Lab Results:  Results for orders placed or performed during the hospital encounter of 07/31/18 (from the past 48 hour(s))  Glucose, capillary     Status: Abnormal   Collection Time: 08/05/18 11:49 AM  Result Value Ref Range   Glucose-Capillary 296 (H) 70 - 99 mg/dL  Glucose, capillary     Status: Abnormal   Collection Time: 08/05/18  4:56 PM  Result Value Ref Range   Glucose-Capillary 148 (H) 70 - 99 mg/dL  Glucose, capillary     Status: Abnormal   Collection Time: 08/05/18  8:56 PM  Result Value Ref Range   Glucose-Capillary 106 (H) 70 - 99 mg/dL  Glucose, capillary     Status: Abnormal   Collection Time: 08/06/18  6:08 AM  Result Value Ref Range   Glucose-Capillary 246 (H) 70 - 99 mg/dL  Glucose, capillary     Status: Abnormal   Collection Time: 08/06/18 11:23 AM  Result Value Ref Range   Glucose-Capillary 144 (H) 70 - 99 mg/dL  Glucose, capillary     Status: None   Collection Time: 08/06/18  5:06 PM  Result Value Ref Range   Glucose-Capillary 86 70 - 99 mg/dL  Glucose, capillary     Status: Abnormal   Collection Time: 08/06/18  8:00 PM  Result Value Ref Range   Glucose-Capillary 240 (H) 70 - 99 mg/dL  Glucose, capillary     Status: None   Collection Time: 08/07/18  5:50 AM  Result Value Ref Range   Glucose-Capillary 77 70 - 99 mg/dL  Glucose, capillary     Status: None   Collection Time: 08/07/18  5:55 AM  Result Value Ref Range   Glucose-Capillary 77 70 - 99 mg/dL    Blood Alcohol level:  Lab Results  Component Value Date   ETH <10 07/28/2018   ETH <10 69/67/8938    Metabolic Disorder Labs: Lab Results  Component Value Date   HGBA1C 8.5 (H) 07/29/2018   MPG 197  07/29/2018   MPG 191.51 05/26/2018   No results  found for: PROLACTIN No results found for: CHOL, TRIG, HDL, CHOLHDL, VLDL, LDLCALC  Physical Findings: AIMS: Facial and Oral Movements Muscles of Facial Expression: None, normal Lips and Perioral Area: None, normal Jaw: None, normal Tongue: None, normal,Extremity Movements Upper (arms, wrists, hands, fingers): None, normal Lower (legs, knees, ankles, toes): None, normal, Trunk Movements Neck, shoulders, hips: None, normal, Overall Severity Severity of abnormal movements (highest score from questions above): None, normal Incapacitation due to abnormal movements: None, normal Patient's awareness of abnormal movements (rate only patient's report): No Awareness, Dental Status Current problems with teeth and/or dentures?: No Does patient usually wear dentures?: No  CIWA:    COWS:     Musculoskeletal: Strength & Muscle Tone: within normal limits Gait & Station: normal Patient leans: N/A  Psychiatric Specialty Exam: Physical Exam  ROS  Blood pressure 102/70, pulse (!) 117, temperature (!) 97.2 F (36.2 C), temperature source Oral, resp. rate 18, height 4\' 11"  (1.499 m), weight 41.3 kg, SpO2 100 %, unknown if currently breastfeeding.Body mass index is 18.38 kg/m.  General Appearance: Casual  Eye Contact:  Minimal  Speech:  Slurred  Volume:  Decreased  Mood:  Dysphoric  Affect:  Constricted  Thought Process:  Disorganized  Orientation:  Other:   unknown  Thought Content:  Presumed poverty of content and disorganized thought and behavior  Suicidal Thoughts:  No  Homicidal Thoughts:  No  Memory:  Immediate;   Poor  Judgement:  Impaired  Insight:  Lacking  Psychomotor Activity:  Normal  Concentration:  Concentration: Poor  Recall:  Poor  Fund of Knowledge:  Poor  Language:  [place] today  Akathisia:  Negative  Handed:  Right  AIMS (if indicated):     Assets:  Physical Health Resilience  ADL's:  Impaired  Cognition:  Impaired,   Moderate  Sleep:  Number of Hours: 4     Treatment Plan Summary: Daily contact with patient to assess and evaluate symptoms and progress in treatment, Medication management and Plan Use lorazepam acutely continue to monitor for safety on one-to-one precautions clearly the switch to perphenazine was unsuccessful we will switch back to Risperdal although she was responding it was very slow this time  Shawna Kiener, MD 08/07/2018, 8:24 AM

## 2018-08-07 NOTE — Progress Notes (Signed)
Pt is asleep

## 2018-08-07 NOTE — Progress Notes (Signed)
1:1 close obs  Pt found in the hallway pacing. Pt compliant with medication administration. 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 Pt was witnessed speaking to her grandfather on the telephone. Pt continues to try and elope from the unit. Pt denies any physical pain or problems. Pt is redirectable. Pt continues to have poor nutrition. Pt safe on the unit. q59m safety checks implemented and continued. Will continue to monitor. Close obs continues for safety and elopement.

## 2018-08-07 NOTE — Progress Notes (Signed)
1:1 close obs  Pt has been in the milieu and appropriate. Pt has continued to improve with her medication change. Pt is eating more, not paranoid, and more talkative. Pt denies any physical symptoms. Pt continues to seem anxious, but isn't pacing or trying to elope. Pt safe on the unit. q56m safety checks implemented and continued. Will continue to monitor. 1:1 close obs continues for safety.

## 2018-08-07 NOTE — Progress Notes (Signed)
1:1 close obs note  Pt has improved this morning. Pt has become verbal, though nothing of substance still. Pt has ceased pacing the floor and trying to elope. Pt denies any complaints. Pt safe on the unit. Sitter with patient. q3m safety checks implemented and continued. Close obs continues for safety.

## 2018-08-07 NOTE — Progress Notes (Signed)
1:1 close obs note  Pt has started to pace more. Pt is intrusive, has tried to elope a few times, but is redirectable. Pt is still speaking but only discussing discharge. Pt provided snack and has since went back to bed. Pt denies any physical problems but seems anxious still. Pt safe on the unit. q37m safety checks implemented and continued. Will continue to monitor. 1:1 continues for safety and elopement.

## 2018-08-07 NOTE — Progress Notes (Signed)
Nursing CO Note  D: Pt observedlaying in bed with eyes open. No distress noted.Meds appears to be taking effect.   A: 1:1 observation continues for safety. Sitter within arms reach. R:Pt remains safeat this time.

## 2018-08-07 NOTE — Care Management (Signed)
CMA sent referral to [facility] for ACTT services.   CMA will follow up.    CMA will notify [facility] Rockne [facility]inger Care Management Assistant  Email:[email or web address] Office: [phone]

## 2018-08-07 NOTE — Progress Notes (Cosign Needed Addendum)
Nursing CO Note  D: Pt observedlaying in bed with eyes open. No distress noted. Meds appears to be taking effect.   A: 1:1 observation continues for safety. Sitter within arms reach. R:Pt remains safeat this time.

## 2018-08-07 NOTE — Progress Notes (Addendum)
Pt has not yet slept. Pt increasingly confused and agitated; not responding to direction from staff. Pt grabbed writer's arms repeatedly, attempting to elope out of room (Earlier this evening, Pt ran towards double doors). Pt observe restless and is not able to sit still. Per this reason, PRN Geodon was given per orders. Provider on call made aware. Pt took injection with a show of force present.

## 2018-08-07 NOTE — Progress Notes (Signed)
Recreation Therapy Notes  Date: 7.1.20 Time: 1015 Location: 500 [address] Dayroom  Group Topic: Communication, Team Building, Problem Solving  Goal Area(s) Addresses:  Patient will effectively work with peer towards shared goal.  Patient will identify skill used to make activity successful.  Patient will identify how skills used during activity can be used to reach post d/c goals.   Intervention: STEM Activity   Activity:  [employer].  Patients were in groups of three.  Patients were given 12 pipe cleaners.  Patients were to build a free standing tower as tall as possible.  During the course of the activity, there will be budget cuts and certain skills will be prohibited from use.   Education: Education officer, community, Dentist.   Education Outcome: Acknowledges education/In group clarification offered/Needs additional education.   Clinical Observations/Feedback:  Pt did not attend group.    Victorino Sparrow, LRT/CTRS     Victorino Sparrow A 08/07/2018 11:33 AM

## 2018-08-07 NOTE — Plan of Care (Signed)
  Problem: Education: Goal: Knowledge of [facility] Education information/materials will improve Outcome: Progressing Goal: Emotional status will improve Outcome: Progressing Goal: Mental status will improve Outcome: Progressing Goal: Verbalization of understanding the information provided will improve Outcome: Progressing   

## 2018-08-08 LAB — GLUCOSE, CAPILLARY
Glucose-Capillary: 260 mg/dL — ABNORMAL HIGH (ref 70–99)
Glucose-Capillary: 223 mg/dL — ABNORMAL HIGH (ref 70–99)
Glucose-Capillary: 120 mg/dL — ABNORMAL HIGH (ref 70–99)
Glucose-Capillary: 82 mg/dL (ref 70–99)

## 2018-08-08 MED ORDER — RISPERIDONE 2 MG PO TABS
2.0000 mg | ORAL_TABLET | Freq: Three times a day (TID) | ORAL | Status: DC
  Administered 2018-08-08 – 2018-08-09 (×3): 2 mg via ORAL
  Filled 2018-08-08 (×6): qty 1

## 2018-08-08 MED ORDER — LORAZEPAM 0.5 MG PO TABS
0.5000 mg | ORAL_TABLET | Freq: Two times a day (BID) | ORAL | Status: DC
  Administered 2018-08-08 – 2018-08-09 (×2): 0.5 mg via ORAL
  Filled 2018-08-08 (×2): qty 1

## 2018-08-08 NOTE — Progress Notes (Signed)
[facility] MD Progress Note  08/08/2018 11:18 AM Debbie Dorsey  MRN:  [id number] Subjective:  Patient continues to attempt to speak and garbles a few sentence fragments that are unintelligible, but with some effort we can discern she is oriented to general situation and place.  Has shown a little bit of improvement again the experiment of trying a different antipsychotic did not work so we went back to risperidone but her blood pressure is low so I am going to lower the dose, she is also tachycardic but her T4 is normal Principal Problem: Exacerbation of underlying psychotic disorder Diagnosis: Active Problems:   Schizophrenia ([place])  Total Time spent with patient: 20 minutes  Past Medical History:  Past Medical History:  Diagnosis Date  . Diabetes mellitus without complication ([place])    History reviewed. No pertinent surgical history. Family History:  Family History  Problem Relation Age of Onset  . Mental illness Sister    Family Psychiatric  History: neg Social History:  Social History   Substance and Sexual Activity  Alcohol Use No     Social History   Substance and Sexual Activity  Drug Use No    Social History   Socioeconomic History  . Marital status: Single    Spouse name: Not on file  . Number of children: Not on file  . Years of education: Not on file  . Highest education level: Not on file  Occupational History  . Not on file  Social Needs  . Financial resource strain: Not on file  . Food insecurity    Worry: Not on file    Inability: Not on file  . Transportation needs    Medical: Not on file    Non-medical: Not on file  Tobacco Use  . Smoking status: Never Smoker  . Smokeless tobacco: Never Used  Substance and Sexual Activity  . Alcohol use: No  . Drug use: No  . Sexual activity: Not Currently  Lifestyle  . Physical activity    Days per week: Not on file    Minutes per session: Not on file  . Stress: Not on file  Relationships  . Social  Herbalist on phone: Not on file    Gets together: Not on file    Attends religious service: Not on file    Active member of club or organization: Not on file    Attends meetings of clubs or organizations: Not on file    Relationship status: Not on file  Other Topics Concern  . Not on file  Social History Narrative  . Not on file   Additional Social History:                         Sleep: Good  Appetite:  Good  Current Medications: Current Facility-Administered Medications  Medication Dose Route Frequency Provider Last Rate Last Dose  . acetaminophen (TYLENOL) tablet 650 mg  650 mg Oral Q6H PRN Sharma Covert, MD      . alum & mag hydroxide-simeth (MAALOX/MYLANTA) 200-200-20 MG/5ML suspension 30 mL  30 mL Oral Q4H PRN Sharma Covert, MD      . hydrOXYzine (ATARAX/VISTARIL) tablet 25 mg  25 mg Oral TID PRN Sharma Covert, MD   25 mg at 08/08/18 1030  . insulin aspart (novoLOG) injection 0-20 Units  0-20 Units Subcutaneous TID WC Laverle Hobby, PA-C   11 Units at 08/08/18 [phone]  . insulin aspart (  novoLOG) injection 0-5 Units  0-5 Units Subcutaneous QHS Laverle Hobby, PA-C   2 Units at 08/06/18 2027  . insulin aspart (novoLOG) injection 6 Units  6 Units Subcutaneous TID WC Patriciaann Clan E, PA-C   6 Units at 08/08/18 [phone]  . insulin glargine (LANTUS) injection 18 Units  18 Units Subcutaneous Daily Connye Burkitt, NP   18 Units at 08/08/18 0754  . LORazepam (ATIVAN) tablet 0.5 mg  0.5 mg Oral BID Johnn Hai, MD      . magnesium hydroxide (MILK OF MAGNESIA) suspension 30 mL  30 mL Oral Daily PRN Sharma Covert, MD      . prenatal multivitamin tablet 1 tablet  1 tablet Oral Q1200 Johnn Hai, MD   1 tablet at 08/07/18 1206  . risperiDONE (RISPERDAL) tablet 2 mg  2 mg Oral TID Johnn Hai, MD      . rivaroxaban Alveda Reasons) tablet 20 mg  20 mg Oral Daily Johnn Hai, MD   20 mg at 08/07/18 1739  . simvastatin (ZOCOR) tablet 40 mg  40 mg Oral q1800  Sharma Covert, MD   40 mg at 08/07/18 1739  . temazepam (RESTORIL) capsule 15 mg  15 mg Oral QHS Connye Burkitt, NP   15 mg at 08/07/18 2139  . traZODone (DESYREL) tablet 50 mg  50 mg Oral QHS PRN Sharma Covert, MD   50 mg at 08/07/18 2139  . ziprasidone (GEODON) injection 10 mg  10 mg Intramuscular Q6H PRN Sharma Covert, MD   10 mg at 08/07/18 0135    Lab Results:  Results for orders placed or performed during the hospital encounter of 07/31/18 (from the past 48 hour(s))  Glucose, capillary     Status: Abnormal   Collection Time: 08/06/18 11:23 AM  Result Value Ref Range   Glucose-Capillary 144 (H) 70 - 99 mg/dL  Glucose, capillary     Status: None   Collection Time: 08/06/18  5:06 PM  Result Value Ref Range   Glucose-Capillary 86 70 - 99 mg/dL  Glucose, capillary     Status: Abnormal   Collection Time: 08/06/18  8:00 PM  Result Value Ref Range   Glucose-Capillary 240 (H) 70 - 99 mg/dL  Glucose, capillary     Status: None   Collection Time: 08/07/18  5:50 AM  Result Value Ref Range   Glucose-Capillary 77 70 - 99 mg/dL  Glucose, capillary     Status: None   Collection Time: 08/07/18  5:55 AM  Result Value Ref Range   Glucose-Capillary 77 70 - 99 mg/dL  Glucose, capillary     Status: Abnormal   Collection Time: 08/07/18 11:56 AM  Result Value Ref Range   Glucose-Capillary 246 (H) 70 - 99 mg/dL  Glucose, capillary     Status: Abnormal   Collection Time: 08/07/18  4:56 PM  Result Value Ref Range   Glucose-Capillary 283 (H) 70 - 99 mg/dL   Comment 1 Notify RN    Comment 2 Document in Chart   Glucose, capillary     Status: Abnormal   Collection Time: 08/07/18  9:33 PM  Result Value Ref Range   Glucose-Capillary 181 (H) 70 - 99 mg/dL  Glucose, capillary     Status: Abnormal   Collection Time: 08/08/18  6:13 AM  Result Value Ref Range   Glucose-Capillary 260 (H) 70 - 99 mg/dL    Blood Alcohol level:  Lab Results  Component Value Date   ETH <10 07/28/2018  ETH <10 81/85/6314    Metabolic Disorder Labs: Lab Results  Component Value Date   HGBA1C 8.5 (H) 07/29/2018   MPG 197 07/29/2018   MPG 191.51 05/26/2018   No results found for: PROLACTIN No results found for: CHOL, TRIG, HDL, CHOLHDL, VLDL, LDLCALC  Physical Findings: AIMS: Facial and Oral Movements Muscles of Facial Expression: None, normal Lips and Perioral Area: None, normal Jaw: None, normal Tongue: None, normal,Extremity Movements Upper (arms, wrists, hands, fingers): None, normal Lower (legs, knees, ankles, toes): None, normal, Trunk Movements Neck, shoulders, hips: None, normal, Overall Severity Severity of abnormal movements (highest score from questions above): None, normal Incapacitation due to abnormal movements: None, normal Patient's awareness of abnormal movements (rate only patient's report): No Awareness, Dental Status Current problems with teeth and/or dentures?: No Does patient usually wear dentures?: No  CIWA:    COWS:     Musculoskeletal: Strength & Muscle Tone: within normal limits Gait & Station: normal Patient leans: N/A  Psychiatric Specialty Exam: Physical Exam  ROS  Blood pressure (!) 81/70, pulse (!) 112, temperature 98.6 F (37 C), temperature source Oral, resp. rate 16, height 4\' 11"  (1.499 m), weight 41.3 kg, SpO2 100 %, unknown if currently breastfeeding.Body mass index is 18.38 kg/m.  General Appearance: Casual  Eye Contact:  Good  Speech:  Clear and Coherent  Volume:  Normal  Mood:  Dysphoric  Affect:  Congruent  Thought Process:  Disorganized and Irrelevant  Orientation:  Full (Time, Place, and Person) in general- not date  Thought Content:  Illogical  Suicidal Thoughts:  No  Homicidal Thoughts:  No  Memory:  Immediate;   Poor  Judgement:  Impaired  Insight:  Shallow  Psychomotor Activity:  Decreased  Concentration:  Concentration: Poor  Recall:  Poor  Fund of Knowledge:  Poor  Language:  Poor  Akathisia:  Negative   Handed:  Right  AIMS (if indicated):     Assets:  Physical Health Resilience  ADL's:  Impaired  Cognition:  WNL  Sleep:  Number of Hours: 4     Treatment Plan Summary: Daily contact with patient to assess and evaluate symptoms and progress in treatment and Medication management for Risperdal add low-dose lorazepam continue to monitor on current precautions  Lonnetta Kniskern, MD 08/08/2018, 11:18 AM

## 2018-08-08 NOTE — [facility] Group Notes (Signed)
[facility] [facility] LCSW Group Therapy Note  Date/Time: 08/08/2018 @ 1:00pm  Type of Therapy/Topic:  Group Therapy:  Feelings about Diagnosis  Participation Level:  Did Not Attend   Mood: Pt did not attend   Description of Group:    This group will allow patients to explore their thoughts and feelings about diagnoses they have received. Patients will be guided to explore their level of understanding and acceptance of these diagnoses. Facilitator will encourage patients to process their thoughts and feelings about the reactions of others to their diagnosis, and will guide patients in identifying ways to discuss their diagnosis with significant others in their lives. This group will be process-oriented, with patients participating in exploration of their own experiences as well as giving and receiving support and challenge from other group members.   Therapeutic Goals: 1. Patient will demonstrate understanding of diagnosis as evidence by identifying two or more symptoms of the disorder:  2. Patient will be able to express two feelings regarding the diagnosis 3. Patient will demonstrate ability to communicate their needs through discussion and/or role plays  Summary of Patient Progress:    Pt did not attend group therapy.     Therapeutic Modalities:   Cognitive Behavioral Therapy Brief Therapy Feelings Identification   Ardelle Anton, LCSW

## 2018-08-08 NOTE — Progress Notes (Signed)
1:1 close obs  Pt found in bed; compliant with medication administration. Pt continues to be disorganized, wandering, intrusive, but is redirectable at times. Pt has started to pull on the exit door again, and is still focused on leaving. Pt is verbal, but is minimal with preoccupation. Pt safe on the unit. Pt is eating better, but continues to be noncompliant considering her nutritional needs and DMII. Pt denies si/hi/ah/vh and verbally agrees to approach staff if these become apparent or before harming herself/others while at [place]. Pt provided support and encouragement. 1:1 continues for safety and elopement. Will continue to monitor.

## 2018-08-08 NOTE — Progress Notes (Signed)
Recreation Therapy Notes  Date: 7.2.20 Time: 1000 Location: 500 [address] Dayroom  Group Topic: Anger Management  Goal Area(s) Addresses:  Patient will identify triggers for anger.  Patient will identify a situation that makes them angry.  Patient will identify what other emotions comes with anger.   Intervention: Worksheet  Activity:  Introduction to Anger Management.  Patients were to identify three situations that lead to anger, what they do when angry and what problems have they run into because of anger.  Education: Anger Management, Discharge Planning   Education Outcome: Acknowledges education/In group clarification offered/Needs additional education.   Clinical Observations/Feedback: Patient did not attend group.     Victorino Sparrow, LRT/CTRS         Victorino Sparrow A 08/08/2018 10:52 AM

## 2018-08-08 NOTE — Progress Notes (Addendum)
CO Progress Note D: Pt currently wandering the hall. Patient appropriate to situation. Pt in no current distress.  A: Sitter is currently walking with pt. R: Pt remains safe on a CO per MD orders.

## 2018-08-08 NOTE — Progress Notes (Signed)
1:1 Close obs note  Sitter at bedside. Pt laying in bed asleep.  Pt safe on the unit.  q15 minutes safety checks in place.  Will continue to monitor  And implement safety and elopement precautions.

## 2018-08-08 NOTE — Progress Notes (Signed)
Pt lying in bed with eyes closed, respirations even/unlabored, no s/s of distress (a) close obs cont for pt safety (r) safety maintained.

## 2018-08-08 NOTE — Progress Notes (Signed)
Nursing Progress Note: 7p-7a D: Pt currently presents with a confused/depressed/paranoid/childlike/preoccupied affect and behavior. Interacting appropriately with the milieu. Pt reports good sleep during the previous night with current medication regimen.  A: Pt provided with medications per providers orders. Pt's labs and vitals were monitored throughout the night. Pt supported emotionally and encouraged to express concerns and questions. Pt educated on medications.  R: Pt's safety ensured with 15 minute and environmental checks. Pt currently denies SI, HI, and AVH. Pt verbally contracts to seek staff if SI,HI, or AVH occurs and to consult with staff before acting on any harmful thoughts. Will continue to monitor.   [place] NOVEL CORONAVIRUS (COVID-19) DAILY CHECK-OFF SYMPTOMS - answer yes or no to each - every day NO YES  Have you had a fever in the past 24 hours?  . Fever (Temp > 37.80C / 100F) X   Have you had any of these symptoms in the past 24 hours? . New Cough .  Sore Throat  .  Shortness of Breath .  Difficulty Breathing .  Unexplained Body Aches   X   Have you had any one of these symptoms in the past 24 hours not related to allergies?   . Runny Nose .  Nasal Congestion .  Sneezing   X   If you have had runny nose, nasal congestion, sneezing in the past 24 hours, has it worsened?  X   EXPOSURES - check yes or no X   Have you traveled outside the state in the past 14 days?  X   Have you been in contact with someone with a confirmed diagnosis of COVID-19 or PUI in the past 14 days without wearing appropriate PPE?  X   Have you been living in the same home as a person with confirmed diagnosis of COVID-19 or a PUI (household contact)?    X   Have you been diagnosed with COVID-19?    X              What to do next: Answered NO to all: Answered YES to anything:   Proceed with unit schedule Follow the [facility] Inpatient Flowsheet.

## 2018-08-09 LAB — GLUCOSE, CAPILLARY
Glucose-Capillary: 129 mg/dL — ABNORMAL HIGH (ref 70–99)
Glucose-Capillary: 354 mg/dL — ABNORMAL HIGH (ref 70–99)
Glucose-Capillary: 172 mg/dL — ABNORMAL HIGH (ref 70–99)
Glucose-Capillary: 162 mg/dL — ABNORMAL HIGH (ref 70–99)

## 2018-08-09 MED ORDER — HALOPERIDOL LACTATE 5 MG/ML IJ SOLN
5.0000 mg | Freq: Once | INTRAMUSCULAR | Status: AC
  Administered 2018-08-09: 5 mg via INTRAMUSCULAR
  Filled 2018-08-09 (×2): qty 1

## 2018-08-09 MED ORDER — LIOTHYRONINE SODIUM 25 MCG PO TABS
12.5000 ug | ORAL_TABLET | Freq: Every day | ORAL | Status: DC
  Administered 2018-08-09 – 2018-08-11 (×3): 12.5 ug via ORAL
  Filled 2018-08-09 (×4): qty 1

## 2018-08-09 MED ORDER — HALOPERIDOL 5 MG PO TABS
5.0000 mg | ORAL_TABLET | Freq: Three times a day (TID) | ORAL | Status: DC
  Administered 2018-08-09 – 2018-08-10 (×3): 5 mg via ORAL
  Filled 2018-08-09 (×9): qty 1

## 2018-08-09 NOTE — Progress Notes (Signed)
Closed observation: Patient maintained on constant supervision for safety.  Patient pacing the hallway towards the double door.  Redirected away from the door several times.  Medication given as prescribed.  Routine safety checks maintained every 15 minutes.  Patient is safe on the unit.

## 2018-08-09 NOTE — Progress Notes (Signed)
1:1 close obs  Pt has become increasing agitated during the morning, trying to elope again and intrusive with boundary violations with staff. Pt was provided IM medication. Pt resting now. Pt ate lunch before resting. Pt still has poor eating habits considering her DMII. Pt doesn't seem in distress from bilateral chest expasions, equal and unlabored. Pt safe on the unit. Close obs continues for safety and elopement precautions.

## 2018-08-09 NOTE — Progress Notes (Signed)
[facility] MD Progress Note  08/09/2018 10:00 AM Debbie Dorsey  MRN:  [id number] Subjective:    Patient is mute today she requires constant redirection she is trying to elope from the unit when she sees the door opening.  Again more disorganized than her usual presentation not speaking at all today.  Blood pressure low limiting her use of compounds but we will switch her to Haldol as an experiment she had done poorly with perphenazine but she is not responding well to the Risperdal though it has been her long-term medication.  Due to the catatonic symptoms/to include catatonic agitation and mutism we will try low-dose Cytomel as well Principal Problem: Masturbation of underlying psychotic disorder Diagnosis: Active Problems:   Schizophrenia ([place])  Total Time spent with patient: 20 minutes  Past Medical History:  Past Medical History:  Diagnosis Date  . Diabetes mellitus without complication ([place])    History reviewed. No pertinent surgical history. Family History:  Family History  Problem Relation Age of Onset  . Mental illness Sister   Social History:  Social History   Substance and Sexual Activity  Alcohol Use No     Social History   Substance and Sexual Activity  Drug Use No    Social History   Socioeconomic History  . Marital status: Single    Spouse name: Not on file  . Number of children: Not on file  . Years of education: Not on file  . Highest education level: Not on file  Occupational History  . Not on file  Social Needs  . Financial resource strain: Not on file  . Food insecurity    Worry: Not on file    Inability: Not on file  . Transportation needs    Medical: Not on file    Non-medical: Not on file  Tobacco Use  . Smoking status: Never Smoker  . Smokeless tobacco: Never Used  Substance and Sexual Activity  . Alcohol use: No  . Drug use: No  . Sexual activity: Not Currently  Lifestyle  . Physical activity    Days per week: Not on file    Minutes per  session: Not on file  . Stress: Not on file  Relationships  . Social Herbalist on phone: Not on file    Gets together: Not on file    Attends religious service: Not on file    Active member of club or organization: Not on file    Attends meetings of clubs or organizations: Not on file    Relationship status: Not on file  Other Topics Concern  . Not on file  Social History Narrative  . Not on file   Additional Social History:                         Sleep: Good  Appetite:  Good  Current Medications: Current Facility-Administered Medications  Medication Dose Route Frequency Provider Last Rate Last Dose  . acetaminophen (TYLENOL) tablet 650 mg  650 mg Oral Q6H PRN Sharma Covert, MD      . alum & mag hydroxide-simeth (MAALOX/MYLANTA) 200-200-20 MG/5ML suspension 30 mL  30 mL Oral Q4H PRN Sharma Covert, MD      . haloperidol (HALDOL) tablet 5 mg  5 mg Oral TID Johnn Hai, MD      . hydrOXYzine (ATARAX/VISTARIL) tablet 25 mg  25 mg Oral TID PRN Sharma Covert, MD   25 mg at  08/08/18 2137  . insulin aspart (novoLOG) injection 0-20 Units  0-20 Units Subcutaneous TID WC Patriciaann Clan E, PA-C   3 Units at 08/09/18 0854  . insulin aspart (novoLOG) injection 0-5 Units  0-5 Units Subcutaneous QHS Laverle Hobby, PA-C   2 Units at 08/06/18 2027  . insulin aspart (novoLOG) injection 6 Units  6 Units Subcutaneous TID WC Patriciaann Clan E, PA-C   6 Units at 08/09/18 [phone]  . insulin glargine (LANTUS) injection 18 Units  18 Units Subcutaneous Daily Connye Burkitt, NP   18 Units at 08/09/18 0853  . liothyronine (CYTOMEL) tablet 12.5 mcg  12.5 mcg Oral Daily Johnn Hai, MD      . magnesium hydroxide (MILK OF MAGNESIA) suspension 30 mL  30 mL Oral Daily PRN Sharma Covert, MD      . prenatal multivitamin tablet 1 tablet  1 tablet Oral Q1200 Johnn Hai, MD   1 tablet at 08/08/18 1158  . rivaroxaban (XARELTO) tablet 20 mg  20 mg Oral Daily Johnn Hai, MD    20 mg at 08/08/18 1827  . simvastatin (ZOCOR) tablet 40 mg  40 mg Oral q1800 Sharma Covert, MD   40 mg at 08/08/18 1826  . temazepam (RESTORIL) capsule 15 mg  15 mg Oral QHS Connye Burkitt, NP   15 mg at 08/08/18 2137  . traZODone (DESYREL) tablet 50 mg  50 mg Oral QHS PRN Sharma Covert, MD   50 mg at 08/08/18 2137  . ziprasidone (GEODON) injection 10 mg  10 mg Intramuscular Q6H PRN Sharma Covert, MD   10 mg at 08/08/18 1221    Lab Results:  Results for orders placed or performed during the hospital encounter of 07/31/18 (from the past 48 hour(s))  Glucose, capillary     Status: Abnormal   Collection Time: 08/07/18 11:56 AM  Result Value Ref Range   Glucose-Capillary 246 (H) 70 - 99 mg/dL  Glucose, capillary     Status: Abnormal   Collection Time: 08/07/18  4:56 PM  Result Value Ref Range   Glucose-Capillary 283 (H) 70 - 99 mg/dL   Comment 1 Notify RN    Comment 2 Document in Chart   Glucose, capillary     Status: Abnormal   Collection Time: 08/07/18  9:33 PM  Result Value Ref Range   Glucose-Capillary 181 (H) 70 - 99 mg/dL  Glucose, capillary     Status: Abnormal   Collection Time: 08/08/18  6:13 AM  Result Value Ref Range   Glucose-Capillary 260 (H) 70 - 99 mg/dL  Glucose, capillary     Status: Abnormal   Collection Time: 08/08/18 11:53 AM  Result Value Ref Range   Glucose-Capillary 223 (H) 70 - 99 mg/dL   Comment 1 Notify RN    Comment 2 Document in Chart   Glucose, capillary     Status: Abnormal   Collection Time: 08/08/18  6:09 PM  Result Value Ref Range   Glucose-Capillary 120 (H) 70 - 99 mg/dL  Glucose, capillary     Status: None   Collection Time: 08/08/18  9:00 PM  Result Value Ref Range   Glucose-Capillary 82 70 - 99 mg/dL  Glucose, capillary     Status: Abnormal   Collection Time: 08/09/18  6:14 AM  Result Value Ref Range   Glucose-Capillary 129 (H) 70 - 99 mg/dL    Blood Alcohol level:  Lab Results  Component Value Date   ETH <10 07/28/2018  ETH <10 78/67/6720    Metabolic Disorder Labs: Lab Results  Component Value Date   HGBA1C 8.5 (H) 07/29/2018   MPG 197 07/29/2018   MPG 191.51 05/26/2018   No results found for: PROLACTIN No results found for: CHOL, TRIG, HDL, CHOLHDL, VLDL, LDLCALC  Physical Findings: AIMS: Facial and Oral Movements Muscles of Facial Expression: None, normal Lips and Perioral Area: None, normal Jaw: None, normal Tongue: None, normal,Extremity Movements Upper (arms, wrists, hands, fingers): None, normal Lower (legs, knees, ankles, toes): None, normal, Trunk Movements Neck, shoulders, hips: None, normal, Overall Severity Severity of abnormal movements (highest score from questions above): None, normal Incapacitation due to abnormal movements: None, normal Patient's awareness of abnormal movements (rate only patient's report): No Awareness, Dental Status Current problems with teeth and/or dentures?: No Does patient usually wear dentures?: No  CIWA:    COWS:     Musculoskeletal: Strength & Muscle Tone: within normal limits Gait & Station: normal Patient leans: N/A  Psychiatric Specialty Exam: Physical Exam  ROS  Blood pressure (!) 81/70, pulse (!) 112, temperature 98.6 F (37 C), temperature source Oral, resp. rate 16, height 4\' 11"  (1.499 m), weight 41.3 kg, SpO2 100 %, unknown if currently breastfeeding.Body mass index is 18.38 kg/m.  General Appearance: Disheveled  Eye Contact:  Fair  Speech:  mute  Volume:  mute  Mood:  Dysphoric  Affect:  Flat  Thought Process:  Disorganized  Orientation:  Other:  ukn  Thought Content:  Illogical  Suicidal Thoughts:  No  Homicidal Thoughts:  No  Memory:  Immediate;   ukn  Judgement:  Impaired  Insight:  Lacking  Psychomotor Activity:  Normal  Concentration:  Attention Span: Poor  Recall:  Poor  Fund of Knowledge:  Poor  Language:  NA  Akathisia:  Negative  Handed:  Right  AIMS (if indicated):     Assets:  Physical Health Resilience   ADL's:  Intact  Cognition:  WNL  Sleep:  Number of Hours: 6.5     Treatment Plan Summary: Daily contact with patient to assess and evaluate symptoms and progress in treatment, Medication management and Plan Try Cytomel for element of mutism/may be drifting towards catatonic excitement rather than catatonic depression at any rate we will try Haldol for a day and see if that does not improve things better than the Risperdal continue current supervision level  [place], MD 08/09/2018, 10:00 AM

## 2018-08-09 NOTE — Progress Notes (Signed)
CO Progress Note D: Pt currently sleeping in the bed. Patient appropriate to situation. Pt in no current distress.  A: No sitter required due to CO while awake  R: Pt remains safe on the unit.

## 2018-08-09 NOTE — Progress Notes (Signed)
Nursing Progress Note: 7p-7a D: Pt currently presents with a confused/depressed/paranoid/childlike/preoccupied/impulsive affect and behavior. Interacting appropriately with the milieu. Pt reports good sleep during the previous night with current medication regimen.  A: Pt provided with medications per providers orders. Pt's labs and vitals were monitored throughout the night. Pt supported emotionally and encouraged to express concerns and questions. Pt educated on medications.  R: Pt's safety ensured with 15 minute and environmental checks. Pt currently denies SI, HI, and AVH. Pt verbally contracts to seek staff if SI,HI, or AVH occurs and to consult with staff before acting on any harmful thoughts. Will continue to monitor.  [place] NOVEL CORONAVIRUS (COVID-19) DAILY CHECK-OFF SYMPTOMS - answer yes or no to each - every day NO YES  Have you had a fever in the past 24 hours?  . Fever (Temp > 37.80C / 100F) X   Have you had any of these symptoms in the past 24 hours? . New Cough .  Sore Throat  .  Shortness of Breath .  Difficulty Breathing .  Unexplained Body Aches   X   Have you had any one of these symptoms in the past 24 hours not related to allergies?   . Runny Nose .  Nasal Congestion .  Sneezing   X   If you have had runny nose, nasal congestion, sneezing in the past 24 hours, has it worsened?  X   EXPOSURES - check yes or no X   Have you traveled outside the state in the past 14 days?  X   Have you been in contact with someone with a confirmed diagnosis of COVID-19 or PUI in the past 14 days without wearing appropriate PPE?  X   Have you been living in the same home as a person with confirmed diagnosis of COVID-19 or a PUI (household contact)?    X   Have you been diagnosed with COVID-19?    X              What to do next: Answered NO to all: Answered YES to anything:   Proceed with unit schedule Follow the [facility] Inpatient Flowsheet.

## 2018-08-09 NOTE — Progress Notes (Signed)
Closed Observation: Patient maintained on closed observation for safety.  Medication given as prescribed.  Routine safety checks maintained every 15 minutes.  Support and encouragement offered.  Patient is in bed with eyes closed.  Patient is safe on the unit with supervision.

## 2018-08-09 NOTE — Progress Notes (Signed)
Recreation Therapy Notes  Date: 7.3.20 Time: 1000 Location: 500 Hall Dayroom  Group Topic: Goal Setting  Goal Area(s) Addresses:  Patient will be able to identify at least 3 life goals.  Patient will be able to identify benefit of investing in life goals.  Patient will be able to identify benefit of setting life goals.   Intervention:  Worksheet  Activity: Lobbyist.  Pt will identify goals they wish to accomplish in the next week, month, year and five years.  Pt will also identify obstacles to reaching goals, what they need to achieve goals and what they can start doing now to work towards goals.  Education:  Discharge Planning, Radiographer, therapeutic, Leisure Education   Education Outcome: Acknowledges Education/In Group Clarification Provided/Needs Additional Education  Clinical Observations:  Pt did not attend group.    Victorino Sparrow, LRT/CTRS     [name], Elesa Garman A 08/09/2018 10:58 AM

## 2018-08-09 NOTE — Progress Notes (Signed)
1:1 Note: Patient maintained on constant supervision for safety.  Medication given as prescribed.  Routine safety checks maintained every 15 minutes.  Patient observed pacing the hallway.  Continues to need a lot of redirection on the unit.  Patient is safe on the unit with supervision.

## 2018-08-09 NOTE — Progress Notes (Signed)
1:1 close obs.   Pt awoke, and became increasing agitated. Pt is redirectable but continues to pull on the exit door. Pt is still verbal, but is fixated on "let's go" and "come here". Pt addresses education with "yeaaa" but this writer sees no evidence of learning. Pt's close obs continues for safety and elopement precaution. Pt safe on the unit. q54m safety checks implemented and continued. Will continue to monitor.

## 2018-08-10 LAB — GLUCOSE, CAPILLARY
Glucose-Capillary: 188 mg/dL — ABNORMAL HIGH (ref 70–99)
Glucose-Capillary: 298 mg/dL — ABNORMAL HIGH (ref 70–99)
Glucose-Capillary: 149 mg/dL — ABNORMAL HIGH (ref 70–99)
Glucose-Capillary: 147 mg/dL — ABNORMAL HIGH (ref 70–99)
Glucose-Capillary: 215 mg/dL — ABNORMAL HIGH (ref 70–99)

## 2018-08-10 MED ORDER — HALOPERIDOL 5 MG PO TABS
7.5000 mg | ORAL_TABLET | Freq: Three times a day (TID) | ORAL | Status: DC
  Administered 2018-08-10 – 2018-08-11 (×2): 7.5 mg via ORAL
  Filled 2018-08-10 (×5): qty 1.5

## 2018-08-10 MED ORDER — INSULIN GLARGINE 100 UNIT/ML ~~LOC~~ SOLN
22.0000 [IU] | Freq: Every day | SUBCUTANEOUS | Status: DC
  Administered 2018-08-11 – 2018-08-23 (×13): 22 [IU] via SUBCUTANEOUS

## 2018-08-10 MED ORDER — GABAPENTIN 100 MG PO CAPS
100.0000 mg | ORAL_CAPSULE | Freq: Three times a day (TID) | ORAL | Status: DC
  Administered 2018-08-10 – 2018-08-11 (×3): 100 mg via ORAL
  Filled 2018-08-10 (×7): qty 1

## 2018-08-10 NOTE — Progress Notes (Addendum)
Patient ID: Debbie Dorsey, female   DOB: 04-23-1970, 48 y.o.   MRN: [id number]    1:1 RN note  D: Pt laying in bed awake with no signs of distress or discomfort noted. Sitter with pt. A: 1:1 observation continues for safety. R: Pt remains safe on the unit.

## 2018-08-10 NOTE — Progress Notes (Signed)
Pt refuses to keep hand off staff. Pt pushing and pulling staff and running in room. IM Geodon administered. Will continue to monitor.

## 2018-08-10 NOTE — Progress Notes (Signed)
Patient ID: Debbie Dorsey, female   DOB: [birth year], 48 y.o.   MRN: [id number]  [place] NOVEL CORONAVIRUS (COVID-19) DAILY CHECK-OFF SYMPTOMS - answer yes or no to each - every day NO YES  Have you had a fever in the past 24 hours?  . Fever (Temp > 37.80C / 100F) X   Have you had any of these symptoms in the past 24 hours? . New Cough .  Sore Throat  .  Shortness of Breath .  Difficulty Breathing .  Unexplained Body Aches   X   Have you had any one of these symptoms in the past 24 hours not related to allergies?   . Runny Nose .  Nasal Congestion .  Sneezing   X   If you have had runny nose, nasal congestion, sneezing in the past 24 hours, has it worsened?  X   EXPOSURES - check yes or no X   Have you traveled outside the state in the past 14 days?  X   Have you been in contact with someone with a confirmed diagnosis of COVID-19 or PUI in the past 14 days without wearing appropriate PPE?  X   Have you been living in the same home as a person with confirmed diagnosis of COVID-19 or a PUI (household contact)?    X   Have you been diagnosed with COVID-19?    X              What to do next: Answered NO to all: Answered YES to anything:   Proceed with unit schedule Follow the [facility] Inpatient Flowsheet.

## 2018-08-10 NOTE — Progress Notes (Signed)
COProgress Note D:Pt currently sleeping in the bed. Patient appropriate to situation. Pt in no current distress.  A: No sitter required due to CO while awake  R:Pt remains safe on the unit.

## 2018-08-10 NOTE — [facility] Group Notes (Signed)
Group was deferred for outdoor time today.  Selmer Dominion, LCSW 08/10/2018, 12:04 PM

## 2018-08-10 NOTE — Progress Notes (Signed)
Patient ID: ILAH BOULE, female   DOB: Jun 09, 1970, 48 y.o.   MRN: [id number]   1:1 RN note  D: Pt going in and out of her room and constantly being redirected to refrain from touching sitter. Pt. whispering at times then not speaking at all, only using hand gestures. No signs of distress or discomfort noted. Sitter with pt. A: 1:1 observation continues for safety. R: Pt remains safe on the unit.

## 2018-08-10 NOTE — Progress Notes (Signed)
[facility] MD Progress Note  08/10/2018 11:57 AM Debbie Dorsey  MRN:  [id number] Subjective: Patient is a 48 year old female with a past psychiatric history significant for schizophrenia who was transferred from the [facility] medical floor on 08/01/2018.  She had been admitted to the medical floor secondary to diabetic ketoacidosis and then was transferred to our facility.  Objective: Patient is seen and examined.  Patient is a 48 year old female with the above-stated past psychiatric history who is seen in follow-up.  I am familiar with the patient from having done the psychiatric consultation on her when she was at [facility].  She appears to be essentially the same as she was at that point.  She is fairly selectively mute.  Nursing staff stated that she continues to be significantly restless.  She will lay in the bed, get back up and move around.  Previously she had apparently been stable on Risperdal, and was restarted on this.  It did not appear to be beneficial and medications were switched.  She is currently on haloperidol 5 mg p.o. 3 times daily.  Low-dose Cytomel was added secondary to her catatonic agitation as well as mutism.  Her blood sugar is still poorly controlled.  Her glucose this morning is 298.  She is mildly hypotensive at 92/67, mildly tachycardic at a rate of 108.  Nursing notes reflect she only slept 4.25 hours last night.  Principal Problem: <principal problem not specified> Diagnosis: Active Problems:   Schizophrenia ([place])  Total Time spent with patient: 15 minutes  Past Psychiatric History: See admission H&P  Past Medical History:  Past Medical History:  Diagnosis Date  . Diabetes mellitus without complication ([place])    History reviewed. No pertinent surgical history. Family History:  Family History  Problem Relation Age of Onset  . Mental illness Sister    Family Psychiatric  History: See admission H&P Social History:  Social History    Substance and Sexual Activity  Alcohol Use No     Social History   Substance and Sexual Activity  Drug Use No    Social History   Socioeconomic History  . Marital status: Single    Spouse name: Not on file  . Number of children: Not on file  . Years of education: Not on file  . Highest education level: Not on file  Occupational History  . Not on file  Social Needs  . Financial resource strain: Not on file  . Food insecurity    Worry: Not on file    Inability: Not on file  . Transportation needs    Medical: Not on file    Non-medical: Not on file  Tobacco Use  . Smoking status: Never Smoker  . Smokeless tobacco: Never Used  Substance and Sexual Activity  . Alcohol use: No  . Drug use: No  . Sexual activity: Not Currently  Lifestyle  . Physical activity    Days per week: Not on file    Minutes per session: Not on file  . Stress: Not on file  Relationships  . Social Herbalist on phone: Not on file    Gets together: Not on file    Attends religious service: Not on file    Active member of club or organization: Not on file    Attends meetings of clubs or organizations: Not on file    Relationship status: Not on file  Other Topics Concern  . Not on file  Social  History Narrative  . Not on file   Additional Social History:                         Sleep: Fair  Appetite:  Fair  Current Medications: Current Facility-Administered Medications  Medication Dose Route Frequency Provider Last Rate Last Dose  . acetaminophen (TYLENOL) tablet 650 mg  650 mg Oral Q6H PRN Sharma Covert, MD      . alum & mag hydroxide-simeth (MAALOX/MYLANTA) 200-200-20 MG/5ML suspension 30 mL  30 mL Oral Q4H PRN Sharma Covert, MD      . haloperidol (HALDOL) tablet 5 mg  5 mg Oral TID Johnn Hai, MD   5 mg at 08/10/18 0836  . hydrOXYzine (ATARAX/VISTARIL) tablet 25 mg  25 mg Oral TID PRN Sharma Covert, MD   25 mg at 08/09/18 1953  . insulin aspart  (novoLOG) injection 0-20 Units  0-20 Units Subcutaneous TID WC Patriciaann Clan E, PA-C   4 Units at 08/10/18 [phone]  . insulin aspart (novoLOG) injection 0-5 Units  0-5 Units Subcutaneous QHS Laverle Hobby, PA-C   2 Units at 08/06/18 2027  . insulin aspart (novoLOG) injection 6 Units  6 Units Subcutaneous TID WC Patriciaann Clan E, PA-C   Stopped at 08/10/18 0630  . insulin glargine (LANTUS) injection 18 Units  18 Units Subcutaneous Daily Connye Burkitt, NP   18 Units at 08/10/18 0836  . liothyronine (CYTOMEL) tablet 12.5 mcg  12.5 mcg Oral Daily Johnn Hai, MD   12.5 mcg at 08/10/18 0836  . magnesium hydroxide (MILK OF MAGNESIA) suspension 30 mL  30 mL Oral Daily PRN Sharma Covert, MD      . prenatal multivitamin tablet 1 tablet  1 tablet Oral Q1200 Johnn Hai, MD   1 tablet at 08/09/18 1232  . rivaroxaban (XARELTO) tablet 20 mg  20 mg Oral Daily Johnn Hai, MD   20 mg at 08/09/18 1701  . simvastatin (ZOCOR) tablet 40 mg  40 mg Oral q1800 Sharma Covert, MD   40 mg at 08/09/18 1701  . temazepam (RESTORIL) capsule 15 mg  15 mg Oral QHS Connye Burkitt, NP   15 mg at 08/09/18 1953  . traZODone (DESYREL) tablet 50 mg  50 mg Oral QHS PRN Sharma Covert, MD   50 mg at 08/09/18 1953  . ziprasidone (GEODON) injection 10 mg  10 mg Intramuscular Q6H PRN Sharma Covert, MD   10 mg at 08/10/18 0049    Lab Results:  Results for orders placed or performed during the hospital encounter of 07/31/18 (from the past 48 hour(s))  Glucose, capillary     Status: Abnormal   Collection Time: 08/08/18  6:09 PM  Result Value Ref Range   Glucose-Capillary 120 (H) 70 - 99 mg/dL  Glucose, capillary     Status: None   Collection Time: 08/08/18  9:00 PM  Result Value Ref Range   Glucose-Capillary 82 70 - 99 mg/dL  Glucose, capillary     Status: Abnormal   Collection Time: 08/09/18  6:14 AM  Result Value Ref Range   Glucose-Capillary 129 (H) 70 - 99 mg/dL  Glucose, capillary     Status: Abnormal    Collection Time: 08/09/18 12:14 PM  Result Value Ref Range   Glucose-Capillary 354 (H) 70 - 99 mg/dL  Glucose, capillary     Status: Abnormal   Collection Time: 08/09/18  5:00 PM  Result Value Ref  Range   Glucose-Capillary 172 (H) 70 - 99 mg/dL   Comment 1 Notify RN    Comment 2 Document in Chart   Glucose, capillary     Status: Abnormal   Collection Time: 08/09/18  7:41 PM  Result Value Ref Range   Glucose-Capillary 162 (H) 70 - 99 mg/dL  Glucose, capillary     Status: Abnormal   Collection Time: 08/10/18  5:57 AM  Result Value Ref Range   Glucose-Capillary 188 (H) 70 - 99 mg/dL   Comment 1 Notify RN   Glucose, capillary     Status: Abnormal   Collection Time: 08/10/18  8:00 AM  Result Value Ref Range   Glucose-Capillary 298 (H) 70 - 99 mg/dL    Blood Alcohol level:  Lab Results  Component Value Date   ETH <10 07/28/2018   ETH <10 82/50/5397    Metabolic Disorder Labs: Lab Results  Component Value Date   HGBA1C 8.5 (H) 07/29/2018   MPG 197 07/29/2018   MPG 191.51 05/26/2018   No results found for: PROLACTIN No results found for: CHOL, TRIG, HDL, CHOLHDL, VLDL, LDLCALC  Physical Findings: AIMS: Facial and Oral Movements Muscles of Facial Expression: None, normal Lips and Perioral Area: None, normal Jaw: None, normal Tongue: None, normal,Extremity Movements Upper (arms, wrists, hands, fingers): None, normal Lower (legs, knees, ankles, toes): None, normal, Trunk Movements Neck, shoulders, hips: None, normal, Overall Severity Severity of abnormal movements (highest score from questions above): None, normal Incapacitation due to abnormal movements: None, normal Patient's awareness of abnormal movements (rate only patient's report): No Awareness, Dental Status Current problems with teeth and/or dentures?: No Does patient usually wear dentures?: No  CIWA:    COWS:     Musculoskeletal: Strength & Muscle Tone: decreased Gait & Station: unsteady Patient leans:  N/A  Psychiatric Specialty Exam: Physical Exam  Constitutional: She appears well-developed.  HENT:  Head: Normocephalic and atraumatic.  Respiratory: Effort normal.  Neurological: She is alert.    ROS  Blood pressure 92/67, pulse (!) 108, temperature 98.6 F (37 C), temperature source Oral, resp. rate 16, height 4\' 11"  (1.499 m), weight 41.3 kg, SpO2 100 %, unknown if currently breastfeeding.Body mass index is 18.38 kg/m.  General Appearance: Disheveled  Eye Contact:  Fair  Speech:  Slow  Volume:  Decreased  Mood:  Dysphoric  Affect:  Flat  Thought Process:  Linear and Descriptions of Associations: Loose  Orientation:  Negative  Thought Content:  Delusions and Paranoid Ideation  Suicidal Thoughts:  No  Homicidal Thoughts:  No  Memory:  Immediate;   Poor Recent;   Poor Remote;   Poor  Judgement:  Impaired  Insight:  Lacking  Psychomotor Activity:  Increased  Concentration:  Concentration: Fair and Attention Span: Fair  Recall:  [employer] of Knowledge:  Poor  Language:  Poor  Akathisia:  Negative  Handed:  Right  AIMS (if indicated):     Assets:  Desire for Improvement Resilience  ADL's:  Impaired  Cognition:  Impaired,  Mild  Sleep:  Number of Hours: 4.25     Treatment Plan Summary: Daily contact with patient to assess and evaluate symptoms and progress in treatment, Medication management and Plan : Patient is seen and examined.  Patient is a 48 year old female with the above-stated past psychiatric history who is seen in follow-up.   Diagnosis: #1 schizophrenia, #2 diabetes mellitus poorly controlled.  Patient is seen and examined.  She is still not doing very well.  She  has recently been switched to Haldol, and she still selectively mute.  Her blood sugar remains high.  I am hesitant to increase her Lantus significantly due to her poor p.o. intake.  She is on 18 units, and I will increase that to 22.  We will continue the sliding scale.  She is not sleeping, and I  am going to increase her haloperidol to 7.5 mg p.o. 3 times daily.  I am going to order some laboratories because of concern for dehydration as well as some of the issues that got her admitted to the medical side of the hospital.  No other changes in her medications this morning. 1.  Increase haloperidol to 7.5 mg p.o. 3 times daily for psychosis. 2.  Increase Lantus insulin to 22 units subcu nightly for diabetes mellitus. 3.  Continue sliding scale insulin. 4.  Continue hydroxyzine 25 mg p.o. 3 times daily as needed anxiety. 5.  Continue Cytomel 12.5 mcg p.o. daily for augmentation treatment. 6.  Continue Xarelto 20 mg daily for anticoagulation. 7.  Recheck CBC with differential, urinalysis and complete metabolic panel for metabolic abnormalities that may be contributing to her psychiatric condition. 8.  Disposition planning-in progress.  Sharma Covert, MD 08/10/2018, 11:57 AM

## 2018-08-10 NOTE — Plan of Care (Signed)
  Problem: Safety: Goal: Periods of time without injury will increase Outcome: Progressing   

## 2018-08-10 NOTE — Progress Notes (Signed)
Patient ID: Debbie Dorsey, female   DOB: September 19, 1970, 48 y.o.   MRN: [id number]   D: Pt sitting in her room on her bed. Pt ate 80% of her dinner. Pt still needing constant redirection. No signs of distress or discomfort noted. Sitter with pt. A: 1:1 observation continues for safety. R: Pt remains safe on the unit.

## 2018-08-10 NOTE — Progress Notes (Signed)
CO Progress Note D: Pt currently wandering the hall. Patient attempting to leave the unit multiple times. Pt redirected constantly by staff.  A: Sitter is currently walking with pt. R: Pt remains safe on a CO per MD orders.

## 2018-08-11 LAB — CBC WITH DIFFERENTIAL/PLATELET
Abs Immature Granulocytes: 0.01 10*3/uL (ref 0.00–0.07)
Basophils Absolute: 0.1 10*3/uL (ref 0.0–0.1)
Basophils Relative: 1 %
Eosinophils Absolute: 0.1 10*3/uL (ref 0.0–0.5)
Eosinophils Relative: 2 %
HCT: 40.8 % (ref 36.0–46.0)
Hemoglobin: 12.8 g/dL (ref 12.0–15.0)
Immature Granulocytes: 0 %
Lymphocytes Relative: 40 %
Lymphs Abs: 2.5 10*3/uL (ref 0.7–4.0)
MCH: 31.5 pg (ref 26.0–34.0)
MCHC: 31.4 g/dL (ref 30.0–36.0)
MCV: 100.5 fL — ABNORMAL HIGH (ref 80.0–100.0)
Monocytes Absolute: 0.5 10*3/uL (ref 0.1–1.0)
Monocytes Relative: 7 %
Neutro Abs: 3.2 10*3/uL (ref 1.7–7.7)
Neutrophils Relative %: 50 %
Platelets: 382 10*3/uL (ref 150–400)
RBC: 4.06 MIL/uL (ref 3.87–5.11)
RDW: 13.8 % (ref 11.5–15.5)
WBC: 6.3 10*3/uL (ref 4.0–10.5)
nRBC: 0 % (ref 0.0–0.2)

## 2018-08-11 LAB — COMPREHENSIVE METABOLIC PANEL
ALT: 23 U/L (ref 0–44)
AST: 27 U/L (ref 15–41)
Albumin: 4.1 g/dL (ref 3.5–5.0)
Alkaline Phosphatase: 49 U/L (ref 38–126)
Anion gap: 8 (ref 5–15)
BUN: 12 mg/dL (ref 6–20)
CO2: 28 mmol/L (ref 22–32)
Calcium: 9.6 mg/dL (ref 8.9–10.3)
Chloride: 104 mmol/L (ref 98–111)
Creatinine, Ser: 0.63 mg/dL (ref 0.44–1.00)
GFR calc Af Amer: >60 mL/min (ref >60)
GFR calc non Af Amer: >60 mL/min (ref >60)
Glucose, Bld: 127 mg/dL — ABNORMAL HIGH (ref 70–99)
Potassium: 4.2 mmol/L (ref 3.5–5.1)
Sodium: 140 mmol/L (ref 135–145)
Total Bilirubin: 0.5 mg/dL (ref 0.3–1.2)
Total Protein: 7.4 g/dL (ref 6.5–8.1)

## 2018-08-11 LAB — GLUCOSE, CAPILLARY
Glucose-Capillary: 109 mg/dL — ABNORMAL HIGH (ref 70–99)
Glucose-Capillary: 195 mg/dL — ABNORMAL HIGH (ref 70–99)
Glucose-Capillary: 54 mg/dL — ABNORMAL LOW (ref 70–99)
Glucose-Capillary: 298 mg/dL — ABNORMAL HIGH (ref 70–99)
Glucose-Capillary: 53 mg/dL — ABNORMAL LOW (ref 70–99)
Glucose-Capillary: 103 mg/dL — ABNORMAL HIGH (ref 70–99)

## 2018-08-11 MED ORDER — ZIPRASIDONE MESYLATE 20 MG IM SOLR
INTRAMUSCULAR | Status: AC
  Filled 2018-08-11: qty 20

## 2018-08-11 MED ORDER — CLOZAPINE 25 MG PO TABS
50.0000 mg | ORAL_TABLET | Freq: Two times a day (BID) | ORAL | Status: DC
  Administered 2018-08-11 – 2018-08-12 (×3): 50 mg via ORAL
  Filled 2018-08-11 (×5): qty 2

## 2018-08-11 MED ORDER — GABAPENTIN 100 MG PO CAPS
200.0000 mg | ORAL_CAPSULE | Freq: Three times a day (TID) | ORAL | Status: DC
  Administered 2018-08-11 – 2018-08-27 (×49): 200 mg via ORAL
  Filled 2018-08-11 (×54): qty 2

## 2018-08-11 NOTE — Progress Notes (Signed)
1:1 Note  Pt is a sleep at this time with even and unlabored respiration, remains on 1;1 while a wake for safety, will continue to monitor.

## 2018-08-11 NOTE — Progress Notes (Signed)
[facility] MD Progress Note  08/11/2018 11:55 AM Debbie Dorsey  MRN:  [id number] Subjective:  Patient is a 48 year old female with a past psychiatric history significant for schizophrenia who was transferred from the [facility] medical floor on 08/01/2018.  She had been admitted to the medical floor secondary to diabetic ketoacidosis and then was transferred to our facility.  Objective: Patient is seen and examined.  Patient is a 48 year old female with the above-stated past psychiatric history is seen in follow-up.  She is essentially unchanged from yesterday.  I had increased her haloperidol to 7.5 mg p.o. 3 times daily.  She still remains physically agitated, she speaks in essentially mute tone.  She is restless and continues to move around.  I was concerned that there might be some metabolic problems given her history of DKA as well as lack of p.o. intake.  Surprisingly her creatinine had improved to 0.63.  The rest of her electrolytes and liver function enzymes appeared normal.  Her repeat CBC also was essentially normal except for a mildly elevated MCV.  Her urinalysis has not been collected yet.  Her blood sugar this morning is 195.  Her vital signs have been stable.  Principal Problem: <principal problem not specified> Diagnosis: Active Problems:   Schizophrenia ([place])  Total Time spent with patient: 15 minutes  Past Psychiatric History: See admission H&P  Past Medical History:  Past Medical History:  Diagnosis Date  . Diabetes mellitus without complication ([place])    History reviewed. No pertinent surgical history. Family History:  Family History  Problem Relation Age of Onset  . Mental illness Sister    Family Psychiatric  History: See admission H&P Social History:  Social History   Substance and Sexual Activity  Alcohol Use No     Social History   Substance and Sexual Activity  Drug Use No    Social History   Socioeconomic History  . Marital status: Single     Spouse name: Not on file  . Number of children: Not on file  . Years of education: Not on file  . Highest education level: Not on file  Occupational History  . Not on file  Social Needs  . Financial resource strain: Not on file  . Food insecurity    Worry: Not on file    Inability: Not on file  . Transportation needs    Medical: Not on file    Non-medical: Not on file  Tobacco Use  . Smoking status: Never Smoker  . Smokeless tobacco: Never Used  Substance and Sexual Activity  . Alcohol use: No  . Drug use: No  . Sexual activity: Not Currently  Lifestyle  . Physical activity    Days per week: Not on file    Minutes per session: Not on file  . Stress: Not on file  Relationships  . Social Herbalist on phone: Not on file    Gets together: Not on file    Attends religious service: Not on file    Active member of club or organization: Not on file    Attends meetings of clubs or organizations: Not on file    Relationship status: Not on file  Other Topics Concern  . Not on file  Social History Narrative  . Not on file   Additional Social History:                         Sleep:  Poor  Appetite:  Poor  Current Medications: Current Facility-Administered Medications  Medication Dose Route Frequency Provider Last Rate Last Dose  . acetaminophen (TYLENOL) tablet 650 mg  650 mg Oral Q6H PRN Sharma Covert, MD      . alum & mag hydroxide-simeth (MAALOX/MYLANTA) 200-200-20 MG/5ML suspension 30 mL  30 mL Oral Q4H PRN Sharma Covert, MD      . cloZAPine (CLOZARIL) tablet 50 mg  50 mg Oral BID Sharma Covert, MD   50 mg at 08/11/18 1124  . gabapentin (NEURONTIN) capsule 200 mg  200 mg Oral TID Sharma Covert, MD   200 mg at 08/11/18 1124  . hydrOXYzine (ATARAX/VISTARIL) tablet 25 mg  25 mg Oral TID PRN Sharma Covert, MD   25 mg at 08/10/18 2117  . insulin aspart (novoLOG) injection 0-20 Units  0-20 Units Subcutaneous TID WC Patriciaann Clan  E, PA-C   3 Units at 08/10/18 1740  . insulin aspart (novoLOG) injection 0-5 Units  0-5 Units Subcutaneous QHS Laverle Hobby, PA-C   2 Units at 08/10/18 2118  . insulin aspart (novoLOG) injection 6 Units  6 Units Subcutaneous TID WC Patriciaann Clan E, PA-C   6 Units at 08/11/18 [phone]  . insulin glargine (LANTUS) injection 22 Units  22 Units Subcutaneous Daily Sharma Covert, MD   22 Units at 08/11/18 0900  . magnesium hydroxide (MILK OF MAGNESIA) suspension 30 mL  30 mL Oral Daily PRN Sharma Covert, MD      . prenatal multivitamin tablet 1 tablet  1 tablet Oral Q1200 Johnn Hai, MD   1 tablet at 08/11/18 1124  . rivaroxaban (XARELTO) tablet 20 mg  20 mg Oral Daily Johnn Hai, MD   20 mg at 08/10/18 1742  . simvastatin (ZOCOR) tablet 40 mg  40 mg Oral q1800 Sharma Covert, MD   40 mg at 08/10/18 1742  . temazepam (RESTORIL) capsule 15 mg  15 mg Oral QHS Connye Burkitt, NP   15 mg at 08/10/18 2117  . traZODone (DESYREL) tablet 50 mg  50 mg Oral QHS PRN Sharma Covert, MD   50 mg at 08/10/18 2117  . ziprasidone (GEODON) injection 10 mg  10 mg Intramuscular Q6H PRN Sharma Covert, MD   10 mg at 08/11/18 1116    Lab Results:  Results for orders placed or performed during the hospital encounter of 07/31/18 (from the past 48 hour(s))  Glucose, capillary     Status: Abnormal   Collection Time: 08/09/18 12:14 PM  Result Value Ref Range   Glucose-Capillary 354 (H) 70 - 99 mg/dL  Glucose, capillary     Status: Abnormal   Collection Time: 08/09/18  5:00 PM  Result Value Ref Range   Glucose-Capillary 172 (H) 70 - 99 mg/dL   Comment 1 Notify RN    Comment 2 Document in Chart   Glucose, capillary     Status: Abnormal   Collection Time: 08/09/18  7:41 PM  Result Value Ref Range   Glucose-Capillary 162 (H) 70 - 99 mg/dL  Glucose, capillary     Status: Abnormal   Collection Time: 08/10/18  5:57 AM  Result Value Ref Range   Glucose-Capillary 188 (H) 70 - 99 mg/dL   Comment 1  Notify RN   Glucose, capillary     Status: Abnormal   Collection Time: 08/10/18  8:00 AM  Result Value Ref Range   Glucose-Capillary 298 (H) 70 - 99 mg/dL  Glucose, capillary     Status: Abnormal   Collection Time: 08/10/18 12:07 PM  Result Value Ref Range   Glucose-Capillary 149 (H) 70 - 99 mg/dL  Glucose, capillary     Status: Abnormal   Collection Time: 08/10/18  5:35 PM  Result Value Ref Range   Glucose-Capillary 147 (H) 70 - 99 mg/dL  Glucose, capillary     Status: Abnormal   Collection Time: 08/10/18  9:12 PM  Result Value Ref Range   Glucose-Capillary 215 (H) 70 - 99 mg/dL  Glucose, capillary     Status: Abnormal   Collection Time: 08/11/18  5:46 AM  Result Value Ref Range   Glucose-Capillary 109 (H) 70 - 99 mg/dL  Comprehensive metabolic panel     Status: Abnormal   Collection Time: 08/11/18  6:30 AM  Result Value Ref Range   Sodium 140 135 - 145 mmol/L   Potassium 4.2 3.5 - 5.1 mmol/L   Chloride 104 98 - 111 mmol/L   CO2 28 22 - 32 mmol/L   Glucose, Bld 127 (H) 70 - 99 mg/dL   BUN 12 6 - 20 mg/dL   Creatinine, Ser 0.63 0.44 - 1.00 mg/dL   Calcium 9.6 8.9 - 10.3 mg/dL   Total Protein 7.4 6.5 - 8.1 g/dL   Albumin 4.1 3.5 - 5.0 g/dL   AST 27 15 - 41 U/L   ALT 23 0 - 44 U/L   Alkaline Phosphatase 49 38 - 126 U/L   Total Bilirubin 0.5 0.3 - 1.2 mg/dL   GFR calc non Af Amer >60 >60 mL/min   GFR calc Af Amer >60 >60 mL/min   Anion gap 8 5 - 15    Comment: Performed at [facility], [place] [address] [place], [place] [address]  CBC with Differential/Platelet     Status: Abnormal   Collection Time: 08/11/18  6:30 AM  Result Value Ref Range   WBC 6.3 4.0 - 10.5 K/uL   RBC 4.06 3.87 - 5.11 MIL/uL   Hemoglobin 12.8 12.0 - 15.0 g/dL   HCT 40.8 36.0 - 46.0 %   MCV 100.5 (H) 80.0 - 100.0 fL   MCH 31.5 26.0 - 34.0 pg   MCHC 31.4 30.0 - 36.0 g/dL   RDW 13.8 11.5 - 15.5 %   Platelets 382 150 - 400 K/uL   nRBC 0.0 0.0 - 0.2 %   Neutrophils Relative % 50 %    Neutro Abs 3.2 1.7 - 7.7 K/uL   Lymphocytes Relative 40 %   Lymphs Abs 2.5 0.7 - 4.0 K/uL   Monocytes Relative 7 %   Monocytes Absolute 0.5 0.1 - 1.0 K/uL   Eosinophils Relative 2 %   Eosinophils Absolute 0.1 0.0 - 0.5 K/uL   Basophils Relative 1 %   Basophils Absolute 0.1 0.0 - 0.1 K/uL   Immature Granulocytes 0 %   Abs Immature Granulocytes 0.01 0.00 - 0.07 K/uL    Comment: Performed at [facility], [place] [address] [place], [place] [address]  Glucose, capillary     Status: Abnormal   Collection Time: 08/11/18 11:35 AM  Result Value Ref Range   Glucose-Capillary 54 (L) 70 - 99 mg/dL  Glucose, capillary     Status: Abnormal   Collection Time: 08/11/18 11:36 AM  Result Value Ref Range   Glucose-Capillary 195 (H) 70 - 99 mg/dL    Blood Alcohol level:  Lab Results  Component Value Date   ETH <10 07/28/2018  ETH <10 08/67/6195    Metabolic Disorder Labs: Lab Results  Component Value Date   HGBA1C 8.5 (H) 07/29/2018   MPG 197 07/29/2018   MPG 191.51 05/26/2018   No results found for: PROLACTIN No results found for: CHOL, TRIG, HDL, CHOLHDL, VLDL, LDLCALC  Physical Findings: AIMS: Facial and Oral Movements Muscles of Facial Expression: None, normal Lips and Perioral Area: None, normal Jaw: None, normal Tongue: None, normal,Extremity Movements Upper (arms, wrists, hands, fingers): None, normal Lower (legs, knees, ankles, toes): None, normal, Trunk Movements Neck, shoulders, hips: None, normal, Overall Severity Severity of abnormal movements (highest score from questions above): None, normal Incapacitation due to abnormal movements: None, normal Patient's awareness of abnormal movements (rate only patient's report): No Awareness, Dental Status Current problems with teeth and/or dentures?: No Does patient usually wear dentures?: No  CIWA:    COWS:     Musculoskeletal: Strength & Muscle Tone: within normal limits Gait & Station: shuffle Patient  leans: N/A  Psychiatric Specialty Exam: Physical Exam  Nursing note and vitals reviewed. Constitutional: She appears well-developed.  HENT:  Head: Normocephalic and atraumatic.  Respiratory: Effort normal.  Neurological: She is alert.    ROS  Blood pressure 92/67, pulse (!) 108, temperature 98.6 F (37 C), temperature source Oral, resp. rate 16, height 4\' 11"  (1.499 m), weight 41.3 kg, SpO2 100 %, unknown if currently breastfeeding.Body mass index is 18.38 kg/m.  General Appearance: Disheveled  Eye Contact:  Minimal  Speech:  Slow  Volume:  : Essentially mute  Mood:  Dysphoric  Affect:  Flat  Thought Process:  Disorganized and Descriptions of Associations: Loose  Orientation:  Negative  Thought Content:  Delusions  Suicidal Thoughts:  No  Homicidal Thoughts:  No  Memory:  Immediate;   Poor Recent;   Poor Remote;   Poor  Judgement:  Impaired  Insight:  Lacking  Psychomotor Activity:  Increased and Restlessness  Concentration:  Concentration: Poor and Attention Span: Poor  Recall:  Poor  Fund of Knowledge:  Poor  Language:  Poor  Akathisia:  Negative  Handed:  Right  AIMS (if indicated):     Assets:  Desire for Improvement Resilience  ADL's:  Impaired  Cognition:  Impaired,  Moderate  Sleep:  Number of Hours: 5     Treatment Plan Summary: Daily contact with patient to assess and evaluate symptoms and progress in treatment, Medication management and Plan : Patient is seen and examined.  Patient is a 48 year old female with the above-stated past psychiatric history who is seen in follow-up.   Diagnosis: #1 schizophrenia, #2 diabetes mellitus type 2, #3 chronic anticoagulation, #4 hyperlipidemia  Patient is seen in follow-up.  She is essentially unchanged.  She is just not doing very well.  She has been on 3 different antipsychotic so far without great effect.  This includes haloperidol, Trilafon and Risperdal.  Given those failures I think it is indicated to go to  Clozaril.  She is taking some orals, and hopefully she will take this.  Her CBC with differential is normal.  We will start 50 mg p.o. twice daily and titrate that during the course the hospitalization.  There is a chance that it could potentially make her diabetes worse, but her blood sugars will be monitored.  No change in her treatment with regard to her diabetes at this point. 1.  Stop Haldol. 2.  Clozaril 50 mg p.o. twice daily for psychosis. 3.  Increase gabapentin to 200 mg p.o. 3 times daily  for anxiety and restlessness. 4.  Continue hydroxyzine 25 mg p.o. 3 times daily as needed anxiety. 5.  Continue sliding scale insulin.  This is for diabetes. 6.  Increase Lantus insulin to 25 units subcu daily.  This is for diabetes. 7.  Continue Xarelto 20 mg p.o. daily for anticoagulation. 8.  Continue simvastatin 40 mg p.o. daily for hyperlipidemia. 9.  Continue temazepam 15 mg p.o. nightly for insomnia. 10.  Continue trazodone 50 mg p.o. nightly as needed insomnia. 11.  Collect urinalysis. 12.  Disposition planning-in progress.  Sharma Covert, MD 08/11/2018, 11:55 AM

## 2018-08-11 NOTE — Progress Notes (Signed)
Close Observation Note 0900  Patient has been in her room with MHT close observation.  Respirations even and unlabored.  No signs/symptoms of pain/distress noted on patient's face/body movements. Close Observation per MD order.

## 2018-08-11 NOTE — Progress Notes (Signed)
cbg 103   Pt ate a snack and has not been symptomatic since the episode has begun

## 2018-08-11 NOTE — Progress Notes (Signed)
Close Observation Note 1700  Patient slept after lunch in her bed.  Close observation continues per MD order for patient's safety.  Respirations even and unlabored.

## 2018-08-11 NOTE — Plan of Care (Signed)
Nurse discussed anxiety, depression and coping skills with patient.  

## 2018-08-11 NOTE — Progress Notes (Signed)
Close Observation  1330   Patient has been in her room resting in bed with eyes closed.  Respirations even and unlabored.  No signs/symptoms of pain/distress noted on patient's face/body movements.  Close observation continues for patient's safety per MD order.

## 2018-08-11 NOTE — Progress Notes (Signed)
1;1 Note Pt is a wake in the room at this time, pt is calm and lying on bed awake. Remains on 1;1 for safety, staff in the room with the pt, will continue to monitor.

## 2018-08-11 NOTE — Progress Notes (Signed)
1:1 Note  Pt in the room at this time, pt is very confused and requires constant redirection. Pt took her evening medications with a lot of prompting. Pt is non verbal and not responding when spoken to. Pt remains on 1:1 while awake for safety, pt remains safe in the unit will continue to monitor.

## 2018-08-11 NOTE — Progress Notes (Addendum)
Close Observation 1000   After several attempts by staff, patient took her morning medications.  Patient has been walking in hallway with MHT.  MD came to patient's room this morning.  Patient has stood at double doors in hallway, trying to get out of 500 hall unit.  Patient has been redirected several times by staff.  UA container give to patient/MHT.  Respirations even and unlabored.  Close observation continues.

## 2018-08-11 NOTE — [facility] Group Notes (Signed)
[facility] LCSW Group Therapy Note  Date/Time:  08/11/2018  11:00AM-12:00PM  Type of Therapy and Topic:  Group Therapy:  Music and Mood  Participation Level:  Did Not Attend  - was in and out of room, see below  Description of Group: In this process group, members listened to a variety of genres of music and identified that different types of music evoke different responses.  Patients were encouraged to identify music that was soothing for them and music that was energizing for them.  Patients discussed how this knowledge can help with wellness and recovery in various ways including managing depression and anxiety as well as encouraging healthy sleep habits.    Therapeutic Goals: 1. Patients will explore the impact of different varieties of music on mood 2. Patients will verbalize the thoughts they have when listening to different types of music 3. Patients will identify music that is soothing to them as well as music that is energizing to them 4. Patients will discuss how to use this knowledge to assist in maintaining wellness and recovery 5. Patients will explore the use of music as a coping skill  Summary of Patient Progress:  Other patients kept leaving the room to make attempts to get patient to come into the room and stay.  She would come in, but would leave immediately.  She was observed in the hall dancing/swaying to the rhythm of the music.  Therapeutic Modalities: Solution Focused Brief Therapy Activity   Selmer Dominion, LCSW

## 2018-08-11 NOTE — Progress Notes (Signed)
Pt cbg 53 received yogurt and pudding and cheese for snack as she did not want what was on the protocal   Pt is asymtomatic but she is psychotic and continues to need redirection and she has a sitter by her bed for safety   Pt is presently eating her snack

## 2018-08-11 NOTE — Progress Notes (Signed)
Close Observation Note 1800  Patient took her 1700 medications at the med window.  Patient ate 100% of her dinner.  Presently patient laying in bed with eyes open.  Respirations even and unlabored.  Close Observation continues for patient's safety per MD order.

## 2018-08-11 NOTE — Progress Notes (Signed)
C/o note   Pt continues to be confused and pushing on doors   She needs frequent redirection to stay on task    Pt is on close obs for safety   Pt remains safe at present  [place] CORONAVIRUS (COVID-19) DAILY CHECK-OFF SYMPTOMS - answer yes or no to each - every day NO YES  Have you had a fever in the past 24 hours?  . Fever (Temp > 37.80C / 100F) X   Have you had any of these symptoms in the past 24 hours? . New Cough .  Sore Throat  .  Shortness of Breath .  Difficulty Breathing .  Unexplained Body Aches   X   Have you had any one of these symptoms in the past 24 hours not related to allergies?   . Runny Nose .  Nasal Congestion .  Sneezing   X   If you have had runny nose, nasal congestion, sneezing in the past 24 hours, has it worsened?  X   EXPOSURES - check yes or no X   Have you traveled outside the state in the past 14 days?  X   Have you been in contact with someone with a confirmed diagnosis of COVID-19 or PUI in the past 14 days without wearing appropriate PPE?  X   Have you been living in the same home as a person with confirmed diagnosis of COVID-19 or a PUI (household contact)?    X   Have you been diagnosed with COVID-19?    X              What to do next: Answered NO to all: Answered YES to anything:   Proceed with unit schedule Follow the [facility] Inpatient Flowsheet.

## 2018-08-12 LAB — GLUCOSE, CAPILLARY
Glucose-Capillary: 207 mg/dL — ABNORMAL HIGH (ref 70–99)
Glucose-Capillary: 308 mg/dL — ABNORMAL HIGH (ref 70–99)
Glucose-Capillary: 138 mg/dL — ABNORMAL HIGH (ref 70–99)
Glucose-Capillary: 142 mg/dL — ABNORMAL HIGH (ref 70–99)

## 2018-08-12 MED ORDER — CLOZAPINE 25 MG PO TABS
50.0000 mg | ORAL_TABLET | Freq: Every day | ORAL | Status: DC
  Filled 2018-08-12 (×2): qty 2

## 2018-08-12 MED ORDER — CLOZAPINE 25 MG PO TABS
75.0000 mg | ORAL_TABLET | Freq: Every day | ORAL | Status: DC
  Administered 2018-08-12 – 2018-08-17 (×6): 75 mg via ORAL
  Filled 2018-08-12 (×8): qty 3

## 2018-08-12 MED ORDER — TEMAZEPAM 7.5 MG PO CAPS
22.5000 mg | ORAL_CAPSULE | Freq: Every day | ORAL | Status: DC
  Administered 2018-08-12: 22.5 mg via ORAL
  Filled 2018-08-12: qty 3

## 2018-08-12 NOTE — Progress Notes (Signed)
Closed Observation Note: Patient maintained on closed observation for safety.  Denies suicidal thoughts, auditory and visual hallucinations.  Routine safety checks maintained every 15 minutes.  Medication given as prescribed.  Offered support and encouragement as needed.  Patient is safe on the unit with supervision.

## 2018-08-12 NOTE — Progress Notes (Signed)
Pt has been up tonight   Needs frequent to constant  redirection   Needs frequent prompting to complete tasks   She is confused   Pt has had all medications for sleep   Pt received IM Geodon on the previous shift and slept throughout most of the rest of the day and this is probably why she is not sleeping tonight   She frequently puts her hands on staff and walks very close   Pt is on c/o for mentioned behaviors and is presently safe

## 2018-08-12 NOTE — Progress Notes (Signed)
[facility] MD Progress Note  08/12/2018 9:34 AM Debbie Dorsey  MRN:  [id number] Subjective: Patient continues to speak in soft sentence fragments she is a little more organized with the clozapine according to staff did not sleep however mood is still subdued no thoughts of harming self expressed but not fully understanding all questions. 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 Diabetes mellitus without complication ([place])    History reviewed. No pertinent surgical history. Family History:  Family History  Problem Relation Age of Onset  . Mental illness Sister    Family Psychiatric  History: see eval Social History:  Social History   Substance and Sexual Activity  Alcohol Use No     Social History   Substance and Sexual Activity  Drug Use No    Social History   Socioeconomic History  . Marital status: Single    Spouse name: Not on file  . Number of children: Not on file  . Years of education: Not on file  . Highest education level: Not on file  Occupational History  . Not on file  Social Needs  . Financial resource strain: Not on file  . Food insecurity    Worry: Not on file    Inability: Not on file  . Transportation needs    Medical: Not on file    Non-medical: Not on file  Tobacco Use  . Smoking status: Never Smoker  . Smokeless tobacco: Never Used  Substance and Sexual Activity  . Alcohol use: No  . Drug use: No  . Sexual activity: Not Currently  Lifestyle  . Physical activity    Days per week: Not on file    Minutes per session: Not on file  . Stress: Not on file  Relationships  . Social Herbalist on phone: Not on file    Gets together: Not on file    Attends religious service: Not on file    Active member of club or organization: Not on  file    Attends meetings of clubs or organizations: Not on file    Relationship status: Not on file  Other Topics Concern  . Not on file  Social History Narrative  . Not on file   Additional Social History:                         Sleep: Good  Appetite:  Good  Current Medications: Current Facility-Administered Medications  Medication Dose Route Frequency Provider Last Rate Last Dose  . acetaminophen (TYLENOL) tablet 650 mg  650 mg Oral Q6H PRN Sharma Covert, MD      . alum & mag hydroxide-simeth (MAALOX/MYLANTA) 200-200-20 MG/5ML suspension 30 mL  30 mL Oral Q4H PRN Sharma Covert, MD      . Derrill Memo ON 08/13/2018] cloZAPine (CLOZARIL) tablet 50 mg  50 mg Oral Daily Johnn Hai, MD      . cloZAPine (CLOZARIL) tablet 75 mg  75 mg Oral QHS Johnn Hai, MD      . gabapentin (NEURONTIN) capsule 200 mg  200 mg Oral TID Sharma Covert, MD   200 mg at 08/12/18 0912  . hydrOXYzine (ATARAX/VISTARIL) tablet 25 mg  25 mg Oral TID PRN Sharma Covert, MD   25 mg at 08/12/18 0206  .  insulin aspart (novoLOG) injection 0-20 Units  0-20 Units Subcutaneous TID WC Laverle Hobby, PA-C   11 Units at 08/11/18 1718  . insulin aspart (novoLOG) injection 0-5 Units  0-5 Units Subcutaneous QHS Laverle Hobby, PA-C   2 Units at 08/10/18 2118  . insulin aspart (novoLOG) injection 6 Units  6 Units Subcutaneous TID WC Patriciaann Clan E, PA-C   6 Units at 08/11/18 1718  . insulin glargine (LANTUS) injection 22 Units  22 Units Subcutaneous Daily Sharma Covert, MD   22 Units at 08/12/18 0911  . magnesium hydroxide (MILK OF MAGNESIA) suspension 30 mL  30 mL Oral Daily PRN Sharma Covert, MD      . prenatal multivitamin tablet 1 tablet  1 tablet Oral Q1200 Johnn Hai, MD   1 tablet at 08/11/18 1124  . rivaroxaban (XARELTO) tablet 20 mg  20 mg Oral Daily Johnn Hai, MD   20 mg at 08/11/18 1714  . simvastatin (ZOCOR) tablet 40 mg  40 mg Oral q1800 Sharma Covert, MD   40 mg at  08/11/18 1715  . temazepam (RESTORIL) capsule 15 mg  15 mg Oral QHS Connye Burkitt, NP   15 mg at 08/11/18 2105  . traZODone (DESYREL) tablet 50 mg  50 mg Oral QHS PRN Sharma Covert, MD   50 mg at 08/11/18 2105  . ziprasidone (GEODON) injection 10 mg  10 mg Intramuscular Q6H PRN Sharma Covert, MD   10 mg at 08/12/18 1610    Lab Results:  Results for orders placed or performed during the hospital encounter of 07/31/18 (from the past 48 hour(s))  Glucose, capillary     Status: Abnormal   Collection Time: 08/10/18 12:07 PM  Result Value Ref Range   Glucose-Capillary 149 (H) 70 - 99 mg/dL  Glucose, capillary     Status: Abnormal   Collection Time: 08/10/18  5:35 PM  Result Value Ref Range   Glucose-Capillary 147 (H) 70 - 99 mg/dL  Glucose, capillary     Status: Abnormal   Collection Time: 08/10/18  9:12 PM  Result Value Ref Range   Glucose-Capillary 215 (H) 70 - 99 mg/dL  Glucose, capillary     Status: Abnormal   Collection Time: 08/11/18  5:46 AM  Result Value Ref Range   Glucose-Capillary 109 (H) 70 - 99 mg/dL  Comprehensive metabolic panel     Status: Abnormal   Collection Time: 08/11/18  6:30 AM  Result Value Ref Range   Sodium 140 135 - 145 mmol/L   Potassium 4.2 3.5 - 5.1 mmol/L   Chloride 104 98 - 111 mmol/L   CO2 28 22 - 32 mmol/L   Glucose, Bld 127 (H) 70 - 99 mg/dL   BUN 12 6 - 20 mg/dL   Creatinine, Ser 0.63 0.44 - 1.00 mg/dL   Calcium 9.6 8.9 - 10.3 mg/dL   Total Protein 7.4 6.5 - 8.1 g/dL   Albumin 4.1 3.5 - 5.0 g/dL   AST 27 15 - 41 U/L   ALT 23 0 - 44 U/L   Alkaline Phosphatase 49 38 - 126 U/L   Total Bilirubin 0.5 0.3 - 1.2 mg/dL   GFR calc non Af Amer >60 >60 mL/min   GFR calc Af Amer >60 >60 mL/min   Anion gap 8 5 - 15    Comment: Performed at [facility], [place] [address] [place], [place] [address]  CBC with Differential/Platelet     Status: Abnormal  Collection Time: 08/11/18  6:30 AM  Result Value Ref Range   WBC 6.3 4.0 -  10.5 K/uL   RBC 4.06 3.87 - 5.11 MIL/uL   Hemoglobin 12.8 12.0 - 15.0 g/dL   HCT 40.8 36.0 - 46.0 %   MCV 100.5 (H) 80.0 - 100.0 fL   MCH 31.5 26.0 - 34.0 pg   MCHC 31.4 30.0 - 36.0 g/dL   RDW 13.8 11.5 - 15.5 %   Platelets 382 150 - 400 K/uL   nRBC 0.0 0.0 - 0.2 %   Neutrophils Relative % 50 %   Neutro Abs 3.2 1.7 - 7.7 K/uL   Lymphocytes Relative 40 %   Lymphs Abs 2.5 0.7 - 4.0 K/uL   Monocytes Relative 7 %   Monocytes Absolute 0.5 0.1 - 1.0 K/uL   Eosinophils Relative 2 %   Eosinophils Absolute 0.1 0.0 - 0.5 K/uL   Basophils Relative 1 %   Basophils Absolute 0.1 0.0 - 0.1 K/uL   Immature Granulocytes 0 %   Abs Immature Granulocytes 0.01 0.00 - 0.07 K/uL    Comment: Performed at [facility], [place] [address], [place], [place] [address]  Glucose, capillary     Status: Abnormal   Collection Time: 08/11/18 11:35 AM  Result Value Ref Range   Glucose-Capillary 54 (L) 70 - 99 mg/dL  Glucose, capillary     Status: Abnormal   Collection Time: 08/11/18 11:36 AM  Result Value Ref Range   Glucose-Capillary 195 (H) 70 - 99 mg/dL  Glucose, capillary     Status: Abnormal   Collection Time: 08/11/18  5:10 PM  Result Value Ref Range   Glucose-Capillary 298 (H) 70 - 99 mg/dL  Glucose, capillary     Status: Abnormal   Collection Time: 08/11/18  8:05 PM  Result Value Ref Range   Glucose-Capillary 53 (L) 70 - 99 mg/dL  Glucose, capillary     Status: Abnormal   Collection Time: 08/11/18  8:55 PM  Result Value Ref Range   Glucose-Capillary 103 (H) 70 - 99 mg/dL  Glucose, capillary     Status: Abnormal   Collection Time: 08/12/18  5:52 AM  Result Value Ref Range   Glucose-Capillary 207 (H) 70 - 99 mg/dL    Blood Alcohol level:  Lab Results  Component Value Date   ETH <10 07/28/2018   ETH <10 81/82/9937    Metabolic Disorder Labs: Lab Results  Component Value Date   HGBA1C 8.5 (H) 07/29/2018   MPG 197 07/29/2018   MPG 191.51 05/26/2018   No results found  for: PROLACTIN No results found for: CHOL, TRIG, HDL, CHOLHDL, VLDL, LDLCALC  Physical Findings: AIMS: Facial and Oral Movements Muscles of Facial Expression: None, normal Lips and Perioral Area: None, normal Jaw: None, normal Tongue: None, normal,Extremity Movements Upper (arms, wrists, hands, fingers): None, normal Lower (legs, knees, ankles, toes): None, normal, Trunk Movements Neck, shoulders, hips: None, normal, Overall Severity Severity of abnormal movements (highest score from questions above): None, normal Incapacitation due to abnormal movements: None, normal Patient's awareness of abnormal movements (rate only patient's report): No Awareness, Dental Status Current problems with teeth and/or dentures?: No Does patient usually wear dentures?: No  CIWA:  CIWA-Ar Total: 1 COWS:     Musculoskeletal: Strength & Muscle Tone: within normal limits Gait & Station: normal Patient leans: N/A  Psychiatric Specialty Exam: Physical Exam  ROS  Blood pressure 96/74, pulse (!) 128, temperature (!) 97.5 F (36.4 C), temperature source Oral, resp.  rate 18, height 4\' 11"  (1.499 m), weight 41.3 kg, SpO2 100 %, unknown if currently breastfeeding.Body mass index is 18.38 kg/m.  General Appearance: Casual  Eye Contact:  Good  Speech: Garbled soft sentence fragments  Volume: Decreased  Mood:  Dysphoric  Affect:  Constricted  Thought Process:  Disorganized and Descriptions of Associations: Circumstantial  Orientation:  Other:  person/place  Thought Content:  Delusions  Suicidal Thoughts:  No  Homicidal Thoughts:  No  Memory:  Immediate;   Poor  Judgement: poor  Insight:  Lacking  Psychomotor Activity:  Decreased  Concentration:  Concentration: Poor  Recall:  Poor  Fund of Knowledge:  Poor  Language:  Poor  Akathisia:  Negative  Handed:  Right  AIMS (if indicated):     Assets:  Resilience Social Support  ADL's:  Intact  Cognition:  WNL  Sleep:  Number of Hours: 5      Treatment Plan Summary: Daily contact with patient to assess and evaluate symptoms and progress in treatment, Medication management and Plan Continue cognitive-based therapy continue one-to-one precautions escalate clozapine as tolerated add sleep aid  Deaven Urwin, MD 08/12/2018, 9:34 AM

## 2018-08-12 NOTE — Progress Notes (Signed)
[place] NOVEL CORONAVIRUS (COVID-19) DAILY CHECK-OFF SYMPTOMS - answer yes or no to each - every day NO YES  Have you had a fever in the past 24 hours?  . Fever (Temp > 37.80C / 100F) X   Have you had any of these symptoms in the past 24 hours? . New Cough .  Sore Throat  .  Shortness of Breath .  Difficulty Breathing .  Unexplained Body Aches   X   Have you had any one of these symptoms in the past 24 hours not related to allergies?   . Runny Nose .  Nasal Congestion .  Sneezing   X   If you have had runny nose, nasal congestion, sneezing in the past 24 hours, has it worsened?  X   EXPOSURES - check yes or no X   Have you traveled outside the state in the past 14 days?  X   Have you been in contact with someone with a confirmed diagnosis of COVID-19 or PUI in the past 14 days without wearing appropriate PPE?  X   Have you been living in the same home as a person with confirmed diagnosis of COVID-19 or a PUI (household contact)?    X   Have you been diagnosed with COVID-19?    X              What to do next: Answered NO to all: Answered YES to anything:   Proceed with unit schedule Follow the [facility] Inpatient Flowsheet.   

## 2018-08-12 NOTE — Progress Notes (Signed)
CO note:  Patient is observed awake in bed with MHT in arms reach. No distress noyed. Patient remains safe. CO maintained for safety.

## 2018-08-12 NOTE — Progress Notes (Signed)
Pt has been awake all night   She has been in constant motion and needs constant redirection   Held insulin this morning because pt had an episode of hypoglycemia last night and cannot be counted on to eat   She is confused and hyperactive and has poor boundaries   Pt on close obs and is presently safe

## 2018-08-12 NOTE — Progress Notes (Signed)
Closed Observation Note: Patient maintained on closed observation for safety.  Medication given as prescribed.  Routine safety checks maintained every 15 minutes.  Patient visible in milieu with minimal interaction.  Affect and mood pleasant.  Patient is safe on the unit.

## 2018-08-13 LAB — GLUCOSE, CAPILLARY
Glucose-Capillary: 254 mg/dL — ABNORMAL HIGH (ref 70–99)
Glucose-Capillary: 387 mg/dL — ABNORMAL HIGH (ref 70–99)
Glucose-Capillary: 148 mg/dL — ABNORMAL HIGH (ref 70–99)
Glucose-Capillary: 71 mg/dL (ref 70–99)

## 2018-08-13 MED ORDER — TEMAZEPAM 15 MG PO CAPS
15.0000 mg | ORAL_CAPSULE | Freq: Every day | ORAL | Status: DC
  Administered 2018-08-13 – 2018-08-25 (×13): 15 mg via ORAL
  Filled 2018-08-13 (×13): qty 1

## 2018-08-13 NOTE — Progress Notes (Signed)
CO note:   Patient is observed sleeping in bed. Respirations even and unlabored. No distress noted. Patient remains safe. CO will continue when awake for safety.

## 2018-08-13 NOTE — Care Management (Signed)
CMA attempted to contact [facility] regarding patient's ACTT referral. CMA left a voicemail for office. CMA will continue to follow up and notify CSW, [place].     Kenosha Doster Care Management Assistant  Email:[email or web address] Office: [phone]

## 2018-08-13 NOTE — Progress Notes (Signed)
COProgress Note D:Pt currently sleeping in the bed. Patient appropriate to situation. Pt in no current distress.  A: No sitter required due to CO while awake  R:Pt remains safe on the unit.

## 2018-08-13 NOTE — Progress Notes (Signed)
Inpatient Diabetes Program Recommendations  AACE/ADA: New Consensus Statement on Inpatient Glycemic Control (2015)  Target Ranges:  Prepandial:   less than 140 mg/dL      Peak postprandial:   less than 180 mg/dL (1-2 hours)      Critically ill patients:  140 - 180 mg/dL   Lab Results  Component Value Date   GLUCAP 387 (H) 08/13/2018   HGBA1C 8.5 (H) 07/29/2018    Review of Glycemic Control  Diabetes history: DM2 Outpatient Diabetes medications: NPH 70/30 32 units bid Current orders for Inpatient glycemic control: Lantus 22 units QD, Novolog 0-20 units tidwc and hs + 6 units tidwc  HgbA1C 8.5% - sub-par control Hypoglycemia likely from too much Novolog.  Inpatient Diabetes Program Recommendations:     Decrease Novolog to 0-15 units tidwc and hs Increase Lantus to 24 units QD  Will need to f/u with PCP after discharge for HgbA1C of 8.5%.  Thank you. Lorenda Peck, RD, LDN, CDE Inpatient Diabetes Coordinator [phone]

## 2018-08-13 NOTE — Plan of Care (Signed)
D: Patient is cooperative. Denies SI, HI, AVH, and verbally contracts for safety.    A: Medications administered per MD order. Support provided. Patient educated on safety on the unit and medications. Routine safety checks every 15 minutes. Patient stated understanding to tell nurse about any new physical symptoms. Patient understands to tell staff of any needs.     R: No adverse drug reactions noted. Patient verbally contracts for safety. Patient remains safe at this time and will continue to monitor.   Problem: Safety: Goal: Periods of time without injury will increase Outcome: Progressing   Patient remains safe and will continue to monitor.   [place] NOVEL CORONAVIRUS (COVID-19) DAILY CHECK-OFF SYMPTOMS - answer yes or no to each - every day NO YES  Have you had a fever in the past 24 hours?  Fever (Temp > 37.80C / 100F) X   Have you had any of these symptoms in the past 24 hours? New Cough  Sore Throat   Shortness of Breath  Difficulty Breathing  Unexplained Body Aches   X   Have you had any one of these symptoms in the past 24 hours not related to allergies?   Runny Nose  Nasal Congestion  Sneezing   X   If you have had runny nose, nasal congestion, sneezing in the past 24 hours, has it worsened?  X   EXPOSURES - check yes or no X   Have you traveled outside the state in the past 14 days?  X   Have you been in contact with someone with a confirmed diagnosis of COVID-19 or PUI in the past 14 days without wearing appropriate PPE?  X   Have you been living in the same home as a person with confirmed diagnosis of COVID-19 or a PUI (household contact)?    X   Have you been diagnosed with COVID-19?    X              What to do next: Answered NO to all: Answered YES to anything:   Proceed with unit schedule Follow the [facility] Inpatient Flowsheet.

## 2018-08-13 NOTE — Progress Notes (Signed)
Closed observation Note: Patient is on closed observation for safety.  Patient is in her room resting.  Appears sedated but respond to verbal and tactile stimuli. Routine safety checks maintained every 15 minutes.  Support and encouragement offered as needed.  Patient is safe on the unit with supervision.

## 2018-08-13 NOTE — Care Management (Signed)
CMA sent referral to [facility] for ACTT services. CMA will follow up with facility.      Erionna Strum Care Management Assistant  Email:[email or web address] Office: [phone]

## 2018-08-13 NOTE — Progress Notes (Signed)
Closed Observation Note: Patient presents with calm affect and mood.  Needed a little redirection on the unit.  Medication given as prescribed.  Routine safety checks maintained every 15 minutes.  Patient is safe on the unit with supervision.

## 2018-08-13 NOTE — Progress Notes (Signed)
Closed observation Note: Patient maintained on closed observation for safety.  Patient presents with calm affect and mood.  Medication given as prescribed.  Food and fluid intake encouraged.  Visible in milieu with minimal interaction.  Routine safety checks maintained every 15 minutes.  Patient is safe on the unit with supervision.

## 2018-08-13 NOTE — Progress Notes (Signed)
[facility] MD Progress Note  08/13/2018 10:39 AM Debbie Dorsey  MRN:  [id number] Subjective:   Patient in bed very sluggish she will give garbled sentence fragments still but she finally did sleep she is now sluggish oriented to person place situation presumably not answering questions clearly no EPS or TD/remains on one-to-one precautions  Principal Problem: Diagnosis: Active Problems:   Schizophrenia ([place])  Total Time spent with patient: 20 minutes  Past Medical History:  Past Medical History:  Diagnosis Date  . Diabetes mellitus without complication ([place])    History reviewed. No pertinent surgical history. Family History:  Family History  Problem Relation Age of Onset  . Mental illness Sister     Social History:  Social History   Substance and Sexual Activity  Alcohol Use No     Social History   Substance and Sexual Activity  Drug Use No    Social History   Socioeconomic History  . Marital status: Single    Spouse name: Not on file  . Number of children: Not on file  . Years of education: Not on file  . Highest education level: Not on file  Occupational History  . Not on file  Social Needs  . Financial resource strain: Not on file  . Food insecurity    Worry: Not on file    Inability: Not on file  . Transportation needs    Medical: Not on file    Non-medical: Not on file  Tobacco Use  . Smoking status: Never Smoker  . Smokeless tobacco: Never Used  Substance and Sexual Activity  . Alcohol use: No  . Drug use: No  . Sexual activity: Not Currently  Lifestyle  . Physical activity    Days per week: Not on file    Minutes per session: Not on file  . Stress: Not on file  Relationships  . Social Herbalist on phone: Not on file    Gets together: Not on file    Attends religious service: Not on file    Active member of club or organization: Not on file    Attends meetings of clubs or organizations: Not on file    Relationship status: Not on file   Other Topics Concern  . Not on file  Social History Narrative  . Not on file   Additional Social History:                         Sleep: Good  Appetite:  Good  Current Medications: Current Facility-Administered Medications  Medication Dose Route Frequency Provider Last Rate Last Dose  . acetaminophen (TYLENOL) tablet 650 mg  650 mg Oral Q6H PRN Sharma Covert, MD      . alum & mag hydroxide-simeth (MAALOX/MYLANTA) 200-200-20 MG/5ML suspension 30 mL  30 mL Oral Q4H PRN Sharma Covert, MD      . cloZAPine (CLOZARIL) tablet 75 mg  75 mg Oral QHS Johnn Hai, MD   75 mg at 08/12/18 2145  . gabapentin (NEURONTIN) capsule 200 mg  200 mg Oral TID Sharma Covert, MD   200 mg at 08/12/18 1729  . hydrOXYzine (ATARAX/VISTARIL) tablet 25 mg  25 mg Oral TID PRN Sharma Covert, MD   25 mg at 08/12/18 0206  . insulin aspart (novoLOG) injection 0-20 Units  0-20 Units Subcutaneous TID WC Laverle Hobby, PA-C   Stopped at 08/13/18 0654  . insulin aspart (novoLOG) injection 0-5  Units  0-5 Units Subcutaneous QHS Laverle Hobby, PA-C   2 Units at 08/10/18 2118  . insulin aspart (novoLOG) injection 6 Units  6 Units Subcutaneous TID WC Laverle Hobby, PA-C   Stopped at 08/13/18 [phone]  . insulin glargine (LANTUS) injection 22 Units  22 Units Subcutaneous Daily Sharma Covert, MD   22 Units at 08/12/18 0911  . magnesium hydroxide (MILK OF MAGNESIA) suspension 30 mL  30 mL Oral Daily PRN Sharma Covert, MD      . prenatal multivitamin tablet 1 tablet  1 tablet Oral Q1200 Johnn Hai, MD   1 tablet at 08/12/18 1226  . rivaroxaban (XARELTO) tablet 20 mg  20 mg Oral Daily Johnn Hai, MD   20 mg at 08/12/18 1729  . simvastatin (ZOCOR) tablet 40 mg  40 mg Oral q1800 Sharma Covert, MD   40 mg at 08/12/18 1729  . temazepam (RESTORIL) capsule 22.5 mg  22.5 mg Oral QHS Johnn Hai, MD   22.5 mg at 08/12/18 2145  . traZODone (DESYREL) tablet 50 mg  50 mg Oral QHS PRN  Sharma Covert, MD   50 mg at 08/11/18 2105  . ziprasidone (GEODON) injection 10 mg  10 mg Intramuscular Q6H PRN Sharma Covert, MD   10 mg at 08/12/18 7253    Lab Results:  Results for orders placed or performed during the hospital encounter of 07/31/18 (from the past 48 hour(s))  Glucose, capillary     Status: Abnormal   Collection Time: 08/11/18 11:35 AM  Result Value Ref Range   Glucose-Capillary 54 (L) 70 - 99 mg/dL  Glucose, capillary     Status: Abnormal   Collection Time: 08/11/18 11:36 AM  Result Value Ref Range   Glucose-Capillary 195 (H) 70 - 99 mg/dL  Glucose, capillary     Status: Abnormal   Collection Time: 08/11/18  5:10 PM  Result Value Ref Range   Glucose-Capillary 298 (H) 70 - 99 mg/dL  Glucose, capillary     Status: Abnormal   Collection Time: 08/11/18  8:05 PM  Result Value Ref Range   Glucose-Capillary 53 (L) 70 - 99 mg/dL  Glucose, capillary     Status: Abnormal   Collection Time: 08/11/18  8:55 PM  Result Value Ref Range   Glucose-Capillary 103 (H) 70 - 99 mg/dL  Glucose, capillary     Status: Abnormal   Collection Time: 08/12/18  5:52 AM  Result Value Ref Range   Glucose-Capillary 207 (H) 70 - 99 mg/dL  Glucose, capillary     Status: Abnormal   Collection Time: 08/12/18 11:53 AM  Result Value Ref Range   Glucose-Capillary 308 (H) 70 - 99 mg/dL  Glucose, capillary     Status: Abnormal   Collection Time: 08/12/18  5:15 PM  Result Value Ref Range   Glucose-Capillary 138 (H) 70 - 99 mg/dL   Comment 1 Notify RN    Comment 2 Document in Chart   Glucose, capillary     Status: Abnormal   Collection Time: 08/12/18  9:06 PM  Result Value Ref Range   Glucose-Capillary 142 (H) 70 - 99 mg/dL   Comment 1 Notify RN   Glucose, capillary     Status: Abnormal   Collection Time: 08/13/18  6:00 AM  Result Value Ref Range   Glucose-Capillary 254 (H) 70 - 99 mg/dL   Comment 1 Notify RN     Blood Alcohol level:  Lab Results  Component Value Date  ETH  <10 07/28/2018   ETH <10 00/86/7619    Metabolic Disorder Labs: Lab Results  Component Value Date   HGBA1C 8.5 (H) 07/29/2018   MPG 197 07/29/2018   MPG 191.51 05/26/2018   No results found for: PROLACTIN No results found for: CHOL, TRIG, HDL, CHOLHDL, VLDL, LDLCALC  Physical Findings: AIMS: Facial and Oral Movements Muscles of Facial Expression: None, normal Lips and Perioral Area: None, normal Jaw: None, normal Tongue: None, normal,Extremity Movements Upper (arms, wrists, hands, fingers): None, normal Lower (legs, knees, ankles, toes): None, normal, Trunk Movements Neck, shoulders, hips: None, normal, Overall Severity Severity of abnormal movements (highest score from questions above): None, normal Incapacitation due to abnormal movements: None, normal Patient's awareness of abnormal movements (rate only patient's report): No Awareness, Dental Status Current problems with teeth and/or dentures?: No Does patient usually wear dentures?: No  CIWA:  CIWA-Ar Total: 1 COWS:     Musculoskeletal: Strength & Muscle Tone: within normal limits Gait & Station: normal Patient leans: N/A  Psychiatric Specialty Exam: Physical Exam  ROS  Blood pressure 112/86, pulse (!) 133, temperature (!) 97.5 F (36.4 C), temperature source Oral, resp. rate 18, height 4\' 11"  (1.499 m), weight 41.3 kg, SpO2 100 %, unknown if currently breastfeeding.Body mass index is 18.38 kg/m.  General Appearance: Casual  Eye Contact:  Fair  Speech:  Garbled and Slow  Volume:  Decreased  Mood:  Dysphoric  Affect:  Blunt and Congruent  Thought Process:  Disorganized and Descriptions of Associations: Circumstantial  Orientation:  Full (Time, Place, and Person) but unable to articulate presumed person place situation  Thought Content:  Illogical  Suicidal Thoughts:  No  Homicidal Thoughts:  No  Memory:  Immediate;   Fair  Judgement:  Poor  Insight:  Shallow  Psychomotor Activity:  Decreased  Concentration:   Attention Span: Poor  Recall:  Poor  Fund of Knowledge:  Poor  Language:  Poor  Akathisia:  Negative  Handed:  Right  AIMS (if indicated):     Assets:  Physical Health Resilience  ADL's:  Intact  Cognition:  WNL  Sleep:  Number of Hours: 6.75     Treatment Plan Summary: Daily contact with patient to assess and evaluate symptoms and progress in treatment, Medication management and Plan Decrease clozapine decrease temazepam continue current precautions monitor for safety.  Somewhat treatment resistant but may progress with clozapine in time  [facility], MD 08/13/2018, 10:39 AM

## 2018-08-13 NOTE — Progress Notes (Signed)
Nursing Progress Note: 7p-7a D:Pt currently presents with a confused/depressed/paranoid/childlike/preoccupied/impulsiveaffect and behavior. Interacting appropriatelywith the milieu. Pt reports goodsleep during the previous night with current medication regimen.  A:Pt provided with medications per providers orders. Pt's labs and vitals were monitored throughout the night. Pt supported emotionally and encouraged to express concerns and questions. Pt educated on medications.  R:Pt's safety ensured with 15 minute and environmental checks. Pt currently denies SI, HI, and AVH. Pt verbally contracts to seek staff if SI,HI, or AVH occurs and to consult with staff before acting on any harmful thoughts. Will continue to monitor.  [place] NOVEL CORONAVIRUS (COVID-19) DAILY CHECK-OFF SYMPTOMS - answer yes or no to each - every day NO YES  Have you had a fever in the past 24 hours?  . Fever (Temp > 37.80C / 100F) X   Have you had any of these symptoms in the past 24 hours? . New Cough .  Sore Throat  .  Shortness of Breath .  Difficulty Breathing .  Unexplained Body Aches   X   Have you had any one of these symptoms in the past 24 hours not related to allergies?   . Runny Nose .  Nasal Congestion .  Sneezing   X   If you have had runny nose, nasal congestion, sneezing in the past 24 hours, has it worsened?  X   EXPOSURES - check yes or no X   Have you traveled outside the state in the past 14 days?  X   Have you been in contact with someone with a confirmed diagnosis of COVID-19 or PUI in the past 14 days without wearing appropriate PPE?  X   Have you been living in the same home as a person with confirmed diagnosis of COVID-19 or a PUI (household contact)?    X   Have you been diagnosed with COVID-19?    X              What to do next: Answered NO to all: Answered YES to anything:   Proceed with unit schedule Follow the [facility] Inpatient Flowsheet.

## 2018-08-14 LAB — GLUCOSE, CAPILLARY
Glucose-Capillary: 155 mg/dL — ABNORMAL HIGH (ref 70–99)
Glucose-Capillary: 336 mg/dL — ABNORMAL HIGH (ref 70–99)
Glucose-Capillary: 100 mg/dL — ABNORMAL HIGH (ref 70–99)
Glucose-Capillary: 266 mg/dL — ABNORMAL HIGH (ref 70–99)

## 2018-08-14 NOTE — Care Management (Signed)
CMA spoke with Debrah in regards to referral for ACTT services. The Coordinator, Deno Etienne is out of the office today. CMA was told to call back on Thursday, 7/9 to check on the ACTT referral.    CMA will follow up and notify CSW, [place].       Enrico Eaddy Care Management Assistant  Email:[email or web address] Office: [phone]

## 2018-08-14 NOTE — Tx Team (Signed)
Interdisciplinary Treatment and Diagnostic Plan Update  08/14/2018 Time of Session: 09:36am Debbie Dorsey MRN: [id number]  Principal Diagnosis: <principal problem not specified>  Secondary Diagnoses: Active Problems:   Schizophrenia ([place])   Current Medications:  Current Facility-Administered Medications  Medication Dose Route Frequency Provider Last Rate Last Dose  . acetaminophen (TYLENOL) tablet 650 mg  650 mg Oral Q6H PRN Sharma Covert, MD      . alum & mag hydroxide-simeth (MAALOX/MYLANTA) 200-200-20 MG/5ML suspension 30 mL  30 mL Oral Q4H PRN Sharma Covert, MD      . cloZAPine (CLOZARIL) tablet 75 mg  75 mg Oral QHS Johnn Hai, MD   75 mg at 08/13/18 2100  . gabapentin (NEURONTIN) capsule 200 mg  200 mg Oral TID Sharma Covert, MD   200 mg at 08/14/18 0932  . hydrOXYzine (ATARAX/VISTARIL) tablet 25 mg  25 mg Oral TID PRN Sharma Covert, MD   25 mg at 08/13/18 2100  . insulin aspart (novoLOG) injection 0-20 Units  0-20 Units Subcutaneous TID WC Patriciaann Clan E, PA-C   4 Units at 08/14/18 0934  . insulin aspart (novoLOG) injection 0-5 Units  0-5 Units Subcutaneous QHS Laverle Hobby, PA-C   2 Units at 08/10/18 2118  . insulin aspart (novoLOG) injection 6 Units  6 Units Subcutaneous TID WC Patriciaann Clan E, PA-C   6 Units at 08/14/18 0935  . insulin glargine (LANTUS) injection 22 Units  22 Units Subcutaneous Daily Sharma Covert, MD   22 Units at 08/14/18 [phone]  . magnesium hydroxide (MILK OF MAGNESIA) suspension 30 mL  30 mL Oral Daily PRN Sharma Covert, MD      . prenatal multivitamin tablet 1 tablet  1 tablet Oral Q1200 Johnn Hai, MD   1 tablet at 08/13/18 1111  . rivaroxaban (XARELTO) tablet 20 mg  20 mg Oral Daily Johnn Hai, MD   20 mg at 08/13/18 1702  . simvastatin (ZOCOR) tablet 40 mg  40 mg Oral q1800 Sharma Covert, MD   40 mg at 08/13/18 1702  . temazepam (RESTORIL) capsule 15 mg  15 mg Oral QHS Johnn Hai, MD   15 mg at 08/13/18  2100  . traZODone (DESYREL) tablet 50 mg  50 mg Oral QHS PRN Sharma Covert, MD   50 mg at 08/13/18 2100  . ziprasidone (GEODON) injection 10 mg  10 mg Intramuscular Q6H PRN Sharma Covert, MD   10 mg at 08/12/18 2248   PTA Medications: Medications Prior to Admission  Medication Sig Dispense Refill Last Dose  . benztropine (COGENTIN) 0.5 MG tablet Take 1 tablet (0.5 mg total) by mouth 2 (two) times daily. Restart on 06/07/2018 as per psychiatry (Patient not taking: Reported on 07/29/2018)     . benztropine (COGENTIN) 1 MG tablet Take 0.5 mg by mouth 2 (two) times a day.     . busPIRone (BUSPAR) 5 MG tablet Take 5 mg by mouth 3 (three) times daily.     . insulin NPH-regular Human (70-30) 100 UNIT/ML injection Inject 32 Units into the skin 2 (two) times daily with a meal.     . risperiDONE (RISPERDAL) 0.5 MG tablet Take 1 tablet (0.5 mg total) by mouth 2 (two) times daily. Start on 06/07/2018 as per psychiatry (Patient not taking: Reported on 07/29/2018) 30 tablet 0   . Rivaroxaban (XARELTO) 15 MG TABS tablet Take 15 mg by mouth 2 (two) times daily with a meal.     .  Rivaroxaban 15 & 20 MG TBPK Take as directed on package: Start with one 15mg  tablet by mouth twice a day with food. On Day 22, switch to one 20mg  tablet once a day with food. (Patient not taking: Reported on 07/29/2018) 51 each 0   . simvastatin (ZOCOR) 40 MG tablet Take 40 mg by mouth daily at 12 noon.      . temazepam (RESTORIL) 15 MG capsule Take 1 capsule (15 mg total) by mouth at bedtime as needed for sleep. (Patient not taking: Reported on 07/29/2018) 30 capsule 0   . traZODone (DESYREL) 100 MG tablet Take 100 mg by mouth daily.       Patient Stressors: Health problems Medication change or noncompliance Traumatic event  Patient Strengths: Supportive family/friends  Treatment Modalities: Medication Management, Group therapy, Case management,  1 to 1 session with clinician, Psychoeducation, Recreational  therapy.   Physician Treatment Plan for Primary Diagnosis: <principal problem not specified> Long Term Goal(s): Improvement in symptoms so as ready for discharge Improvement in symptoms so as ready for discharge   Short Term Goals: Ability to identify changes in lifestyle to reduce recurrence of condition will improve Ability to verbalize feelings will improve Ability to disclose and discuss suicidal ideas Ability to identify and develop effective coping behaviors will improve Ability to maintain clinical measurements within normal limits will improve Compliance with prescribed medications will improve  Medication Management: Evaluate patient's response, side effects, and tolerance of medication regimen.  Therapeutic Interventions: 1 to 1 sessions, Unit Group sessions and Medication administration.  Evaluation of Outcomes: Progressing  Physician Treatment Plan for Secondary Diagnosis: Active Problems:   Schizophrenia ([place])  Long Term Goal(s): Improvement in symptoms so as ready for discharge Improvement in symptoms so as ready for discharge   Short Term Goals: Ability to identify changes in lifestyle to reduce recurrence of condition will improve Ability to verbalize feelings will improve Ability to disclose and discuss suicidal ideas Ability to identify and develop effective coping behaviors will improve Ability to maintain clinical measurements within normal limits will improve Compliance with prescribed medications will improve     Medication Management: Evaluate patient's response, side effects, and tolerance of medication regimen.  Therapeutic Interventions: 1 to 1 sessions, Unit Group sessions and Medication administration.  Evaluation of Outcomes: Progressing   RN Treatment Plan for Primary Diagnosis: <principal problem not specified> Long Term Goal(s): Knowledge of disease and therapeutic regimen to maintain health will improve  Short Term Goals: Ability to  participate in decision making will improve, Ability to verbalize feelings will improve, Ability to disclose and discuss suicidal ideas, Ability to identify and develop effective coping behaviors will improve and Compliance with prescribed medications will improve  Medication Management: RN will administer medications as ordered by provider, will assess and evaluate patient's response and provide education to patient for prescribed medication. RN will report any adverse and/or side effects to prescribing provider.  Therapeutic Interventions: 1 on 1 counseling sessions, Psychoeducation, Medication administration, Evaluate responses to treatment, Monitor vital signs and CBGs as ordered, Perform/monitor CIWA, COWS, AIMS and Fall Risk screenings as ordered, Perform wound care treatments as ordered.  Evaluation of Outcomes: Progressing   LCSW Treatment Plan for Primary Diagnosis: <principal problem not specified> Long Term Goal(s): Safe transition to appropriate next level of care at discharge, Engage patient in therapeutic group addressing interpersonal concerns.  Short Term Goals: Engage patient in aftercare planning with referrals and resources and Increase skills for wellness and recovery  Therapeutic Interventions: Assess for  all discharge needs, 1 to 1 time with Education officer, museum, Explore available resources and support systems, Assess for adequacy in community support network, Educate family and significant other(s) on suicide prevention, Complete Psychosocial Assessment, Interpersonal group therapy.  Evaluation of Outcomes: Progressing   Progress in Treatment: Attending groups: Yes. Participating in groups: No. Taking medication as prescribed: Yes. Toleration medication: Yes. Family/Significant other contact made: Yes, individual(s) contacted:  pt's grandfather Patient understands diagnosis: No. Discussing patient identified problems/goals with staff: Yes. Medical problems stabilized or  resolved: Yes. Denies suicidal/homicidal ideation: Yes. Issues/concerns per patient self-inventory: No. Other:   New problem(s) identified: No, Describe:  None  New Short Term/Long Term Goal(s): Medication stabilization, elimination of SI thoughts, and development of a comprehensive mental wellness plan.   Patient Goals:    Discharge Plan or Barriers: CSW will continue to follow up for appropriate referrals and possible discharge planning. CSW is working on getting the patient an ACTT team for when she is discharged.   Reason for Continuation of Hospitalization: Delusions  Depression Medication stabilization  Estimated Length of Stay: 2-3 days  Attendees: Patient: 08/14/2018   Physician: Dr. Johnn Hai, MD  08/14/2018   Nursing: Vladimir Faster, RN  08/14/2018  RN Care Manager: 08/14/2018   Social Worker: Ardelle Anton, LCSW 08/14/2018  Recreational Therapist:  08/14/2018   Other:  08/14/2018  Other:  08/14/2018   Other: 08/14/2018     Scribe for Treatment Team: Trecia Rogers, LCSW 08/14/2018 9:58 AM

## 2018-08-14 NOTE — Progress Notes (Signed)
COProgress Note D:Pt currently sleeping in the bed. Patient appropriate to situation. Pt in no current distress.  A: No sitter required due to CO while awake  R:Pt remains safe on the unit.

## 2018-08-14 NOTE — Progress Notes (Signed)
[facility] MD Progress Note  08/14/2018 11:27 AM Debbie Dorsey  MRN:  [id number] Subjective:   Finally showing some progress as far as orientation and cognition she still sluggish but ambulating on her own.  No EPS or TD.  Patient is alert and oriented to person place situation and general time of day and denies auditory or visual hallucinations. Showing improvement but condition fragile Principal Problem: Exacerbation and underlying psychotic disorder Diagnosis: Active Problems:   Schizophrenia ([place])  Total Time spent with patient: 20 minutes   Past Medical History:  Past Medical History:  Diagnosis Date  . Diabetes mellitus without complication ([place])    History reviewed. No pertinent surgical history. Family History:  Family History  Problem Relation Age of Onset  . Mental illness Sister     Social History:  Social History   Substance and Sexual Activity  Alcohol Use No     Social History   Substance and Sexual Activity  Drug Use No    Social History   Socioeconomic History  . Marital status: Single    Spouse name: Not on file  . Number of children: Not on file  . Years of education: Not on file  . Highest education level: Not on file  Occupational History  . Not on file  Social Needs  . Financial resource strain: Not on file  . Food insecurity    Worry: Not on file    Inability: Not on file  . Transportation needs    Medical: Not on file    Non-medical: Not on file  Tobacco Use  . Smoking status: Never Smoker  . Smokeless tobacco: Never Used  Substance and Sexual Activity  . Alcohol use: No  . Drug use: No  . Sexual activity: Not Currently  Lifestyle  . Physical activity    Days per week: Not on file    Minutes per session: Not on file  . Stress: Not on file  Relationships  . Social Herbalist on phone: Not on file    Gets together: Not on file    Attends religious service: Not on file    Active member of club or organization: Not on file     Attends meetings of clubs or organizations: Not on file    Relationship status: Not on file  Other Topics Concern  . Not on file  Social History Narrative  . Not on file   Additional Social History:                         Sleep: Good  Appetite:  Good  Current Medications: Current Facility-Administered Medications  Medication Dose Route Frequency Provider Last Rate Last Dose  . acetaminophen (TYLENOL) tablet 650 mg  650 mg Oral Q6H PRN Sharma Covert, MD      . alum & mag hydroxide-simeth (MAALOX/MYLANTA) 200-200-20 MG/5ML suspension 30 mL  30 mL Oral Q4H PRN Sharma Covert, MD      . cloZAPine (CLOZARIL) tablet 75 mg  75 mg Oral QHS Johnn Hai, MD   75 mg at 08/13/18 2100  . gabapentin (NEURONTIN) capsule 200 mg  200 mg Oral TID Sharma Covert, MD   200 mg at 08/14/18 0932  . hydrOXYzine (ATARAX/VISTARIL) tablet 25 mg  25 mg Oral TID PRN Sharma Covert, MD   25 mg at 08/13/18 2100  . insulin aspart (novoLOG) injection 0-20 Units  0-20 Units Subcutaneous TID WC Simon,  Maurine Minister, PA-C   4 Units at 08/14/18 [phone]  . insulin aspart (novoLOG) injection 0-5 Units  0-5 Units Subcutaneous QHS Laverle Hobby, PA-C   2 Units at 08/10/18 2118  . insulin aspart (novoLOG) injection 6 Units  6 Units Subcutaneous TID WC Patriciaann Clan E, PA-C   6 Units at 08/14/18 0935  . insulin glargine (LANTUS) injection 22 Units  22 Units Subcutaneous Daily Sharma Covert, MD   22 Units at 08/14/18 [phone]  . magnesium hydroxide (MILK OF MAGNESIA) suspension 30 mL  30 mL Oral Daily PRN Sharma Covert, MD      . prenatal multivitamin tablet 1 tablet  1 tablet Oral Q1200 Johnn Hai, MD   1 tablet at 08/13/18 1111  . rivaroxaban (XARELTO) tablet 20 mg  20 mg Oral Daily Johnn Hai, MD   20 mg at 08/13/18 1702  . simvastatin (ZOCOR) tablet 40 mg  40 mg Oral q1800 Sharma Covert, MD   40 mg at 08/13/18 1702  . temazepam (RESTORIL) capsule 15 mg  15 mg Oral QHS Johnn Hai,  MD   15 mg at 08/13/18 2100  . traZODone (DESYREL) tablet 50 mg  50 mg Oral QHS PRN Sharma Covert, MD   50 mg at 08/13/18 2100  . ziprasidone (GEODON) injection 10 mg  10 mg Intramuscular Q6H PRN Sharma Covert, MD   10 mg at 08/12/18 3846    Lab Results:  Results for orders placed or performed during the hospital encounter of 07/31/18 (from the past 48 hour(s))  Glucose, capillary     Status: Abnormal   Collection Time: 08/12/18 11:53 AM  Result Value Ref Range   Glucose-Capillary 308 (H) 70 - 99 mg/dL  Glucose, capillary     Status: Abnormal   Collection Time: 08/12/18  5:15 PM  Result Value Ref Range   Glucose-Capillary 138 (H) 70 - 99 mg/dL   Comment 1 Notify RN    Comment 2 Document in Chart   Glucose, capillary     Status: Abnormal   Collection Time: 08/12/18  9:06 PM  Result Value Ref Range   Glucose-Capillary 142 (H) 70 - 99 mg/dL   Comment 1 Notify RN   Glucose, capillary     Status: Abnormal   Collection Time: 08/13/18  6:00 AM  Result Value Ref Range   Glucose-Capillary 254 (H) 70 - 99 mg/dL   Comment 1 Notify RN   Glucose, capillary     Status: Abnormal   Collection Time: 08/13/18 11:57 AM  Result Value Ref Range   Glucose-Capillary 387 (H) 70 - 99 mg/dL   Comment 1 Notify RN    Comment 2 Document in Chart   Glucose, capillary     Status: Abnormal   Collection Time: 08/13/18  4:39 PM  Result Value Ref Range   Glucose-Capillary 148 (H) 70 - 99 mg/dL  Glucose, capillary     Status: None   Collection Time: 08/13/18  8:08 PM  Result Value Ref Range   Glucose-Capillary 71 70 - 99 mg/dL  Glucose, capillary     Status: Abnormal   Collection Time: 08/14/18  5:57 AM  Result Value Ref Range   Glucose-Capillary 155 (H) 70 - 99 mg/dL    Blood Alcohol level:  Lab Results  Component Value Date   ETH <10 07/28/2018   ETH <10 65/99/3570    Metabolic Disorder Labs: Lab Results  Component Value Date   HGBA1C 8.5 (H) 07/29/2018  MPG 197 07/29/2018   MPG  191.51 05/26/2018   No results found for: PROLACTIN No results found for: CHOL, TRIG, HDL, CHOLHDL, VLDL, LDLCALC  Physical Findings: AIMS: Facial and Oral Movements Muscles of Facial Expression: None, normal Lips and Perioral Area: None, normal Jaw: None, normal Tongue: None, normal,Extremity Movements Upper (arms, wrists, hands, fingers): None, normal Lower (legs, knees, ankles, toes): None, normal, Trunk Movements Neck, shoulders, hips: None, normal, Overall Severity Severity of abnormal movements (highest score from questions above): None, normal Incapacitation due to abnormal movements: None, normal Patient's awareness of abnormal movements (rate only patient's report): No Awareness, Dental Status Current problems with teeth and/or dentures?: No Does patient usually wear dentures?: No  CIWA:  CIWA-Ar Total: 1 COWS:     Musculoskeletal: Strength & Muscle Tone: within normal limits Gait & Station: normal Patient leans: N/A  Psychiatric Specialty Exam: Physical Exam  ROS  Blood pressure 112/86, pulse (!) 133, temperature (!) 97.5 F (36.4 C), temperature source Oral, resp. rate 18, height 4\' 11"  (1.499 m), weight 41.3 kg, SpO2 100 %, unknown if currently breastfeeding.Body mass index is 18.38 kg/m.  General Appearance: Casual  Eye Contact:  Good  Speech: Slow soft and hesitant but no longer giving sentence fragments  Volume: Low volume  Mood:  Dysphoric  Affect:  Constricted  Thought Process:  Irrelevant and Descriptions of Associations: Tangential  Orientation:  Full (Time, Place, and Person)  Thought Content:  Illogical and Delusions in the sense there is confabulation with detailed questioning  Suicidal Thoughts:  No  Homicidal Thoughts:  No  Memory:  Immediate;   Fair  Judgement: Fair  Insight:  Shallow  Psychomotor Activity:  lower  Concentration:  Concentration: Fair  Recall:  Poor  Fund of Knowledge:  Fair  Language:  Fair  Akathisia:  Negative  Handed:   Right  AIMS (if indicated):     Assets:  Resilience Social Support  ADL's:  Intact  Cognition:  WNL  Sleep:  Number of Hours: 6.75     Treatment Plan Summary: Daily contact with patient to assess and evaluate symptoms and progress in treatment and Medication management continue clozapine and current measures no change in precautions today continue to monitor  Denee Boeder, MD 08/14/2018, 11:27 AM

## 2018-08-14 NOTE — Progress Notes (Signed)
MEDICATION RELATED CONSULT NOTE - FOLLOW UP   Pharmacy Consult forClozapine   Patient enrolled in clozapine rems.  weekly blood draws are required.  Lenox Ponds 08/14/2018,3:05 PM

## 2018-08-14 NOTE — Plan of Care (Signed)
D: Patient is alert, confused, and cooperative. Denies SI, HI, AVH, and verbally contracts for safety. Patient denies physical symptoms/pain.    A: Medications administered per MD order. Support provided. Patient educated on safety on the unit and medications. Routine safety checks every 15 minutes. Patient stated understanding to tell nurse about any new physical symptoms. Patient understands to tell staff of any needs.     R: No adverse drug reactions noted. Patient verbally contracts for safety. Patient remains safe at this time and will continue to monitor.   Problem: Safety: Goal: Periods of time without injury will increase Outcome: Progressing   Patient remains safe and will continue to monitor.   [place] NOVEL CORONAVIRUS (COVID-19) DAILY CHECK-OFF SYMPTOMS - answer yes or no to each - every day NO YES  Have you had a fever in the past 24 hours?  Fever (Temp > 37.80C / 100F) X   Have you had any of these symptoms in the past 24 hours? New Cough  Sore Throat   Shortness of Breath  Difficulty Breathing  Unexplained Body Aches   X   Have you had any one of these symptoms in the past 24 hours not related to allergies?   Runny Nose  Nasal Congestion  Sneezing   X   If you have had runny nose, nasal congestion, sneezing in the past 24 hours, has it worsened?  X   EXPOSURES - check yes or no X   Have you traveled outside the state in the past 14 days?  X   Have you been in contact with someone with a confirmed diagnosis of COVID-19 or PUI in the past 14 days without wearing appropriate PPE?  X   Have you been living in the same home as a person with confirmed diagnosis of COVID-19 or a PUI (household contact)?    X   Have you been diagnosed with COVID-19?    X              What to do next: Answered NO to all: Answered YES to anything:   Proceed with unit schedule Follow the [facility] Inpatient Flowsheet.

## 2018-08-14 NOTE — Progress Notes (Signed)
CO note:  Patient is observed awake in bed with MHT in arms reach. No distress noted. Patient remains safe. CO maintained for safety.

## 2018-08-15 LAB — GLUCOSE, CAPILLARY
Glucose-Capillary: 189 mg/dL — ABNORMAL HIGH (ref 70–99)
Glucose-Capillary: 307 mg/dL — ABNORMAL HIGH (ref 70–99)
Glucose-Capillary: 63 mg/dL — ABNORMAL LOW (ref 70–99)
Glucose-Capillary: 154 mg/dL — ABNORMAL HIGH (ref 70–99)
Glucose-Capillary: 103 mg/dL — ABNORMAL HIGH (ref 70–99)

## 2018-08-15 NOTE — Progress Notes (Signed)
CO note:   Patient is observed sleeping in bed. Respirations even and unlabored. No distress noted. Patient remains safe. CO will continue when awake for safety.

## 2018-08-15 NOTE — [facility] Group Notes (Signed)
[facility] LCSW Group Therapy Note  Date/Time: 08/15/2018 @ 1:15pm  Type of Therapy/Topic:  Group Therapy:  Feelings about Diagnosis  Participation Level:  Did Not Attend   Mood: Did not attend    Description of Group:    This group will allow patients to explore their thoughts and feelings about diagnoses they have received. Patients will be guided to explore their level of understanding and acceptance of these diagnoses. Facilitator will encourage patients to process their thoughts and feelings about the reactions of others to their diagnosis, and will guide patients in identifying ways to discuss their diagnosis with significant others in their lives. This group will be process-oriented, with patients participating in exploration of their own experiences as well as giving and receiving support and challenge from other group members.   Therapeutic Goals: 1. Patient will demonstrate understanding of diagnosis as evidence by identifying two or more symptoms of the disorder:  2. Patient will be able to express two feelings regarding the diagnosis 3. Patient will demonstrate ability to communicate their needs through discussion and/or role plays  Summary of Patient Progress:   Pt did not attend group.      Therapeutic Modalities:   Cognitive Behavioral Therapy Brief Therapy Feelings Identification   Ardelle Anton, LCSW

## 2018-08-15 NOTE — Progress Notes (Signed)
Patient ID: Debbie Dorsey, female   DOB: January 13, 1971, 48 y.o.   MRN: [id number]   CSW contacted pt's grandfather to give an update on how the pt is doing. CSW let the pt's grandfather know that the pt was accepted into the [facility] ACTT team and that starts on 8/6. CSW gave information on ACTT teams and their roles of treatment. CSW discussed having a service in the middle [facility]) until ACTT starts. CSW explained how the pt has been doing over the past few days and ensured the pt's grandfather that the CSW will continue to call with updates.

## 2018-08-15 NOTE — Progress Notes (Signed)
CO note:  Patient is observed awake in bed with MHT in arms reach. No distress noted. Patient remains safe. CO maintained for safety.

## 2018-08-15 NOTE — Progress Notes (Signed)
Patient is being monitored close observation due to confused behaviors.  Patient denies SI, HI and AVH this shift.  Patient has been maintained safely on the unit with the assistance of 1:1 staff.  Staff noted in close proximity to patient.

## 2018-08-15 NOTE — Progress Notes (Signed)
[facility] MD Progress Note  08/15/2018 12:50 PM Debbie Dorsey  MRN:  [id number] Subjective:   Patient able to participate better give him a short but coherent sentences she knows her location not the date or day she denies any auditory or visual hallucinations or thoughts of self-harm.  No EPS or TD. Principal Problem: Schizophrenic exacerbation in general treatment resistant resulting in clozapine trial with some success thus far  diagnosis: Active Problems:   Schizophrenia ([place])  Total Time spent with patient: 20 minutes  Past Medical History:  Past Medical History:  Diagnosis Date  . Diabetes mellitus without complication ([place])    History reviewed. No pertinent surgical history. Family History:  Family History  Problem Relation Age of Onset  . Mental illness Sister     Social History:  Social History   Substance and Sexual Activity  Alcohol Use No     Social History   Substance and Sexual Activity  Drug Use No    Social History   Socioeconomic History  . Marital status: Single    Spouse name: Not on file  . Number of children: Not on file  . Years of education: Not on file  . Highest education level: Not on file  Occupational History  . Not on file  Social Needs  . Financial resource strain: Not on file  . Food insecurity    Worry: Not on file    Inability: Not on file  . Transportation needs    Medical: Not on file    Non-medical: Not on file  Tobacco Use  . Smoking status: Never Smoker  . Smokeless tobacco: Never Used  Substance and Sexual Activity  . Alcohol use: No  . Drug use: No  . Sexual activity: Not Currently  Lifestyle  . Physical activity    Days per week: Not on file    Minutes per session: Not on file  . Stress: Not on file  Relationships  . Social Herbalist on phone: Not on file    Gets together: Not on file    Attends religious service: Not on file    Active member of club or organization: Not on file    Attends meetings  of clubs or organizations: Not on file    Relationship status: Not on file  Other Topics Concern  . Not on file  Social History Narrative  . Not on file   Additional Social History:                         Sleep: fair  Appetite:  Fair  Current Medications: Current Facility-Administered Medications  Medication Dose Route Frequency Provider Last Rate Last Dose  . acetaminophen (TYLENOL) tablet 650 mg  650 mg Oral Q6H PRN Sharma Covert, MD      . alum & mag hydroxide-simeth (MAALOX/MYLANTA) 200-200-20 MG/5ML suspension 30 mL  30 mL Oral Q4H PRN Sharma Covert, MD      . cloZAPine (CLOZARIL) tablet 75 mg  75 mg Oral QHS Johnn Hai, MD   75 mg at 08/14/18 2035  . gabapentin (NEURONTIN) capsule 200 mg  200 mg Oral TID Sharma Covert, MD   200 mg at 08/15/18 4098  . hydrOXYzine (ATARAX/VISTARIL) tablet 25 mg  25 mg Oral TID PRN Sharma Covert, MD   25 mg at 08/13/18 2100  . insulin aspart (novoLOG) injection 0-20 Units  0-20 Units Subcutaneous TID WC Laverle Hobby,  PA-C   4 Units at 08/15/18 3546  . insulin aspart (novoLOG) injection 0-5 Units  0-5 Units Subcutaneous QHS Laverle Hobby, PA-C   3 Units at 08/14/18 2117  . insulin aspart (novoLOG) injection 6 Units  6 Units Subcutaneous TID WC Patriciaann Clan E, PA-C   6 Units at 08/15/18 [phone]  . insulin glargine (LANTUS) injection 22 Units  22 Units Subcutaneous Daily Sharma Covert, MD   22 Units at 08/15/18 [phone]  . magnesium hydroxide (MILK OF MAGNESIA) suspension 30 mL  30 mL Oral Daily PRN Sharma Covert, MD      . prenatal multivitamin tablet 1 tablet  1 tablet Oral Q1200 Johnn Hai, MD   1 tablet at 08/14/18 1323  . rivaroxaban (XARELTO) tablet 20 mg  20 mg Oral Daily Johnn Hai, MD   20 mg at 08/14/18 1720  . simvastatin (ZOCOR) tablet 40 mg  40 mg Oral q1800 Sharma Covert, MD   40 mg at 08/14/18 1720  . temazepam (RESTORIL) capsule 15 mg  15 mg Oral QHS Johnn Hai, MD   15 mg at  08/14/18 2035  . traZODone (DESYREL) tablet 50 mg  50 mg Oral QHS PRN Sharma Covert, MD   50 mg at 08/14/18 2035  . ziprasidone (GEODON) injection 10 mg  10 mg Intramuscular Q6H PRN Sharma Covert, MD   10 mg at 08/12/18 7001    Lab Results:  Results for orders placed or performed during the hospital encounter of 07/31/18 (from the past 48 hour(s))  Glucose, capillary     Status: Abnormal   Collection Time: 08/13/18  4:39 PM  Result Value Ref Range   Glucose-Capillary 148 (H) 70 - 99 mg/dL  Glucose, capillary     Status: None   Collection Time: 08/13/18  8:08 PM  Result Value Ref Range   Glucose-Capillary 71 70 - 99 mg/dL  Glucose, capillary     Status: Abnormal   Collection Time: 08/14/18  5:57 AM  Result Value Ref Range   Glucose-Capillary 155 (H) 70 - 99 mg/dL  Glucose, capillary     Status: Abnormal   Collection Time: 08/14/18 12:27 PM  Result Value Ref Range   Glucose-Capillary 336 (H) 70 - 99 mg/dL  Glucose, capillary     Status: Abnormal   Collection Time: 08/14/18  5:14 PM  Result Value Ref Range   Glucose-Capillary 100 (H) 70 - 99 mg/dL  Glucose, capillary     Status: Abnormal   Collection Time: 08/14/18  8:12 PM  Result Value Ref Range   Glucose-Capillary 266 (H) 70 - 99 mg/dL  Glucose, capillary     Status: Abnormal   Collection Time: 08/15/18  6:36 AM  Result Value Ref Range   Glucose-Capillary 189 (H) 70 - 99 mg/dL    Blood Alcohol level:  Lab Results  Component Value Date   ETH <10 07/28/2018   ETH <10 74/94/4967    Metabolic Disorder Labs: Lab Results  Component Value Date   HGBA1C 8.5 (H) 07/29/2018   MPG 197 07/29/2018   MPG 191.51 05/26/2018   No results found for: PROLACTIN No results found for: CHOL, TRIG, HDL, CHOLHDL, VLDL, LDLCALC  Physical Findings: AIMS: Facial and Oral Movements Muscles of Facial Expression: None, normal Lips and Perioral Area: None, normal Jaw: None, normal Tongue: None, normal,Extremity Movements Upper  (arms, wrists, hands, fingers): None, normal Lower (legs, knees, ankles, toes): None, normal, Trunk Movements Neck, shoulders, hips: None, normal, Overall Severity  Severity of abnormal movements (highest score from questions above): None, normal Incapacitation due to abnormal movements: None, normal Patient's awareness of abnormal movements (rate only patient's report): No Awareness, Dental Status Current problems with teeth and/or dentures?: No Does patient usually wear dentures?: No  CIWA:  CIWA-Ar Total: 1 COWS:     Musculoskeletal: Strength & Muscle Tone: within normal limits Gait & Station: normal Patient leans: N/A  Psychiatric Specialty Exam: Physical Exam  ROS  Blood pressure 108/74, pulse (!) 128, temperature (!) 97.5 F (36.4 C), temperature source Oral, resp. rate 18, height 4\' 11"  (1.499 m), weight 41.3 kg, SpO2 100 %, unknown if currently breastfeeding.Body mass index is 18.38 kg/m.  General Appearance: Casual  Eye Contact:  Fair  Speech:  Slow  Volume:  Decreased  Mood:  Dysphoric  Affect:  Blunt  Thought Process:  Linear and Descriptions of Associations: Circumstantial  Orientation: Person place and situation  Thought Content:  Probable poverty of content  Suicidal Thoughts:  No  Homicidal Thoughts:  No  Memory:  Immediate;   Poor  Judgement:  Poor  Insight:  Fair  Psychomotor Activity:  Decreased  Concentration:  Concentration: Poor  Recall:  [place] of Knowledge:  Fair  Language:  Fair  Akathisia:  Negative  Handed:  Right  AIMS (if indicated):     Assets:  Physical Health Resilience  ADL's:  Intact  Cognition:  WNL  Sleep:  Number of Hours: 6.75     Treatment Plan Summary: Daily contact with patient to assess and evaluate symptoms and progress in treatment, Medication management and Plan Continue clozapine therapy continue current precautions and current level of monitoring no change in precautions or meds today  Tonetta Napoles, MD 08/15/2018,  12:50 PM

## 2018-08-15 NOTE — Care Management (Signed)
CMA spoke with Debbie Dorsey with [facility]. Patient is scheduled for an assessment appointment on Thursday, 8/6 at 12:00p for ACTT services.    CMA will notify CSW, Jasmine.      Ayrianna Mcginniss Care Management Assistant  Email:[email or web address] Office: [phone]

## 2018-08-15 NOTE — Progress Notes (Signed)
Did not attend group 

## 2018-08-16 LAB — GLUCOSE, CAPILLARY
Glucose-Capillary: 116 mg/dL — ABNORMAL HIGH (ref 70–99)
Glucose-Capillary: 193 mg/dL — ABNORMAL HIGH (ref 70–99)
Glucose-Capillary: 283 mg/dL — ABNORMAL HIGH (ref 70–99)
Glucose-Capillary: 250 mg/dL — ABNORMAL HIGH (ref 70–99)

## 2018-08-16 LAB — CBC WITH DIFFERENTIAL/PLATELET
Abs Immature Granulocytes: 0.03 10*3/uL (ref 0.00–0.07)
Basophils Absolute: 0 10*3/uL (ref 0.0–0.1)
Basophils Relative: 1 %
Eosinophils Absolute: 0.2 10*3/uL (ref 0.0–0.5)
Eosinophils Relative: 3 %
HCT: 39.4 % (ref 36.0–46.0)
Hemoglobin: 12.3 g/dL (ref 12.0–15.0)
Immature Granulocytes: 0 %
Lymphocytes Relative: 45 %
Lymphs Abs: 3.1 10*3/uL (ref 0.7–4.0)
MCH: 32.7 pg (ref 26.0–34.0)
MCHC: 31.2 g/dL (ref 30.0–36.0)
MCV: 104.8 fL — ABNORMAL HIGH (ref 80.0–100.0)
Monocytes Absolute: 0.5 10*3/uL (ref 0.1–1.0)
Monocytes Relative: 7 %
Neutro Abs: 3 10*3/uL (ref 1.7–7.7)
Neutrophils Relative %: 44 %
Platelets: 324 10*3/uL (ref 150–400)
RBC: 3.76 MIL/uL — ABNORMAL LOW (ref 3.87–5.11)
RDW: 14 % (ref 11.5–15.5)
WBC: 6.8 10*3/uL (ref 4.0–10.5)
nRBC: 0 % (ref 0.0–0.2)

## 2018-08-16 NOTE — Progress Notes (Signed)
Patient ID: Debbie Dorsey, female   DOB: Aug 15, 1970, 48 y.o.   MRN: [id number]  [place] NOVEL CORONAVIRUS (COVID-19) DAILY CHECK-OFF SYMPTOMS - answer yes or no to each - every day NO YES  Have you had a fever in the past 24 hours?  . Fever (Temp > 37.80C / 100F) X   Have you had any of these symptoms in the past 24 hours? . New Cough .  Sore Throat  .  Shortness of Breath .  Difficulty Breathing .  Unexplained Body Aches   X   Have you had any one of these symptoms in the past 24 hours not related to allergies?   . Runny Nose .  Nasal Congestion .  Sneezing   X   If you have had runny nose, nasal congestion, sneezing in the past 24 hours, has it worsened?  X   EXPOSURES - check yes or no X   Have you traveled outside the state in the past 14 days?  X   Have you been in contact with someone with a confirmed diagnosis of COVID-19 or PUI in the past 14 days without wearing appropriate PPE?  X   Have you been living in the same home as a person with confirmed diagnosis of COVID-19 or a PUI (household contact)?    X   Have you been diagnosed with COVID-19?    X              What to do next: Answered NO to all: Answered YES to anything:   Proceed with unit schedule Follow the [facility] Inpatient Flowsheet.

## 2018-08-16 NOTE — Progress Notes (Addendum)
[facility] 1:1 Observation Documentation  For the first (8) hours following discontinuation of 1:1 precautions, a progress note entry by nursing staff should be documented at least every 2 hours, reflecting the patient's behavior, condition, mood, and conversation.  Use the progress notes for additional entries.  Time 1:1 discontinued:  1400  Patient's Behavior: ambulating in room, and hallway; Sitting in day room; at at nurse's station  Patient's Condition:  Pleasant, appropriate  Patient's Conversation:  Pt whispering; incoherent.   Harriet Masson 08/16/2018, 7:42 PM

## 2018-08-16 NOTE — Plan of Care (Signed)
D: Patient is alert, pleasant, and cooperative. Denies SI, HI, AVH, and verbally contracts for safety.    A: Medications administered per MD order. Support provided. Patient educated on safety on the unit and medications. Routine safety checks every 15 minutes. Patient stated understanding to tell nurse about any new physical symptoms. Patient understands to tell staff of any needs.     R: No adverse drug reactions noted. Patient verbally contracts for safety. Patient remains safe at this time and will continue to monitor.   Problem: Safety: Goal: Periods of time without injury will increase Outcome: Progressing   Patient remains safe and will continue to monitor.   [place] NOVEL CORONAVIRUS (COVID-19) DAILY CHECK-OFF SYMPTOMS - answer yes or no to each - every day NO YES  Have you had a fever in the past 24 hours?  Fever (Temp > 37.80C / 100F) X   Have you had any of these symptoms in the past 24 hours? New Cough  Sore Throat   Shortness of Breath  Difficulty Breathing  Unexplained Body Aches   X   Have you had any one of these symptoms in the past 24 hours not related to allergies?   Runny Nose  Nasal Congestion  Sneezing   X   If you have had runny nose, nasal congestion, sneezing in the past 24 hours, has it worsened?  X   EXPOSURES - check yes or no X   Have you traveled outside the state in the past 14 days?  X   Have you been in contact with someone with a confirmed diagnosis of COVID-19 or PUI in the past 14 days without wearing appropriate PPE?  X   Have you been living in the same home as a person with confirmed diagnosis of COVID-19 or a PUI (household contact)?    X   Have you been diagnosed with COVID-19?    X              What to do next: Answered NO to all: Answered YES to anything:   Proceed with unit schedule Follow the [facility] Inpatient Flowsheet.

## 2018-08-16 NOTE — Progress Notes (Signed)
[facility] 1:1 Observation Documentation  For the first (8) hours following discontinuation of 1:1 precautions, a progress note entry by nursing staff should be documented at least every 2 hours, reflecting the patient's behavior, condition, mood, and conversation.  Use the progress notes for additional entries.  Time 1:1 discontinued:  1400  Patient's Behavior:  Pt remains lying quietly in her bed.   Patient's Condition:  Pleasant but minimal.  She appears to be in no physical distress.  Patient's Conversation:  Minimal.  She is incoherent at times but will repeat phrases that RN asked her.    [place] 08/16/2018, 10:37 PM

## 2018-08-16 NOTE — Progress Notes (Signed)
Patient ID: Debbie Dorsey, female   DOB: January 25, 1971, 48 y.o.   MRN: [id number]   CSW contacted pt's grandfather and gave him an update regarding pt's progression. Pt's grandfather requested to speak with the doctor. Pt's grandfather was transferred via phone.

## 2018-08-16 NOTE — Progress Notes (Signed)
[facility] [place] [facility] 1:1 Observation Documentation  For the first (8) hours following discontinuation of 1:1 precautions, a progress note entry by nursing staff should be documented at least every 2 hours, reflecting the patient's behavior, condition, mood, and conversation.  Use the progress notes for additional entries.  Time 1:1 discontinued:  1400  Patient's Behavior:  Lying quietly in her room.  Patient's Condition:  She appears to be in no physical distress.  She is staring blankly at the wall.  She voiced no SI/HI or A/V hallucinations.  Patient's Conversation:  Minimal and incoherent at times but pleasant.  Barbette Or Imelda Dandridge 08/16/2018, 10:28 PM

## 2018-08-16 NOTE — Progress Notes (Signed)
Patient ID: Debbie Dorsey, female   DOB: Apr 03, 1970, 48 y.o.   MRN: [id number]   Pt's public defender, Lisette Grinder [phone], called and left a message with a patient. CSW was told to contact the public defender back. CSW was unable to get in touch with the public defender but did leave a voicemail.

## 2018-08-16 NOTE — Progress Notes (Signed)
Spirituality group facilitated by chaplain Arabelle Bollig, MDiv, [facility]   Group Description  Spirituality group focused on topic of "hope."   Pts responded to topic and, engaged in facilitated dialog, expressing their understanding of topic and where they see this in their life.   Group facilitation drew on elements of client-centered, humanist / existential and psycho-dynamic group process.  Pt participation:   Did not attend group  

## 2018-08-16 NOTE — Progress Notes (Signed)
Patient ID: Debbie Dorsey, female   DOB: [birth year], 48 y.o.   MRN: [id number]   1:1 Note  D: Pt asleep on her bed with even and unlabored respirations. Pt remains on close observation while awake for safety. No signs of distress or discomfort noted.  A: Close observation remains for pt's safety.  R: Pt remains safe on the unit.

## 2018-08-16 NOTE — Plan of Care (Signed)
D: Pt alert and oriented on the unit. Pt was on close observation while awake. Pt has been appropriate and cooperative. Pt showed no signs of confusion this morning or afternoon and is pleasant and cooperative on the unit. Close observation discontinued per MD order.  A: Education, support and encouragement provided, q15 minute safety checks remain in effect. Medications administered per MD orders.  R: No reactions/side effects to medicine noted. Pt denies any concerns at this time, and verbally contracts for safety. Pt ambulating on the unit with no issues. Pt remains safe on the unit.   Problem: Education: Goal: Mental status will improve Outcome: Progressing   Problem: Safety: Goal: Periods of time without injury will increase Outcome: Progressing

## 2018-08-16 NOTE — Progress Notes (Signed)
CO note:   Patient is observed sleeping in bed. Respirations even and unlabored. No distress noted. Patient remains safe. CO will continue when awake for safety.

## 2018-08-16 NOTE — Progress Notes (Signed)
    [facility] 1:1 Observation Documentation  For the first (8) hours following discontinuation of 1:1 precautions, a progress note entry by nursing staff should be documented at least every 2 hours, reflecting the patient's behavior, condition, mood, and conversation.  Use the progress notes for additional entries.  Time 1:1 discontinued:  1400  Patient's Behavior: ambulating in room, and hallway; standing at nurse's station.  Patient's Condition:  Pleasant, appropriate  Patient's Conversation:  Pt whispering; incoherent

## 2018-08-16 NOTE — Progress Notes (Signed)
CO note:   Patient is observed sleeping in bed. Respirations even and unlabored. No distress noted.   Patient was too sedated to get up to use the restroom this morning. MHT noticed sheets were wet and sheets were changed shortly before 0600. Patient went back to sleep.  Patient remains safe. CO will continue when awake for safety.

## 2018-08-17 DIAGNOSIS — F209 Schizophrenia, unspecified: Principal | ICD-10-CM

## 2018-08-17 LAB — GLUCOSE, CAPILLARY
Glucose-Capillary: 110 mg/dL — ABNORMAL HIGH (ref 70–99)
Glucose-Capillary: 91 mg/dL (ref 70–99)
Glucose-Capillary: 182 mg/dL — ABNORMAL HIGH (ref 70–99)
Glucose-Capillary: 264 mg/dL — ABNORMAL HIGH (ref 70–99)

## 2018-08-17 NOTE — Progress Notes (Signed)
D: Patient presents flat, confused, ambivalent. Patient denies SI/HI/AVH. She is medication compliant. She refused to fill out a self inventory. Blood sugar is under better control. Patient is disorganized and doesn't speak often.  A: Patient checked q15 min, and checks reviewed. Reviewed medication changes with patient and educated on side effects. Educated patient on importance of attending group therapy sessions and educated on several coping skills. Encouarged participation in milieu through recreation therapy and attending meals with peers. Support and encouragement provided. Fluids offered. R: Patient receptive to education on medications, and is medication compliant. Patient contracts for safety on the unit.

## 2018-08-17 NOTE — [facility] Group Notes (Signed)
[facility] Group Notes: (Clinical Social Work)   08/17/2018      Type of Therapy:  Group Therapy   Participation Level:  Did Not Attend - was invited both individually by MHT and by overhead announcement, chose not to attend.   Selmer Dominion, LCSW 08/17/2018, 1:04 PM

## 2018-08-17 NOTE — Progress Notes (Signed)
   08/17/18 0835  COVID-19 Daily Checkoff  Have you had a fever (temp > 37.80C/100F)  in the past 24 hours?  No  If you have had runny nose, nasal congestion, sneezing in the past 24 hours, has it worsened? No  COVID-19 EXPOSURE  Have you traveled outside the state in the past 14 days? No  Have you been in contact with someone with a confirmed diagnosis of COVID-19 or PUI in the past 14 days without wearing appropriate PPE? No  Have you been living in the same home as a person with confirmed diagnosis of COVID-19 or a PUI (household contact)? No  Have you been diagnosed with COVID-19? No

## 2018-08-17 NOTE — Progress Notes (Signed)
D:  Debbie Dorsey has been in her bed all evening.  She was noted just lying in the bed staring at the walls.  She was incoherent with her speech but would have moments of clarity.  She would repeat what RN was asking and did not initiate conversation.  She did take her hs medications without difficulty.  Blood sugar was 250 requiring 2 units of insulin which she took as well.  Offered snack and encouraged her to drink plenty of fluids.  She denied SI/HI or A/V hallucinations and she did not appear to be responding to internal stimuli.  She is currently resting with her eyes closed and appears to be asleep. A:  1:1 with RN for support and encouragement.  Medications as ordered.  Q 15 minute checks maintained for safety.  Encouraged participation in group and unit activities.   R:  Debbie Dorsey remains safe on the unit.  We will continue to monitor the progress towards her goals.

## 2018-08-17 NOTE — Progress Notes (Signed)
The focus of this group is to help patients establish daily goals to achieve during treatment and discuss how the patient can incorporate goal setting into their daily lives to aide in recovery. 

## 2018-08-17 NOTE — Progress Notes (Addendum)
[facility] MD Progress Note  08/17/2018 2:46 PM Debbie Dorsey  MRN:  [id number] Subjective:   Debbie Dorsey " I am alright."  Evaluation: Debbie Dorsey observed resting in bed.  She presents flat,guarded and depressed.  Patient appears to be repeating questions without direct responses.  Patient does not appear engaged throughout assessment as she continues to close her eye and repeats questions.  Chart review patient to continue Clozapine 75 mg by mouth nightly.  Denied medication side effects.  Patient is isolative to room.  Staff reported patient taking and tolerating medications well.  Support, encouragement and reassurance was provided.   Principal Problem: Schizophrenic exacerbation in general treatment resistant resulting in clozapine trial with some success thus far  diagnosis: Active Problems:   Schizophrenia ([place])  Total Time spent with patient: 20 minutes  Past Medical History:  Past Medical History:  Diagnosis Date  . Diabetes mellitus without complication (Debbie Dorsey)    History reviewed. No pertinent surgical history. Family History:  Family History  Problem Relation Age of Onset  . Mental illness Sister     Social History:  Social History   Substance and Sexual Activity  Alcohol Use No     Social History   Substance and Sexual Activity  Drug Use No    Social History   Socioeconomic History  . Marital status: Single    Spouse name: Not on file  . Number of children: Not on file  . Years of education: Not on file  . Highest education level: Not on file  Occupational History  . Not on file  Social Needs  . Financial resource strain: Not on file  . Food insecurity    Worry: Not on file    Inability: Not on file  . Transportation needs    Medical: Not on file    Non-medical: Not on file  Tobacco Use  . Smoking status: Never Smoker  . Smokeless tobacco: Never Used  Substance and Sexual Activity  . Alcohol use: No  . Drug use: No  . Sexual activity: Not Currently   Lifestyle  . Physical activity    Days per week: Not on file    Minutes per session: Not on file  . Stress: Not on file  Relationships  . Social Herbalist on phone: Not on file    Gets together: Not on file    Attends religious service: Not on file    Active member of club or organization: Not on file    Attends meetings of clubs or organizations: Not on file    Relationship status: Not on file  Other Topics Concern  . Not on file  Social History Narrative  . Not on file   Additional Social History:                         Sleep: fair  Appetite:  Fair  Current Medications: Current Facility-Administered Medications  Medication Dose Route Frequency Provider Last Rate Last Dose  . acetaminophen (TYLENOL) tablet 650 mg  650 mg Oral Q6H PRN Sharma Covert, MD      . alum & mag hydroxide-simeth (MAALOX/MYLANTA) 200-200-20 MG/5ML suspension 30 mL  30 mL Oral Q4H PRN Sharma Covert, MD      . cloZAPine (CLOZARIL) tablet 75 mg  75 mg Oral QHS Johnn Hai, MD   75 mg at 08/16/18 2208  . gabapentin (NEURONTIN) capsule 200 mg  200 mg Oral TID Myles Lipps  Kellie Simmering, MD   200 mg at 08/17/18 1213  . hydrOXYzine (ATARAX/VISTARIL) tablet 25 mg  25 mg Oral TID PRN Sharma Covert, MD   25 mg at 08/13/18 2100  . insulin aspart (novoLOG) injection 0-20 Units  0-20 Units Subcutaneous TID WC Laverle Hobby, PA-C   11 Units at 08/16/18 1747  . insulin aspart (novoLOG) injection 0-5 Units  0-5 Units Subcutaneous QHS Laverle Hobby, PA-C   2 Units at 08/16/18 2208  . insulin aspart (novoLOG) injection 6 Units  6 Units Subcutaneous TID WC Patriciaann Clan E, PA-C   6 Units at 08/17/18 1214  . insulin glargine (LANTUS) injection 22 Units  22 Units Subcutaneous Daily Sharma Covert, MD   22 Units at 08/17/18 [phone]  . magnesium hydroxide (MILK OF MAGNESIA) suspension 30 mL  30 mL Oral Daily PRN Sharma Covert, MD      . prenatal multivitamin tablet 1 tablet  1 tablet  Oral Q1200 Johnn Hai, MD   1 tablet at 08/17/18 1213  . rivaroxaban (XARELTO) tablet 20 mg  20 mg Oral Daily Johnn Hai, MD   20 mg at 08/16/18 1752  . simvastatin (ZOCOR) tablet 40 mg  40 mg Oral q1800 Sharma Covert, MD   40 mg at 08/16/18 1748  . temazepam (RESTORIL) capsule 15 mg  15 mg Oral QHS Johnn Hai, MD   15 mg at 08/16/18 2208  . traZODone (DESYREL) tablet 50 mg  50 mg Oral QHS PRN Sharma Covert, MD   50 mg at 08/14/18 2035  . ziprasidone (GEODON) injection 10 mg  10 mg Intramuscular Q6H PRN Sharma Covert, MD   10 mg at 08/12/18 4431    Lab Results:  Results for orders placed or performed during the hospital encounter of 07/31/18 (from the past 48 hour(s))  Glucose, capillary     Status: Abnormal   Collection Time: 08/15/18  5:04 PM  Result Value Ref Range   Glucose-Capillary 63 (L) 70 - 99 mg/dL   Comment 1 Notify RN    Comment 2 Document in Chart   Glucose, capillary     Status: Abnormal   Collection Time: 08/15/18  6:13 PM  Result Value Ref Range   Glucose-Capillary 154 (H) 70 - 99 mg/dL   Comment 1 Notify RN    Comment 2 Document in Chart   Glucose, capillary     Status: Abnormal   Collection Time: 08/15/18  9:20 PM  Result Value Ref Range   Glucose-Capillary 103 (H) 70 - 99 mg/dL  Glucose, capillary     Status: Abnormal   Collection Time: 08/16/18  6:27 AM  Result Value Ref Range   Glucose-Capillary 116 (H) 70 - 99 mg/dL  CBC with Differential/Platelet     Status: Abnormal   Collection Time: 08/16/18  6:58 AM  Result Value Ref Range   WBC 6.8 4.0 - 10.5 K/uL   RBC 3.76 (L) 3.87 - 5.11 MIL/uL   Hemoglobin 12.3 12.0 - 15.0 g/dL   HCT 39.4 36.0 - 46.0 %   MCV 104.8 (H) 80.0 - 100.0 fL   MCH 32.7 26.0 - 34.0 pg   MCHC 31.2 30.0 - 36.0 g/dL   RDW 14.0 11.5 - 15.5 %   Platelets 324 150 - 400 K/uL   nRBC 0.0 0.0 - 0.2 %   Neutrophils Relative % 44 %   Neutro Abs 3.0 1.7 - 7.7 K/uL   Lymphocytes Relative 45 %   Lymphs  Abs 3.1 0.7 - 4.0 K/uL    Monocytes Relative 7 %   Monocytes Absolute 0.5 0.1 - 1.0 K/uL   Eosinophils Relative 3 %   Eosinophils Absolute 0.2 0.0 - 0.5 K/uL   Basophils Relative 1 %   Basophils Absolute 0.0 0.0 - 0.1 K/uL   Immature Granulocytes 0 %   Abs Immature Granulocytes 0.03 0.00 - 0.07 K/uL    Comment: Performed at [facility], [place] [address], [place], [place] [address]  Glucose, capillary     Status: Abnormal   Collection Time: 08/16/18 11:57 AM  Result Value Ref Range   Glucose-Capillary 193 (H) 70 - 99 mg/dL  Glucose, capillary     Status: Abnormal   Collection Time: 08/16/18  5:02 PM  Result Value Ref Range   Glucose-Capillary 283 (H) 70 - 99 mg/dL   Comment 1 Notify RN    Comment 2 Document in Chart   Glucose, capillary     Status: Abnormal   Collection Time: 08/16/18  8:06 PM  Result Value Ref Range   Glucose-Capillary 250 (H) 70 - 99 mg/dL  Glucose, capillary     Status: Abnormal   Collection Time: 08/17/18  6:26 AM  Result Value Ref Range   Glucose-Capillary 110 (H) 70 - 99 mg/dL  Glucose, capillary     Status: None   Collection Time: 08/17/18 11:48 AM  Result Value Ref Range   Glucose-Capillary 91 70 - 99 mg/dL    Blood Alcohol level:  Lab Results  Component Value Date   ETH <10 07/28/2018   ETH <10 01/60/1093    Metabolic Disorder Labs: Lab Results  Component Value Date   HGBA1C 8.5 (H) 07/29/2018   MPG 197 07/29/2018   MPG 191.51 05/26/2018   No results found for: PROLACTIN No results found for: CHOL, TRIG, HDL, CHOLHDL, VLDL, LDLCALC  Physical Findings: AIMS: Facial and Oral Movements Muscles of Facial Expression: None, normal Lips and Perioral Area: None, normal Jaw: None, normal Tongue: None, normal,Extremity Movements Upper (arms, wrists, hands, fingers): None, normal Lower (legs, knees, ankles, toes): None, normal, Trunk Movements Neck, shoulders, hips: None, normal, Overall Severity Severity of abnormal movements (highest score from  questions above): None, normal Incapacitation due to abnormal movements: None, normal Patient's awareness of abnormal movements (rate only patient's report): No Awareness, Dental Status Current problems with teeth and/or dentures?: No Does patient usually wear dentures?: No  CIWA:  CIWA-Ar Total: 2 COWS:  COWS Total Score: 0  Musculoskeletal: Strength & Muscle Tone: within normal limits Gait & Station: normal Patient leans: N/A  Psychiatric Specialty Exam: Physical Exam  ROS  Blood pressure 101/83, pulse (!) 127, temperature 98.4 F (36.9 C), temperature source Oral, resp. rate 18, height 4\' 11"  (1.499 m), weight 41.3 kg, SpO2 100 %, unknown if currently breastfeeding.Body mass index is 18.38 kg/m.  General Appearance: Casual  Eye Contact:  Fair  Speech:  Slow  Volume:  Decreased  Mood:  Dysphoric  Affect:  Blunt  Thought Process:  Linear and Descriptions of Associations: Circumstantial  Orientation: Person place and situation  Thought Content:  Probable poverty of content  Suicidal Thoughts:  No  Homicidal Thoughts:  No  Memory:  Immediate;   Poor  Judgement:  Poor  Insight:  Fair  Psychomotor Activity:  Decreased  Concentration:  Concentration: Poor  Recall:  [place] of Knowledge:  Fair  Language:  Fair  Akathisia:  Negative  Handed:  Right  AIMS (if indicated):  Assets:  Physical Health Resilience  ADL's:  Intact  Cognition:  WNL  Sleep:  Number of Hours: 6.75     Treatment Plan Summary: Daily contact with patient to assess and evaluate symptoms and progress in treatment and Medication management   Continue with current treatment plan on 08/17/2018 as listed below except were noted  Schizophrenia:  Continue Clozapine 75mg  po nightly Continue gabapentin 200 mg p.o. 3 times daily Continue Restoril 15 mg p.o. nightly  CSW to continue working on discharge disposition. Patient encouraged to participate within the therapeutic milieu  [place],  NP 08/17/2018, 2:46 PM   Attest to NP Progress Note

## 2018-08-17 NOTE — Progress Notes (Signed)
D: Patient refused evening dose of insulin. She kept walking away from staff and refused to have [place] give the injection. She as nonverbal.

## 2018-08-18 LAB — GLUCOSE, CAPILLARY
Glucose-Capillary: 288 mg/dL — ABNORMAL HIGH (ref 70–99)
Glucose-Capillary: 223 mg/dL — ABNORMAL HIGH (ref 70–99)
Glucose-Capillary: 235 mg/dL — ABNORMAL HIGH (ref 70–99)
Glucose-Capillary: 57 mg/dL — ABNORMAL LOW (ref 70–99)
Glucose-Capillary: 57 mg/dL — ABNORMAL LOW (ref 70–99)
Glucose-Capillary: 90 mg/dL (ref 70–99)

## 2018-08-18 MED ORDER — CLOZAPINE 100 MG PO TABS
100.0000 mg | ORAL_TABLET | Freq: Every day | ORAL | Status: DC
  Administered 2018-08-18: 100 mg via ORAL
  Filled 2018-08-18 (×2): qty 1

## 2018-08-18 MED ORDER — GLUCOSE 40 % PO GEL
ORAL | Status: AC
  Administered 2018-08-18: 21:00:00
  Filled 2018-08-18: qty 1

## 2018-08-18 NOTE — Progress Notes (Signed)
Nursing 1:1 note D:Pt observed sleeping in bed with eyes closed. RR even and unlabored. No distress noted. A: 1:1 observation continues for safety  R: pt remains safe  

## 2018-08-18 NOTE — Progress Notes (Signed)
Hypoglycemic Event  CBG:   Treatment: 4 oz juice/soda , graham crackers  Symptoms: None  Follow-up CBG: Time:2039 CBG Result:57   Treatment: Ensure, tube Clucose   Follow-up CBG: Time:2102 CBG Result:90  Possible Reasons for Event: Inadequate meal intake  Comments/MD notified: continue to monitor    [place]

## 2018-08-18 NOTE — Progress Notes (Signed)
   08/18/18 0813  COVID-19 Daily Checkoff  Have you had a fever (temp > 37.80C/100F)  in the past 24 hours?  No  If you have had runny nose, nasal congestion, sneezing in the past 24 hours, has it worsened? No  COVID-19 EXPOSURE  Have you traveled outside the state in the past 14 days? No  Have you been in contact with someone with a confirmed diagnosis of COVID-19 or PUI in the past 14 days without wearing appropriate PPE? No  Have you been living in the same home as a person with confirmed diagnosis of COVID-19 or a PUI (household contact)? No  Have you been diagnosed with COVID-19? No

## 2018-08-18 NOTE — Progress Notes (Addendum)
D: Patient is still selectively mute, bizarre, distant/remote. She is incoherent and difficult to understand. She did eat dinner according to her safety attendant. She is sitting in bed, awake and alert. A: Encouraged PO fluids. 1:1 maintained. R: Patient verbalized understanding.

## 2018-08-18 NOTE — Progress Notes (Signed)
D: Patient was reportedly wandering into other patients' rooms, attempting to elope, not eating or drinking last night. Yesterday she refused her afternoon insulin dose. She is mostly non-verbal. Bizarre behavior. Myrle Sheng, NP recommended we start a 1:1 on her to monitor and for patient safety.  A: 1:1 initiated. R: Patient remains nonverbal, but compliant.

## 2018-08-18 NOTE — Progress Notes (Signed)
D: Patient is bizarre, selectively mute. She has not been sleeping during the night, she is refusing food, and not drinking many fluids. She was fearful of staff yesterday, and still presents paranoid. She was placed on 1:1 observation by provider, and she is cooperative with 1:1 so far. She is difficult to assess, as all she was able to state was date of birth. A: Provided support and encouragement. Educated on medications. Fluids given. Encouraged attendance in group therapy sessions.

## 2018-08-18 NOTE — Progress Notes (Signed)
D: Pt denies SI/HI/AVH. Pt is pleasant and cooperative. Pt continues to be bizarre at times. Pt needing much encouragement to take medications and foods / drink to bring up CBG.   A: Pt was offered support and encouragement. Pt was given scheduled medications. Pt was encourage to attend groups. Q 15 minute checks were done for safety.   R: safety maintained on unit.

## 2018-08-18 NOTE — Progress Notes (Signed)
D: Patient seen standing at nurse's station with 1:1 attendant. She smiled appropriately a few times, but remains mute. She is standing with a steady gait and in no acute distress. Respirations even and unlabored.  A: Continue 1:1 for elopement risk and continued redirection.

## 2018-08-18 NOTE — Progress Notes (Addendum)
[facility] MD Progress Note  08/18/2018 10:46 AM Debbie Dorsey  MRN:  [id number]    Evaluation: Blanka presented with thought blocking and disorganized thoughts.  Patient needs constant redirection.  As per staff patient attempted to elope on yesterday.  Stated patient appears very paranoid and irritable in the evenings.  Reported decreased sleep on yesterday.  Requesting need for one-to-one supervision.  Reported taking and tolerating medications.  NP will collaborate with attending psychiatrist regarding medication management.  Patient was provided with a agitation protocol.  Staff to continue to monitor for safety. support, encouragement and  reassurance was provided.  Discussed with attending psychiatrist titration with Clozpine 75mg  to 100 mg.QHS.    Principal Problem: Schizophrenic exacerbation in general treatment resistant resulting in clozapine trial with some success thus far  diagnosis: Active Problems:   Schizophrenia ([place])  Total Time spent with patient: 20 minutes  Past Medical History:  Past Medical History:  Diagnosis Date  . Diabetes mellitus without complication ([place])    History reviewed. No pertinent surgical history. Family History:  Family History  Problem Relation Age of Onset  . Mental illness Sister     Social History:  Social History   Substance and Sexual Activity  Alcohol Use No     Social History   Substance and Sexual Activity  Drug Use No    Social History   Socioeconomic History  . Marital status: Single    Spouse name: Not on file  . Number of children: Not on file  . Years of education: Not on file  . Highest education level: Not on file  Occupational History  . Not on file  Social Needs  . Financial resource strain: Not on file  . Food insecurity    Worry: Not on file    Inability: Not on file  . Transportation needs    Medical: Not on file    Non-medical: Not on file  Tobacco Use  . Smoking status: Never Smoker  . Smokeless  tobacco: Never Used  Substance and Sexual Activity  . Alcohol use: No  . Drug use: No  . Sexual activity: Not Currently  Lifestyle  . Physical activity    Days per week: Not on file    Minutes per session: Not on file  . Stress: Not on file  Relationships  . Social Herbalist on phone: Not on file    Gets together: Not on file    Attends religious service: Not on file    Active member of club or organization: Not on file    Attends meetings of clubs or organizations: Not on file    Relationship status: Not on file  Other Topics Concern  . Not on file  Social History Narrative  . Not on file   Additional Social History:                         Sleep: fair  Appetite:  Fair  Current Medications: Current Facility-Administered Medications  Medication Dose Route Frequency Provider Last Rate Last Dose  . acetaminophen (TYLENOL) tablet 650 mg  650 mg Oral Q6H PRN Sharma Covert, MD      . alum & mag hydroxide-simeth (MAALOX/MYLANTA) 200-200-20 MG/5ML suspension 30 mL  30 mL Oral Q4H PRN Sharma Covert, MD      . cloZAPine (CLOZARIL) tablet 75 mg  75 mg Oral QHS Johnn Hai, MD   75 mg at 08/17/18 2138  .  gabapentin (NEURONTIN) capsule 200 mg  200 mg Oral TID Sharma Covert, MD   200 mg at 08/18/18 0813  . hydrOXYzine (ATARAX/VISTARIL) tablet 25 mg  25 mg Oral TID PRN Sharma Covert, MD   25 mg at 08/13/18 2100  . insulin aspart (novoLOG) injection 0-20 Units  0-20 Units Subcutaneous TID WC Laverle Hobby, PA-C   11 Units at 08/18/18 4081  . insulin aspart (novoLOG) injection 0-5 Units  0-5 Units Subcutaneous QHS Laverle Hobby, PA-C   3 Units at 08/17/18 2140  . insulin aspart (novoLOG) injection 6 Units  6 Units Subcutaneous TID WC Patriciaann Clan E, PA-C   6 Units at 08/18/18 4481  . insulin glargine (LANTUS) injection 22 Units  22 Units Subcutaneous Daily Sharma Covert, MD   22 Units at 08/18/18 0813  . magnesium hydroxide (MILK OF  MAGNESIA) suspension 30 mL  30 mL Oral Daily PRN Sharma Covert, MD      . prenatal multivitamin tablet 1 tablet  1 tablet Oral Q1200 Johnn Hai, MD   1 tablet at 08/17/18 1213  . rivaroxaban (XARELTO) tablet 20 mg  20 mg Oral Daily Johnn Hai, MD   20 mg at 08/17/18 1706  . simvastatin (ZOCOR) tablet 40 mg  40 mg Oral q1800 Sharma Covert, MD   40 mg at 08/17/18 1706  . temazepam (RESTORIL) capsule 15 mg  15 mg Oral QHS Johnn Hai, MD   15 mg at 08/17/18 2138  . traZODone (DESYREL) tablet 50 mg  50 mg Oral QHS PRN Sharma Covert, MD   50 mg at 08/18/18 0014  . ziprasidone (GEODON) injection 10 mg  10 mg Intramuscular Q6H PRN Sharma Covert, MD   10 mg at 08/12/18 8563    Lab Results:  Results for orders placed or performed during the hospital encounter of 07/31/18 (from the past 48 hour(s))  Glucose, capillary     Status: Abnormal   Collection Time: 08/16/18 11:57 AM  Result Value Ref Range   Glucose-Capillary 193 (H) 70 - 99 mg/dL  Glucose, capillary     Status: Abnormal   Collection Time: 08/16/18  5:02 PM  Result Value Ref Range   Glucose-Capillary 283 (H) 70 - 99 mg/dL   Comment 1 Notify RN    Comment 2 Document in Chart   Glucose, capillary     Status: Abnormal   Collection Time: 08/16/18  8:06 PM  Result Value Ref Range   Glucose-Capillary 250 (H) 70 - 99 mg/dL  Glucose, capillary     Status: Abnormal   Collection Time: 08/17/18  6:26 AM  Result Value Ref Range   Glucose-Capillary 110 (H) 70 - 99 mg/dL  Glucose, capillary     Status: None   Collection Time: 08/17/18 11:48 AM  Result Value Ref Range   Glucose-Capillary 91 70 - 99 mg/dL  Glucose, capillary     Status: Abnormal   Collection Time: 08/17/18  4:29 PM  Result Value Ref Range   Glucose-Capillary 182 (H) 70 - 99 mg/dL  Glucose, capillary     Status: Abnormal   Collection Time: 08/17/18  8:17 PM  Result Value Ref Range   Glucose-Capillary 264 (H) 70 - 99 mg/dL  Glucose, capillary      Status: Abnormal   Collection Time: 08/18/18  5:58 AM  Result Value Ref Range   Glucose-Capillary 288 (H) 70 - 99 mg/dL    Blood Alcohol level:  Lab Results  Component  Value Date   ETH <10 07/28/2018   ETH <10 25/63/8937    Metabolic Disorder Labs: Lab Results  Component Value Date   HGBA1C 8.5 (H) 07/29/2018   MPG 197 07/29/2018   MPG 191.51 05/26/2018   No results found for: PROLACTIN No results found for: CHOL, TRIG, HDL, CHOLHDL, VLDL, LDLCALC  Physical Findings: AIMS: Facial and Oral Movements Muscles of Facial Expression: None, normal Lips and Perioral Area: None, normal Jaw: None, normal Tongue: None, normal,Extremity Movements Upper (arms, wrists, hands, fingers): None, normal Lower (legs, knees, ankles, toes): None, normal, Trunk Movements Neck, shoulders, hips: None, normal, Overall Severity Severity of abnormal movements (highest score from questions above): None, normal Incapacitation due to abnormal movements: None, normal Patient's awareness of abnormal movements (rate only patient's report): No Awareness, Dental Status Current problems with teeth and/or dentures?: No Does patient usually wear dentures?: No  CIWA:  CIWA-Ar Total: 2 COWS:  COWS Total Score: 0  Musculoskeletal: Strength & Muscle Tone: within normal limits Gait & Station: normal Patient leans: N/A  Psychiatric Specialty Exam: Physical Exam  Nursing note and vitals reviewed. Constitutional: She appears well-developed.    ROS  Blood pressure 91/61, pulse (!) 126, temperature 97.8 F (36.6 C), temperature source Oral, resp. rate 18, height 4\' 11"  (1.499 m), weight 41.3 kg, SpO2 100 %, unknown if currently breastfeeding.Body mass index is 18.38 kg/m.  General Appearance: Casual  Eye Contact:  Fair  Speech:  Slow  Volume:  Decreased  Mood:  Dysphoric  Affect:  Blunt  Thought Process:  Linear and Descriptions of Associations: Circumstantial  Orientation: Person place and situation   Thought Content:  Probable poverty of content  Suicidal Thoughts:  No  Homicidal Thoughts:  No  Memory:  Immediate;   Poor  Judgement:  Poor  Insight:  Fair  Psychomotor Activity:  Decreased  Concentration:  Concentration: Poor  Recall:  [place] of Knowledge:  Fair  Language:  Fair  Akathisia:  Negative  Handed:  Right  AIMS (if indicated):     Assets:  Physical Health Resilience  ADL's:  Intact  Cognition:  WNL  Sleep:  Number of Hours: 0     Treatment Plan Summary: Daily contact with patient to assess and evaluate symptoms and progress in treatment and Medication management   Continue with current treatment plan on 08/18/2018 as listed below except were noted  Schizophrenia:  Increased  Clozapine 75 mg to 100mg   po nightly Continue gabapentin 200 mg p.o. 3 times daily Continue Restoril 15 mg p.o. nightly  CSW to continue working on discharge disposition. Patient encouraged to participate within the therapeutic milieu  Derrill Center, NP 08/18/2018, 10:46 AM Attest to NP Progress Note

## 2018-08-18 NOTE — [facility] Group Notes (Signed)
[facility] Group Notes: (Clinical Social Work)   08/18/2018      Type of Therapy:  Group Therapy   Participation Level:  Did Not Attend - was invited both individually by MHT and by overhead announcement, chose not to attend.   Selmer Dominion, LCSW 08/18/2018, 11:36 AM

## 2018-08-18 NOTE — Progress Notes (Signed)
DAR NOTE: Patient presents with anxious affect and restless mood. Pt has been pacing the whole way, has been requiring a lot of redirection. Pt could not not go to sleep even after taking sleep medication. Pt slept most of the day. Pt also requires a lot of encouragement with her meals. Denies pain, auditory and visual hallucinations.. Maintained on routine safety checks.  Medications given as prescribed.  Support and encouragement offered as needed.Will continue to monitor.

## 2018-08-18 NOTE — Progress Notes (Signed)
D: Patient is still bizarre. Observed watching TV in dayroom. She is selectively mute. Smiling inappropriately. Appears to be responding to internal stimuli. Still has not slept. She can't verbalize her needs accurately. She is eating meals: breakfast and lunch today. That is an improvement from yesterday. She requires frequent redirection, but is fairly cooperative.  A: 1:1 maintained.

## 2018-08-19 DIAGNOSIS — R4189 Other symptoms and signs involving cognitive functions and awareness: Secondary | ICD-10-CM

## 2018-08-19 LAB — GLUCOSE, CAPILLARY
Glucose-Capillary: 229 mg/dL — ABNORMAL HIGH (ref 70–99)
Glucose-Capillary: 225 mg/dL — ABNORMAL HIGH (ref 70–99)
Glucose-Capillary: 129 mg/dL — ABNORMAL HIGH (ref 70–99)
Glucose-Capillary: 83 mg/dL (ref 70–99)

## 2018-08-19 MED ORDER — PERPHENAZINE 2 MG PO TABS
2.0000 mg | ORAL_TABLET | Freq: Two times a day (BID) | ORAL | Status: DC
  Administered 2018-08-19 – 2018-08-26 (×14): 2 mg via ORAL
  Filled 2018-08-19 (×16): qty 1

## 2018-08-19 MED ORDER — CLOZAPINE 25 MG PO TABS
75.0000 mg | ORAL_TABLET | Freq: Every day | ORAL | Status: DC
  Administered 2018-08-19 – 2018-08-25 (×7): 75 mg via ORAL
  Filled 2018-08-19 (×8): qty 3

## 2018-08-19 MED ORDER — ENSURE ENLIVE PO LIQD
237.0000 mL | Freq: Three times a day (TID) | ORAL | Status: DC
  Administered 2018-08-19 – 2018-08-20 (×3): 237 mL via ORAL

## 2018-08-19 NOTE — Progress Notes (Signed)
Due to pt not eating, pt has been encouraged to drink ensures

## 2018-08-19 NOTE — Progress Notes (Signed)
Pt observed sitting in the dayroom at this time and is calm and cooperative. Pt remains on close observation for safety.

## 2018-08-19 NOTE — Progress Notes (Signed)
Observation note: Pt observed sitting in the dayroom watching t.v with sitter present. Pt noted to be calm and cooperative. Pt remains on 1:1 observation for safety.

## 2018-08-19 NOTE — Progress Notes (Signed)
Observation note: pt remains confused. Pt continues to mimic staff movements and gestures. Pt wanders the unit and requires redirecting and reorienting. Pt consumed 60% of her breakfast and 240 cc of liquids. Pt remains on close observation for safety.

## 2018-08-19 NOTE — Progress Notes (Signed)
Initial Nutrition Assessment  RD working remotely.   DOCUMENTATION CODES:   (unable to assess for malnutrition at this time.)  INTERVENTION:  - will order Ensure Enlive TID, each supplement provides 350 kcal and 20 grams of protein - continue to encourage PO intakes. - recommend liberalize diet from Carb Modified to Regular.    NUTRITION DIAGNOSIS:   Inadequate oral intake related to acute illness, decreased appetite as evidenced by (RN report of patient not eating well).  GOAL:   Patient will meet greater than or equal to 90% of their needs  MONITOR:   PO intake, Supplement acceptance, Labs, Weight trends  REASON FOR ASSESSMENT:   Consult Assessment of nutrition requirement/status  ASSESSMENT:   48 year-old female with medical history of DM and schizophrenia admitted for schizophrenia.  Per chart review, current weight is 91 lb and weight on 4/23 was 95 lb. This indicates 4 lb weight loss (4.2% body weight) in the past 3 months; not significant for time frame. Another RD saw patient remotely during admission in April but note from that time indicates that patient was disoriented and unable to provide information. Weight on 3/14 was 100 lb which indicates 9 lb weight loss (9% body weight) in the past 4 months; significant for time frame.   Unable to talk with patient as patients do not have phones in their rooms and RD working remotely. RN notes have indicated that patient is not eating well/eating minimally.  Medications reviewed; sliding scale novolog, 6 units novolog TID, 22 units lantus/day, prenatal multivitamin/day.  Labs reviewed; CBGs: 90 and 229 mg/dl today.       NUTRITION - FOCUSED PHYSICAL EXAM:  unable to complete at this time.   Diet Order:   Diet Order            Diet Carb Modified Fluid consistency: Thin; Room service appropriate? Yes  Diet effective now              EDUCATION NEEDS:   Not appropriate for education at this time  Skin:   Skin Assessment: Reviewed RN Assessment  Last BM:  PTA/unknown  Height:   Ht Readings from Last 1 Encounters:  07/31/18 4\' 11"  (1.499 m)    Weight:   Wt Readings from Last 1 Encounters:  07/31/18 41.3 kg    Ideal Body Weight:  42.9 kg  BMI:  Body mass index is 18.38 kg/m.  Estimated Nutritional Needs:   Kcal:  1365-1530 (33-37 kcal/kg)  Protein:  55-65 grams  Fluid:  >/= 1.5 L/day      Jarome Matin, MS, RD, LDN, [facility] Inpatient Clinical Dietitian Pager # [phone] After hours/weekend pager # [phone]

## 2018-08-19 NOTE — Progress Notes (Signed)
Inpatient Diabetes Program Recommendations  AACE/ADA: New Consensus Statement on Inpatient Glycemic Control (2015)  Target Ranges:  Prepandial:   less than 140 mg/dL      Peak postprandial:   less than 180 mg/dL (1-2 hours)      Critically ill patients:  140 - 180 mg/dL   Lab Results  Component Value Date   GLUCAP 229 (H) 08/19/2018   HGBA1C 8.5 (H) 07/29/2018    Review of Glycemic Control  Post-prandials - couple of lows (mild) Have also been > 180 mg/dL. May benefit to decrease Novolog to 5 units tidwc for meal coverage.  Inpatient Diabetes Program Recommendations:     Decrease Novolog to 5 units tidwc for meal coverage insulin.  Will follow trends with CBGs.  Thank you. Lorenda Peck, RD, LDN, CDE Inpatient Diabetes Coordinator [phone]

## 2018-08-19 NOTE — Progress Notes (Signed)
Nursing 1:1 note D:Pt observed sleeping in bed with eyes closed. RR even and unlabored. No distress noted. A: 1:1 observation continues for safety  R: pt remains safe  

## 2018-08-19 NOTE — Tx Team (Signed)
Interdisciplinary Treatment and Diagnostic Plan Update  08/19/2018 Time of Session: 09:14am Debbie Dorsey MRN: [id number]  Principal Diagnosis: <principal problem not specified>  Secondary Diagnoses: Active Problems:   Schizophrenia ([place])   Current Medications:  Current Facility-Administered Medications  Medication Dose Route Frequency Provider Last Rate Last Dose  . acetaminophen (TYLENOL) tablet 650 mg  650 mg Oral Q6H PRN Sharma Covert, MD      . alum & mag hydroxide-simeth (MAALOX/MYLANTA) 200-200-20 MG/5ML suspension 30 mL  30 mL Oral Q4H PRN Sharma Covert, MD      . cloZAPine (CLOZARIL) tablet 100 mg  100 mg Oral QHS Derrill Center, NP   100 mg at 08/18/18 2108  . gabapentin (NEURONTIN) capsule 200 mg  200 mg Oral TID Sharma Covert, MD   200 mg at 08/19/18 0347  . hydrOXYzine (ATARAX/VISTARIL) tablet 25 mg  25 mg Oral TID PRN Sharma Covert, MD   25 mg at 08/18/18 2108  . insulin aspart (novoLOG) injection 0-20 Units  0-20 Units Subcutaneous TID WC Patriciaann Clan E, PA-C   7 Units at 08/19/18 4259  . insulin aspart (novoLOG) injection 0-5 Units  0-5 Units Subcutaneous QHS Laverle Hobby, PA-C   3 Units at 08/17/18 2140  . insulin aspart (novoLOG) injection 6 Units  6 Units Subcutaneous TID WC Patriciaann Clan E, PA-C   6 Units at 08/19/18 5638  . insulin glargine (LANTUS) injection 22 Units  22 Units Subcutaneous Daily Sharma Covert, MD   22 Units at 08/19/18 [phone]  . magnesium hydroxide (MILK OF MAGNESIA) suspension 30 mL  30 mL Oral Daily PRN Sharma Covert, MD      . prenatal multivitamin tablet 1 tablet  1 tablet Oral Q1200 Johnn Hai, MD   1 tablet at 08/18/18 1157  . rivaroxaban (XARELTO) tablet 20 mg  20 mg Oral Daily Johnn Hai, MD   20 mg at 08/18/18 1706  . simvastatin (ZOCOR) tablet 40 mg  40 mg Oral q1800 Sharma Covert, MD   40 mg at 08/18/18 1705  . temazepam (RESTORIL) capsule 15 mg  15 mg Oral QHS Johnn Hai, MD   15 mg at  08/18/18 2108  . traZODone (DESYREL) tablet 50 mg  50 mg Oral QHS PRN Sharma Covert, MD   50 mg at 08/18/18 2108  . ziprasidone (GEODON) injection 10 mg  10 mg Intramuscular Q6H PRN Sharma Covert, MD   10 mg at 08/12/18 3329   PTA Medications: Medications Prior to Admission  Medication Sig Dispense Refill Last Dose  . benztropine (COGENTIN) 0.5 MG tablet Take 1 tablet (0.5 mg total) by mouth 2 (two) times daily. Restart on 06/07/2018 as per psychiatry (Patient not taking: Reported on 07/29/2018)     . benztropine (COGENTIN) 1 MG tablet Take 0.5 mg by mouth 2 (two) times a day.     . busPIRone (BUSPAR) 5 MG tablet Take 5 mg by mouth 3 (three) times daily.     . insulin NPH-regular Human (70-30) 100 UNIT/ML injection Inject 32 Units into the skin 2 (two) times daily with a meal.     . risperiDONE (RISPERDAL) 0.5 MG tablet Take 1 tablet (0.5 mg total) by mouth 2 (two) times daily. Start on 06/07/2018 as per psychiatry (Patient not taking: Reported on 07/29/2018) 30 tablet 0   . Rivaroxaban (XARELTO) 15 MG TABS tablet Take 15 mg by mouth 2 (two) times daily with a meal.     .  Rivaroxaban 15 & 20 MG TBPK Take as directed on package: Start with one 15mg  tablet by mouth twice a day with food. On Day 22, switch to one 20mg  tablet once a day with food. (Patient not taking: Reported on 07/29/2018) 51 each 0   . simvastatin (ZOCOR) 40 MG tablet Take 40 mg by mouth daily at 12 noon.      . temazepam (RESTORIL) 15 MG capsule Take 1 capsule (15 mg total) by mouth at bedtime as needed for sleep. (Patient not taking: Reported on 07/29/2018) 30 capsule 0   . traZODone (DESYREL) 100 MG tablet Take 100 mg by mouth daily.       Patient Stressors: Health problems Medication change or noncompliance Traumatic event  Patient Strengths: Supportive family/friends  Treatment Modalities: Medication Management, Group therapy, Case management,  1 to 1 session with clinician, Psychoeducation, Recreational  therapy.   Physician Treatment Plan for Primary Diagnosis: <principal problem not specified> Long Term Goal(s): Improvement in symptoms so as ready for discharge Improvement in symptoms so as ready for discharge   Short Term Goals: Ability to identify changes in lifestyle to reduce recurrence of condition will improve Ability to verbalize feelings will improve Ability to disclose and discuss suicidal ideas Ability to identify and develop effective coping behaviors will improve Ability to maintain clinical measurements within normal limits will improve Compliance with prescribed medications will improve  Medication Management: Evaluate patient's response, side effects, and tolerance of medication regimen.  Therapeutic Interventions: 1 to 1 sessions, Unit Group sessions and Medication administration.  Evaluation of Outcomes: Progressing  Physician Treatment Plan for Secondary Diagnosis: Active Problems:   Schizophrenia ([place])  Long Term Goal(s): Improvement in symptoms so as ready for discharge Improvement in symptoms so as ready for discharge   Short Term Goals: Ability to identify changes in lifestyle to reduce recurrence of condition will improve Ability to verbalize feelings will improve Ability to disclose and discuss suicidal ideas Ability to identify and develop effective coping behaviors will improve Ability to maintain clinical measurements within normal limits will improve Compliance with prescribed medications will improve     Medication Management: Evaluate patient's response, side effects, and tolerance of medication regimen.  Therapeutic Interventions: 1 to 1 sessions, Unit Group sessions and Medication administration.  Evaluation of Outcomes: Progressing   RN Treatment Plan for Primary Diagnosis: <principal problem not specified> Long Term Goal(s): Knowledge of disease and therapeutic regimen to maintain health will improve  Short Term Goals: Ability to  participate in decision making will improve, Ability to verbalize feelings will improve, Ability to disclose and discuss suicidal ideas, Ability to identify and develop effective coping behaviors will improve and Compliance with prescribed medications will improve  Medication Management: RN will administer medications as ordered by provider, will assess and evaluate patient's response and provide education to patient for prescribed medication. RN will report any adverse and/or side effects to prescribing provider.  Therapeutic Interventions: 1 on 1 counseling sessions, Psychoeducation, Medication administration, Evaluate responses to treatment, Monitor vital signs and CBGs as ordered, Perform/monitor CIWA, COWS, AIMS and Fall Risk screenings as ordered, Perform wound care treatments as ordered.  Evaluation of Outcomes: Progressing   LCSW Treatment Plan for Primary Diagnosis: <principal problem not specified> Long Term Goal(s): Safe transition to appropriate next level of care at discharge, Engage patient in therapeutic group addressing interpersonal concerns.  Short Term Goals: Engage patient in aftercare planning with referrals and resources and Increase skills for wellness and recovery  Therapeutic Interventions: Assess for  all discharge needs, 1 to 1 time with Education officer, museum, Explore available resources and support systems, Assess for adequacy in community support network, Educate family and significant other(s) on suicide prevention, Complete Psychosocial Assessment, Interpersonal group therapy.  Evaluation of Outcomes: Progressing   Progress in Treatment: Attending groups: No. Participating in groups: No. Taking medication as prescribed: Yes. Toleration medication: Yes. Family/Significant other contact made: Yes, individual(s) contacted:  pt's grandfather Patient understands diagnosis: No. Discussing patient identified problems/goals with staff: No. Medical problems stabilized or  resolved: Yes. Denies suicidal/homicidal ideation: Yes. Issues/concerns per patient self-inventory: No. Other:   New problem(s) identified: No, Describe:  None  New Short Term/Long Term Goal(s): Medication stabilization, elimination of SI thoughts, and development of a comprehensive mental wellness plan.   Patient Goals:    Discharge Plan or Barriers: CSW will continue to follow up for appropriate referrals and possible discharge planning. ACTT with [facility].  Reason for Continuation of Hospitalization: Delusions  Medication stabilization  Estimated Length of Stay: 2-3 days  Attendees: Patient: 08/19/2018   Physician: Dr. Johnn Hai, MD 08/19/2018  Nursing: Sharl Ma, RN  08/19/2018   RN Care Manager: 08/19/2018  Social Worker: Ardelle Anton, LCSW 08/19/2018   Recreational Therapist:  08/19/2018   Other:  08/19/2018   Other:  08/19/2018   Other: 08/19/2018     Scribe for Treatment Team: Trecia Rogers, LCSW 08/19/2018 10:11 AM

## 2018-08-19 NOTE — Progress Notes (Signed)
[facility] MD Progress Note  08/19/2018 1:26 PM Debbie Dorsey  MRN:  [id number] Subjective:   Mental status has plateaued a bit she is alert she is oriented to person place situation but still whispers sentence fragments that are disjointed but denies current thoughts of harming self or others and can contract while here seems to understand what that means at any rate she denies any hallucinations but is still disorganized trying to elope whenever she can and does need one-to-one precautions Principal Problem: Disorganization in the context of a chronic schizophrenic condition no longer responsive to Risperdal therapy Diagnosis: Active Problems:   Schizophrenia ([place])  Total Time spent with patient: 20 minutes  Past Medical History:  Past Medical History:  Diagnosis Date  . Diabetes mellitus without complication ([place])    History reviewed. No pertinent surgical history. Family History:  Family History  Problem Relation Age of Onset  . Mental illness Sister    Family Psychiatric  History: no new data Social History:  Social History   Substance and Sexual Activity  Alcohol Use No     Social History   Substance and Sexual Activity  Drug Use No    Social History   Socioeconomic History  . Marital status: Single    Spouse name: Not on file  . Number of children: Not on file  . Years of education: Not on file  . Highest education level: Not on file  Occupational History  . Not on file  Social Needs  . Financial resource strain: Not on file  . Food insecurity    Worry: Not on file    Inability: Not on file  . Transportation needs    Medical: Not on file    Non-medical: Not on file  Tobacco Use  . Smoking status: Never Smoker  . Smokeless tobacco: Never Used  Substance and Sexual Activity  . Alcohol use: No  . Drug use: No  . Sexual activity: Not Currently  Lifestyle  . Physical activity    Days per week: Not on file    Minutes per session: Not on file  . Stress: Not  on file  Relationships  . Social Herbalist on phone: Not on file    Gets together: Not on file    Attends religious service: Not on file    Active member of club or organization: Not on file    Attends meetings of clubs or organizations: Not on file    Relationship status: Not on file  Other Topics Concern  . Not on file  Social History Narrative  . Not on file   Additional Social History:                         Sleep: Good  Appetite:  Fair  Current Medications: Current Facility-Administered Medications  Medication Dose Route Frequency Provider Last Rate Last Dose  . acetaminophen (TYLENOL) tablet 650 mg  650 mg Oral Q6H PRN Sharma Covert, MD      . alum & mag hydroxide-simeth (MAALOX/MYLANTA) 200-200-20 MG/5ML suspension 30 mL  30 mL Oral Q4H PRN Sharma Covert, MD      . cloZAPine (CLOZARIL) tablet 100 mg  100 mg Oral QHS Derrill Center, NP   100 mg at 08/18/18 2108  . feeding supplement (ENSURE ENLIVE) (ENSURE ENLIVE) liquid 237 mL  237 mL Oral TID BM Johnn Hai, MD      . gabapentin (NEURONTIN) capsule  200 mg  200 mg Oral TID Sharma Covert, MD   200 mg at 08/19/18 1251  . hydrOXYzine (ATARAX/VISTARIL) tablet 25 mg  25 mg Oral TID PRN Sharma Covert, MD   25 mg at 08/18/18 2108  . insulin aspart (novoLOG) injection 0-20 Units  0-20 Units Subcutaneous TID WC Patriciaann Clan E, PA-C   7 Units at 08/19/18 1250  . insulin aspart (novoLOG) injection 0-5 Units  0-5 Units Subcutaneous QHS Laverle Hobby, PA-C   3 Units at 08/17/18 2140  . insulin aspart (novoLOG) injection 6 Units  6 Units Subcutaneous TID WC Patriciaann Clan E, PA-C   6 Units at 08/19/18 1252  . insulin glargine (LANTUS) injection 22 Units  22 Units Subcutaneous Daily Sharma Covert, MD   22 Units at 08/19/18 [phone]  . magnesium hydroxide (MILK OF MAGNESIA) suspension 30 mL  30 mL Oral Daily PRN Sharma Covert, MD      . prenatal multivitamin tablet 1 tablet  1 tablet  Oral Q1200 Johnn Hai, MD   1 tablet at 08/19/18 1251  . rivaroxaban (XARELTO) tablet 20 mg  20 mg Oral Daily Johnn Hai, MD   20 mg at 08/18/18 1706  . simvastatin (ZOCOR) tablet 40 mg  40 mg Oral q1800 Sharma Covert, MD   40 mg at 08/18/18 1705  . temazepam (RESTORIL) capsule 15 mg  15 mg Oral QHS Johnn Hai, MD   15 mg at 08/18/18 2108  . traZODone (DESYREL) tablet 50 mg  50 mg Oral QHS PRN Sharma Covert, MD   50 mg at 08/18/18 2108  . ziprasidone (GEODON) injection 10 mg  10 mg Intramuscular Q6H PRN Sharma Covert, MD   10 mg at 08/12/18 1191    Lab Results:  Results for orders placed or performed during the hospital encounter of 07/31/18 (from the past 48 hour(s))  Glucose, capillary     Status: Abnormal   Collection Time: 08/17/18  4:29 PM  Result Value Ref Range   Glucose-Capillary 182 (H) 70 - 99 mg/dL  Glucose, capillary     Status: Abnormal   Collection Time: 08/17/18  8:17 PM  Result Value Ref Range   Glucose-Capillary 264 (H) 70 - 99 mg/dL  Glucose, capillary     Status: Abnormal   Collection Time: 08/18/18  5:58 AM  Result Value Ref Range   Glucose-Capillary 288 (H) 70 - 99 mg/dL  Glucose, capillary     Status: Abnormal   Collection Time: 08/18/18 12:00 PM  Result Value Ref Range   Glucose-Capillary 223 (H) 70 - 99 mg/dL   Comment 1 Notify RN   Glucose, capillary     Status: Abnormal   Collection Time: 08/18/18  4:31 PM  Result Value Ref Range   Glucose-Capillary 235 (H) 70 - 99 mg/dL   Comment 1 Notify RN    Comment 2 Document in Chart   Glucose, capillary     Status: Abnormal   Collection Time: 08/18/18  8:23 PM  Result Value Ref Range   Glucose-Capillary 57 (L) 70 - 99 mg/dL  Glucose, capillary     Status: Abnormal   Collection Time: 08/18/18  8:39 PM  Result Value Ref Range   Glucose-Capillary 57 (L) 70 - 99 mg/dL   Comment 1 Notify RN   Glucose, capillary     Status: None   Collection Time: 08/18/18  9:02 PM  Result Value Ref Range    Glucose-Capillary 90 70 - 99  mg/dL  Glucose, capillary     Status: Abnormal   Collection Time: 08/19/18  6:18 AM  Result Value Ref Range   Glucose-Capillary 229 (H) 70 - 99 mg/dL  Glucose, capillary     Status: Abnormal   Collection Time: 08/19/18 11:34 AM  Result Value Ref Range   Glucose-Capillary 225 (H) 70 - 99 mg/dL   Comment 1 Notify RN    Comment 2 Document in Chart     Blood Alcohol level:  Lab Results  Component Value Date   ETH <10 07/28/2018   ETH <10 16/38/4536    Metabolic Disorder Labs: Lab Results  Component Value Date   HGBA1C 8.5 (H) 07/29/2018   MPG 197 07/29/2018   MPG 191.51 05/26/2018   No results found for: PROLACTIN No results found for: CHOL, TRIG, HDL, CHOLHDL, VLDL, LDLCALC  Physical Findings: AIMS: Facial and Oral Movements Muscles of Facial Expression: None, normal Lips and Perioral Area: None, normal Jaw: None, normal Tongue: None, normal,Extremity Movements Upper (arms, wrists, hands, fingers): None, normal Lower (legs, knees, ankles, toes): None, normal, Trunk Movements Neck, shoulders, hips: None, normal, Overall Severity Severity of abnormal movements (highest score from questions above): None, normal Incapacitation due to abnormal movements: None, normal Patient's awareness of abnormal movements (rate only patient's report): No Awareness, Dental Status Current problems with teeth and/or dentures?: No Does patient usually wear dentures?: No  CIWA:  CIWA-Ar Total: 2 COWS:  COWS Total Score: 0  Musculoskeletal: Strength & Muscle Tone: within normal limits Gait & Station: unsteady Patient leans: N/A  Psychiatric Specialty Exam: Physical Exam  ROS  Blood pressure 113/79, pulse (!) 133, temperature 98.4 F (36.9 C), temperature source Oral, resp. rate 18, height 4\' 11"  (1.499 m), weight 41.3 kg, SpO2 100 %, unknown if currently breastfeeding.Body mass index is 18.38 kg/m.  General Appearance: Casual and Disheveled  Eye Contact:   Fair  Speech:  Slow and Slurred  Volume:  Decreased  Mood:  Dysphoric  Affect:  Congruent and Flat  Thought Process:  Disorganized  Orientation:  Full (Time, Place, and Person) that is person and general situation  Thought Content:  Illogical  Suicidal Thoughts:  Yes.  without intent/plan-but generally dangerous to self no suicidal thoughts but unable to care for self at present  Homicidal Thoughts:  No  Memory:  Immediate;   Poor  Judgement:  Impaired  Insight:  Lacking and Shallow  Psychomotor Activity:  Normal  Concentration:  Concentration: Poor  Recall:  Poor  Fund of Knowledge:  Poor  Language:  Fair  Akathisia:  Negative  Handed:  Right  AIMS (if indicated):     Assets:  Physical Health Resilience  ADL's:  Intact  Cognition:  Impaired,  Mild  Sleep:  Number of Hours: 6.75     Treatment Plan Summary: Daily contact with patient to assess and evaluate symptoms and progress in treatment, Medication management and Plan Continue current one-to-one precautions continue reality-based therapy and cognitive work see med adjustments  Johnn Hai, MD 08/19/2018, 1:26 PM

## 2018-08-19 NOTE — Progress Notes (Signed)
Recreation Therapy Notes  Date: 7.13.20 Time: 1013 Location: 500 Hall Dayroom  Group Topic: Coping Skills  Goal Area(s) Addresses:  Patient will identify positive coping skills. Patient will identify benefits of using coping skills post d/c.  Behavioral Response:  None  Intervention:  [employer], dry erase marker, worksheet  Activity:  Mind Map.  Patients and LRT filled out first 8 boxes together with anger, substance abuse, being sad, anxiety, verbal abuse, torture and finances.  Patients will then come up with at least three coping skills per situation.  LRT will then write the coping skills on the board so patience can fill in the blank spots on their worksheets.  Education: Radiographer, therapeutic, Dentist.   Education Outcome: Acknowledges understanding/In group clarification offered/Needs additional education.   Clinical Observations/Feedback:  Pt did not participate.  Pt sat and observed.    Victorino Sparrow, LRT/CTRS     [name], Shanelle Clontz A 08/19/2018 11:04 AM

## 2018-08-20 LAB — GLUCOSE, CAPILLARY
Glucose-Capillary: 167 mg/dL — ABNORMAL HIGH (ref 70–99)
Glucose-Capillary: 296 mg/dL — ABNORMAL HIGH (ref 70–99)
Glucose-Capillary: 237 mg/dL — ABNORMAL HIGH (ref 70–99)
Glucose-Capillary: 306 mg/dL — ABNORMAL HIGH (ref 70–99)
Glucose-Capillary: 121 mg/dL — ABNORMAL HIGH (ref 70–99)

## 2018-08-20 MED ORDER — GLUCERNA SHAKE PO LIQD
237.0000 mL | Freq: Three times a day (TID) | ORAL | Status: DC
  Administered 2018-08-20 – 2018-08-27 (×12): 237 mL via ORAL

## 2018-08-20 NOTE — Progress Notes (Signed)
Pt was visible on the unit this evening. Pt continues to need prompting to eat / drink , but will cooperate with encouragement.

## 2018-08-20 NOTE — Progress Notes (Signed)
Recreation Therapy Notes  Date: 7.14.20 Time: 1012 Location: 500 [address] Dayroom  Group Topic: Anxiety  Goal Area(s) Addresses:  Patient will identify what makes them anxious. Patient will identify coping skills for anxiety.  Behavioral Response: None  Intervention: Worksheet  Activity: Introduction to Anxiety.  Patients were to identify what causes anxiety, physical symptoms they experience, thoughts they experience and coping skills they use for anxiety.  Education: Wellness, Dentist.   Education Outcome: Acknowledges education/In group clarification offered/Needs additional education.   Clinical Observations/Feedback:  Pt sat and observed.    Victorino Sparrow, LRT/CTRS     [name], Bosco Paparella A 08/20/2018 11:19 AM

## 2018-08-20 NOTE — [facility] Group Notes (Signed)
[facility] LCSW Group Therapy Note  Date/Time: 08/20/2018 @ 11am  Type of Therapy and Topic:  Group Therapy:  Overcoming Obstacles  Participation Level:  Minimum   Description of Group:    In this group patients will be encouraged to explore what they see as obstacles to their own wellness and recovery. They will be guided to discuss their thoughts, feelings, and behaviors related to these obstacles. The group will process together ways to cope with barriers, with attention given to specific choices patients can make. Each patient will be challenged to identify changes they are motivated to make in order to overcome their obstacles. This group will be process-oriented, with patients participating in exploration of their own experiences as well as giving and receiving support and challenge from other group members.  Therapeutic Goals: 1. Patient will identify personal and current obstacles as they relate to admission. 2. Patient will identify barriers that currently interfere with their wellness or overcoming obstacles.  3. Patient will identify feelings, thought process and behaviors related to these barriers. 4. Patient will identify two changes they are willing to make to overcome these obstacles:    Summary of Patient Progress  Patient attended group therapy today. Patient stayed in the group the entire time and did try to give input on her obstacle in life. Patient sat and observed most of the group.     Therapeutic Modalities:   Cognitive Behavioral Therapy Solution Focused Therapy Motivational Interviewing Relapse Prevention Therapy   Ardelle Anton, LCSW

## 2018-08-20 NOTE — Progress Notes (Signed)
Patient ID: Debbie Dorsey, female   DOB: Jun 09, 1970, 48 y.o.   MRN: [id number]  1:1 RN Note  D: Pt was asleep in bed this morning with even and unlabored respirations. Sitter is with pt. No signs of distress or discomfort noted. A: Close observation continues for safety of pt. R: Pt remained safe on the unit.

## 2018-08-20 NOTE — Progress Notes (Signed)
Patient ID: Debbie Dorsey, female   DOB: Dec 03, 1970, 48 y.o.   MRN: [id number]    1:1 RN Note  D: Pt ate dinner in her room and watched elevision in the dayroom. Sitter is with pt. No signs of distress or discomfort noted. A: Close observation continues for safety of pt. R: Pt remained safe on the unit.

## 2018-08-20 NOTE — Progress Notes (Signed)
Close obs note D:Pt observed sleeping in bed with eyes closed. RR even and unlabored. No distress noted. A: 1:1 observation continues for safety  R: pt remains safe

## 2018-08-20 NOTE — Progress Notes (Signed)
Patient ID: Debbie Dorsey, female   DOB: [birth year], 48 y.o.   MRN: [id number]    1:1 RN Note  D: Pt in dayroom watching television and attended group today. Sitter is with pt. No signs of distress or discomfort noted. A: Close observation continues for safety of pt. R: Pt remained safe on the unit.

## 2018-08-20 NOTE — Progress Notes (Signed)
D: Pt denies SI/HI/AVH. Pt is pleasant and cooperative. Pt visible in the dayroom some this evening. Pt continues to need encouragement to do things, but pt will listen but needs to be told most of the time.   A: Pt was offered support and encouragement. Pt was given scheduled medications. Pt was encourage to attend groups. Q 15 minute checks were done for safety.   R:Pt attends groups and interacts well with peers and staff. Pt is taking medication. Pt has no complaints.Pt receptive to treatment and safety maintained on unit.  Problem: Education: Goal: Mental status will improve Outcome: Progressing   Problem: Education: Goal: Emotional status will improve Outcome: Progressing

## 2018-08-20 NOTE — Progress Notes (Signed)
Patient ID: Debbie Dorsey, female   DOB: [birth year], 48 y.o.   MRN: [id number]   Doctor Jake Samples wrote a letter to extend patient's IVC. CSW faxed that letter to the clerk of court at [facility] for the hearing.

## 2018-08-20 NOTE — Plan of Care (Signed)
  Problem: Activity: Goal: Interest or engagement in activities will improve Outcome: Progressing

## 2018-08-20 NOTE — Progress Notes (Signed)
[facility] MD Progress Note  08/20/2018 3:02 PM Debbie Dorsey  MRN:  [id number] Subjective:   Patient continues to require continual supervision she is sluggish today she denies wanting to harm self does not fully understand all questions gives vague symptoms fragment answers again.  No evidence of hallucinations based on behavior but just disorganized behavior attempting to leave ward at times Principal Problem: Continued disorganization Diagnosis: Active Problems:   Schizophrenia ([place])  Total Time spent with patient: 20 minutes  Past Medical History:  Past Medical History:  Diagnosis Date  . Diabetes mellitus without complication ([place])    History reviewed. No pertinent surgical history. Family History:  Family History  Problem Relation Age of Onset  . Mental illness Sister     Social History:  Social History   Substance and Sexual Activity  Alcohol Use No     Social History   Substance and Sexual Activity  Drug Use No    Social History   Socioeconomic History  . Marital status: Single    Spouse name: Not on file  . Number of children: Not on file  . Years of education: Not on file  . Highest education level: Not on file  Occupational History  . Not on file  Social Needs  . Financial resource strain: Not on file  . Food insecurity    Worry: Not on file    Inability: Not on file  . Transportation needs    Medical: Not on file    Non-medical: Not on file  Tobacco Use  . Smoking status: Never Smoker  . Smokeless tobacco: Never Used  Substance and Sexual Activity  . Alcohol use: No  . Drug use: No  . Sexual activity: Not Currently  Lifestyle  . Physical activity    Days per week: Not on file    Minutes per session: Not on file  . Stress: Not on file  Relationships  . Social Herbalist on phone: Not on file    Gets together: Not on file    Attends religious service: Not on file    Active member of club or organization: Not on file    Attends  meetings of clubs or organizations: Not on file    Relationship status: Not on file  Other Topics Concern  . Not on file  Social History Narrative  . Not on file   Additional Social History:                         Sleep: Good  Appetite:  Good  Current Medications: Current Facility-Administered Medications  Medication Dose Route Frequency Provider Last Rate Last Dose  . acetaminophen (TYLENOL) tablet 650 mg  650 mg Oral Q6H PRN Sharma Covert, MD      . alum & mag hydroxide-simeth (MAALOX/MYLANTA) 200-200-20 MG/5ML suspension 30 mL  30 mL Oral Q4H PRN Sharma Covert, MD      . cloZAPine (CLOZARIL) tablet 75 mg  75 mg Oral QHS Johnn Hai, MD   75 mg at 08/19/18 2103  . feeding supplement (GLUCERNA SHAKE) (GLUCERNA SHAKE) liquid 237 mL  237 mL Oral TID BM Johnn Hai, MD      . gabapentin (NEURONTIN) capsule 200 mg  200 mg Oral TID Sharma Covert, MD   200 mg at 08/20/18 1227  . hydrOXYzine (ATARAX/VISTARIL) tablet 25 mg  25 mg Oral TID PRN Sharma Covert, MD   25 mg at  08/19/18 2103  . insulin aspart (novoLOG) injection 0-20 Units  0-20 Units Subcutaneous TID WC Patriciaann Clan E, PA-C   7 Units at 08/20/18 1224  . insulin aspart (novoLOG) injection 0-5 Units  0-5 Units Subcutaneous QHS Laverle Hobby, PA-C   3 Units at 08/17/18 2140  . insulin aspart (novoLOG) injection 6 Units  6 Units Subcutaneous TID WC Patriciaann Clan E, PA-C   6 Units at 08/20/18 1227  . insulin glargine (LANTUS) injection 22 Units  22 Units Subcutaneous Daily Sharma Covert, MD   22 Units at 08/20/18 0830  . magnesium hydroxide (MILK OF MAGNESIA) suspension 30 mL  30 mL Oral Daily PRN Sharma Covert, MD      . perphenazine (TRILAFON) tablet 2 mg  2 mg Oral BID Johnn Hai, MD   2 mg at 08/20/18 0830  . prenatal multivitamin tablet 1 tablet  1 tablet Oral Q1200 Johnn Hai, MD   1 tablet at 08/20/18 1226  . rivaroxaban (XARELTO) tablet 20 mg  20 mg Oral Daily Johnn Hai, MD    20 mg at 08/19/18 1756  . simvastatin (ZOCOR) tablet 40 mg  40 mg Oral q1800 Sharma Covert, MD   40 mg at 08/19/18 1756  . temazepam (RESTORIL) capsule 15 mg  15 mg Oral QHS Johnn Hai, MD   15 mg at 08/19/18 2103  . traZODone (DESYREL) tablet 50 mg  50 mg Oral QHS PRN Sharma Covert, MD   50 mg at 08/19/18 2103  . ziprasidone (GEODON) injection 10 mg  10 mg Intramuscular Q6H PRN Sharma Covert, MD   10 mg at 08/12/18 3646    Lab Results:  Results for orders placed or performed during the hospital encounter of 07/31/18 (from the past 48 hour(s))  Glucose, capillary     Status: Abnormal   Collection Time: 08/18/18  4:31 PM  Result Value Ref Range   Glucose-Capillary 235 (H) 70 - 99 mg/dL   Comment 1 Notify RN    Comment 2 Document in Chart   Glucose, capillary     Status: Abnormal   Collection Time: 08/18/18  8:23 PM  Result Value Ref Range   Glucose-Capillary 57 (L) 70 - 99 mg/dL  Glucose, capillary     Status: Abnormal   Collection Time: 08/18/18  8:39 PM  Result Value Ref Range   Glucose-Capillary 57 (L) 70 - 99 mg/dL   Comment 1 Notify RN   Glucose, capillary     Status: None   Collection Time: 08/18/18  9:02 PM  Result Value Ref Range   Glucose-Capillary 90 70 - 99 mg/dL  Glucose, capillary     Status: Abnormal   Collection Time: 08/19/18  6:18 AM  Result Value Ref Range   Glucose-Capillary 229 (H) 70 - 99 mg/dL  Glucose, capillary     Status: Abnormal   Collection Time: 08/19/18 11:34 AM  Result Value Ref Range   Glucose-Capillary 225 (H) 70 - 99 mg/dL   Comment 1 Notify RN    Comment 2 Document in Chart   Glucose, capillary     Status: Abnormal   Collection Time: 08/19/18  5:05 PM  Result Value Ref Range   Glucose-Capillary 129 (H) 70 - 99 mg/dL   Comment 1 Notify RN    Comment 2 Document in Chart   Glucose, capillary     Status: None   Collection Time: 08/19/18  7:55 PM  Result Value Ref Range   Glucose-Capillary 83  70 - 99 mg/dL  Glucose,  capillary     Status: Abnormal   Collection Time: 08/20/18  5:53 AM  Result Value Ref Range   Glucose-Capillary 167 (H) 70 - 99 mg/dL   Comment 1 Notify RN   Glucose, capillary     Status: Abnormal   Collection Time: 08/20/18  8:21 AM  Result Value Ref Range   Glucose-Capillary 296 (H) 70 - 99 mg/dL   Comment 1 Notify RN    Comment 2 Document in Chart   Glucose, capillary     Status: Abnormal   Collection Time: 08/20/18 12:17 PM  Result Value Ref Range   Glucose-Capillary 237 (H) 70 - 99 mg/dL   Comment 1 Notify RN    Comment 2 Document in Chart     Blood Alcohol level:  Lab Results  Component Value Date   ETH <10 07/28/2018   ETH <10 37/48/2707    Metabolic Disorder Labs: Lab Results  Component Value Date   HGBA1C 8.5 (H) 07/29/2018   MPG 197 07/29/2018   MPG 191.51 05/26/2018   No results found for: PROLACTIN No results found for: CHOL, TRIG, HDL, CHOLHDL, VLDL, LDLCALC  Physical Findings: AIMS: Facial and Oral Movements Muscles of Facial Expression: None, normal Lips and Perioral Area: None, normal Jaw: None, normal Tongue: None, normal,Extremity Movements Upper (arms, wrists, hands, fingers): None, normal Lower (legs, knees, ankles, toes): None, normal, Trunk Movements Neck, shoulders, hips: None, normal, Overall Severity Severity of abnormal movements (highest score from questions above): None, normal Incapacitation due to abnormal movements: None, normal Patient's awareness of abnormal movements (rate only patient's report): No Awareness, Dental Status Current problems with teeth and/or dentures?: No Does patient usually wear dentures?: No  CIWA:  CIWA-Ar Total: 2 COWS:  COWS Total Score: 0  Musculoskeletal: Strength & Muscle Tone: within normal limits Gait & Station: normal Patient leans: N/A  Psychiatric Specialty Exam: Physical Exam  ROS  Blood pressure 93/66, pulse (!) 140, temperature 98.7 F (37.1 C), temperature source Oral, resp. rate 18,  height 4\' 11"  (1.499 m), weight 41.3 kg, SpO2 100 %, unknown if currently breastfeeding.Body mass index is 18.38 kg/m.  General Appearance: Casual  Eye Contact:  Fair  Speech:  Garbled and Slurred  Volume:  Decreased  Mood:  Dysphoric  Affect:  Blunt  Thought Process:  Irrelevant and Descriptions of Associations: Loose  Orientation:  Other:  Person place situation  Thought Content:  Illogical  Suicidal Thoughts:  No  Homicidal Thoughts:  No  Memory:  Immediate;   Fair  Judgement:  Impaired  Insight:  Shallow  Psychomotor Activity:  Decreased  Concentration:  Concentration: Poor  Recall:  Poor  Fund of Knowledge:  Poor  Language:  Poor  Akathisia:  Negative  Handed:  Right  AIMS (if indicated):     Assets:  Physical Health Resilience  ADL's:  Intact  Cognition:  WNL  Sleep:  Number of Hours: 6.75     Treatment Plan Summary: Daily contact with patient to assess and evaluate symptoms and progress in treatment and Medication management continue cognitive and reality based therapies continue current precautions continue clozapine perphenazine combination monitor vitals is tachycardic but probably somewhat dehydrated/push fluids  Johnn Hai, MD 08/20/2018, 3:02 PM

## 2018-08-20 NOTE — Progress Notes (Signed)
Patient ID: Debbie Dorsey, female   DOB: 15-Sep-1970, 49 y.o.   MRN: [id number]  [place] NOVEL CORONAVIRUS (COVID-19) DAILY CHECK-OFF SYMPTOMS - answer yes or no to each - every day NO YES  Have you had a fever in the past 24 hours?  . Fever (Temp > 37.80C / 100F) X   Have you had any of these symptoms in the past 24 hours? . New Cough .  Sore Throat  .  Shortness of Breath .  Difficulty Breathing .  Unexplained Body Aches   X   Have you had any one of these symptoms in the past 24 hours not related to allergies?   . Runny Nose .  Nasal Congestion .  Sneezing   X   If you have had runny nose, nasal congestion, sneezing in the past 24 hours, has it worsened?  X   EXPOSURES - check yes or no X   Have you traveled outside the state in the past 14 days?  X   Have you been in contact with someone with a confirmed diagnosis of COVID-19 or PUI in the past 14 days without wearing appropriate PPE?  X   Have you been living in the same home as a person with confirmed diagnosis of COVID-19 or a PUI (household contact)?    X   Have you been diagnosed with COVID-19?    X              What to do next: Answered NO to all: Answered YES to anything:   Proceed with unit schedule Follow the [facility] Inpatient Flowsheet.

## 2018-08-21 LAB — GLUCOSE, CAPILLARY
Glucose-Capillary: 280 mg/dL — ABNORMAL HIGH (ref 70–99)
Glucose-Capillary: 231 mg/dL — ABNORMAL HIGH (ref 70–99)
Glucose-Capillary: 278 mg/dL — ABNORMAL HIGH (ref 70–99)
Glucose-Capillary: 177 mg/dL — ABNORMAL HIGH (ref 70–99)

## 2018-08-21 NOTE — Progress Notes (Signed)
D: Pt denies SI/HI/AVH. Pt is pleasant and cooperative. Pt continues to be soft spoken and guarded at times. Pt visible on the unit at times.   A: Pt was offered support and encouragement. Pt was given scheduled medications. Pt was encourage to attend groups. Q 15 minute checks were done for safety.   R: Pt attends groups and interacts well with peers and staff. Pt is taking medication. Pt has no complaints.Pt receptive to treatment and safety maintained on unit.

## 2018-08-21 NOTE — Progress Notes (Signed)
Nursing 1:1 note D:Pt observed sleeping in bed with eyes closed. RR even and unlabored. No distress noted. A: 1:1 observation continues for safety  R: pt remains safe  

## 2018-08-21 NOTE — Progress Notes (Signed)
Close Obs Note D: Patient is observed to be dancing and smiling in the dayroom with her peers and staff. It is recreation therapy group, and she is participating with frequent redirection and encouragement. She still tends to wander off and needs the redirection from staff.  A: Maintained close observation

## 2018-08-21 NOTE — Progress Notes (Signed)
The focus of this group is to help patients establish daily goals to achieve during treatment and discuss how the patient can incorporate goal setting into their daily lives to aide in recovery. 

## 2018-08-21 NOTE — Progress Notes (Signed)
[facility] MD Progress Note  08/21/2018 9:40 AM AMZIE SILLAS  MRN:  [id number] Subjective:    Patient is finally showing some improvement she is alert oriented to person place situation not exact date but she gives me full sentences today makes good eye contact she is steady on her feet she is drinking and eating well we discussed the possibility in team of discontinuing the one-to-one precaution so we will simply see how the morning goes and leave this to nursing. Principal Problem: Exacerbation of underlying psychotic disorder that had previously responded to Risperdal but needing clozapine at this point in time and combination with potent D2 blocker Diagnosis: Active Problems:   Schizophrenia ([place])  Total Time spent with patient: 20 minutes  Past Medical History:  Past Medical History:  Diagnosis Date  . Diabetes mellitus without complication ([place])    History reviewed. No pertinent surgical history. Family History:  Family History  Problem Relation Age of Onset  . Mental illness Sister    Family Psychiatric  History: see eval Social History:  Social History   Substance and Sexual Activity  Alcohol Use No     Social History   Substance and Sexual Activity  Drug Use No    Social History   Socioeconomic History  . Marital status: Single    Spouse name: Not on file  . Number of children: Not on file  . Years of education: Not on file  . Highest education level: Not on file  Occupational History  . Not on file  Social Needs  . Financial resource strain: Not on file  . Food insecurity    Worry: Not on file    Inability: Not on file  . Transportation needs    Medical: Not on file    Non-medical: Not on file  Tobacco Use  . Smoking status: Never Smoker  . Smokeless tobacco: Never Used  Substance and Sexual Activity  . Alcohol use: No  . Drug use: No  . Sexual activity: Not Currently  Lifestyle  . Physical activity    Days per week: Not on file    Minutes per  session: Not on file  . Stress: Not on file  Relationships  . Social Herbalist on phone: Not on file    Gets together: Not on file    Attends religious service: Not on file    Active member of club or organization: Not on file    Attends meetings of clubs or organizations: Not on file    Relationship status: Not on file  Other Topics Concern  . Not on file  Social History Narrative  . Not on file   Additional Social History:                         Sleep: Fair  Appetite:  Good  Current Medications: Current Facility-Administered Medications  Medication Dose Route Frequency Provider Last Rate Last Dose  . acetaminophen (TYLENOL) tablet 650 mg  650 mg Oral Q6H PRN Sharma Covert, MD      . alum & mag hydroxide-simeth (MAALOX/MYLANTA) 200-200-20 MG/5ML suspension 30 mL  30 mL Oral Q4H PRN Sharma Covert, MD      . cloZAPine (CLOZARIL) tablet 75 mg  75 mg Oral QHS Johnn Hai, MD   75 mg at 08/20/18 2058  . feeding supplement (GLUCERNA SHAKE) (GLUCERNA SHAKE) liquid 237 mL  237 mL Oral TID BM Johnn Hai, MD  237 mL at 08/21/18 0825  . gabapentin (NEURONTIN) capsule 200 mg  200 mg Oral TID Sharma Covert, MD   200 mg at 08/21/18 1448  . hydrOXYzine (ATARAX/VISTARIL) tablet 25 mg  25 mg Oral TID PRN Sharma Covert, MD   25 mg at 08/20/18 2058  . insulin aspart (novoLOG) injection 0-20 Units  0-20 Units Subcutaneous TID WC Laverle Hobby, PA-C   11 Units at 08/21/18 1856  . insulin aspart (novoLOG) injection 0-5 Units  0-5 Units Subcutaneous QHS Laverle Hobby, PA-C   3 Units at 08/17/18 2140  . insulin aspart (novoLOG) injection 6 Units  6 Units Subcutaneous TID WC Patriciaann Clan E, PA-C   6 Units at 08/21/18 [phone]  . insulin glargine (LANTUS) injection 22 Units  22 Units Subcutaneous Daily Sharma Covert, MD   22 Units at 08/21/18 [phone]  . magnesium hydroxide (MILK OF MAGNESIA) suspension 30 mL  30 mL Oral Daily PRN Sharma Covert, MD       . perphenazine (TRILAFON) tablet 2 mg  2 mg Oral BID Johnn Hai, MD   2 mg at 08/21/18 0814  . prenatal multivitamin tablet 1 tablet  1 tablet Oral Q1200 Johnn Hai, MD   1 tablet at 08/20/18 1226  . rivaroxaban (XARELTO) tablet 20 mg  20 mg Oral Daily Johnn Hai, MD   20 mg at 08/20/18 1738  . simvastatin (ZOCOR) tablet 40 mg  40 mg Oral q1800 Sharma Covert, MD   40 mg at 08/20/18 1728  . temazepam (RESTORIL) capsule 15 mg  15 mg Oral QHS Johnn Hai, MD   15 mg at 08/20/18 2058  . traZODone (DESYREL) tablet 50 mg  50 mg Oral QHS PRN Sharma Covert, MD   50 mg at 08/20/18 2058  . ziprasidone (GEODON) injection 10 mg  10 mg Intramuscular Q6H PRN Sharma Covert, MD   10 mg at 08/12/18 3785    Lab Results:  Results for orders placed or performed during the hospital encounter of 07/31/18 (from the past 48 hour(s))  Glucose, capillary     Status: Abnormal   Collection Time: 08/19/18 11:34 AM  Result Value Ref Range   Glucose-Capillary 225 (H) 70 - 99 mg/dL   Comment 1 Notify RN    Comment 2 Document in Chart   Glucose, capillary     Status: Abnormal   Collection Time: 08/19/18  5:05 PM  Result Value Ref Range   Glucose-Capillary 129 (H) 70 - 99 mg/dL   Comment 1 Notify RN    Comment 2 Document in Chart   Glucose, capillary     Status: None   Collection Time: 08/19/18  7:55 PM  Result Value Ref Range   Glucose-Capillary 83 70 - 99 mg/dL  Glucose, capillary     Status: Abnormal   Collection Time: 08/20/18  5:53 AM  Result Value Ref Range   Glucose-Capillary 167 (H) 70 - 99 mg/dL   Comment 1 Notify RN   Glucose, capillary     Status: Abnormal   Collection Time: 08/20/18  8:21 AM  Result Value Ref Range   Glucose-Capillary 296 (H) 70 - 99 mg/dL   Comment 1 Notify RN    Comment 2 Document in Chart   Glucose, capillary     Status: Abnormal   Collection Time: 08/20/18 12:17 PM  Result Value Ref Range   Glucose-Capillary 237 (H) 70 - 99 mg/dL   Comment 1 Notify RN  Comment 2 Document in Chart   Glucose, capillary     Status: Abnormal   Collection Time: 08/20/18  4:57 PM  Result Value Ref Range   Glucose-Capillary 306 (H) 70 - 99 mg/dL   Comment 1 Notify RN    Comment 2 Document in Chart   Glucose, capillary     Status: Abnormal   Collection Time: 08/20/18  7:36 PM  Result Value Ref Range   Glucose-Capillary 121 (H) 70 - 99 mg/dL  Glucose, capillary     Status: Abnormal   Collection Time: 08/21/18  5:44 AM  Result Value Ref Range   Glucose-Capillary 280 (H) 70 - 99 mg/dL    Blood Alcohol level:  Lab Results  Component Value Date   ETH <10 07/28/2018   ETH <10 36/64/4034    Metabolic Disorder Labs: Lab Results  Component Value Date   HGBA1C 8.5 (H) 07/29/2018   MPG 197 07/29/2018   MPG 191.51 05/26/2018   No results found for: PROLACTIN No results found for: CHOL, TRIG, HDL, CHOLHDL, VLDL, LDLCALC  Physical Findings: AIMS: Facial and Oral Movements Muscles of Facial Expression: None, normal Lips and Perioral Area: None, normal Jaw: None, normal Tongue: None, normal,Extremity Movements Upper (arms, wrists, hands, fingers): None, normal Lower (legs, knees, ankles, toes): None, normal, Trunk Movements Neck, shoulders, hips: None, normal, Overall Severity Severity of abnormal movements (highest score from questions above): None, normal Incapacitation due to abnormal movements: None, normal Patient's awareness of abnormal movements (rate only patient's report): No Awareness, Dental Status Current problems with teeth and/or dentures?: No Does patient usually wear dentures?: No  CIWA:  CIWA-Ar Total: 2 COWS:  COWS Total Score: 0  Musculoskeletal: Strength & Muscle Tone: decreased Gait & Station: broad based Patient leans: N/A  Psychiatric Specialty Exam: Physical Exam  ROS  Blood pressure 116/62, pulse (!) 131, temperature 98.7 F (37.1 C), temperature source Oral, resp. rate 18, height 4\' 11"  (1.499 m), weight 41.3 kg,  SpO2 100 %, unknown if currently breastfeeding.Body mass index is 18.38 kg/m.  General Appearance: Casual  Eye Contact:  Minimal  Speech:  Garbled  Volume:  Decreased  Mood:  Dysphoric  Affect:  Appropriate and Blunt  Thought Process:  Goal Directed and Descriptions of Associations: Circumstantial  Orientation:  Full (Time, Place, and Person)  Thought Content:  Logical  Suicidal Thoughts:  No  Homicidal Thoughts:  No  Memory:  Immediate;   Fair  Judgement:  Fair  Insight:  Fair  Psychomotor Activity:  Normal  Concentration:  Concentration: Fair  Recall:  [employer] of Knowledge:  Fair  Language:  Fair  Akathisia:  Negative  Handed:  r  AIMS (if indicated):     Assets:  Physical Health  ADL's:  Intact  Cognition:  WNL  Sleep:  Number of Hours: 7.75     Treatment Plan Summary: Daily contact with patient to assess and evaluate symptoms and progress in treatment, Medication management and Plan No change in meds today finally responding continue to monitor  Johnn Hai, MD 08/21/2018, 9:40 AM

## 2018-08-21 NOTE — Progress Notes (Signed)
Nursing 1:1 note D:Pt observed laying in bed with eyes open. RR even and unlabored.  A: 1:1 observation continues for safety  R: pt remains safe

## 2018-08-21 NOTE — Progress Notes (Addendum)
D: Patient attempting to elope, she is walking repeatedly to main door, despite redirection. She kept asking for "my salad" multiple times. She received a salad and calmed down.  A: Continued close observation.

## 2018-08-21 NOTE — Progress Notes (Signed)
Patient ID: Debbie Dorsey, female   DOB: 14-Apr-1970, 48 y.o.   MRN: [id number]   CSW contacted pt's grandfather to keep him updated on the patient's progression. Pt's grandfather stated that he wants the patient to be able to stay a few more days to make sure that she is okay. He stated that he talked to someone and that the patient is continuing to have issues with sleep and wandering. Pt's grandfather states that he does not want her to come home with the same behaviors because she will end up right back. Pt's grandfather states that he wants her back, but he wants to make sure that she is okay.

## 2018-08-21 NOTE — Progress Notes (Signed)
Inpatient Diabetes Program Recommendations  AACE/ADA: New Consensus Statement on Inpatient Glycemic Control (2015)  Target Ranges:  Prepandial:   less than 140 mg/dL      Peak postprandial:   less than 180 mg/dL (1-2 hours)      Critically ill patients:  140 - 180 mg/dL   Lab Results  Component Value Date   GLUCAP 280 (H) 08/21/2018   HGBA1C 8.5 (H) 07/29/2018    Review of Glycemic Control  FBS 280 mg/dL this am. Needs Lantus adjustment.  Inpatient Diabetes Program Recommendations:     Increase Lantus to 24 units QD.  Will watch post-prandials and adjust meal coverage as needed.   Follow.   Thank you. Lorenda Peck, RD, LDN, CDE Inpatient Diabetes Coordinator [phone]

## 2018-08-21 NOTE — Progress Notes (Signed)
D: Patient presents bizarre, remote, inconsistent. She wanders away during assessment and is electively mute. She may have been responding to internal stimuli or it could have been akathisia. Patient denies SI. She slept a little last night, her appetite is improving and she is staying physically active and out of her room. A: Patient checked q15 min, and checks reviewed. Reviewed medication changes with patient and educated on side effects. Educated patient on importance of attending group therapy sessions and educated on several coping skills. Encouarged participation in milieu through recreation therapy and attending meals with peers. Support and encouragement provided. Fluids offered. R: Patient receptive to education on medications, and is medication compliant. Patient contracts for safety on the unit.

## 2018-08-21 NOTE — Progress Notes (Addendum)
D: Patient observed in dayroom, watching a movie on TV. She is calm and cooperative. She verbalizes no concerns. Her respirations are even and unlabored. She is in NAD.  A: Close observation continued.

## 2018-08-21 NOTE — Progress Notes (Signed)
   08/21/18 0825  COVID-19 Daily Checkoff  Have you had a fever (temp > 37.80C/100F)  in the past 24 hours?  No  If you have had runny nose, nasal congestion, sneezing in the past 24 hours, has it worsened? No  COVID-19 EXPOSURE  Have you traveled outside the state in the past 14 days? No  Have you been in contact with someone with a confirmed diagnosis of COVID-19 or PUI in the past 14 days without wearing appropriate PPE? No  Have you been living in the same home as a person with confirmed diagnosis of COVID-19 or a PUI (household contact)? No  Have you been diagnosed with COVID-19? No

## 2018-08-21 NOTE — Progress Notes (Signed)
Recreation Therapy Notes  Date: 7.15.20 Time: 1010 Location:  [address] Dayroom  Group Topic: Wellness  Goal Area(s) Addresses:  Patient will define components of whole wellness. Patient will verbalize benefit of whole wellness.  Behavioral Response: Engaged  Intervention: Exercise, Music  Activity: Exercise.  LRT played music and allowed each patient to lead the group in an exercise.  Patients were allowed to take breaks as needed.  Education: Wellness, Dentist.   Education Outcome: Acknowledges education/In group clarification offered/Needs additional education.   Clinical Observations/Feedback: Pt was engaged when prompted.  Pt needed some redirection from trying to leave group.  Pt was smiling and engaging with peers.     Victorino Sparrow, LRT/CTRS     [name], Marcellus Pulliam A 08/21/2018 11:12 AM

## 2018-08-22 ENCOUNTER — Ambulatory Visit (HOSPITAL_COMMUNITY): Payer: Medicare Other | Attending: Psychiatry

## 2018-08-22 DIAGNOSIS — R4181 Age-related cognitive decline: Secondary | ICD-10-CM | POA: Insufficient documentation

## 2018-08-22 LAB — CBC WITH DIFFERENTIAL/PLATELET
Abs Immature Granulocytes: 0.03 10*3/uL (ref 0.00–0.07)
Basophils Absolute: 0 10*3/uL (ref 0.0–0.1)
Basophils Relative: 1 %
Eosinophils Absolute: 0.1 10*3/uL (ref 0.0–0.5)
Eosinophils Relative: 2 %
HCT: 38.4 % (ref 36.0–46.0)
Hemoglobin: 12 g/dL (ref 12.0–15.0)
Immature Granulocytes: 1 %
Lymphocytes Relative: 45 %
Lymphs Abs: 2.7 10*3/uL (ref 0.7–4.0)
MCH: 32 pg (ref 26.0–34.0)
MCHC: 31.3 g/dL (ref 30.0–36.0)
MCV: 102.4 fL — ABNORMAL HIGH (ref 80.0–100.0)
Monocytes Absolute: 0.4 10*3/uL (ref 0.1–1.0)
Monocytes Relative: 8 %
Neutro Abs: 2.5 10*3/uL (ref 1.7–7.7)
Neutrophils Relative %: 43 %
Platelets: 363 10*3/uL (ref 150–400)
RBC: 3.75 MIL/uL — ABNORMAL LOW (ref 3.87–5.11)
RDW: 13.2 % (ref 11.5–15.5)
WBC: 5.9 10*3/uL (ref 4.0–10.5)
nRBC: 0 % (ref 0.0–0.2)

## 2018-08-22 LAB — URINALYSIS, COMPLETE (UACMP) WITH MICROSCOPIC
Bacteria, UA: NONE SEEN
Bilirubin Urine: NEGATIVE
Glucose, UA: >=500 mg/dL — AB
Hgb urine dipstick: NEGATIVE
Ketones, ur: NEGATIVE mg/dL
Leukocytes,Ua: NEGATIVE
Nitrite: NEGATIVE
Protein, ur: NEGATIVE mg/dL
Specific Gravity, Urine: 1.018 (ref 1.005–1.030)
pH: 6 (ref 5.0–8.0)

## 2018-08-22 LAB — GLUCOSE, CAPILLARY
Glucose-Capillary: 195 mg/dL — ABNORMAL HIGH (ref 70–99)
Glucose-Capillary: 263 mg/dL — ABNORMAL HIGH (ref 70–99)
Glucose-Capillary: 251 mg/dL — ABNORMAL HIGH (ref 70–99)
Glucose-Capillary: 221 mg/dL — ABNORMAL HIGH (ref 70–99)

## 2018-08-22 MED ORDER — LORAZEPAM 1 MG PO TABS
1.0000 mg | ORAL_TABLET | ORAL | Status: DC | PRN
  Administered 2018-08-22 – 2018-08-25 (×5): 1 mg via ORAL
  Filled 2018-08-22 (×5): qty 1

## 2018-08-22 NOTE — Progress Notes (Signed)
Nursing 1:1 note D:Pt observed sleeping in bed with eyes closed. RR even and unlabored. No distress noted. A: close observation continues for safety  R: pt remains safe  

## 2018-08-22 NOTE — Progress Notes (Signed)
Nursing 1:1 note D:Pt observed sleeping in bed with eyes closed. RR even and unlabored.  A: 1:1 observation continues for safety  R: pt remains safe  

## 2018-08-22 NOTE — Progress Notes (Signed)
D: Pt denies SI/HI/AVH. Pt is pleasant and cooperative. Pt visible on the unit this evening. Pt more vocal when talking. Pt stated she had a good day, pt said she felt better due to eating more.  A: Pt was offered support and encouragement. Pt was given scheduled medications. Pt was encourage to attend groups. Q 15 minute checks were done for safety.  R: Pt is taking medication. Pt has no complaints.Pt receptive to treatment and safety maintained on unit.

## 2018-08-22 NOTE — Progress Notes (Signed)
Note:Patient on closed observation for safety.  Medication given as prescribed.  Routine safety checks maintained every 15 minutes.  Denies suicidal thoughts, auditory and visual hallucinations.  Continues to need redirection on the unit.  Patient is safe on the unit with supervision.

## 2018-08-22 NOTE — Progress Notes (Signed)
Closed observation: Patient maintained on closed observation for safety.  Patient is alert and oriented to person and place.  Routine safety checks maintained every 15 minutes.  Patient visible in milieu watching TV and interacting with staff.  Food and fluid intake encouraged.  Patient is safe on the unit with supervision.

## 2018-08-22 NOTE — Progress Notes (Signed)
Closed observation: Patient on closed observation for safety.  Patient visible in milieu watching TV.  Routine safety checks maintained every 15 minutes.  Offered support and encouragement as needed.  Patient is safe on the unit with supervision.

## 2018-08-22 NOTE — Progress Notes (Signed)
[facility] MD Progress Note  08/22/2018 8:46 AM Debbie Dorsey  MRN:  [id number] Subjective:   Patient alert oriented to person place general situation has to be reoriented to the day and date but overall gives me clearer sentences denies wanting to harm self denies current hallucinations makes some disjointed possibly delusional statements that are vague, however due to the cognitive decline despite some resolution in psychosis team is concerned about possible organic pathology and recommend CT scan of the head Principal Problem: <principal problem not specified> Diagnosis: Active Problems:   Schizophrenia ([place])  Total Time spent with patient: 20 minutes Past Medical History:  Past Medical History:  Diagnosis Date  . Diabetes mellitus without complication ([place])    History reviewed. No pertinent surgical history. Family History:  Family History  Problem Relation Age of Onset  . Mental illness Sister    Family Psychiatric  History: see eval Social History:  Social History   Substance and Sexual Activity  Alcohol Use No     Social History   Substance and Sexual Activity  Drug Use No    Social History   Socioeconomic History  . Marital status: Single    Spouse name: Not on file  . Number of children: Not on file  . Years of education: Not on file  . Highest education level: Not on file  Occupational History  . Not on file  Social Needs  . Financial resource strain: Not on file  . Food insecurity    Worry: Not on file    Inability: Not on file  . Transportation needs    Medical: Not on file    Non-medical: Not on file  Tobacco Use  . Smoking status: Never Smoker  . Smokeless tobacco: Never Used  Substance and Sexual Activity  . Alcohol use: No  . Drug use: No  . Sexual activity: Not Currently  Lifestyle  . Physical activity    Days per week: Not on file    Minutes per session: Not on file  . Stress: Not on file  Relationships  . Social Herbalist on  phone: Not on file    Gets together: Not on file    Attends religious service: Not on file    Active member of club or organization: Not on file    Attends meetings of clubs or organizations: Not on file    Relationship status: Not on file  Other Topics Concern  . Not on file  Social History Narrative  . Not on file   Additional Social History:                         Sleep: Good  Appetite:  Good  Current Medications: Current Facility-Administered Medications  Medication Dose Route Frequency Provider Last Rate Last Dose  . acetaminophen (TYLENOL) tablet 650 mg  650 mg Oral Q6H PRN Sharma Covert, MD      . alum & mag hydroxide-simeth (MAALOX/MYLANTA) 200-200-20 MG/5ML suspension 30 mL  30 mL Oral Q4H PRN Sharma Covert, MD      . cloZAPine (CLOZARIL) tablet 75 mg  75 mg Oral QHS Johnn Hai, MD   75 mg at 08/21/18 2105  . feeding supplement (GLUCERNA SHAKE) (GLUCERNA SHAKE) liquid 237 mL  237 mL Oral TID BM Johnn Hai, MD   237 mL at 08/21/18 1402  . gabapentin (NEURONTIN) capsule 200 mg  200 mg Oral TID Sharma Covert, MD  200 mg at 08/22/18 0803  . hydrOXYzine (ATARAX/VISTARIL) tablet 25 mg  25 mg Oral TID PRN Sharma Covert, MD   25 mg at 08/21/18 2105  . insulin aspart (novoLOG) injection 0-20 Units  0-20 Units Subcutaneous TID WC Patriciaann Clan E, PA-C   4 Units at 08/22/18 [phone]  . insulin aspart (novoLOG) injection 0-5 Units  0-5 Units Subcutaneous QHS Laverle Hobby, PA-C   3 Units at 08/17/18 2140  . insulin aspart (novoLOG) injection 6 Units  6 Units Subcutaneous TID WC Patriciaann Clan E, PA-C   6 Units at 08/22/18 2778  . insulin glargine (LANTUS) injection 22 Units  22 Units Subcutaneous Daily Sharma Covert, MD   22 Units at 08/22/18 0802  . LORazepam (ATIVAN) tablet 1 mg  1 mg Oral Q4H PRN Johnn Hai, MD      . magnesium hydroxide (MILK OF MAGNESIA) suspension 30 mL  30 mL Oral Daily PRN Sharma Covert, MD      . perphenazine  (TRILAFON) tablet 2 mg  2 mg Oral BID Johnn Hai, MD   2 mg at 08/22/18 0803  . prenatal multivitamin tablet 1 tablet  1 tablet Oral Q1200 Johnn Hai, MD   1 tablet at 08/21/18 1158  . rivaroxaban (XARELTO) tablet 20 mg  20 mg Oral Daily Johnn Hai, MD   20 mg at 08/21/18 1711  . simvastatin (ZOCOR) tablet 40 mg  40 mg Oral q1800 Sharma Covert, MD   40 mg at 08/21/18 1711  . temazepam (RESTORIL) capsule 15 mg  15 mg Oral QHS Johnn Hai, MD   15 mg at 08/21/18 2105  . traZODone (DESYREL) tablet 50 mg  50 mg Oral QHS PRN Sharma Covert, MD   50 mg at 08/21/18 2105  . ziprasidone (GEODON) injection 10 mg  10 mg Intramuscular Q6H PRN Sharma Covert, MD   10 mg at 08/12/18 2423    Lab Results:  Results for orders placed or performed during the hospital encounter of 07/31/18 (from the past 48 hour(s))  Glucose, capillary     Status: Abnormal   Collection Time: 08/20/18 12:17 PM  Result Value Ref Range   Glucose-Capillary 237 (H) 70 - 99 mg/dL   Comment 1 Notify RN    Comment 2 Document in Chart   Glucose, capillary     Status: Abnormal   Collection Time: 08/20/18  4:57 PM  Result Value Ref Range   Glucose-Capillary 306 (H) 70 - 99 mg/dL   Comment 1 Notify RN    Comment 2 Document in Chart   Glucose, capillary     Status: Abnormal   Collection Time: 08/20/18  7:36 PM  Result Value Ref Range   Glucose-Capillary 121 (H) 70 - 99 mg/dL  Glucose, capillary     Status: Abnormal   Collection Time: 08/21/18  5:44 AM  Result Value Ref Range   Glucose-Capillary 280 (H) 70 - 99 mg/dL  Glucose, capillary     Status: Abnormal   Collection Time: 08/21/18 12:01 PM  Result Value Ref Range   Glucose-Capillary 231 (H) 70 - 99 mg/dL  Glucose, capillary     Status: Abnormal   Collection Time: 08/21/18  5:12 PM  Result Value Ref Range   Glucose-Capillary 278 (H) 70 - 99 mg/dL  Glucose, capillary     Status: Abnormal   Collection Time: 08/21/18  7:38 PM  Result Value Ref Range    Glucose-Capillary 177 (H) 70 - 99 mg/dL  Glucose, capillary     Status: Abnormal   Collection Time: 08/22/18  6:22 AM  Result Value Ref Range   Glucose-Capillary 195 (H) 70 - 99 mg/dL  CBC with Differential/Platelet     Status: Abnormal   Collection Time: 08/22/18  6:38 AM  Result Value Ref Range   WBC 5.9 4.0 - 10.5 K/uL   RBC 3.75 (L) 3.87 - 5.11 MIL/uL   Hemoglobin 12.0 12.0 - 15.0 g/dL   HCT 38.4 36.0 - 46.0 %   MCV 102.4 (H) 80.0 - 100.0 fL   MCH 32.0 26.0 - 34.0 pg   MCHC 31.3 30.0 - 36.0 g/dL   RDW 13.2 11.5 - 15.5 %   Platelets 363 150 - 400 K/uL   nRBC 0.0 0.0 - 0.2 %   Neutrophils Relative % 43 %   Neutro Abs 2.5 1.7 - 7.7 K/uL   Lymphocytes Relative 45 %   Lymphs Abs 2.7 0.7 - 4.0 K/uL   Monocytes Relative 8 %   Monocytes Absolute 0.4 0.1 - 1.0 K/uL   Eosinophils Relative 2 %   Eosinophils Absolute 0.1 0.0 - 0.5 K/uL   Basophils Relative 1 %   Basophils Absolute 0.0 0.0 - 0.1 K/uL   Immature Granulocytes 1 %   Abs Immature Granulocytes 0.03 0.00 - 0.07 K/uL    Comment: Performed at [facility], [place] [address], [place], [place] [address]    Blood Alcohol level:  Lab Results  Component Value Date   ETH <10 07/28/2018   ETH <10 14/43/1540    Metabolic Disorder Labs: Lab Results  Component Value Date   HGBA1C 8.5 (H) 07/29/2018   MPG 197 07/29/2018   MPG 191.51 05/26/2018   No results found for: PROLACTIN No results found for: CHOL, TRIG, HDL, CHOLHDL, VLDL, LDLCALC  Physical Findings: AIMS: Facial and Oral Movements Muscles of Facial Expression: None, normal Lips and Perioral Area: None, normal Jaw: None, normal Tongue: None, normal,Extremity Movements Upper (arms, wrists, hands, fingers): None, normal Lower (legs, knees, ankles, toes): None, normal, Trunk Movements Neck, shoulders, hips: None, normal, Overall Severity Severity of abnormal movements (highest score from questions above): None, normal Incapacitation due to abnormal  movements: None, normal Patient's awareness of abnormal movements (rate only patient's report): No Awareness, Dental Status Current problems with teeth and/or dentures?: No Does patient usually wear dentures?: No  CIWA:  CIWA-Ar Total: 2 COWS:  COWS Total Score: 0  Musculoskeletal: Strength & Muscle Tone: within normal limits Gait & Station: normal Patient leans: N/A  Psychiatric Specialty Exam: Physical Exam  ROS  Blood pressure 116/62, pulse (!) 131, temperature 98.7 F (37.1 C), temperature source Oral, resp. rate 18, height 4\' 11"  (1.499 m), weight 41.3 kg, SpO2 100 %, unknown if currently breastfeeding.Body mass index is 18.38 kg/m.  General Appearance: Casual  Eye Contact:  Good  Speech:  Soft/slow  Volume:  Decreased  Mood:  Anxious and Depressed  Affect:  Blunt  Thought Process:  Irrelevant and Descriptions of Associations: Loose  Orientation:  Other:  Person place time situation reoriented to date  Thought Content:  Illogical and Delusions  Suicidal Thoughts:  No  Homicidal Thoughts:  No  Memory:  Immediate;   Fair  Judgement:  Fair  Insight:  Fair  Psychomotor Activity:  Normal  Concentration:  Concentration: Fair  Recall:  [employer] of Knowledge:  Fair  Language:  Fair  Akathisia:  Negative  Handed:  Right  AIMS (if indicated):  Assets:  Leisure Time Physical Health  ADL's:  Intact  Cognition:  WNL  Sleep:  Number of Hours: 5.75     Treatment Plan Summary: Daily contact with patient to assess and evaluate symptoms and progress in treatment No change in current psychotropic meds continue current precautions scan today due to cognitive decline and lack of recovery in this regard.  Johnn Hai, MD 08/22/2018, 8:46 AM

## 2018-08-23 DIAGNOSIS — F29 Unspecified psychosis not due to a substance or known physiological condition: Secondary | ICD-10-CM

## 2018-08-23 LAB — GLUCOSE, CAPILLARY
Glucose-Capillary: 277 mg/dL — ABNORMAL HIGH (ref 70–99)
Glucose-Capillary: 238 mg/dL — ABNORMAL HIGH (ref 70–99)
Glucose-Capillary: 286 mg/dL — ABNORMAL HIGH (ref 70–99)
Glucose-Capillary: 193 mg/dL — ABNORMAL HIGH (ref 70–99)

## 2018-08-23 MED ORDER — INSULIN GLARGINE 100 UNIT/ML ~~LOC~~ SOLN
28.0000 [IU] | Freq: Every day | SUBCUTANEOUS | Status: DC
  Administered 2018-08-24: 28 [IU] via SUBCUTANEOUS

## 2018-08-23 NOTE — Progress Notes (Signed)
Nursing 1:1 note D:Pt observed sleeping in bed with eyes closed. RR even and unlabored. No distress noted. A: 1:1 observation continues for safety  R: pt remains safe  

## 2018-08-23 NOTE — Progress Notes (Signed)
Patient ID: Debbie Dorsey, female   DOB: 13-May-1970, 48 y.o.   MRN: [id number]   1:1 RN Note  D: Pt sitting in dayroom. Pt watched television and stated that she wanted to watch wrestling Kennon Holter is with pt. No signs of distress or discomfort noted.  A: Pt remains on 1:1 for safety.  R: Pt remains safe on the unit.

## 2018-08-23 NOTE — Progress Notes (Signed)
Patient ID: Debbie Dorsey, female   DOB: November 08, 1970, 48 y.o.   MRN: [id number]  CSW contacted pt's grandfather to give him an update on how the patient is doing due to request from grandfather to be updated. Pt's grandfather thanked the staff for everything they are doing.

## 2018-08-23 NOTE — Progress Notes (Signed)
Patient ID: Debbie Dorsey, female   DOB: [birth year], 48 y.o.   MRN: [id number]   1:1 RN Note  D: Pt ambulatory and sitting in the dayroom. Sitter is with pt. No signs of distress or discomfort noted.  A: Pt remains on 1:1 for safety.  R: Pt remains safe on the unit.

## 2018-08-23 NOTE — Progress Notes (Signed)
Recreation Therapy Notes  Date: 7.17.20 Time: 1000 Location: 500 Hall Dayroom  Group Topic:  Goal Setting  Goal Area(s) Addresses:  Patient will be able to identify at least 3 life goals.  Patient will be able to identify benefit of investing in life goals.  Patient will be able to identify benefit of setting life goals.   Behavioral Response:  Engaged  Intervention: Worksheet, pencils  Activity: Goal Planning.  Patients were to set goals they wish to accomplish in a week, month, year and 5 years.  Pt were to also identify any obstacles they could face, what they need to accomplish their goals and what they can start doing now to work towards their goals.  Education:  Discharge Planning, Radiographer, therapeutic, Leisure Education   Education Outcome: Acknowledges Education/In Group Clarification Provided/Needs Additional Education  Clinical Observations:  Pt attempted to answer questions with prompting.  Pt stated in a week- be alright; month- work on goals; year- listen and be ok and in 5 years- would like to be sure.  Pt expressed obstacles would be lack of support after LRT gave pt examples of what obstacle are.  Pt expressed needing someone to talk to in order to achieve goals and expressed she could start now by working on her goals.     Debbie Dorsey, LRT/CTRS     [name], Charrie Mcconnon A 08/23/2018 11:02 AM

## 2018-08-23 NOTE — Tx Team (Signed)
Interdisciplinary Treatment and Diagnostic Plan Update  08/23/2018 Time of Session: 09:06am Debbie Dorsey MRN: [id number]  Principal Diagnosis: <principal problem not specified>  Secondary Diagnoses: Active Problems:   Schizophrenia ([place])   Current Medications:  Current Facility-Administered Medications  Medication Dose Route Frequency Provider Last Rate Last Dose  . acetaminophen (TYLENOL) tablet 650 mg  650 mg Oral Q6H PRN Sharma Covert, MD      . alum & mag hydroxide-simeth (MAALOX/MYLANTA) 200-200-20 MG/5ML suspension 30 mL  30 mL Oral Q4H PRN Sharma Covert, MD      . cloZAPine (CLOZARIL) tablet 75 mg  75 mg Oral QHS Johnn Hai, MD   75 mg at 08/22/18 2054  . feeding supplement (GLUCERNA SHAKE) (GLUCERNA SHAKE) liquid 237 mL  237 mL Oral TID BM Johnn Hai, MD   237 mL at 08/22/18 1400  . gabapentin (NEURONTIN) capsule 200 mg  200 mg Oral TID Sharma Covert, MD   200 mg at 08/23/18 1133  . hydrOXYzine (ATARAX/VISTARIL) tablet 25 mg  25 mg Oral TID PRN Sharma Covert, MD   25 mg at 08/22/18 2054  . insulin aspart (novoLOG) injection 0-20 Units  0-20 Units Subcutaneous TID WC Patriciaann Clan E, PA-C   7 Units at 08/23/18 1140  . insulin aspart (novoLOG) injection 0-5 Units  0-5 Units Subcutaneous QHS Laverle Hobby, PA-C   2 Units at 08/22/18 2055  . insulin aspart (novoLOG) injection 6 Units  6 Units Subcutaneous TID WC Patriciaann Clan E, PA-C   6 Units at 08/23/18 1141  . insulin glargine (LANTUS) injection 22 Units  22 Units Subcutaneous Daily Sharma Covert, MD   22 Units at 08/23/18 [phone]  . LORazepam (ATIVAN) tablet 1 mg  1 mg Oral Q4H PRN Johnn Hai, MD   1 mg at 08/22/18 1208  . magnesium hydroxide (MILK OF MAGNESIA) suspension 30 mL  30 mL Oral Daily PRN Sharma Covert, MD      . perphenazine (TRILAFON) tablet 2 mg  2 mg Oral BID Johnn Hai, MD   2 mg at 08/23/18 0738  . prenatal multivitamin tablet 1 tablet  1 tablet Oral Q1200 Johnn Hai,  MD   1 tablet at 08/23/18 1133  . rivaroxaban (XARELTO) tablet 20 mg  20 mg Oral Daily Johnn Hai, MD   20 mg at 08/22/18 1721  . simvastatin (ZOCOR) tablet 40 mg  40 mg Oral q1800 Sharma Covert, MD   40 mg at 08/22/18 1720  . temazepam (RESTORIL) capsule 15 mg  15 mg Oral QHS Johnn Hai, MD   15 mg at 08/22/18 2054  . traZODone (DESYREL) tablet 50 mg  50 mg Oral QHS PRN Sharma Covert, MD   50 mg at 08/22/18 2054  . ziprasidone (GEODON) injection 10 mg  10 mg Intramuscular Q6H PRN Sharma Covert, MD   10 mg at 08/12/18 8676   PTA Medications: Medications Prior to Admission  Medication Sig Dispense Refill Last Dose  . benztropine (COGENTIN) 0.5 MG tablet Take 1 tablet (0.5 mg total) by mouth 2 (two) times daily. Restart on 06/07/2018 as per psychiatry (Patient not taking: Reported on 07/29/2018)     . benztropine (COGENTIN) 1 MG tablet Take 0.5 mg by mouth 2 (two) times a day.     . busPIRone (BUSPAR) 5 MG tablet Take 5 mg by mouth 3 (three) times daily.     . insulin NPH-regular Human (70-30) 100 UNIT/ML injection Inject 32 Units into  the skin 2 (two) times daily with a meal.     . risperiDONE (RISPERDAL) 0.5 MG tablet Take 1 tablet (0.5 mg total) by mouth 2 (two) times daily. Start on 06/07/2018 as per psychiatry (Patient not taking: Reported on 07/29/2018) 30 tablet 0   . Rivaroxaban (XARELTO) 15 MG TABS tablet Take 15 mg by mouth 2 (two) times daily with a meal.     . Rivaroxaban 15 & 20 MG TBPK Take as directed on package: Start with one 15mg  tablet by mouth twice a day with food. On Day 22, switch to one 20mg  tablet once a day with food. (Patient not taking: Reported on 07/29/2018) 51 each 0   . simvastatin (ZOCOR) 40 MG tablet Take 40 mg by mouth daily at 12 noon.      . temazepam (RESTORIL) 15 MG capsule Take 1 capsule (15 mg total) by mouth at bedtime as needed for sleep. (Patient not taking: Reported on 07/29/2018) 30 capsule 0   . traZODone (DESYREL) 100 MG tablet Take 100 mg  by mouth daily.       Patient Stressors: Health problems Medication change or noncompliance Traumatic event  Patient Strengths: Supportive family/friends  Treatment Modalities: Medication Management, Group therapy, Case management,  1 to 1 session with clinician, Psychoeducation, Recreational therapy.   Physician Treatment Plan for Primary Diagnosis: <principal problem not specified> Long Term Goal(s): Improvement in symptoms so as ready for discharge Improvement in symptoms so as ready for discharge   Short Term Goals: Ability to identify changes in lifestyle to reduce recurrence of condition will improve Ability to verbalize feelings will improve Ability to disclose and discuss suicidal ideas Ability to identify and develop effective coping behaviors will improve Ability to maintain clinical measurements within normal limits will improve Compliance with prescribed medications will improve  Medication Management: Evaluate patient's response, side effects, and tolerance of medication regimen.  Therapeutic Interventions: 1 to 1 sessions, Unit Group sessions and Medication administration.  Evaluation of Outcomes: Progressing  Physician Treatment Plan for Secondary Diagnosis: Active Problems:   Schizophrenia ([name])  Long Term Goal(s): Improvement in symptoms so as ready for discharge Improvement in symptoms so as ready for discharge   Short Term Goals: Ability to identify changes in lifestyle to reduce recurrence of condition will improve Ability to verbalize feelings will improve Ability to disclose and discuss suicidal ideas Ability to identify and develop effective coping behaviors will improve Ability to maintain clinical measurements within normal limits will improve Compliance with prescribed medications will improve     Medication Management: Evaluate patient's response, side effects, and tolerance of medication regimen.  Therapeutic Interventions: 1 to 1 sessions,  Unit Group sessions and Medication administration.  Evaluation of Outcomes: Progressing   RN Treatment Plan for Primary Diagnosis: <principal problem not specified> Long Term Goal(s): Knowledge of disease and therapeutic regimen to maintain health will improve  Short Term Goals: Ability to participate in decision making will improve, Ability to verbalize feelings will improve, Ability to disclose and discuss suicidal ideas, Ability to identify and develop effective coping behaviors will improve and Compliance with prescribed medications will improve  Medication Management: RN will administer medications as ordered by provider, will assess and evaluate patient's response and provide education to patient for prescribed medication. RN will report any adverse and/or side effects to prescribing provider.  Therapeutic Interventions: 1 on 1 counseling sessions, Psychoeducation, Medication administration, Evaluate responses to treatment, Monitor vital signs and CBGs as ordered, Perform/monitor CIWA, COWS, AIMS and Fall Risk  screenings as ordered, Perform wound care treatments as ordered.  Evaluation of Outcomes: Progressing   LCSW Treatment Plan for Primary Diagnosis: <principal problem not specified> Long Term Goal(s): Safe transition to appropriate next level of care at discharge, Engage patient in therapeutic group addressing interpersonal concerns.  Short Term Goals: Engage patient in aftercare planning with referrals and resources and Increase skills for wellness and recovery  Therapeutic Interventions: Assess for all discharge needs, 1 to 1 time with Social worker, Explore available resources and support systems, Assess for adequacy in community support network, Educate family and significant other(s) on suicide prevention, Complete Psychosocial Assessment, Interpersonal group therapy.  Evaluation of Outcomes: Progressing   Progress in Treatment: Attending groups: Yes. Participating in  groups: Yes. Taking medication as prescribed: Yes. Toleration medication: Yes. Family/Significant other contact made: Yes, individual(s) contacted:  pt's grandfather Patient understands diagnosis: No. Discussing patient identified problems/goals with staff: Yes. Medical problems stabilized or resolved: Yes. Denies suicidal/homicidal ideation: Yes. Issues/concerns per patient self-inventory: No. Other:   New problem(s) identified: No, Describe:  None  New Short Term/Long Term Goal(s): Medication stabilization, elimination of SI thoughts, and development of a comprehensive mental wellness plan.   Patient Goals:    Discharge Plan or Barriers: CSW will continue to follow up for appropriate referrals and possible discharge planning. ACTT services with [facility] as a back up until ACTT is started.   Reason for Continuation of Hospitalization: Medication Management  Estimated Length of Stay: 2-3 days  Attendees: Patient: 08/23/2018   Physician: Dr. Johnn Hai, MD 08/23/2018   Nursing: Elberta Fortis, RN 08/23/2018   RN Care Manager: 08/23/2018   Social Worker: Ardelle Anton, LCSW 08/23/2018  Recreational Therapist:  08/23/2018   Other:  08/23/2018  Other:  08/23/2018   Other: 08/23/2018      Scribe for Treatment Team: Trecia Rogers, LCSW 08/23/2018 12:58 PM

## 2018-08-23 NOTE — Progress Notes (Signed)
[facility] NOVEL CORONAVIRUS (COVID-19) DAILY CHECK-OFF SYMPTOMS - answer yes or no to each - every day NO YES  Have you had a fever in the past 24 hours?  . Fever (Temp > 37.80C / 100F) X   Have you had any of these symptoms in the past 24 hours? . New Cough .  Sore Throat  .  Shortness of Breath .  Difficulty Breathing .  Unexplained Body Aches   X   Have you had any one of these symptoms in the past 24 hours not related to allergies?   . Runny Nose .  Nasal Congestion .  Sneezing   X   If you have had runny nose, nasal congestion, sneezing in the past 24 hours, has it worsened?  X   EXPOSURES - check yes or no X   Have you traveled outside the state in the past 14 days?  X   Have you been in contact with someone with a confirmed diagnosis of COVID-19 or PUI in the past 14 days without wearing appropriate PPE?  X   Have you been living in the same home as a person with confirmed diagnosis of COVID-19 or a PUI (household contact)?    X   Have you been diagnosed with COVID-19?    X              What to do next: Answered NO to all: Answered YES to anything:   Proceed with unit schedule Follow the [facility] Inpatient Flowsheet.   

## 2018-08-23 NOTE — Progress Notes (Signed)
[facility] MD Progress Note  08/23/2018 11:29 AM Debbie Dorsey  MRN:  [id number] Subjective:    Showing some improvement continues to give sentence fragments or sentences and does answer appropriately oriented to person place time situation not exact date denies positive symptoms still requires supervision due to elopement risk tries to leave the ward unsupervised. Principal Problem: Exacerbation of underlying psychotic disorder Diagnosis: Active Problems:   Schizophrenia ([place])  Total Time spent with patient: 20 minutes  Past Psychiatric History: see eval  Past Medical History:  Past Medical History:  Diagnosis Date  . Diabetes mellitus without complication (May)    History reviewed. No pertinent surgical history. Family History:  Family History  Problem Relation Age of Onset  . Mental illness Sister    Family Psychiatric  History: see eval Social History:  Social History   Substance and Sexual Activity  Alcohol Use No     Social History   Substance and Sexual Activity  Drug Use No    Social History   Socioeconomic History  . Marital status: Single    Spouse name: Not on file  . Number of children: Not on file  . Years of education: Not on file  . Highest education level: Not on file  Occupational History  . Not on file  Social Needs  . Financial resource strain: Not on file  . Food insecurity    Worry: Not on file    Inability: Not on file  . Transportation needs    Medical: Not on file    Non-medical: Not on file  Tobacco Use  . Smoking status: Never Smoker  . Smokeless tobacco: Never Used  Substance and Sexual Activity  . Alcohol use: No  . Drug use: No  . Sexual activity: Not Currently  Lifestyle  . Physical activity    Days per week: Not on file    Minutes per session: Not on file  . Stress: Not on file  Relationships  . Social Herbalist on phone: Not on file    Gets together: Not on file    Attends religious service: Not on file     Active member of club or organization: Not on file    Attends meetings of clubs or organizations: Not on file    Relationship status: Not on file  Other Topics Concern  . Not on file  Social History Narrative  . Not on file   Additional Social History:                         Sleep: Good  Appetite:  Good  Current Medications: Current Facility-Administered Medications  Medication Dose Route Frequency Provider Last Rate Last Dose  . acetaminophen (TYLENOL) tablet 650 mg  650 mg Oral Q6H PRN Sharma Covert, MD      . alum & mag hydroxide-simeth (MAALOX/MYLANTA) 200-200-20 MG/5ML suspension 30 mL  30 mL Oral Q4H PRN Sharma Covert, MD      . cloZAPine (CLOZARIL) tablet 75 mg  75 mg Oral QHS Johnn Hai, MD   75 mg at 08/22/18 2054  . feeding supplement (GLUCERNA SHAKE) (GLUCERNA SHAKE) liquid 237 mL  237 mL Oral TID BM Johnn Hai, MD   237 mL at 08/22/18 1400  . gabapentin (NEURONTIN) capsule 200 mg  200 mg Oral TID Sharma Covert, MD   200 mg at 08/23/18 0738  . hydrOXYzine (ATARAX/VISTARIL) tablet 25 mg  25 mg Oral  TID PRN Sharma Covert, MD   25 mg at 08/22/18 2054  . insulin aspart (novoLOG) injection 0-20 Units  0-20 Units Subcutaneous TID WC Laverle Hobby, PA-C   11 Units at 08/23/18 9417  . insulin aspart (novoLOG) injection 0-5 Units  0-5 Units Subcutaneous QHS Laverle Hobby, PA-C   2 Units at 08/22/18 2055  . insulin aspart (novoLOG) injection 6 Units  6 Units Subcutaneous TID WC Patriciaann Clan E, PA-C   6 Units at 08/23/18 4081  . insulin glargine (LANTUS) injection 22 Units  22 Units Subcutaneous Daily Sharma Covert, MD   22 Units at 08/23/18 [phone]  . LORazepam (ATIVAN) tablet 1 mg  1 mg Oral Q4H PRN Johnn Hai, MD   1 mg at 08/22/18 1208  . magnesium hydroxide (MILK OF MAGNESIA) suspension 30 mL  30 mL Oral Daily PRN Sharma Covert, MD      . perphenazine (TRILAFON) tablet 2 mg  2 mg Oral BID Johnn Hai, MD   2 mg at 08/23/18 0738   . prenatal multivitamin tablet 1 tablet  1 tablet Oral Q1200 Johnn Hai, MD   1 tablet at 08/22/18 1208  . rivaroxaban (XARELTO) tablet 20 mg  20 mg Oral Daily Johnn Hai, MD   20 mg at 08/22/18 1721  . simvastatin (ZOCOR) tablet 40 mg  40 mg Oral q1800 Sharma Covert, MD   40 mg at 08/22/18 1720  . temazepam (RESTORIL) capsule 15 mg  15 mg Oral QHS Johnn Hai, MD   15 mg at 08/22/18 2054  . traZODone (DESYREL) tablet 50 mg  50 mg Oral QHS PRN Sharma Covert, MD   50 mg at 08/22/18 2054  . ziprasidone (GEODON) injection 10 mg  10 mg Intramuscular Q6H PRN Sharma Covert, MD   10 mg at 08/12/18 8563    Lab Results:  Results for orders placed or performed during the hospital encounter of 07/31/18 (from the past 48 hour(s))  Glucose, capillary     Status: Abnormal   Collection Time: 08/21/18 12:01 PM  Result Value Ref Range   Glucose-Capillary 231 (H) 70 - 99 mg/dL  Glucose, capillary     Status: Abnormal   Collection Time: 08/21/18  5:12 PM  Result Value Ref Range   Glucose-Capillary 278 (H) 70 - 99 mg/dL  Glucose, capillary     Status: Abnormal   Collection Time: 08/21/18  7:38 PM  Result Value Ref Range   Glucose-Capillary 177 (H) 70 - 99 mg/dL  Glucose, capillary     Status: Abnormal   Collection Time: 08/22/18  6:22 AM  Result Value Ref Range   Glucose-Capillary 195 (H) 70 - 99 mg/dL  CBC with Differential/Platelet     Status: Abnormal   Collection Time: 08/22/18  6:38 AM  Result Value Ref Range   WBC 5.9 4.0 - 10.5 K/uL   RBC 3.75 (L) 3.87 - 5.11 MIL/uL   Hemoglobin 12.0 12.0 - 15.0 g/dL   HCT 38.4 36.0 - 46.0 %   MCV 102.4 (H) 80.0 - 100.0 fL   MCH 32.0 26.0 - 34.0 pg   MCHC 31.3 30.0 - 36.0 g/dL   RDW 13.2 11.5 - 15.5 %   Platelets 363 150 - 400 K/uL   nRBC 0.0 0.0 - 0.2 %   Neutrophils Relative % 43 %   Neutro Abs 2.5 1.7 - 7.7 K/uL   Lymphocytes Relative 45 %   Lymphs Abs 2.7 0.7 - 4.0 K/uL  Monocytes Relative 8 %   Monocytes Absolute 0.4 0.1 -  1.0 K/uL   Eosinophils Relative 2 %   Eosinophils Absolute 0.1 0.0 - 0.5 K/uL   Basophils Relative 1 %   Basophils Absolute 0.0 0.0 - 0.1 K/uL   Immature Granulocytes 1 %   Abs Immature Granulocytes 0.03 0.00 - 0.07 K/uL    Comment: Performed at [facility], [place] [address] [place], [place] [address]  Glucose, capillary     Status: Abnormal   Collection Time: 08/22/18 11:59 AM  Result Value Ref Range   Glucose-Capillary 263 (H) 70 - 99 mg/dL  Urinalysis, Complete w Microscopic     Status: Abnormal   Collection Time: 08/22/18  2:30 PM  Result Value Ref Range   Color, Urine YELLOW YELLOW   APPearance CLEAR CLEAR   Specific Gravity, Urine 1.018 1.005 - 1.030   pH 6.0 5.0 - 8.0   Glucose, UA >=500 (A) NEGATIVE mg/dL   Hgb urine dipstick NEGATIVE NEGATIVE   Bilirubin Urine NEGATIVE NEGATIVE   Ketones, ur NEGATIVE NEGATIVE mg/dL   Protein, ur NEGATIVE NEGATIVE mg/dL   Nitrite NEGATIVE NEGATIVE   Leukocytes,Ua NEGATIVE NEGATIVE   RBC / HPF 11-20 0 - 5 RBC/hpf   WBC, UA 0-5 0 - 5 WBC/hpf   Bacteria, UA NONE SEEN NONE SEEN   Squamous Epithelial / LPF 0-5 0 - 5    Comment: Performed at [facility], [place] [address] [place], [place] [address]  Glucose, capillary     Status: Abnormal   Collection Time: 08/22/18  4:53 PM  Result Value Ref Range   Glucose-Capillary 251 (H) 70 - 99 mg/dL   Comment 1 Notify RN    Comment 2 Document in Chart   Glucose, capillary     Status: Abnormal   Collection Time: 08/22/18  7:37 PM  Result Value Ref Range   Glucose-Capillary 221 (H) 70 - 99 mg/dL  Glucose, capillary     Status: Abnormal   Collection Time: 08/23/18  6:11 AM  Result Value Ref Range   Glucose-Capillary 277 (H) 70 - 99 mg/dL    Blood Alcohol level:  Lab Results  Component Value Date   ETH <10 07/28/2018   ETH <10 08/06/1599    Metabolic Disorder Labs: Lab Results  Component Value Date   HGBA1C 8.5 (H) 07/29/2018   MPG 197 07/29/2018    MPG 191.51 05/26/2018   No results found for: PROLACTIN No results found for: CHOL, TRIG, HDL, CHOLHDL, VLDL, LDLCALC  Physical Findings: AIMS: Facial and Oral Movements Muscles of Facial Expression: None, normal Lips and Perioral Area: None, normal Jaw: None, normal Tongue: None, normal,Extremity Movements Upper (arms, wrists, hands, fingers): None, normal Lower (legs, knees, ankles, toes): None, normal, Trunk Movements Neck, shoulders, hips: None, normal, Overall Severity Severity of abnormal movements (highest score from questions above): None, normal Incapacitation due to abnormal movements: None, normal Patient's awareness of abnormal movements (rate only patient's report): No Awareness, Dental Status Current problems with teeth and/or dentures?: No Does patient usually wear dentures?: No  CIWA:  CIWA-Ar Total: 2 COWS:  COWS Total Score: 0  Musculoskeletal: Strength & Muscle Tone: within normal limits Gait & Station: normal Patient leans: N/A  Psychiatric Specialty Exam: Physical Exam  ROS  Blood pressure 105/79, pulse (!) 135, temperature 98.7 F (37.1 C), temperature source Oral, resp. rate 18, height 4\' 11"  (1.499 m), weight 41.3 kg, SpO2 100 %, unknown if currently breastfeeding.Body mass index is  18.38 kg/m.  General Appearance: Casual  Eye Contact:  Good  Speech: Slow and softer at times  Volume:  Normal in general but softer at times  Mood:  Dysphoric  Affect:  Blunt  Thought Process:  Irrelevant and Descriptions of Associations: Loose  Orientation:  Full (Time, Place, and Person)  Thought Content:  Illogical  Suicidal Thoughts:  No  Homicidal Thoughts:  No  Memory:  Immediate;   Fair  Judgement:  Fair  Insight:  Fair  Psychomotor Activity:  Normal  Concentration:  Concentration: Fair  Recall:  [employer] of Knowledge:  Fair  Language:  Fair  Akathisia:  Negative  Handed:  Right  AIMS (if indicated):     Assets:  Physical Health Resilience  ADL's:   Intact  Cognition:  WNL  Sleep:  Number of Hours: 6.25     Treatment Plan Summary: Daily contact with patient to assess and evaluate symptoms and progress in treatment and Medication management  Continue current monitoring continue combination antipsychotic therapy no change in current precautions.  Probable discharge early next week  Violeta Lecount, MD 08/23/2018, 11:29 AM

## 2018-08-23 NOTE — Progress Notes (Signed)
Nursing CO Note  D: Pt observed resting in bed with eyes closed. Respirations even and unlabored. No distress noted. Earlier this shift, Pt was interacting with staff. Pt was also sitting in dayroom watching TV. Pt took scheduled meds at bedtime. Pt was seen eating and drinking Glucerna. Support offered.  A: CO observation continues for safety.  R: Pt remains safe.

## 2018-08-23 NOTE — Plan of Care (Signed)
  Problem: Activity: Goal: Interest or engagement in activities will improve Outcome: Progressing   Problem: Coping: Goal: Ability to demonstrate self-control will improve Outcome: Progressing   

## 2018-08-23 NOTE — Progress Notes (Signed)
Nursing 1:1 note D:Pt observed sitting in bed with eyes open. RR even and unlabored. No distress noted. A: 1:1 observation continues for safety  R: pt remains safe

## 2018-08-23 NOTE — Progress Notes (Signed)
Patient ID: Debbie Dorsey, female   DOB: Nov 04, 1970, 48 y.o.   MRN: [id number]   [place] NOVEL CORONAVIRUS (COVID-19) DAILY CHECK-OFF SYMPTOMS - answer yes or no to each - every day NO YES  Have you had a fever in the past 24 hours?  . Fever (Temp > 37.80C / 100F) X   Have you had any of these symptoms in the past 24 hours? . New Cough .  Sore Throat  .  Shortness of Breath .  Difficulty Breathing .  Unexplained Body Aches   X   Have you had any one of these symptoms in the past 24 hours not related to allergies?   . Runny Nose .  Nasal Congestion .  Sneezing   X   If you have had runny nose, nasal congestion, sneezing in the past 24 hours, has it worsened?  X   EXPOSURES - check yes or no X   Have you traveled outside the state in the past 14 days?  X   Have you been in contact with someone with a confirmed diagnosis of COVID-19 or PUI in the past 14 days without wearing appropriate PPE?  X   Have you been living in the same home as a person with confirmed diagnosis of COVID-19 or a PUI (household contact)?    X   Have you been diagnosed with COVID-19?    X              What to do next: Answered NO to all: Answered YES to anything:   Proceed with unit schedule Follow the [facility] Inpatient Flowsheet.

## 2018-08-23 NOTE — Progress Notes (Addendum)
Patient ID: Debbie Dorsey, female   DOB: 01/30/1971, 48 y.o.   MRN: [id number]  1:1 RN Note  D: Pt ambulatory in the hallway and dayroom. Pt ate breakfast and took medications. Pt also attended group this morning. Sitter is with pt. No signs of distress or discomfort noted.  A: Pt remains on close observation for safety.  R: Pt remains safe on the unit.

## 2018-08-24 LAB — GLUCOSE, CAPILLARY
Glucose-Capillary: 182 mg/dL — ABNORMAL HIGH (ref 70–99)
Glucose-Capillary: 181 mg/dL — ABNORMAL HIGH (ref 70–99)
Glucose-Capillary: 242 mg/dL — ABNORMAL HIGH (ref 70–99)
Glucose-Capillary: 196 mg/dL — ABNORMAL HIGH (ref 70–99)

## 2018-08-24 MED ORDER — INSULIN GLARGINE 100 UNIT/ML ~~LOC~~ SOLN
30.0000 [IU] | Freq: Every day | SUBCUTANEOUS | Status: DC
  Administered 2018-08-25: 30 [IU] via SUBCUTANEOUS

## 2018-08-24 NOTE — Plan of Care (Signed)
  Problem: Education: Goal: Mental status will improve Outcome: Progressing   Problem: Coping: Goal: Ability to demonstrate self-control will improve Outcome: Progressing

## 2018-08-24 NOTE — Progress Notes (Signed)
Patient ID: Debbie Dorsey, female   DOB: 1970/12/30, 48 y.o.   MRN: [id number]   1:1 RN Note  D: Pt ate dinner while sitting in the day room. Every few minutes pt repeatedly asked, "Who is gonna be with me tonight?" Sitter respondeded that another sitter will be with her tonight but didn't know who it would be. Pt continues to watch television. Sitter with pt. No signs of distress or discomfort noted.  A: Close observation remains for safety. R: Pt remains safe on the unit.

## 2018-08-24 NOTE — Progress Notes (Signed)
Patient ID: Debbie Dorsey, female   DOB: 08-Nov-1970, 48 y.o.   MRN: [id number]   1:1 RN Note  D: Pt ambulatory in hallway and at nurse's station. Pt sat in dayroom with sitter and watched television. No signs of distress or discomfort noted.  A: Close observation remains for safety. R: Pt remains safe on the unit.

## 2018-08-24 NOTE — Progress Notes (Signed)
Nursing CO Note  D: Pt observed resting in bed with eyes closed. Respirations even and unlabored. No distress noted.  A: CO observation continues for safety.  R: Pt remains safe.

## 2018-08-24 NOTE — Progress Notes (Signed)
[facility] MD Progress Note  08/24/2018 1:38 PM Debbie Dorsey  MRN:  [id number] Subjective: Patient is a 48 year old female with a past psychiatric history significant for schizophrenia who was originally admitted on 08/01/2018.  Objective: Patient is seen and examined.  Patient is a 48 year old female with a past psychiatric history significant for schizophrenia who is seen in follow-up.  I have not seen her personally in 2 weeks, and although she is significantly better she is still quite ill.  She continues to respond to auditory hallucinations.  She is only alert and oriented really to 1 today.  She is eating better, and her most recent lab results were all essentially normal.  She is also had a CT scan of the brain since last visit that was all essentially normal.  Her blood sugar this morning is 181.  I had increased her Lantus insulin last p.m.  Her vital signs are stable although she is still tachycardic.  Her rate this morning was 136.  She did sleep 6.75 hours last night.  She remains on Clozaril 75 mg p.o. nightly, gabapentin 200 mg p.o. 3 times daily, lorazepam as needed, her Lantus insulin that was increased to 28 units subcu nightly yesterday, temazepam as well as trazodone.  When she was in the medical hospital she had a significant urinary tract infection, and her urinalysis was repeated on 7/16.  It was essentially normal except for greater than 500 mg per DL of glucose.  She denied suicidal ideation.  Principal Problem: <principal problem not specified> Diagnosis: Active Problems:   Schizophrenia ([place])  Total Time spent with patient: 20 minutes  Past Psychiatric History: See admission H&P  Past Medical History:  Past Medical History:  Diagnosis Date  . Diabetes mellitus without complication ([place])    History reviewed. No pertinent surgical history. Family History:  Family History  Problem Relation Age of Onset  . Mental illness Sister    Family Psychiatric  History: See  admission H&P Social History:  Social History   Substance and Sexual Activity  Alcohol Use No     Social History   Substance and Sexual Activity  Drug Use No    Social History   Socioeconomic History  . Marital status: Single    Spouse name: Not on file  . Number of children: Not on file  . Years of education: Not on file  . Highest education level: Not on file  Occupational History  . Not on file  Social Needs  . Financial resource strain: Not on file  . Food insecurity    Worry: Not on file    Inability: Not on file  . Transportation needs    Medical: Not on file    Non-medical: Not on file  Tobacco Use  . Smoking status: Never Smoker  . Smokeless tobacco: Never Used  Substance and Sexual Activity  . Alcohol use: No  . Drug use: No  . Sexual activity: Not Currently  Lifestyle  . Physical activity    Days per week: Not on file    Minutes per session: Not on file  . Stress: Not on file  Relationships  . Social Herbalist on phone: Not on file    Gets together: Not on file    Attends religious service: Not on file    Active member of club or organization: Not on file    Attends meetings of clubs or organizations: Not on file    Relationship status: Not on  file  Other Topics Concern  . Not on file  Social History Narrative  . Not on file   Additional Social History:                         Sleep: Good  Appetite:  Fair  Current Medications: Current Facility-Administered Medications  Medication Dose Route Frequency Provider Last Rate Last Dose  . acetaminophen (TYLENOL) tablet 650 mg  650 mg Oral Q6H PRN Sharma Covert, MD      . alum & mag hydroxide-simeth (MAALOX/MYLANTA) 200-200-20 MG/5ML suspension 30 mL  30 mL Oral Q4H PRN Sharma Covert, MD      . cloZAPine (CLOZARIL) tablet 75 mg  75 mg Oral QHS Johnn Hai, MD   75 mg at 08/23/18 2047  . feeding supplement (GLUCERNA SHAKE) (GLUCERNA SHAKE) liquid 237 mL  237 mL Oral  TID BM Johnn Hai, MD   237 mL at 08/24/18 0923  . gabapentin (NEURONTIN) capsule 200 mg  200 mg Oral TID Sharma Covert, MD   200 mg at 08/24/18 1221  . hydrOXYzine (ATARAX/VISTARIL) tablet 25 mg  25 mg Oral TID PRN Sharma Covert, MD   25 mg at 08/23/18 2047  . insulin aspart (novoLOG) injection 0-20 Units  0-20 Units Subcutaneous TID WC Patriciaann Clan E, PA-C   4 Units at 08/24/18 1222  . insulin aspart (novoLOG) injection 0-5 Units  0-5 Units Subcutaneous QHS Laverle Hobby, PA-C   Stopped at 08/23/18 2048  . insulin aspart (novoLOG) injection 6 Units  6 Units Subcutaneous TID WC Patriciaann Clan E, PA-C   6 Units at 08/24/18 1222  . insulin glargine (LANTUS) injection 28 Units  28 Units Subcutaneous Daily Sharma Covert, MD   28 Units at 08/24/18 [phone]  . LORazepam (ATIVAN) tablet 1 mg  1 mg Oral Q4H PRN Johnn Hai, MD   1 mg at 08/24/18 1014  . magnesium hydroxide (MILK OF MAGNESIA) suspension 30 mL  30 mL Oral Daily PRN Sharma Covert, MD      . perphenazine (TRILAFON) tablet 2 mg  2 mg Oral BID Johnn Hai, MD   2 mg at 08/24/18 3329  . prenatal multivitamin tablet 1 tablet  1 tablet Oral Q1200 Johnn Hai, MD   1 tablet at 08/24/18 1222  . rivaroxaban (XARELTO) tablet 20 mg  20 mg Oral Daily Johnn Hai, MD   20 mg at 08/23/18 1752  . simvastatin (ZOCOR) tablet 40 mg  40 mg Oral q1800 Sharma Covert, MD   40 mg at 08/23/18 1752  . temazepam (RESTORIL) capsule 15 mg  15 mg Oral QHS Johnn Hai, MD   15 mg at 08/23/18 2047  . traZODone (DESYREL) tablet 50 mg  50 mg Oral QHS PRN Sharma Covert, MD   50 mg at 08/23/18 2047  . ziprasidone (GEODON) injection 10 mg  10 mg Intramuscular Q6H PRN Sharma Covert, MD   10 mg at 08/12/18 5188    Lab Results:  Results for orders placed or performed during the hospital encounter of 07/31/18 (from the past 48 hour(s))  Urinalysis, Complete w Microscopic     Status: Abnormal   Collection Time: 08/22/18  2:30 PM   Result Value Ref Range   Color, Urine YELLOW YELLOW   APPearance CLEAR CLEAR   Specific Gravity, Urine 1.018 1.005 - 1.030   pH 6.0 5.0 - 8.0   Glucose, UA >=500 (A) NEGATIVE mg/dL  Hgb urine dipstick NEGATIVE NEGATIVE   Bilirubin Urine NEGATIVE NEGATIVE   Ketones, ur NEGATIVE NEGATIVE mg/dL   Protein, ur NEGATIVE NEGATIVE mg/dL   Nitrite NEGATIVE NEGATIVE   Leukocytes,Ua NEGATIVE NEGATIVE   RBC / HPF 11-20 0 - 5 RBC/hpf   WBC, UA 0-5 0 - 5 WBC/hpf   Bacteria, UA NONE SEEN NONE SEEN   Squamous Epithelial / LPF 0-5 0 - 5    Comment: Performed at [facility], [place] [address], [place], [place] [address]  Glucose, capillary     Status: Abnormal   Collection Time: 08/22/18  4:53 PM  Result Value Ref Range   Glucose-Capillary 251 (H) 70 - 99 mg/dL   Comment 1 Notify RN    Comment 2 Document in Chart   Glucose, capillary     Status: Abnormal   Collection Time: 08/22/18  7:37 PM  Result Value Ref Range   Glucose-Capillary 221 (H) 70 - 99 mg/dL  Glucose, capillary     Status: Abnormal   Collection Time: 08/23/18  6:11 AM  Result Value Ref Range   Glucose-Capillary 277 (H) 70 - 99 mg/dL  Glucose, capillary     Status: Abnormal   Collection Time: 08/23/18 11:31 AM  Result Value Ref Range   Glucose-Capillary 238 (H) 70 - 99 mg/dL  Glucose, capillary     Status: Abnormal   Collection Time: 08/23/18  5:09 PM  Result Value Ref Range   Glucose-Capillary 286 (H) 70 - 99 mg/dL  Glucose, capillary     Status: Abnormal   Collection Time: 08/23/18  7:20 PM  Result Value Ref Range   Glucose-Capillary 193 (H) 70 - 99 mg/dL  Glucose, capillary     Status: Abnormal   Collection Time: 08/24/18  5:39 AM  Result Value Ref Range   Glucose-Capillary 182 (H) 70 - 99 mg/dL  Glucose, capillary     Status: Abnormal   Collection Time: 08/24/18 12:11 PM  Result Value Ref Range   Glucose-Capillary 181 (H) 70 - 99 mg/dL    Blood Alcohol level:  Lab Results  Component Value Date    ETH <10 07/28/2018   ETH <10 07/62/2633    Metabolic Disorder Labs: Lab Results  Component Value Date   HGBA1C 8.5 (H) 07/29/2018   MPG 197 07/29/2018   MPG 191.51 05/26/2018   No results found for: PROLACTIN No results found for: CHOL, TRIG, HDL, CHOLHDL, VLDL, LDLCALC  Physical Findings: AIMS: Facial and Oral Movements Muscles of Facial Expression: None, normal Lips and Perioral Area: None, normal Jaw: None, normal Tongue: None, normal,Extremity Movements Upper (arms, wrists, hands, fingers): None, normal Lower (legs, knees, ankles, toes): None, normal, Trunk Movements Neck, shoulders, hips: None, normal, Overall Severity Severity of abnormal movements (highest score from questions above): None, normal Incapacitation due to abnormal movements: None, normal Patient's awareness of abnormal movements (rate only patient's report): No Awareness, Dental Status Current problems with teeth and/or dentures?: No Does patient usually wear dentures?: No  CIWA:  CIWA-Ar Total: 2 COWS:  COWS Total Score: 0  Musculoskeletal: Strength & Muscle Tone: within normal limits Gait & Station: unsteady Patient leans: N/A  Psychiatric Specialty Exam: Physical Exam  Nursing note and vitals reviewed. Constitutional: She appears well-developed and well-nourished.  HENT:  Head: Normocephalic and atraumatic.  Respiratory: Effort normal.  Neurological: She is alert.    ROS  Blood pressure 110/66, pulse (!) 136, temperature 98.7 F (37.1 C), temperature source Oral, resp. rate 18, height 4\' 11"  (  1.499 m), weight 41.3 kg, SpO2 100 %, unknown if currently breastfeeding.Body mass index is 18.38 kg/m.  General Appearance: Casual  Eye Contact:  Fair  Speech:  Normal Rate  Volume:  Decreased  Mood:  Anxious and Dysphoric  Affect:  Congruent  Thought Process:  Goal Directed and Descriptions of Associations: Circumstantial  Orientation:  Negative  Thought Content:  Delusions and Hallucinations:  Auditory  Suicidal Thoughts:  No  Homicidal Thoughts:  No  Memory:  Immediate;   Poor Recent;   Poor Remote;   Poor  Judgement:  Intact  Insight:  Fair  Psychomotor Activity:  Increased  Concentration:  Concentration: Poor and Attention Span: Poor  Recall:  Poor  Fund of Knowledge:  Fair  Language:  Fair  Akathisia:  Negative  Handed:  Right  AIMS (if indicated):     Assets:  Desire for Improvement Resilience  ADL's:  Impaired  Cognition:  Impaired,  Mild  Sleep:  Number of Hours: 6.75     Treatment Plan Summary: Daily contact with patient to assess and evaluate symptoms and progress in treatment, Medication management and Plan : Patient is seen and examined.  Patient is a 48 year old female with the above-stated past psychiatric history who is seen in follow-up.   Diagnosis: #1 schizophrenia, #2 diabetes mellitus type 2, #3 glucosuria, #4 hyperlipidemia, #5 chronic anticoagulation   Patient is seen in follow-up.  She is improved, but still cognitively impaired as well is still appearing to responding to auditory hallucinations.  She continues on clozapine as well as Trilafon.  She is looking better but slowly getting better.  Her metabolic work-up so far is negative.  No change in her psychiatric medications.  Her blood sugar is still a bit elevated, and I am going to increase her Lantus to 30 units subcu daily.  No other changes in her medications. 1.  Continue Clozaril 75 mg p.o. nightly for schizophrenia. 2.  Continue gabapentin 200 mg p.o. 3 times daily for anxiety. 3.  Continue hydroxyzine 25 mg p.o. 3 times daily as needed anxiety. 4.  Continue sliding scale insulin at meals. 5.  Increase Lantus insulin to 30 units subcu daily.  This is for diabetes mellitus. 6.  Continue lorazepam 1 mg p.o. every 4 hours as needed anxiety. 7.  Continue Trilafon 2 mg p.o. twice daily for psychosis. 8.  Continue Xarelto 20 mg for anticoagulation. 9.  Continue simvastatin 40 mg p.o. daily  for hyperlipidemia. 10.  Continue Restoril 15 mg p.o. nightly for insomnia. 11.  Disposition planning-in progress.  Sharma Covert, MD 08/24/2018, 1:38 PM

## 2018-08-24 NOTE — Progress Notes (Signed)
Nursing CO Note  D: Pt observed resting in bed with eyes closed. Respirations even and unlabored. No distress noted. Earlier this shift, Pt was interacting with staff. Pt was also sitting in dayroom watching TV. Pt took scheduled meds at bedtime. Pt was seen eating and drinking Glucerna. CBG at bedtime was 196. Support offered.  A: CO observation continues for safety.  R: Pt remains safe.

## 2018-08-24 NOTE — Progress Notes (Signed)
The focus of this group is to help patients establish daily goals to achieve during treatment and discuss how the patient can incorporate goal setting into their daily lives to aide in recovery. 

## 2018-08-24 NOTE — Progress Notes (Signed)
NursingCO Note  D:Pt observedresting in bed with eyes open. CBG this a.m was 182. Pt urinated in bed. Pt was clean up with assistance from sitter.  A:COobservation continues for safety. R:Pt remains safe.

## 2018-08-24 NOTE — [facility] Group Notes (Signed)
  [facility] LCSW Group Therapy Note  Date/Time:  08/24/2018 11:15AM-12:00PM  Type of Therapy and Topic:  Group Therapy:  Feelings About Hospitalization  Participation Level:  Group was deferred for outdoor recreation due to the weather forecast  Description of Group This process group involved patients discussing their feelings related to being hospitalized, as well as the benefits they see to being in the hospital.  These feelings and benefits were itemized.  The group then brainstormed specific ways in which they could seek those same benefits when they discharge and return home.  Therapeutic Goals 1. Patient will identify and describe positive and negative feelings related to hospitalization 2. Patient will verbalize benefits of hospitalization to themselves personally 3. Patients will brainstorm together ways they can obtain similar benefits in the outpatient setting, identify barriers to wellness and possible solutions  Summary of Patient Progress:  N.A  Therapeutic Modalities Cognitive Behavioral Therapy Motivational Interviewing    Selmer Dominion, LCSW 08/24/2018, 8:55 AM

## 2018-08-24 NOTE — Progress Notes (Signed)
[place] NOVEL CORONAVIRUS (COVID-19) DAILY CHECK-OFF SYMPTOMS - answer yes or no to each - every day NO YES  Have you had a fever in the past 24 hours?  . Fever (Temp > 37.80C / 100F) X   Have you had any of these symptoms in the past 24 hours? . New Cough .  Sore Throat  .  Shortness of Breath .  Difficulty Breathing .  Unexplained Body Aches   X   Have you had any one of these symptoms in the past 24 hours not related to allergies?   . Runny Nose .  Nasal Congestion .  Sneezing   X   If you have had runny nose, nasal congestion, sneezing in the past 24 hours, has it worsened?  X   EXPOSURES - check yes or no X   Have you traveled outside the state in the past 14 days?  X   Have you been in contact with someone with a confirmed diagnosis of COVID-19 or PUI in the past 14 days without wearing appropriate PPE?  X   Have you been living in the same home as a person with confirmed diagnosis of COVID-19 or a PUI (household contact)?    X   Have you been diagnosed with COVID-19?    X              What to do next: Answered NO to all: Answered YES to anything:   Proceed with unit schedule Follow the [facility] Inpatient Flowsheet.   

## 2018-08-24 NOTE — Progress Notes (Signed)
Patient ID: Debbie Dorsey, female   DOB: 14-Apr-1970, 48 y.o.   MRN: [id number]   1:1 RN Note  D: Pt sat on her bed in her room then sat in the dayroom watching a movie with other pts. Sitter with pt. No signs of distress or discomfort noted.  A: Close observation remains for safety. R: Pt remains safe on the unit.

## 2018-08-24 NOTE — Progress Notes (Signed)
Patient ID: Debbie Dorsey, female   DOB: 1970/07/11, 49 y.o.   MRN: [id number]   [name] NOVEL CORONAVIRUS (COVID-19) DAILY CHECK-OFF SYMPTOMS - answer yes or no to each - every day NO YES  Have you had a fever in the past 24 hours?  . Fever (Temp > 37.80C / 100F) X   Have you had any of these symptoms in the past 24 hours? . New Cough .  Sore Throat  .  Shortness of Breath .  Difficulty Breathing .  Unexplained Body Aches   X   Have you had any one of these symptoms in the past 24 hours not related to allergies?   . Runny Nose .  Nasal Congestion .  Sneezing   X   If you have had runny nose, nasal congestion, sneezing in the past 24 hours, has it worsened?  X   EXPOSURES - check yes or no X   Have you traveled outside the state in the past 14 days?  X   Have you been in contact with someone with a confirmed diagnosis of COVID-19 or PUI in the past 14 days without wearing appropriate PPE?  X   Have you been living in the same home as a person with confirmed diagnosis of COVID-19 or a PUI (household contact)?    X   Have you been diagnosed with COVID-19?    X              What to do next: Answered NO to all: Answered YES to anything:   Proceed with unit schedule Follow the [facility] Inpatient Flowsheet.

## 2018-08-25 ENCOUNTER — Other Ambulatory Visit: Payer: Self-pay

## 2018-08-25 LAB — GLUCOSE, CAPILLARY
Glucose-Capillary: 195 mg/dL — ABNORMAL HIGH (ref 70–99)
Glucose-Capillary: 333 mg/dL — ABNORMAL HIGH (ref 70–99)
Glucose-Capillary: 205 mg/dL — ABNORMAL HIGH (ref 70–99)
Glucose-Capillary: 265 mg/dL — ABNORMAL HIGH (ref 70–99)

## 2018-08-25 MED ORDER — INSULIN GLARGINE 100 UNIT/ML ~~LOC~~ SOLN
34.0000 [IU] | Freq: Every day | SUBCUTANEOUS | Status: DC
  Administered 2018-08-26 – 2018-08-27 (×2): 34 [IU] via SUBCUTANEOUS

## 2018-08-25 NOTE — Progress Notes (Signed)
NursingCO Note  D:Pt observedresting in bed with eyes closed. Respirations even and unlabored. No distress noted.  A:COobservation continues for safety. R:Pt remains safe.

## 2018-08-25 NOTE — [facility] Group Notes (Signed)
[facility] LCSW Group Therapy Note  Date/Time:  08/25/2018  11:15AM-12:00PM  Type of Therapy and Topic:  Group Therapy:  Music and Mood  Participation Level:  Did Not Attend - Group was deferred for outdoor reaction time due to the weather.  Description of Group: In this process group, members listened to a variety of genres of music and identified that different types of music evoke different responses.  Patients were encouraged to identify music that was soothing for them and music that was energizing for them.  Patients discussed how this knowledge can help with wellness and recovery in various ways including managing depression and anxiety as well as encouraging healthy sleep habits.    Therapeutic Goals: 1. Patients will explore the impact of different varieties of music on mood 2. Patients will verbalize the thoughts they have when listening to different types of music 3. Patients will identify music that is soothing to them as well as music that is energizing to them 4. Patients will discuss how to use this knowledge to assist in maintaining wellness and recovery 5. Patients will explore the use of music as a coping skill  Summary of Patient Progress:  N/A  Therapeutic Modalities: Solution Focused Brief Therapy Activity   Selmer Dominion, LCSW

## 2018-08-25 NOTE — Progress Notes (Signed)
NursingCO Note  D:Pt observedresting in bed with eyes closed. Respirations even and unlabored. No distress noted. A:COobservation continues for safety. R:Pt remains safe.

## 2018-08-25 NOTE — Progress Notes (Signed)
Patient ID: Debbie Dorsey, female   DOB: 25-Oct-1970, 48 y.o.   MRN: [id number]  [place] NOVEL CORONAVIRUS (COVID-19) DAILY CHECK-OFF SYMPTOMS - answer yes or no to each - every day NO YES  Have you had a fever in the past 24 hours?  . Fever (Temp > 37.80C / 100F) X   Have you had any of these symptoms in the past 24 hours? . New Cough .  Sore Throat  .  Shortness of Breath .  Difficulty Breathing .  Unexplained Body Aches   X   Have you had any one of these symptoms in the past 24 hours not related to allergies?   . Runny Nose .  Nasal Congestion .  Sneezing   X   If you have had runny nose, nasal congestion, sneezing in the past 24 hours, has it worsened?  X   EXPOSURES - check yes or no X   Have you traveled outside the state in the past 14 days?  X   Have you been in contact with someone with a confirmed diagnosis of COVID-19 or PUI in the past 14 days without wearing appropriate PPE?  X   Have you been living in the same home as a person with confirmed diagnosis of COVID-19 or a PUI (household contact)?    X   Have you been diagnosed with COVID-19?    X              What to do next: Answered NO to all: Answered YES to anything:   Proceed with unit schedule Follow the [facility] Inpatient Flowsheet.

## 2018-08-25 NOTE — Progress Notes (Signed)
Patient ID: Debbie Dorsey, female   DOB: 1970/10/03, 48 y.o.   MRN: [id number]   1:1 RN Note  D: Pt laying on her bed with eyes open. Pt ate about 60% of her breakfast before sitting in the dayroom. Sitter is with pt. No distress or discomfort noted.  A: Close observation continues for safety of pt.  R: Pt remains safe on the unit.

## 2018-08-25 NOTE — Progress Notes (Signed)
[facility] MD Progress Note  08/25/2018 10:20 AM Debbie Dorsey  MRN:  [id number] Subjective:  Patient is a 48 year old female with a past psychiatric history significant for schizophrenia who was originally admitted on 08/01/2018.  Objective: Patient is seen and examined.  Patient is a 48 year old female with a past psychiatric history significant for schizophrenia who is seen in follow-up.  She is essentially unchanged from yesterday.  She is up and awake this morning, and walking around.  She still is at times confused.  She still at times continues to appear to respond to internal stimuli.  She remains tachycardic this morning with a rate of 133.  Blood pressure is stable.  She is afebrile.  She slept 6 hours last night.  Her last EKG was on 6/24 and it had a sinus tachycardia with a rate at approximately 113.  Her blood sugar this morning was 195.  Principal Problem: <principal problem not specified> Diagnosis: Active Problems:   Schizophrenia ([place])  Total Time spent with patient: 15 minutes  Past Psychiatric History: See admission H&P  Past Medical History:  Past Medical History:  Diagnosis Date  . Diabetes mellitus without complication ([place])    History reviewed. No pertinent surgical history. Family History:  Family History  Problem Relation Age of Onset  . Mental illness Sister    Family Psychiatric  History: See admission H&P Social History:  Social History   Substance and Sexual Activity  Alcohol Use No     Social History   Substance and Sexual Activity  Drug Use No    Social History   Socioeconomic History  . Marital status: Single    Spouse name: Not on file  . Number of children: Not on file  . Years of education: Not on file  . Highest education level: Not on file  Occupational History  . Not on file  Social Needs  . Financial resource strain: Not on file  . Food insecurity    Worry: Not on file    Inability: Not on file  . Transportation needs   Medical: Not on file    Non-medical: Not on file  Tobacco Use  . Smoking status: Never Smoker  . Smokeless tobacco: Never Used  Substance and Sexual Activity  . Alcohol use: No  . Drug use: No  . Sexual activity: Not Currently  Lifestyle  . Physical activity    Days per week: Not on file    Minutes per session: Not on file  . Stress: Not on file  Relationships  . Social Herbalist on phone: Not on file    Gets together: Not on file    Attends religious service: Not on file    Active member of club or organization: Not on file    Attends meetings of clubs or organizations: Not on file    Relationship status: Not on file  Other Topics Concern  . Not on file  Social History Narrative  . Not on file   Additional Social History:                         Sleep: Good  Appetite:  Fair  Current Medications: Current Facility-Administered Medications  Medication Dose Route Frequency Provider Last Rate Last Dose  . acetaminophen (TYLENOL) tablet 650 mg  650 mg Oral Q6H PRN Sharma Covert, MD      . alum & mag hydroxide-simeth (MAALOX/MYLANTA) 200-200-20 MG/5ML suspension 30 mL  30 mL Oral Q4H PRN Sharma Covert, MD      . cloZAPine (CLOZARIL) tablet 75 mg  75 mg Oral QHS Johnn Hai, MD   75 mg at 08/24/18 2034  . feeding supplement (GLUCERNA SHAKE) (GLUCERNA SHAKE) liquid 237 mL  237 mL Oral TID BM Johnn Hai, MD   237 mL at 08/24/18 2036  . gabapentin (NEURONTIN) capsule 200 mg  200 mg Oral TID Sharma Covert, MD   200 mg at 08/25/18 0800  . hydrOXYzine (ATARAX/VISTARIL) tablet 25 mg  25 mg Oral TID PRN Sharma Covert, MD   25 mg at 08/25/18 1006  . insulin aspart (novoLOG) injection 0-20 Units  0-20 Units Subcutaneous TID WC Patriciaann Clan E, PA-C   4 Units at 08/25/18 0615  . insulin aspart (novoLOG) injection 0-5 Units  0-5 Units Subcutaneous QHS Laverle Hobby, PA-C   Stopped at 08/23/18 2048  . insulin aspart (novoLOG) injection 6 Units   6 Units Subcutaneous TID WC Patriciaann Clan E, PA-C   6 Units at 08/25/18 [phone]  . insulin glargine (LANTUS) injection 30 Units  30 Units Subcutaneous Daily Sharma Covert, MD   30 Units at 08/25/18 0800  . LORazepam (ATIVAN) tablet 1 mg  1 mg Oral Q4H PRN Johnn Hai, MD   1 mg at 08/25/18 1006  . magnesium hydroxide (MILK OF MAGNESIA) suspension 30 mL  30 mL Oral Daily PRN Sharma Covert, MD      . perphenazine (TRILAFON) tablet 2 mg  2 mg Oral BID Johnn Hai, MD   2 mg at 08/25/18 0800  . prenatal multivitamin tablet 1 tablet  1 tablet Oral Q1200 Johnn Hai, MD   1 tablet at 08/24/18 1222  . rivaroxaban (XARELTO) tablet 20 mg  20 mg Oral Daily Johnn Hai, MD   20 mg at 08/24/18 1714  . simvastatin (ZOCOR) tablet 40 mg  40 mg Oral q1800 Sharma Covert, MD   40 mg at 08/24/18 1714  . temazepam (RESTORIL) capsule 15 mg  15 mg Oral QHS Johnn Hai, MD   15 mg at 08/24/18 2034  . traZODone (DESYREL) tablet 50 mg  50 mg Oral QHS PRN Sharma Covert, MD   50 mg at 08/24/18 2034  . ziprasidone (GEODON) injection 10 mg  10 mg Intramuscular Q6H PRN Sharma Covert, MD   10 mg at 08/12/18 9678    Lab Results:  Results for orders placed or performed during the hospital encounter of 07/31/18 (from the past 48 hour(s))  Glucose, capillary     Status: Abnormal   Collection Time: 08/23/18 11:31 AM  Result Value Ref Range   Glucose-Capillary 238 (H) 70 - 99 mg/dL  Glucose, capillary     Status: Abnormal   Collection Time: 08/23/18  5:09 PM  Result Value Ref Range   Glucose-Capillary 286 (H) 70 - 99 mg/dL  Glucose, capillary     Status: Abnormal   Collection Time: 08/23/18  7:20 PM  Result Value Ref Range   Glucose-Capillary 193 (H) 70 - 99 mg/dL  Glucose, capillary     Status: Abnormal   Collection Time: 08/24/18  5:39 AM  Result Value Ref Range   Glucose-Capillary 182 (H) 70 - 99 mg/dL  Glucose, capillary     Status: Abnormal   Collection Time: 08/24/18 12:11 PM  Result  Value Ref Range   Glucose-Capillary 181 (H) 70 - 99 mg/dL  Glucose, capillary     Status: Abnormal  Collection Time: 08/24/18  4:57 PM  Result Value Ref Range   Glucose-Capillary 242 (H) 70 - 99 mg/dL  Glucose, capillary     Status: Abnormal   Collection Time: 08/24/18  7:26 PM  Result Value Ref Range   Glucose-Capillary 196 (H) 70 - 99 mg/dL  Glucose, capillary     Status: Abnormal   Collection Time: 08/25/18  5:51 AM  Result Value Ref Range   Glucose-Capillary 195 (H) 70 - 99 mg/dL    Blood Alcohol level:  Lab Results  Component Value Date   ETH <10 07/28/2018   ETH <10 59/56/3875    Metabolic Disorder Labs: Lab Results  Component Value Date   HGBA1C 8.5 (H) 07/29/2018   MPG 197 07/29/2018   MPG 191.51 05/26/2018   No results found for: PROLACTIN No results found for: CHOL, TRIG, HDL, CHOLHDL, VLDL, LDLCALC  Physical Findings: AIMS: Facial and Oral Movements Muscles of Facial Expression: None, normal Lips and Perioral Area: None, normal Jaw: None, normal Tongue: None, normal,Extremity Movements Upper (arms, wrists, hands, fingers): None, normal Lower (legs, knees, ankles, toes): None, normal, Trunk Movements Neck, shoulders, hips: None, normal, Overall Severity Severity of abnormal movements (highest score from questions above): None, normal Incapacitation due to abnormal movements: None, normal Patient's awareness of abnormal movements (rate only patient's report): No Awareness, Dental Status Current problems with teeth and/or dentures?: No Does patient usually wear dentures?: No  CIWA:  CIWA-Ar Total: 2 COWS:  COWS Total Score: 0  Musculoskeletal: Strength & Muscle Tone: within normal limits Gait & Station: normal Patient leans: N/A  Psychiatric Specialty Exam: Physical Exam  Nursing note and vitals reviewed. Constitutional: She is oriented to person, place, and time. She appears well-developed and well-nourished.  HENT:  Head: Normocephalic and  atraumatic.  Respiratory: Effort normal.  Neurological: She is alert and oriented to person, place, and time.    ROS  Blood pressure 104/74, pulse (!) 133, temperature 98.4 F (36.9 C), resp. rate 18, height 4\' 11"  (1.499 m), weight 41.3 kg, SpO2 100 %, unknown if currently breastfeeding.Body mass index is 18.38 kg/m.  General Appearance: Casual  Eye Contact:  Fair  Speech:  Pressured  Volume:  Normal  Mood:  Anxious and Dysphoric  Affect:  Congruent  Thought Process:  Disorganized and Descriptions of Associations: Loose  Orientation:  Negative  Thought Content:  Delusions and Hallucinations: Auditory  Suicidal Thoughts:  No  Homicidal Thoughts:  No  Memory:  Immediate;   Fair Recent;   Fair Remote;   Fair  Judgement:  Intact  Insight:  Fair  Psychomotor Activity:  Increased  Concentration:  Concentration: Fair and Attention Span: Fair  Recall:  [employer] of Knowledge:  Fair  Language:  Fair  Akathisia:  Negative  Handed:  Right  AIMS (if indicated):     Assets:  Desire for Improvement Resilience  ADL's:  Impaired  Cognition:  Impaired,  Mild  Sleep:  Number of Hours: 6     Treatment Plan Summary: Daily contact with patient to assess and evaluate symptoms and progress in treatment, Medication management and Plan : Patient is seen and examined.  Patient is a 48 year old female with the above-stated past psychiatric history who is seen in follow-up.   Diagnosis: #1 schizophrenia, #2 diabetes mellitus type 2, #3 glucosuria, #4 hyperlipidemia, #5 chronic anticoagulation, #6 tachycardia  Patient seen in follow-up.  She is essentially unchanged from yesterday.  She is more tachycardic.  And her blood sugar is still  elevated.  I am considering increasing her Clozaril, but I will hold off for now.  I am going to increase her Lantus insulin to 32 units subcu daily.  I am also going to get an EKG given the tachycardia.  This may be secondary to the Clozaril and I just want to make  sure.  No other change in her medications at this point. 1.  Continue Clozaril 75 mg p.o. nightly for schizophrenia. 2.  Continue gabapentin 200 mg p.o. 3 times daily for anxiety. 3.  Continue hydroxyzine 25 mg p.o. 3 times daily as needed anxiety. 4.  Continue sliding scale insulin at meals. 5.  Increase Lantus insulin to 34 units subcu daily.  This is for diabetes mellitus. 6.  Continue lorazepam 1 mg p.o. every 4 hours as needed anxiety. 7.  Continue Trilafon 2 mg p.o. twice daily for psychosis. 8.  Continue Xarelto 20 mg for anticoagulation. 9.  Continue simvastatin 40 mg p.o. daily for hyperlipidemia. 10.  Continue Restoril 15 mg p.o. nightly for insomnia. 11.    EKG this a.m. for tachycardia. 12.  Disposition planning-in progress. Sharma Covert, MD 08/25/2018, 10:20 AM

## 2018-08-25 NOTE — Progress Notes (Signed)
Patient ID: Debbie Dorsey, female   DOB: 1970/07/02, 48 y.o.   MRN: [id number]   1:1 RN Note  D: Pt sat in the dayroom watching television. Pt attempting to follow staff off the unit and was redirected several times by her sitter. Pt redirectable. No distress or discomfort noted.  A: Close observation continues for safety of pt.  R: Pt remains safe on the unit.

## 2018-08-25 NOTE — Progress Notes (Signed)
Patient ID: Debbie Dorsey, female   DOB: 01/10/1971, 48 y.o.   MRN: [id number]   1:1 RN Note  D:Pt laying in bed with eyes closed with even and unlabored respirations. Sitter with pt. No distress or discomfort noted.  A: Close observation continues for safety of pt.  R: Pt remains safe on the unit.

## 2018-08-25 NOTE — Progress Notes (Signed)
Patient ID: Debbie Dorsey, female   DOB: 07/05/1970, 48 y.o.   MRN: [id number]    1:1 RN Note  D: Pt ate dinner and sat in the day room with her sitter. Pt was talkative this evening with sitter and RN. No distress or discomfort noted.  A: Close observation continues for safety of pt.  R: Pt remains safe on the unit.

## 2018-08-25 NOTE — Progress Notes (Signed)
Patient ID: Debbie Dorsey, female   DOB: 1970-05-29, 48 y.o.   MRN: [id number]   [place] NOVEL CORONAVIRUS (COVID-19) DAILY CHECK-OFF SYMPTOMS - answer yes or no to each - every day NO YES  Have you had a fever in the past 24 hours?  . Fever (Temp > 37.80C / 100F) X   Have you had any of these symptoms in the past 24 hours? . New Cough .  Sore Throat  .  Shortness of Breath .  Difficulty Breathing .  Unexplained Body Aches   X   Have you had any one of these symptoms in the past 24 hours not related to allergies?   . Runny Nose .  Nasal Congestion .  Sneezing   X   If you have had runny nose, nasal congestion, sneezing in the past 24 hours, has it worsened?  X   EXPOSURES - check yes or no X   Have you traveled outside the state in the past 14 days?  X   Have you been in contact with someone with a confirmed diagnosis of COVID-19 or PUI in the past 14 days without wearing appropriate PPE?  X   Have you been living in the same home as a person with confirmed diagnosis of COVID-19 or a PUI (household contact)?    X   Have you been diagnosed with COVID-19?    X              What to do next: Answered NO to all: Answered YES to anything:   Proceed with unit schedule Follow the [facility] Inpatient Flowsheet.

## 2018-08-26 LAB — CBC WITH DIFFERENTIAL/PLATELET
Abs Immature Granulocytes: 0.04 10*3/uL (ref 0.00–0.07)
Basophils Absolute: 0.1 10*3/uL (ref 0.0–0.1)
Basophils Relative: 1 %
Eosinophils Absolute: 0.1 10*3/uL (ref 0.0–0.5)
Eosinophils Relative: 1 %
HCT: 39.4 % (ref 36.0–46.0)
Hemoglobin: 12.3 g/dL (ref 12.0–15.0)
Immature Granulocytes: 0 %
Lymphocytes Relative: 29 %
Lymphs Abs: 3.1 10*3/uL (ref 0.7–4.0)
MCH: 32.4 pg (ref 26.0–34.0)
MCHC: 31.2 g/dL (ref 30.0–36.0)
MCV: 103.7 fL — ABNORMAL HIGH (ref 80.0–100.0)
Monocytes Absolute: 0.5 10*3/uL (ref 0.1–1.0)
Monocytes Relative: 5 %
Neutro Abs: 6.7 10*3/uL (ref 1.7–7.7)
Neutrophils Relative %: 64 %
Platelets: 402 10*3/uL — ABNORMAL HIGH (ref 150–400)
RBC: 3.8 MIL/uL — ABNORMAL LOW (ref 3.87–5.11)
RDW: 13.7 % (ref 11.5–15.5)
WBC: 10.5 10*3/uL (ref 4.0–10.5)
nRBC: 0 % (ref 0.0–0.2)

## 2018-08-26 LAB — GLUCOSE, CAPILLARY
Glucose-Capillary: 241 mg/dL — ABNORMAL HIGH (ref 70–99)
Glucose-Capillary: 211 mg/dL — ABNORMAL HIGH (ref 70–99)
Glucose-Capillary: 308 mg/dL — ABNORMAL HIGH (ref 70–99)
Glucose-Capillary: 249 mg/dL — ABNORMAL HIGH (ref 70–99)

## 2018-08-26 MED ORDER — LORAZEPAM 2 MG/ML IJ SOLN
1.0000 mg | INTRAMUSCULAR | Status: DC | PRN
  Administered 2018-08-26: 1 mg via INTRAMUSCULAR
  Filled 2018-08-26: qty 1

## 2018-08-26 MED ORDER — LORAZEPAM 2 MG/ML IJ SOLN
INTRAMUSCULAR | Status: AC
Start: 2018-08-26 — End: 2018-08-26
  Administered 2018-08-26: 1 mg via INTRAMUSCULAR
  Filled 2018-08-26: qty 1

## 2018-08-26 MED ORDER — TEMAZEPAM 15 MG PO CAPS
15.0000 mg | ORAL_CAPSULE | Freq: Every evening | ORAL | Status: DC | PRN
  Administered 2018-08-26 – 2018-08-27 (×2): 15 mg via ORAL
  Filled 2018-08-26 (×2): qty 1

## 2018-08-26 MED ORDER — LORAZEPAM 0.5 MG PO TABS: 0.5000 mg | ORAL_TABLET | ORAL | Status: DC | PRN

## 2018-08-26 MED ORDER — CLOZAPINE 25 MG PO TABS
50.0000 mg | ORAL_TABLET | Freq: Every day | ORAL | Status: DC
  Administered 2018-08-26 – 2018-08-27 (×2): 50 mg via ORAL
  Filled 2018-08-26 (×4): qty 2

## 2018-08-26 MED ORDER — PERPHENAZINE 4 MG PO TABS
4.0000 mg | ORAL_TABLET | Freq: Every day | ORAL | Status: DC
  Administered 2018-08-27: 4 mg via ORAL
  Filled 2018-08-26 (×2): qty 1

## 2018-08-26 NOTE — Progress Notes (Signed)
Recreation Therapy Notes  Date: 08/26/2018 Time: 10:00 am Location: [address] hall   Group Topic: Anger Thermometer  Goal Area(s) Addresses:  Patient will work on Academic librarian. Patient will follow directions on first prompt.  Behavioral Response: Appropriate  Intervention: Worksheet  Activity:  Staff on [address] hall were provided with a worksheet on Anger Thermometer. Staff was instructed to give it to the patients and have them work on it in place of [place]. Staff was also given 2 coloring sheets and 2 word searches and were given the option to give them out.  Education:  Ability to follow Directions, Change of thought processes Discharge Planning, Goal Planning.   Education Outcome: Acknowledges education/In group clarification offered  Clinical Observations/Feedback: . Due to COVID-19, guidelines group was not held. Group members were provided a learning activity worksheet to work on the topic and above-stated goals. LRT is available to answer any questions patient may have regarding the worksheet.  Tomi Likens, LRT/CTRS         Lucie Friedlander L Tenita Cue 08/26/2018 10:14 AM

## 2018-08-26 NOTE — Progress Notes (Signed)
1:1 note Pt in bed sleeping with sitter in room Observations continues for patient safety Pt remains safe on unit

## 2018-08-26 NOTE — Progress Notes (Signed)
[facility] MD Progress Note  08/26/2018 10:25 AM Debbie Dorsey  MRN:  [id number] Subjective:    Patient alert oriented to person place general situation guesses at the month day so forth can be reoriented denies hallucinations denies thoughts of harming self or others remains in a rather confused state of psychosis without acute thoughts of harming self or others without EPS or TD  Principal Problem:  Diagnosis: Active Problems:   Schizophrenia ([place])  Total Time spent with patient: 20 minutes  Past Medical History:  Past Medical History:  Diagnosis Date  . Diabetes mellitus without complication ([place])    History reviewed. No pertinent surgical history. Family History:  Family History  Problem Relation Age of Onset  . Mental illness Sister    Social History:  Social History   Substance and Sexual Activity  Alcohol Use No     Social History   Substance and Sexual Activity  Drug Use No    Social History   Socioeconomic History  . Marital status: Single    Spouse name: Not on file  . Number of children: Not on file  . Years of education: Not on file  . Highest education level: Not on file  Occupational History  . Not on file  Social Needs  . Financial resource strain: Not on file  . Food insecurity    Worry: Not on file    Inability: Not on file  . Transportation needs    Medical: Not on file    Non-medical: Not on file  Tobacco Use  . Smoking status: Never Smoker  . Smokeless tobacco: Never Used  Substance and Sexual Activity  . Alcohol use: No  . Drug use: No  . Sexual activity: Not Currently  Lifestyle  . Physical activity    Days per week: Not on file    Minutes per session: Not on file  . Stress: Not on file  Relationships  . Social Herbalist on phone: Not on file    Gets together: Not on file    Attends religious service: Not on file    Active member of club or organization: Not on file    Attends meetings of clubs or organizations: Not  on file    Relationship status: Not on file  Other Topics Concern  . Not on file  Social History Narrative  . Not on file   Additional Social History:                         Sleep: Good  Appetite:  Good  Current Medications: Current Facility-Administered Medications  Medication Dose Route Frequency Provider Last Rate Last Dose  . acetaminophen (TYLENOL) tablet 650 mg  650 mg Oral Q6H PRN Sharma Covert, MD      . alum & mag hydroxide-simeth (MAALOX/MYLANTA) 200-200-20 MG/5ML suspension 30 mL  30 mL Oral Q4H PRN Sharma Covert, MD      . cloZAPine (CLOZARIL) tablet 50 mg  50 mg Oral QHS Johnn Hai, MD      . feeding supplement (GLUCERNA SHAKE) (GLUCERNA SHAKE) liquid 237 mL  237 mL Oral TID BM Johnn Hai, MD   237 mL at 08/24/18 2036  . gabapentin (NEURONTIN) capsule 200 mg  200 mg Oral TID Sharma Covert, MD   200 mg at 08/26/18 0759  . hydrOXYzine (ATARAX/VISTARIL) tablet 25 mg  25 mg Oral TID PRN Sharma Covert, MD   25 mg  at 08/25/18 1006  . insulin aspart (novoLOG) injection 0-20 Units  0-20 Units Subcutaneous TID WC Patriciaann Clan E, PA-C   7 Units at 08/26/18 3354  . insulin aspart (novoLOG) injection 0-5 Units  0-5 Units Subcutaneous QHS Laverle Hobby, PA-C   3 Units at 08/25/18 2117  . insulin aspart (novoLOG) injection 6 Units  6 Units Subcutaneous TID WC Patriciaann Clan E, PA-C   6 Units at 08/26/18 5625  . insulin glargine (LANTUS) injection 34 Units  34 Units Subcutaneous Daily Sharma Covert, MD   34 Units at 08/26/18 [id number]  . LORazepam (ATIVAN) tablet 0.5 mg  0.5 mg Oral Q4H PRN Johnn Hai, MD      . magnesium hydroxide (MILK OF MAGNESIA) suspension 30 mL  30 mL Oral Daily PRN Sharma Covert, MD      . Derrill Memo ON 08/27/2018] perphenazine (TRILAFON) tablet 4 mg  4 mg Oral QHS Johnn Hai, MD      . prenatal multivitamin tablet 1 tablet  1 tablet Oral Q1200 Johnn Hai, MD   1 tablet at 08/25/18 1234  . rivaroxaban (XARELTO) tablet  20 mg  20 mg Oral Daily Johnn Hai, MD   20 mg at 08/25/18 1724  . simvastatin (ZOCOR) tablet 40 mg  40 mg Oral q1800 Sharma Covert, MD   40 mg at 08/25/18 1723  . temazepam (RESTORIL) capsule 15 mg  15 mg Oral QHS PRN Johnn Hai, MD      . traZODone (DESYREL) tablet 50 mg  50 mg Oral QHS PRN Sharma Covert, MD   50 mg at 08/24/18 2034  . ziprasidone (GEODON) injection 10 mg  10 mg Intramuscular Q6H PRN Sharma Covert, MD   10 mg at 08/25/18 1125    Lab Results:  Results for orders placed or performed during the hospital encounter of 07/31/18 (from the past 48 hour(s))  Glucose, capillary     Status: Abnormal   Collection Time: 08/24/18 12:11 PM  Result Value Ref Range   Glucose-Capillary 181 (H) 70 - 99 mg/dL  Glucose, capillary     Status: Abnormal   Collection Time: 08/24/18  4:57 PM  Result Value Ref Range   Glucose-Capillary 242 (H) 70 - 99 mg/dL  Glucose, capillary     Status: Abnormal   Collection Time: 08/24/18  7:26 PM  Result Value Ref Range   Glucose-Capillary 196 (H) 70 - 99 mg/dL  Glucose, capillary     Status: Abnormal   Collection Time: 08/25/18  5:51 AM  Result Value Ref Range   Glucose-Capillary 195 (H) 70 - 99 mg/dL  Glucose, capillary     Status: Abnormal   Collection Time: 08/25/18 12:04 PM  Result Value Ref Range   Glucose-Capillary 333 (H) 70 - 99 mg/dL  Glucose, capillary     Status: Abnormal   Collection Time: 08/25/18  5:01 PM  Result Value Ref Range   Glucose-Capillary 205 (H) 70 - 99 mg/dL  Glucose, capillary     Status: Abnormal   Collection Time: 08/25/18  7:16 PM  Result Value Ref Range   Glucose-Capillary 265 (H) 70 - 99 mg/dL  Glucose, capillary     Status: Abnormal   Collection Time: 08/26/18  5:56 AM  Result Value Ref Range   Glucose-Capillary 241 (H) 70 - 99 mg/dL  CBC with Differential/Platelet     Status: Abnormal   Collection Time: 08/26/18  6:43 AM  Result Value Ref Range   WBC 10.5 4.0 -  10.5 K/uL   RBC 3.80 (L)  3.87 - 5.11 MIL/uL   Hemoglobin 12.3 12.0 - 15.0 g/dL   HCT 39.4 36.0 - 46.0 %   MCV 103.7 (H) 80.0 - 100.0 fL   MCH 32.4 26.0 - 34.0 pg   MCHC 31.2 30.0 - 36.0 g/dL   RDW 13.7 11.5 - 15.5 %   Platelets 402 (H) 150 - 400 K/uL   nRBC 0.0 0.0 - 0.2 %   Neutrophils Relative % 64 %   Neutro Abs 6.7 1.7 - 7.7 K/uL   Lymphocytes Relative 29 %   Lymphs Abs 3.1 0.7 - 4.0 K/uL   Monocytes Relative 5 %   Monocytes Absolute 0.5 0.1 - 1.0 K/uL   Eosinophils Relative 1 %   Eosinophils Absolute 0.1 0.0 - 0.5 K/uL   Basophils Relative 1 %   Basophils Absolute 0.1 0.0 - 0.1 K/uL   Immature Granulocytes 0 %   Abs Immature Granulocytes 0.04 0.00 - 0.07 K/uL    Comment: Performed at [facility], [place] [address], [place], [place] [address]    Blood Alcohol level:  Lab Results  Component Value Date   ETH <10 07/28/2018   ETH <10 74/16/3845    Metabolic Disorder Labs: Lab Results  Component Value Date   HGBA1C 8.5 (H) 07/29/2018   MPG 197 07/29/2018   MPG 191.51 05/26/2018   No results found for: PROLACTIN No results found for: CHOL, TRIG, HDL, CHOLHDL, VLDL, LDLCALC  Physical Findings: AIMS: Facial and Oral Movements Muscles of Facial Expression: None, normal Lips and Perioral Area: None, normal Jaw: None, normal Tongue: None, normal,Extremity Movements Upper (arms, wrists, hands, fingers): None, normal Lower (legs, knees, ankles, toes): None, normal, Trunk Movements Neck, shoulders, hips: None, normal, Overall Severity Severity of abnormal movements (highest score from questions above): None, normal Incapacitation due to abnormal movements: None, normal Patient's awareness of abnormal movements (rate only patient's report): No Awareness, Dental Status Current problems with teeth and/or dentures?: No Does patient usually wear dentures?: No  CIWA:  CIWA-Ar Total: 2 COWS:  COWS Total Score: 0  Musculoskeletal: Strength & Muscle Tone: within normal limits Gait &  Station: normal Patient leans: N/A  Psychiatric Specialty Exam: Physical Exam  ROS  Blood pressure 104/80, pulse (!) 141, temperature (!) 97.4 F (36.3 C), resp. rate 18, height 4\' 11"  (1.499 m), weight 41.3 kg, SpO2 100 %, unknown if currently breastfeeding.Body mass index is 18.38 kg/m.  General Appearance: Casual  Eye Contact:  Minimal  Speech:  Garbled  Volume:  Decreased  Mood:  Dysphoric  Affect:  Blunt and Congruent  Thought Process:  Goal Directed and Descriptions of Associations: Circumstantial  Orientation:  Full (Time, Place, and Person) but unaware of day date and time can be reoriented to July  Thought Content:  Illogical and Tangential  Suicidal Thoughts:  No  Homicidal Thoughts:  No  Memory:  Immediate;   Fair  Judgement:  Fair  Insight:  Fair  Psychomotor Activity:  Decreased  Concentration:  Attention Span: Fair  Recall:  [employer] of Knowledge:  Fair  Language:  Fair  Akathisia:  Negative  Handed:  Right  AIMS (if indicated):     Assets:  Communication Skills Intimacy  ADL's:  Intact  Cognition:  WNL  Sleep:  Number of Hours: 6.75     Treatment Plan Summary: Daily contact with patient to assess and evaluate symptoms and progress in treatment and Medication management continue but lower clozapine  shift Restoril to as needed shift Trilafon to bedtime only continue cognitive therapy showing some improvement but never fully oriented and denying all positive symptoms 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

## 2018-08-26 NOTE — Progress Notes (Signed)
Adult Psychoeducational Group Note  Date:  08/26/2018 Time:  12:19 AM  Group Topic/Focus:  Wrap-Up Group:   The focus of this group is to help patients review their daily goal of treatment and discuss progress on daily workbooks.  Participation Level:  Minimal  Participation Quality:  Appropriate  Affect:  Appropriate  Cognitive:  Appropriate  Insight: Limited  Engagement in Group:  Engaged  Modes of Intervention:  Discussion  Additional Comments: Pt stated her goal for today was to interact more with peers and staff. Pt stated she felt like she accomplished her goal today. Pt rated her day 10 out of 10. Pt stated she was starting to feel better today and she notice her appetite improving. Pt stated she talked to her nurse about her medication and the plan is for her to get another good nights rest.   Candy Sledge 08/26/2018, 12:19 AM

## 2018-08-26 NOTE — Progress Notes (Signed)
1:1 note  D: Pt in bed sleeping.  Sitter with pt. A: observation continues for patient safety. R: Pt remains safe on unit

## 2018-08-26 NOTE — Progress Notes (Signed)
Patient being monitored on 1:1 due to behaving in a confused manner.  Patient is oriented to self but not time and place.  Patient needs constant direction to perform ADLs.  Patient is noted to be anxious at times.  Patient remains safe with the assistance of 1:1 staff.

## 2018-08-26 NOTE — Progress Notes (Signed)
Adult Psychoeducational Group Note  Date:  08/26/2018 Time:  9:18 PM  Group Topic/Focus:  Wrap-Up Group:   The focus of this group is to help patients review their daily goal of treatment and discuss progress on daily workbooks.  Participation Level:  Active  Participation Quality:  Appropriate  Affect:  Appropriate  Cognitive:  Appropriate  Insight: Appropriate  Engagement in Group:  Engaged  Modes of Intervention:  Discussion  Additional Comments:  Patient attend group and said she did not know how she felt. Her goal for today was to feel good. She did not have any plan of hurting herself or anyone else.   Braelin Costlow W Loel Betancur 08/31/2033, 9:18 PM

## 2018-08-26 NOTE — Progress Notes (Signed)
Nursing Progress Note: 7p-7a D: Pt currently presents with a depressed/sad/flat affect and behavior. Interacting minimally with the milieu. Pt reports good sleep during the previous night with current medication regimen.  A: Pt provided with medications per providers orders. Pt's labs and vitals were monitored throughout the night. Pt supported emotionally and encouraged to express concerns and questions. Pt educated on medications.  R: Pt's safety ensured with 15 minute and environmental checks. Pt currently denies SI, HI, and AVH. Pt verbally contracts to seek staff if SI,HI, or AVH occurs and to consult with staff before acting on any harmful thoughts. Will continue to monitor.   [place] NOVEL CORONAVIRUS (COVID-19) DAILY CHECK-OFF SYMPTOMS - answer yes or no to each - every day NO YES  Have you had a fever in the past 24 hours?  . Fever (Temp > 37.80C / 100F) X   Have you had any of these symptoms in the past 24 hours? . New Cough .  Sore Throat  .  Shortness of Breath .  Difficulty Breathing .  Unexplained Body Aches   X   Have you had any one of these symptoms in the past 24 hours not related to allergies?   . Runny Nose .  Nasal Congestion .  Sneezing   X   If you have had runny nose, nasal congestion, sneezing in the past 24 hours, has it worsened?  X   EXPOSURES - check yes or no X   Have you traveled outside the state in the past 14 days?  X   Have you been in contact with someone with a confirmed diagnosis of COVID-19 or PUI in the past 14 days without wearing appropriate PPE?  X   Have you been living in the same home as a person with confirmed diagnosis of COVID-19 or a PUI (household contact)?    X   Have you been diagnosed with COVID-19?    X              What to do next: Answered NO to all: Answered YES to anything:   Proceed with unit schedule Follow the [facility] Inpatient Flowsheet.

## 2018-08-27 LAB — GLUCOSE, CAPILLARY
Glucose-Capillary: 139 mg/dL — ABNORMAL HIGH (ref 70–99)
Glucose-Capillary: 299 mg/dL — ABNORMAL HIGH (ref 70–99)
Glucose-Capillary: 249 mg/dL — ABNORMAL HIGH (ref 70–99)
Glucose-Capillary: 156 mg/dL — ABNORMAL HIGH (ref 70–99)

## 2018-08-27 LAB — SEDIMENTATION RATE: Sed Rate: 34 mm/hr — ABNORMAL HIGH (ref 0–22)

## 2018-08-27 LAB — C-REACTIVE PROTEIN: CRP: 1.5 mg/dL — ABNORMAL HIGH (ref <1.0)

## 2018-08-27 MED ORDER — GABAPENTIN 300 MG PO CAPS
300.0000 mg | ORAL_CAPSULE | Freq: Three times a day (TID) | ORAL | Status: DC
  Administered 2018-08-27 – 2018-08-28 (×2): 300 mg via ORAL
  Filled 2018-08-27 (×6): qty 1

## 2018-08-27 MED ORDER — METOPROLOL SUCCINATE ER 25 MG PO TB24
25.0000 mg | ORAL_TABLET | Freq: Every day | ORAL | Status: DC
  Administered 2018-08-27 – 2018-08-28 (×2): 25 mg via ORAL
  Filled 2018-08-27 (×5): qty 1

## 2018-08-27 MED ORDER — INSULIN ASPART 100 UNIT/ML ~~LOC~~ SOLN
8.0000 [IU] | Freq: Three times a day (TID) | SUBCUTANEOUS | Status: DC
  Administered 2018-08-27 – 2018-08-28 (×2): 8 [IU] via SUBCUTANEOUS

## 2018-08-27 MED ORDER — INSULIN GLARGINE 100 UNIT/ML ~~LOC~~ SOLN
38.0000 [IU] | Freq: Every day | SUBCUTANEOUS | Status: DC
  Administered 2018-08-28: 38 [IU] via SUBCUTANEOUS

## 2018-08-27 NOTE — Progress Notes (Signed)
[facility] MD Progress Note  08/27/2018 3:21 PM Debbie Dorsey  MRN:  [id number] Subjective:  Patient is a 48 year old female with a past psychiatric history significant for schizophrenia who was originally admitted on 08/01/2018.  Objective: Patient is seen and examined.  Patient is a 48 year old female with a past psychiatric history significant for schizophrenia who is seen in follow-up.  She seems slightly better than this weekend.  She was oriented x3 today which was better than Sunday.  She still has periods of anxiety and agitation.  She has continued with tachycardia up to a rate of approximately 130.  Her EKG showed sinus tachycardia with no evidence of ischemia or other finding.  I was concerned about the possibility of myocarditis secondary to Clozaril, and ordered a CRP and sedimentation rate.  Her CRP was elevated at 1.5.  This is only slightly abnormal.  Her sedimentation rate was only 34.  Review of her previous laboratories showed that her eosinophil count on 7/20 was within the normal limits.  It does not appear that her tachycardia is secondary to myocarditis at least at this point.  She is not having any fevers or anything else suggestive of myocarditis outside of her tachycardia.  She remains afebrile.  Her blood pressure stable.  She is still tachycardic with a rate of 124.  She slept 6.75 hours last night.  She still continues to be distractible, and at times physically agitated.  She paces sometimes.  She denied any auditory or visual hallucinations, and this morning as stated above was oriented.  Her clozapine was reduced yesterday to 50 mg, and she remains on Neurontin 200 mg p.o. 3 times daily with Trilafon 4 mg nightly.  Her blood sugar remains poor.  Her glucose this morning was 299.  Her current Lantus dose is 34 units, and she is taking also NovoLog 6 units subcu 3 times daily with food.  Principal Problem: <principal problem not specified> Diagnosis: Active Problems:  Schizophrenia ([place])  Total Time spent with patient: 20 minutes  Past Psychiatric History: See admission H&P  Past Medical History:  Past Medical History:  Diagnosis Date  . Diabetes mellitus without complication ([place])    History reviewed. No pertinent surgical history. Family History:  Family History  Problem Relation Age of Onset  . Mental illness Sister    Family Psychiatric  History: See admission H&P Social History:  Social History   Substance and Sexual Activity  Alcohol Use No     Social History   Substance and Sexual Activity  Drug Use No    Social History   Socioeconomic History  . Marital status: Single    Spouse name: Not on file  . Number of children: Not on file  . Years of education: Not on file  . Highest education level: Not on file  Occupational History  . Not on file  Social Needs  . Financial resource strain: Not on file  . Food insecurity    Worry: Not on file    Inability: Not on file  . Transportation needs    Medical: Not on file    Non-medical: Not on file  Tobacco Use  . Smoking status: Never Smoker  . Smokeless tobacco: Never Used  Substance and Sexual Activity  . Alcohol use: No  . Drug use: No  . Sexual activity: Not Currently  Lifestyle  . Physical activity    Days per week: Not on file    Minutes per session: Not on file  .  Stress: Not on file  Relationships  . Social Herbalist on phone: Not on file    Gets together: Not on file    Attends religious service: Not on file    Active member of club or organization: Not on file    Attends meetings of clubs or organizations: Not on file    Relationship status: Not on file  Other Topics Concern  . Not on file  Social History Narrative  . Not on file   Additional Social History:                         Sleep: Fair  Appetite:  Good  Current Medications: Current Facility-Administered Medications  Medication Dose Route Frequency Provider Last Rate  Last Dose  . acetaminophen (TYLENOL) tablet 650 mg  650 mg Oral Q6H PRN Sharma Covert, MD      . alum & mag hydroxide-simeth (MAALOX/MYLANTA) 200-200-20 MG/5ML suspension 30 mL  30 mL Oral Q4H PRN Sharma Covert, MD      . cloZAPine (CLOZARIL) tablet 50 mg  50 mg Oral QHS Johnn Hai, MD   50 mg at 08/26/18 2017  . feeding supplement (GLUCERNA SHAKE) (GLUCERNA SHAKE) liquid 237 mL  237 mL Oral TID BM Johnn Hai, MD   237 mL at 08/27/18 1453  . gabapentin (NEURONTIN) capsule 200 mg  200 mg Oral TID Sharma Covert, MD   200 mg at 08/27/18 1203  . hydrOXYzine (ATARAX/VISTARIL) tablet 25 mg  25 mg Oral TID PRN Sharma Covert, MD   25 mg at 08/26/18 2017  . insulin aspart (novoLOG) injection 0-20 Units  0-20 Units Subcutaneous TID WC Patriciaann Clan E, PA-C   11 Units at 08/27/18 1204  . insulin aspart (novoLOG) injection 0-5 Units  0-5 Units Subcutaneous QHS Laverle Hobby, PA-C   2 Units at 08/26/18 2019  . insulin aspart (novoLOG) injection 6 Units  6 Units Subcutaneous TID WC Patriciaann Clan E, PA-C   6 Units at 08/27/18 1203  . insulin glargine (LANTUS) injection 34 Units  34 Units Subcutaneous Daily Sharma Covert, MD   34 Units at 08/27/18 0809  . LORazepam (ATIVAN) injection 1 mg  1 mg Intramuscular Q4H PRN Johnn Hai, MD   1 mg at 08/26/18 1234  . LORazepam (ATIVAN) tablet 0.5 mg  0.5 mg Oral Q4H PRN Johnn Hai, MD      . magnesium hydroxide (MILK OF MAGNESIA) suspension 30 mL  30 mL Oral Daily PRN Sharma Covert, MD      . perphenazine (TRILAFON) tablet 4 mg  4 mg Oral QHS Johnn Hai, MD      . prenatal multivitamin tablet 1 tablet  1 tablet Oral Q1200 Johnn Hai, MD   1 tablet at 08/27/18 1203  . rivaroxaban (XARELTO) tablet 20 mg  20 mg Oral Daily Johnn Hai, MD   20 mg at 08/26/18 1824  . simvastatin (ZOCOR) tablet 40 mg  40 mg Oral q1800 Sharma Covert, MD   40 mg at 08/26/18 1824  . temazepam (RESTORIL) capsule 15 mg  15 mg Oral QHS PRN Johnn Hai, MD   15 mg at 08/26/18 2017  . traZODone (DESYREL) tablet 50 mg  50 mg Oral QHS PRN Sharma Covert, MD   50 mg at 08/26/18 2017  . ziprasidone (GEODON) injection 10 mg  10 mg Intramuscular Q6H PRN Sharma Covert, MD   10  mg at 08/25/18 1125    Lab Results:  Results for orders placed or performed during the hospital encounter of 07/31/18 (from the past 48 hour(s))  Glucose, capillary     Status: Abnormal   Collection Time: 08/25/18  5:01 PM  Result Value Ref Range   Glucose-Capillary 205 (H) 70 - 99 mg/dL  Glucose, capillary     Status: Abnormal   Collection Time: 08/25/18  7:16 PM  Result Value Ref Range   Glucose-Capillary 265 (H) 70 - 99 mg/dL  Glucose, capillary     Status: Abnormal   Collection Time: 08/26/18  5:56 AM  Result Value Ref Range   Glucose-Capillary 241 (H) 70 - 99 mg/dL  CBC with Differential/Platelet     Status: Abnormal   Collection Time: 08/26/18  6:43 AM  Result Value Ref Range   WBC 10.5 4.0 - 10.5 K/uL   RBC 3.80 (L) 3.87 - 5.11 MIL/uL   Hemoglobin 12.3 12.0 - 15.0 g/dL   HCT 39.4 36.0 - 46.0 %   MCV 103.7 (H) 80.0 - 100.0 fL   MCH 32.4 26.0 - 34.0 pg   MCHC 31.2 30.0 - 36.0 g/dL   RDW 13.7 11.5 - 15.5 %   Platelets 402 (H) 150 - 400 K/uL   nRBC 0.0 0.0 - 0.2 %   Neutrophils Relative % 64 %   Neutro Abs 6.7 1.7 - 7.7 K/uL   Lymphocytes Relative 29 %   Lymphs Abs 3.1 0.7 - 4.0 K/uL   Monocytes Relative 5 %   Monocytes Absolute 0.5 0.1 - 1.0 K/uL   Eosinophils Relative 1 %   Eosinophils Absolute 0.1 0.0 - 0.5 K/uL   Basophils Relative 1 %   Basophils Absolute 0.1 0.0 - 0.1 K/uL   Immature Granulocytes 0 %   Abs Immature Granulocytes 0.04 0.00 - 0.07 K/uL    Comment: Performed at [facility], [place] [address], [place], [place] [address]  Glucose, capillary     Status: Abnormal   Collection Time: 08/26/18 12:04 PM  Result Value Ref Range   Glucose-Capillary 211 (H) 70 - 99 mg/dL   Comment 1 Notify RN    Comment 2  Document in Chart   Glucose, capillary     Status: Abnormal   Collection Time: 08/26/18  5:13 PM  Result Value Ref Range   Glucose-Capillary 308 (H) 70 - 99 mg/dL   Comment 1 Notify RN    Comment 2 Document in Chart   Glucose, capillary     Status: Abnormal   Collection Time: 08/26/18  7:31 PM  Result Value Ref Range   Glucose-Capillary 249 (H) 70 - 99 mg/dL  Glucose, capillary     Status: Abnormal   Collection Time: 08/27/18  5:58 AM  Result Value Ref Range   Glucose-Capillary 139 (H) 70 - 99 mg/dL   Comment 1 Notify RN   C-reactive protein     Status: Abnormal   Collection Time: 08/27/18  6:35 AM  Result Value Ref Range   CRP 1.5 (H) <1.0 mg/dL    Comment: Performed at [facility], [place] [address], [place], [place] [address]  Sedimentation rate     Status: Abnormal   Collection Time: 08/27/18  6:35 AM  Result Value Ref Range   Sed Rate 34 (H) 0 - 22 mm/hr    Comment: Performed at [facility], [name] [address], [place], [place] [address]  Glucose, capillary     Status: Abnormal  Collection Time: 08/27/18 11:52 AM  Result Value Ref Range   Glucose-Capillary 299 (H) 70 - 99 mg/dL    Blood Alcohol level:  Lab Results  Component Value Date   ETH <10 07/28/2018   ETH <10 01/60/1093    Metabolic Disorder Labs: Lab Results  Component Value Date   HGBA1C 8.5 (H) 07/29/2018   MPG 197 07/29/2018   MPG 191.51 05/26/2018   No results found for: PROLACTIN No results found for: CHOL, TRIG, HDL, CHOLHDL, VLDL, LDLCALC  Physical Findings: AIMS: Facial and Oral Movements Muscles of Facial Expression: None, normal Lips and Perioral Area: None, normal Jaw: None, normal Tongue: None, normal,Extremity Movements Upper (arms, wrists, hands, fingers): None, normal Lower (legs, knees, ankles, toes): None, normal, Trunk Movements Neck, shoulders, hips: None, normal, Overall Severity Severity of abnormal movements (highest score from questions  above): None, normal Incapacitation due to abnormal movements: None, normal Patient's awareness of abnormal movements (rate only patient's report): No Awareness, Dental Status Current problems with teeth and/or dentures?: No Does patient usually wear dentures?: No  CIWA:  CIWA-Ar Total: 2 COWS:  COWS Total Score: 0  Musculoskeletal: Strength & Muscle Tone: within normal limits Gait & Station: normal Patient leans: N/A  Psychiatric Specialty Exam: Physical Exam  Nursing note and vitals reviewed. Constitutional: She is oriented to person, place, and time. She appears well-developed and well-nourished.  HENT:  Head: Normocephalic and atraumatic.  Respiratory: Effort normal.  Neurological: She is alert and oriented to person, place, and time.    ROS  Blood pressure 105/72, pulse (!) 124, temperature (!) 97.5 F (36.4 C), resp. rate 18, height 4\' 11"  (1.499 m), weight 41.3 kg, SpO2 100 %, unknown if currently breastfeeding.Body mass index is 18.38 kg/m.  General Appearance: Disheveled  Eye Contact:  Fair  Speech:  Pressured  Volume:  Normal  Mood:  Anxious and Dysphoric  Affect:  Congruent  Thought Process:  Disorganized and Descriptions of Associations: Tangential  Orientation:  Full (Time, Place, and Person)  Thought Content:  Tangential  Suicidal Thoughts:  No  Homicidal Thoughts:  No  Memory:  Immediate;   Fair Recent;   Fair Remote;   Fair  Judgement:  Fair  Insight:  Fair  Psychomotor Activity:  Increased  Concentration:  Concentration: Poor and Attention Span: Poor  Recall:  Poor  Fund of Knowledge:  Fair  Language:  Fair  Akathisia:  Negative  Handed:  Right  AIMS (if indicated):     Assets:  Desire for Improvement Resilience  ADL's:  Impaired  Cognition:  Impaired,  Mild  Sleep:  Number of Hours: 6.75     Treatment Plan Summary: Daily contact with patient to assess and evaluate symptoms and progress in treatment, Medication management and Plan : Patient  is seen and examined.  Patient is a 47 year old female with the above-stated past medical and psychiatric history who is seen in follow-up.   Diagnosis: #1 schizophrenia, #2 diabetes mellitus type 1 poorly controlled, #3 tachycardia, #4 anxiety, #5 chronic anticoagulation, #6 hyperlipidemia  Patient seen in follow-up.  She remains slowly improving outside of continued anxiety, occasional agitation, and tachycardia.  She denied auditory and visual hallucinations today.  Her orientation seems to be a bit improved from this weekend.  I have gone back in the chart several months, and I still cannot find a real rationale for her to be on anticoagulation.  She has been seen by cardiology in the emergency department who diagnosed her tachycardia.  Review of  those records appear that she has been tachycardic for some time.  She had been previously treated with propranolol as well as metoprolol.  For some reason the metoprolol is been stopped.  I am going to restart that today.  After discharge we will get her back to internal medicine doctor did decide about the anticoagulation. 1.  Continue clozapine 50 mg p.o. nightly for psychosis. 2.  Increase gabapentin to 300 mg p.o. 3 times daily for anxiety and mood stability. 3.  Continue hydroxyzine 25 mg p.o. 3 times daily as needed anxiety. 4.  Increase NovoLog insulin to 8 units subcu 3 times daily with food. 5.  Increase Lantus insulin to 38 units subcu daily. 6.  Continue lorazepam 0.5 mg p.o. every 4 hours as needed anxiety. 7.  Continue Trilafon 4 mg p.o. nightly for psychosis. 8.  Continue simvastatin 40 mg p.o. daily for hyperlipidemia. 9.  Continue temazepam 15 mg p.o. nightly as needed insomnia. 10.  Continue trazodone 50 mg p.o. nightly as needed insomnia. 11.  Start metoprolol extended release 25 mg p.o. daily and titrate for tachycardia. 12.  Continue Xarelto at current dosage at least until she sees her internist after discharge. 13.  Disposition  planning-in progress.  Sharma Covert, MD 08/27/2018, 3:21 PM

## 2018-08-27 NOTE — Progress Notes (Signed)
1:1 Progress Note D: Pt currently asleep in bed. Patient appropriate to situation. Pt in no current distress.  A: Sitter is currently at bedside. R: Pt remains safe on a 1:1 per MD orders.

## 2018-08-27 NOTE — Progress Notes (Signed)
[place] NOVEL CORONAVIRUS (COVID-19) DAILY CHECK-OFF SYMPTOMS - answer yes or no to each - every day NO YES  Have you had a fever in the past 24 hours?  . Fever (Temp > 37.80C / 100F) X   Have you had any of these symptoms in the past 24 hours? . New Cough .  Sore Throat  .  Shortness of Breath .  Difficulty Breathing .  Unexplained Body Aches   X   Have you had any one of these symptoms in the past 24 hours not related to allergies?   . Runny Nose .  Nasal Congestion .  Sneezing   X   If you have had runny nose, nasal congestion, sneezing in the past 24 hours, has it worsened?  X   EXPOSURES - check yes or no X   Have you traveled outside the state in the past 14 days?  X   Have you been in contact with someone with a confirmed diagnosis of COVID-19 or PUI in the past 14 days without wearing appropriate PPE?  X   Have you been living in the same home as a person with confirmed diagnosis of COVID-19 or a PUI (household contact)?    X   Have you been diagnosed with COVID-19?    X              What to do next: Answered NO to all: Answered YES to anything:   Proceed with unit schedule Follow the [facility] Inpatient Flowsheet.   

## 2018-08-27 NOTE — Progress Notes (Addendum)
1:1 Progress Note D: Pt currently awake and taking shower. Patient appropriate to situation. Pt in no current distress.  A: Sitter is currently assisting pt. R: Pt remains safe on a 1:1 per MD orders.

## 2018-08-27 NOTE — Progress Notes (Addendum)
1:1 Progress Note D: Pt currently watching tv in the day room. Patient appropriate to situation. Pt in no current distress.  A: Sitter is currently at bedside. R: Pt remains safe on a 1:1 per MD orders.

## 2018-08-27 NOTE — Progress Notes (Signed)
Recreation Therapy Notes  Date: 08/27/2018 Time: 10:00 am Location: 500 hall   Group Topic: Healthy vs Unhealthy Coping Skills  Goal Area(s) Addresses:  Patient will work on Radio producer on Healthy vs Unhealthy Coping Skills. Patient will follow directions on first prompt.  Behavioral Response: Appropriate  Intervention: Worksheet  Activity:  Staff on 500 hall were provided with a worksheet on Healthy vs Unhealthy Coping Skills. Staff was instructed to give it to the patients and have them work on it in place of [place]. Staff was also given 2 coloring sheets and 2 word searches and were given the option to give them out.  Education:  Ability to follow Directions, Change of thought processes Discharge Planning, Goal Planning.   Education Outcome: Acknowledges education/In group clarification offered  Clinical Observations/Feedback: . Due to COVID-19, guidelines group was not held. Group members were provided a learning activity worksheet to work on the topic and above-stated goals. LRT is available to answer any questions patient may have regarding the worksheet.  Tomi Likens, LRT/CTRS         Ailis Rigaud L Delesa Kawa 08/27/2018 10:11 AM

## 2018-08-27 NOTE — Progress Notes (Signed)
1:1 Progress Note D:Pt currently watching tv in the day room. Patient appropriate to situation. Pt in no current distress.  A: Sitter is currentlyat bedside. R:Pt remains safe on a 1:1 per MD orders.

## 2018-08-27 NOTE — Progress Notes (Addendum)
1:1 Progress Note D: Pt currently asleep in bed. Patient appropriate to situation. Pt in no current distress.  A: Sitter is currently at bedside. R: Pt remains safe on a 1:1 per MD orders.

## 2018-08-27 NOTE — Progress Notes (Signed)
Adult Psychoeducational Group Note  Date:  08/27/2018 Time:  11:44 PM  Group Topic/Focus:  Wrap-Up Group:   The focus of this group is to help patients review their daily goal of treatment and discuss progress on daily workbooks.  Participation Level:  Active  Participation Quality:  Appropriate  Affect:  Appropriate  Cognitive:  Appropriate  Insight: Appropriate  Engagement in Group:  Engaged  Modes of Intervention:  Discussion  Additional Comments:  Patient attended group and said her day was a 34. Her goal for today was to attend group. She did not feel like hurting herself or any one else.  [name] 8/40/3754, 11:44 PM

## 2018-08-28 LAB — GLUCOSE, CAPILLARY
Glucose-Capillary: 175 mg/dL — ABNORMAL HIGH (ref 70–99)
Glucose-Capillary: 99 mg/dL (ref 70–99)

## 2018-08-28 MED ORDER — GABAPENTIN 300 MG PO CAPS: 300.0000 mg | ORAL_CAPSULE | Freq: Three times a day (TID) | ORAL | 1 refills | Status: AC

## 2018-08-28 MED ORDER — PERPHENAZINE 4 MG PO TABS: 4.0000 mg | ORAL_TABLET | Freq: Every day | ORAL | 1 refills | Status: DC

## 2018-08-28 MED ORDER — CLOZAPINE 50 MG PO TABS: 50.0000 mg | ORAL_TABLET | Freq: Every day | ORAL | 1 refills | Status: DC

## 2018-08-28 MED ORDER — TRAZODONE HCL 50 MG PO TABS: 50.0000 mg | ORAL_TABLET | Freq: Every evening | ORAL | 1 refills | Status: DC | PRN

## 2018-08-28 NOTE — Discharge Summary (Signed)
Physician Discharge Summary Note  Patient:  Debbie Dorsey is an 48 y.o., female MRN:  [id number] DOB:  [birth year] Patient phone:  [phone] (home)  Patient address:   [place] [address],  Total Time spent with patient: 45 minutes  Date of Admission:  07/31/2018 Date of Discharge: 08/26/2018  Reason for Admission:      Principal Problem: <principal problem not specified> Discharge Diagnoses: Active Problems:   Schizophrenia [facility])   Past Psychiatric History: exstensive  Past Medical History:  Past Medical History:  Diagnosis Date  . Diabetes mellitus without complication ([facility])    History reviewed. No pertinent surgical history. Family History:  Family History  Problem Relation Age of Onset  . Mental illness Sister    Family Psychiatric  History: no new  Social History:  Social History   Substance and Sexual Activity  Alcohol Use No     Social History   Substance and Sexual Activity  Drug Use No    Social History   Socioeconomic History  . Marital status: Single    Spouse name: Not on file  . Number of children: Not on file  . Years of education: Not on file  . Highest education level: Not on file  Occupational History  . Not on file  Social Needs  . Financial resource strain: Not on file  . Food insecurity    Worry: Not on file    Inability: Not on file  . Transportation needs    Medical: Not on file    Non-medical: Not on file  Tobacco Use  . Smoking status: Never Smoker  . Smokeless tobacco: Never Used  Substance and Sexual Activity  . Alcohol use: No  . Drug use: No  . Sexual activity: Not Currently  Lifestyle  . Physical activity    Days per week: Not on file    Minutes per session: Not on file  . Stress: Not on file  Relationships  . Social Herbalist on phone: Not on file    Gets together: Not on file    Attends religious service: Not on file    Active member of club or organization: Not on file     Attends meetings of clubs or organizations: Not on file    Relationship status: Not on file  Other Topics Concern  . Not on file  Social History Narrative  . 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  She is been stable for prolonged period of time on Risperdal however more recently has deteriorated upon admission she was quite disorganized, she also was so atypically disorganized that a CT scan of the head was performed it was negative, labs were generally nondiagnostic though they were minor findings.  We decided to switch her medications and she again failed to respond to continuation and escalation of Risperdal.  We eventually tried clozapine but there seemed to be some sedating effect over 75 mg a day so we chose to give low-dose clozapine to 50 mg day and perphenazine at 4 mg at bedtime.  That was the regimen we settled on.  By the date of discharge she had improved and now she is alert oriented to person place time and situation not exact date but she knew the month and  year and she was cooperative and compliant with meds she was somewhat needy and intrusive but this is somewhat baseline as well.  No EPS or TD no signs or symptoms of toxicity already enrolled in the clozapine rems program. Last CBC on 7/20 showed a white count of 10.5 normal granulocytes neutrophils so forth. Physical Findings: AIMS: Facial and Oral Movements Muscles of Facial Expression: None, normal Lips and Perioral Area: None, normal Jaw: None, normal Tongue: None, normal,Extremity Movements Upper (arms, wrists, hands, fingers): None, normal Lower (legs, knees, ankles, toes): None, normal, Trunk Movements Neck, shoulders, hips: None, normal, Overall Severity Severity of abnormal movements (highest score from questions above): None,  normal Incapacitation due to abnormal movements: None, normal Patient's awareness of abnormal movements (rate only patient's report): No Awareness, Dental Status Current problems with teeth and/or dentures?: No Does patient usually wear dentures?: No  CIWA:  CIWA-Ar Total: 2 COWS:  COWS Total Score: 0  Musculoskeletal: Strength & Muscle Tone: within normal limits Gait & Station: normal Patient leans: N/A  Psychiatric Specialty Exam: ROS  Blood pressure 100/66, pulse (!) 101, temperature 98.1 F (36.7 C), resp. rate 18, height 4\' 11"  (1.499 m), weight 41.3 kg, SpO2 100 %, unknown if currently breastfeeding.Body mass index is 18.38 kg/m.  General Appearance: Casual  Eye Contact::  Good  Speech:  Clear and Coherent409  Volume:  Decreased  Mood:  Euthymic  Affect:  Restricted  Thought Process:  Irrelevant and Descriptions of Associations: Tangential  Orientation:  Full (Time, Place, and Person)  Thought Content:  Tangential  Suicidal Thoughts:  No  Homicidal Thoughts:  No  Memory:  Recent;   Fair  Judgement:  Fair  Insight:  Fair  Psychomotor Activity:  Normal  Concentration:  Fair  Recall:  [employer] of Knowledge:Fair  Language: Fair  Akathisia:  Negative  Handed:  Right  AIMS (if indicated):     Assets:  Communication Skills Desire for Improvement  Sleep:  Number of Hours: 6.75  Cognition: WNL  ADL's:  Intact         Has this patient used any form of tobacco in the last 30 days? (Cigarettes, Smokeless Tobacco, Cigars, and/or Pipes) Yes, No  Blood Alcohol level:  Lab Results  Component Value Date   ETH <10 07/28/2018   ETH <10 16/11/9602    Metabolic Disorder Labs:  Lab Results  Component Value Date   HGBA1C 8.5 (H) 07/29/2018   MPG 197 07/29/2018   MPG 191.51 05/26/2018   No results found for: PROLACTIN No results found for: CHOL, TRIG, HDL, CHOLHDL, VLDL, LDLCALC  See Psychiatric Specialty Exam and Suicide Risk Assessment completed by Attending  Physician prior to discharge.  Discharge destination:  Home  Is patient on multiple antipsychotic therapies at discharge:  No   Has Patient had three or more failed trials of antipsychotic monotherapy by history:  No  Recommended Plan for Multiple Antipsychotic Therapies: NA   Allergies as of 08/28/2018   No Known Allergies     Medication List    STOP taking these medications   benztropine 0.5 MG tablet Commonly known as: COGENTIN   benztropine 1 MG tablet Commonly known as: COGENTIN   busPIRone 5 MG tablet Commonly known as: BUSPAR   risperiDONE 0.5 MG tablet Commonly known as: RisperDAL   temazepam 15 MG capsule Commonly known as: RESTORIL     TAKE these medications     Indication  clozapine 50 MG tablet Commonly known as: CLOZARIL Take 1  tablet (50 mg total) by mouth at bedtime.  Indication: Schizophrenia that does Not Respond to Usual Drug Therapy   gabapentin 300 MG capsule Commonly known as: NEURONTIN Take 1 capsule (300 mg total) by mouth 3 (three) times daily.  Indication: Fibromyalgia Syndrome   insulin NPH-regular Human (70-30) 100 UNIT/ML injection Inject 32 Units into the skin 2 (two) times daily with a meal.  Indication: Insulin-Dependent Diabetes   perphenazine 4 MG tablet Commonly known as: TRILAFON Take 1 tablet (4 mg total) by mouth at bedtime.  Indication: Schizophrenia   Rivaroxaban 15 MG Tabs tablet Commonly known as: XARELTO Take 15 mg by mouth 2 (two) times daily with a meal.  Indication: Blood Clot in a Deep Vein   Rivaroxaban 15 & 20 MG Tbpk Take as directed on package: Start with one 15mg  tablet by mouth twice a day with food. On Day 22, switch to one 20mg  tablet once a day with food.  Indication: Blood Clot in a Deep Vein   simvastatin 40 MG tablet Commonly known as: ZOCOR Take 40 mg by mouth daily at 12 noon.  Indication: High Amount of Triglycerides in the Blood   traZODone 50 MG tablet Commonly known as: DESYREL Take 1  tablet (50 mg total) by mouth at bedtime as needed for sleep. What changed:   medication strength  how much to take  when to take this  reasons to take this  Indication: Drug-Induced Difficulty in [place], [facility] Follow up on 09/12/2018.   Why: Please attend your assessment appointment for ACTT services on Thursday, 8/6 at 12:00p.  Be sure to bring your photo ID, insurance card, SSN, and current medications.  Contact information: [address] [place] [address] [phone]        [facility] Follow up on 08/30/2018.   Why: Telephonic hospital follow up appointment with dr. Margarito Liner is Friday, 7/24 at 9:00a.  Follow up appointment with Josph Macho is Wednesday, 8/12 at 1:15p.  Contact information: [address] [place] [address] [phone]           SignedJohnn Hai, MD 08/28/2018, 11:21 AM

## 2018-08-28 NOTE — Plan of Care (Signed)
Patient attended groups when groups were offered, but did not participate in all.

## 2018-08-28 NOTE — Progress Notes (Signed)
Patient ID: Debbie Dorsey, female   DOB: Jun 13, 1970, 48 y.o.   MRN: [id number] Patient discharged to home/self care in the presence of her grandfather.  Patient denies SI, HI and AVH and is eager to go home.  Patient talked with staff about what she was going to eat when she returned home and was happy to see her grandfather upon discharge. Patient discharge information reviewed with grandfather and he acknowledged understanding.

## 2018-08-28 NOTE — Progress Notes (Signed)
1:1 Progress Note D: Pt currently asleep in the bed. Patient appropriate to situation. Pt in no current distress.  A: Sitter is currently at bedside. R: Pt remains safe on a 1:1 per MD orders.

## 2018-08-28 NOTE — Progress Notes (Signed)
Recreation Therapy Notes  INPATIENT RECREATION TR PLAN  Patient Details Name: Debbie Dorsey MRN: [id number] DOB: 1970/06/07 Today's Date: 08/28/2018  Rec Therapy Plan Is patient appropriate for Therapeutic Recreation?: Yes Treatment times per week: 3-5 times per week Estimated Length of Stay: 5-7 days TR Treatment/Interventions: Group participation (Comment)  Discharge Criteria Pt will be discharged from therapy if:: Discharged Treatment plan/goals/alternatives discussed and agreed upon by:: Patient/family  Discharge Summary Short term goals set: see patient care plan Short term goals met: Adequate for discharge Progress toward goals comments: Groups attended Which groups?: Wellness, Coping skills, Goal setting, Anger management(Triggers, Stress Exploration, Anxiety, Coping Skills) Reason goals not met: n/a Therapeutic equipment acquired: none Reason patient discharged from therapy: Discharge from hospital Pt/family agrees with progress & goals achieved: Yes Date patient discharged from therapy: 08/28/18  Tomi Likens, LRT/CTRS  [place] 08/28/2018, 2:42 PM

## 2018-08-28 NOTE — Progress Notes (Signed)
Patient ID: Debbie Dorsey, female   DOB: 03-04-1970, 48 y.o.   MRN: [id number]   CSW contacted pt's grandfather to discuss discharge for the patient. CSW answered questions regarding the patient's aftercare.

## 2018-08-28 NOTE — Progress Notes (Signed)
  [facility] Adult Case Management Discharge Plan :  Will you be returning to the same living situation after discharge:  Yes,  with grandfather At discharge, do you have transportation home?: Yes,  grandfather Do you have the ability to pay for your medications: Yes,  medicare  Release of information consent forms completed and in the chart;  Patient's signature needed at discharge.  Patient to Follow up at: Follow-up Information    [facility] Follow up on 09/12/2018.   Why: Please attend your assessment appointment for ACTT services on Thursday, 8/6 at 12:00p.  Be sure to bring your photo ID, insurance card, SSN, and current medications.  Contact information: [address] [place]  [address] [phone]        [facility] Follow up on 08/30/2018.   Why: Telephonic hospital follow up appointment with dr. Margarito Liner is Friday, 7/24 at 9:00a.  Follow up appointment with Josph Macho is Wednesday, 8/12 at 1:15p.  Contact information: [address] [place]  [address] [phone]           Next level of care provider has access to [facility] and Suicide Prevention discussed: Yes,  pt's grandfather     Has patient been referred to the Quitline?: N/A patient is not a smoker  Patient has been referred for addiction treatment: Yes  Trecia Rogers, LCSW 08/28/2018, 9:21 AM

## 2018-08-28 NOTE — Tx Team (Signed)
Interdisciplinary Treatment and Diagnostic Plan Update  08/28/2018 Time of Session: 09:18am Debbie Dorsey MRN: [id number]  Principal Diagnosis: <principal problem not specified>  Secondary Diagnoses: Active Problems:   Schizophrenia ([facility])   Current Medications:  Current Facility-Administered Medications  Medication Dose Route Frequency Provider Last Rate Last Dose  . acetaminophen (TYLENOL) tablet 650 mg  650 mg Oral Q6H PRN Sharma Covert, MD      . alum & mag hydroxide-simeth (MAALOX/MYLANTA) 200-200-20 MG/5ML suspension 30 mL  30 mL Oral Q4H PRN Sharma Covert, MD      . cloZAPine (CLOZARIL) tablet 50 mg  50 mg Oral QHS Johnn Hai, MD   50 mg at 08/27/18 2051  . feeding supplement (GLUCERNA SHAKE) (GLUCERNA SHAKE) liquid 237 mL  237 mL Oral TID BM Johnn Hai, MD   237 mL at 08/27/18 1453  . gabapentin (NEURONTIN) capsule 300 mg  300 mg Oral TID Sharma Covert, MD   300 mg at 08/28/18 0816  . hydrOXYzine (ATARAX/VISTARIL) tablet 25 mg  25 mg Oral TID PRN Sharma Covert, MD   25 mg at 08/27/18 2051  . insulin aspart (novoLOG) injection 0-20 Units  0-20 Units Subcutaneous TID WC Patriciaann Clan E, PA-C   4 Units at 08/28/18 [phone]  . insulin aspart (novoLOG) injection 0-5 Units  0-5 Units Subcutaneous QHS Laverle Hobby, PA-C   2 Units at 08/26/18 2019  . insulin aspart (novoLOG) injection 8 Units  8 Units Subcutaneous TID WC Sharma Covert, MD   8 Units at 08/28/18 [phone]  . insulin glargine (LANTUS) injection 38 Units  38 Units Subcutaneous Daily Sharma Covert, MD   38 Units at 08/28/18 0815  . LORazepam (ATIVAN) injection 1 mg  1 mg Intramuscular Q4H PRN Johnn Hai, MD   1 mg at 08/26/18 1234  . LORazepam (ATIVAN) tablet 0.5 mg  0.5 mg Oral Q4H PRN Johnn Hai, MD      . magnesium hydroxide (MILK OF MAGNESIA) suspension 30 mL  30 mL Oral Daily PRN Sharma Covert, MD      . metoprolol succinate (TOPROL-XL) 24 hr tablet 25 mg  25 mg Oral Daily Sharma Covert, MD   25 mg at 08/28/18 0816  . perphenazine (TRILAFON) tablet 4 mg  4 mg Oral QHS Johnn Hai, MD   4 mg at 08/27/18 2051  . prenatal multivitamin tablet 1 tablet  1 tablet Oral Q1200 Johnn Hai, MD   1 tablet at 08/27/18 1203  . rivaroxaban (XARELTO) tablet 20 mg  20 mg Oral Daily Johnn Hai, MD   20 mg at 08/27/18 1711  . simvastatin (ZOCOR) tablet 40 mg  40 mg Oral q1800 Sharma Covert, MD   40 mg at 08/27/18 1710  . temazepam (RESTORIL) capsule 15 mg  15 mg Oral QHS PRN Johnn Hai, MD   15 mg at 08/27/18 2051  . traZODone (DESYREL) tablet 50 mg  50 mg Oral QHS PRN Sharma Covert, MD   50 mg at 08/27/18 2051  . ziprasidone (GEODON) injection 10 mg  10 mg Intramuscular Q6H PRN Sharma Covert, MD   10 mg at 08/25/18 1125   PTA Medications: Medications Prior to Admission  Medication Sig Dispense Refill Last Dose  . benztropine (COGENTIN) 0.5 MG tablet Take 1 tablet (0.5 mg total) by mouth 2 (two) times daily. Restart on 06/07/2018 as per psychiatry (Patient not taking: Reported on 07/29/2018)     . benztropine (COGENTIN) 1  MG tablet Take 0.5 mg by mouth 2 (two) times a day.     . busPIRone (BUSPAR) 5 MG tablet Take 5 mg by mouth 3 (three) times daily.     . insulin NPH-regular Human (70-30) 100 UNIT/ML injection Inject 32 Units into the skin 2 (two) times daily with a meal.     . risperiDONE (RISPERDAL) 0.5 MG tablet Take 1 tablet (0.5 mg total) by mouth 2 (two) times daily. Start on 06/07/2018 as per psychiatry (Patient not taking: Reported on 07/29/2018) 30 tablet 0   . Rivaroxaban (XARELTO) 15 MG TABS tablet Take 15 mg by mouth 2 (two) times daily with a meal.     . Rivaroxaban 15 & 20 MG TBPK Take as directed on package: Start with one 15mg  tablet by mouth twice a day with food. On Day 22, switch to one 20mg  tablet once a day with food. (Patient not taking: Reported on 07/29/2018) 51 each 0   . simvastatin (ZOCOR) 40 MG tablet Take 40 mg by mouth daily at 12 noon.       . temazepam (RESTORIL) 15 MG capsule Take 1 capsule (15 mg total) by mouth at bedtime as needed for sleep. (Patient not taking: Reported on 07/29/2018) 30 capsule 0   . traZODone (DESYREL) 100 MG tablet Take 100 mg by mouth daily.       Patient Stressors: Health problems Medication change or noncompliance Traumatic event  Patient Strengths: Supportive family/friends  Treatment Modalities: Medication Management, Group therapy, Case management,  1 to 1 session with clinician, Psychoeducation, Recreational therapy.   Physician Treatment Plan for Primary Diagnosis: <principal problem not specified> Long Term Goal(s): Improvement in symptoms so as ready for discharge Improvement in symptoms so as ready for discharge   Short Term Goals: Ability to identify changes in lifestyle to reduce recurrence of condition will improve Ability to verbalize feelings will improve Ability to disclose and discuss suicidal ideas Ability to identify and develop effective coping behaviors will improve Ability to maintain clinical measurements within normal limits will improve Compliance with prescribed medications will improve  Medication Management: Evaluate patient's response, side effects, and tolerance of medication regimen.  Therapeutic Interventions: 1 to 1 sessions, Unit Group sessions and Medication administration.  Evaluation of Outcomes: Adequate for Discharge  Physician Treatment Plan for Secondary Diagnosis: Active Problems:   Schizophrenia ([facility])  Long Term Goal(s): Improvement in symptoms so as ready for discharge Improvement in symptoms so as ready for discharge   Short Term Goals: Ability to identify changes in lifestyle to reduce recurrence of condition will improve Ability to verbalize feelings will improve Ability to disclose and discuss suicidal ideas Ability to identify and develop effective coping behaviors will improve Ability to maintain clinical measurements within normal  limits will improve Compliance with prescribed medications will improve     Medication Management: Evaluate patient's response, side effects, and tolerance of medication regimen.  Therapeutic Interventions: 1 to 1 sessions, Unit Group sessions and Medication administration.  Evaluation of Outcomes: Adequate for Discharge   RN Treatment Plan for Primary Diagnosis: <principal problem not specified> Long Term Goal(s): Knowledge of disease and therapeutic regimen to maintain health will improve  Short Term Goals: Ability to participate in decision making will improve, Ability to verbalize feelings will improve, Ability to disclose and discuss suicidal ideas, Ability to identify and develop effective coping behaviors will improve and Compliance with prescribed medications will improve  Medication Management: RN will administer medications as ordered by provider, will assess and  evaluate patient's response and provide education to patient for prescribed medication. RN will report any adverse and/or side effects to prescribing provider.  Therapeutic Interventions: 1 on 1 counseling sessions, Psychoeducation, Medication administration, Evaluate responses to treatment, Monitor vital signs and CBGs as ordered, Perform/monitor CIWA, COWS, AIMS and Fall Risk screenings as ordered, Perform wound care treatments as ordered.  Evaluation of Outcomes: Adequate for Discharge   LCSW Treatment Plan for Primary Diagnosis: <principal problem not specified> Long Term Goal(s): Safe transition to appropriate next level of care at discharge, Engage patient in therapeutic group addressing interpersonal concerns.  Short Term Goals: Engage patient in aftercare planning with referrals and resources and Increase skills for wellness and recovery  Therapeutic Interventions: Assess for all discharge needs, 1 to 1 time with Social worker, Explore available resources and support systems, Assess for adequacy in community  support network, Educate family and significant other(s) on suicide prevention, Complete Psychosocial Assessment, Interpersonal group therapy.  Evaluation of Outcomes: Adequate for Discharge   Progress in Treatment: Attending groups: Yes. Participating in groups: Yes. Taking medication as prescribed: Yes. Toleration medication: Yes. Family/Significant other contact made: Yes, individual(s) contacted:  pt's grandfather Patient understands diagnosis: No. Discussing patient identified problems/goals with staff: Yes. Medical problems stabilized or resolved: Yes. Denies suicidal/homicidal ideation: Yes. Issues/concerns per patient self-inventory: No. Other:   New problem(s) identified: No, Describe:  None  New Short Term/Long Term Goal(s): Medication stabilization, elimination of SI thoughts, and development of a comprehensive mental wellness plan.   Patient Goals:    Discharge Plan or Barriers: Pt will be discharging today. Pt will be following up with [facility] ACTT team and [facility] until ACTT team starts.   Reason for Continuation of Hospitalization: Patient is discharging today.   Estimated Length of Stay: Patient is discharging today.   Attendees: Patient: 08/28/2018   Physician: Dr. Johnn Hai, MD 08/28/2018   Nursing: Namon Cirri 08/28/2018   RN Care Manager: 08/28/2018   Social Worker: Ardelle Anton, [place] 08/28/2018   Recreational Therapist:  08/28/2018   Other:  08/28/2018   Other:  08/28/2018   Other: 08/28/2018       Scribe for Treatment Team: Trecia Rogers, LCSW 08/28/2018 10:13 AM

## 2018-08-28 NOTE — [facility] Suicide Risk Assessment (Signed)
[facility] Discharge Suicide Risk Assessment   Principal Problem:  Exacerbation of underlying schizophrenic condition Discharge Diagnoses: Active Problems:   Schizophrenia ([facility])   Total Time spent with patient: 45 minutes  Musculoskeletal: Strength & Muscle Tone: within normal limits Gait & Station: normal Patient leans: N/A  Psychiatric Specialty Exam: ROS  Blood pressure 100/66, pulse (!) 101, temperature 98.1 F (36.7 C), resp. rate 18, height 4\' 11"  (1.499 m), weight 41.3 kg, SpO2 100 %, unknown if currently breastfeeding.Body mass index is 18.38 kg/m.  General Appearance: Casual  Eye Contact::  Good  Speech:  Clear and Coherent409  Volume:  Decreased  Mood:  Euthymic  Affect:  Restricted  Thought Process:  Irrelevant and Descriptions of Associations: Tangential  Orientation:  Full (Time, Place, and Person)  Thought Content:  Tangential  Suicidal Thoughts:  No  Homicidal Thoughts:  No  Memory:  Recent;   Fair  Judgement:  Fair  Insight:  Fair  Psychomotor Activity:  Normal  Concentration:  Fair  Recall:  [employer] of Knowledge:Fair  Language: Fair  Akathisia:  Negative  Handed:  Right  AIMS (if indicated):     Assets:  Communication Skills Desire for Improvement  Sleep:  Number of Hours: 6.75  Cognition: WNL  ADL's:  Intact   Mental Status Per Nursing Assessment::   On Admission:  Self-harm behaviors  Demographic Factors:  Low socioeconomic status and Unemployed  Loss Factors: Decrease in vocational status  Historical Factors: NA  Risk Reduction Factors:   Sense of responsibility to family and Religious beliefs about death  Continued Clinical Symptoms:  Schizophrenia:   Paranoid or undifferentiated type  Cognitive Features That Contribute To Risk:  Loss of executive function    Suicide Risk:  Minimal: No identifiable suicidal ideation.  Patients presenting with no risk factors but with morbid ruminations; may be classified as minimal risk based on  the severity of the depressive symptoms  Follow-up [place], [facility] Follow up on 09/12/2018.   Why: Please attend your assessment appointment for ACTT services on Thursday, 8/6 at 12:00p.  Be sure to bring your photo ID, insurance card, SSN, and current medications.  Contact information: [address] [place] [address] [phone]        [facility] Follow up on 08/30/2018.   Why: Telephonic hospital follow up appointment with dr. Margarito Liner is Friday, 7/24 at 9:00a.  Follow up appointment with Josph Macho is Wednesday, 8/12 at 1:15p.  Contact information: [address] [place] [address] [phone]           Plan Of Care/Follow-up recommendations:  Activity:  full  Tywanna Seifer, MD 08/28/2018, 11:19 AM

## 2018-09-04 ENCOUNTER — Ambulatory Visit (HOSPITAL_COMMUNITY): Payer: Medicare Other | Admitting: Psychiatry

## 2018-09-24 ENCOUNTER — Ambulatory Visit: Payer: Medicare Other | Admitting: Neurology

## 2018-09-25 ENCOUNTER — Encounter: Payer: Self-pay | Admitting: Neurology

## 2018-11-01 ENCOUNTER — Ambulatory Visit
Admission: RE | Admit: 2018-11-01 | Discharge: 2018-11-01 | Disposition: A | Discharge status: Home / Self Care | Payer: Medicare Other | Source: Ambulatory Visit | Attending: Internal Medicine | Admitting: Internal Medicine

## 2018-11-01 ENCOUNTER — Other Ambulatory Visit: Payer: Self-pay

## 2018-11-01 ENCOUNTER — Other Ambulatory Visit: Payer: Self-pay | Admitting: Internal Medicine

## 2018-11-01 ENCOUNTER — Other Ambulatory Visit: Payer: Self-pay | Admitting: Registered Nurse

## 2018-11-01 DIAGNOSIS — R4182 Altered mental status, unspecified: Secondary | ICD-10-CM

## 2018-11-28 ENCOUNTER — Emergency Department (HOSPITAL_COMMUNITY)
Admission: EM | Admit: 2018-11-28 | Discharge: 2018-11-29 | Disposition: A | Discharge status: Other Acute Inpatient Hospital | Payer: Medicare Other | Source: Home / Self Care | Attending: Emergency Medicine | Admitting: Emergency Medicine

## 2018-11-28 ENCOUNTER — Encounter (HOSPITAL_COMMUNITY): Payer: Self-pay | Admitting: Emergency Medicine

## 2018-11-28 ENCOUNTER — Other Ambulatory Visit: Payer: Self-pay

## 2018-11-28 DIAGNOSIS — Z79899 Other long term (current) drug therapy: Secondary | ICD-10-CM | POA: Insufficient documentation

## 2018-11-28 DIAGNOSIS — E119 Type 2 diabetes mellitus without complications: Secondary | ICD-10-CM | POA: Insufficient documentation

## 2018-11-28 DIAGNOSIS — F209 Schizophrenia, unspecified: Secondary | ICD-10-CM

## 2018-11-28 DIAGNOSIS — Z794 Long term (current) use of insulin: Secondary | ICD-10-CM | POA: Insufficient documentation

## 2018-11-28 DIAGNOSIS — Z20828 Contact with and (suspected) exposure to other viral communicable diseases: Secondary | ICD-10-CM | POA: Insufficient documentation

## 2018-11-28 DIAGNOSIS — F29 Unspecified psychosis not due to a substance or known physiological condition: Secondary | ICD-10-CM | POA: Insufficient documentation

## 2018-11-28 HISTORY — DX: Schizophrenia, unspecified: F20.9

## 2018-11-28 LAB — CBG MONITORING, ED: Glucose-Capillary: 188 mg/dL — ABNORMAL HIGH (ref 70–99)
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
## 2018-11-28 NOTE — Progress Notes (Signed)
CSW updated [employer] who is aware of pt and stated once labs are back a decision can be made regarding the appropriateness of a SANE exam.   EPD states that a SANE exam will likely not be possible if pt is demonstrating psychosis, and that while the pt has not been in the ED very long, at some point a TTS consult will likely be placed after further review.  2nd shift ED CSW will leave handoff for 1st shift ED CSW.  CSW will continue to follow for D/C needs.  [name]. Ahaan Zobrist, LCSW, LCAS, CSI Transitions of Care Clinical Social Worker Care Coordination Department Ph: [phone]

## 2018-11-29 ENCOUNTER — Inpatient Hospital Stay (HOSPITAL_COMMUNITY)
Admission: AD | Admit: 2018-11-29 | Discharge: 2018-12-06 | DRG: 885 | Disposition: A | Discharge status: Home / Self Care | Payer: Medicare Other | Attending: Psychiatry | Admitting: Psychiatry

## 2018-11-29 ENCOUNTER — Encounter (HOSPITAL_COMMUNITY): Payer: Self-pay | Admitting: Emergency Medicine

## 2018-11-29 DIAGNOSIS — M797 Fibromyalgia: Secondary | ICD-10-CM | POA: Diagnosis present

## 2018-11-29 DIAGNOSIS — G4700 Insomnia, unspecified: Secondary | ICD-10-CM | POA: Diagnosis present

## 2018-11-29 DIAGNOSIS — F209 Schizophrenia, unspecified: Secondary | ICD-10-CM | POA: Diagnosis present

## 2018-11-29 DIAGNOSIS — Z20828 Contact with and (suspected) exposure to other viral communicable diseases: Secondary | ICD-10-CM | POA: Diagnosis present

## 2018-11-29 DIAGNOSIS — E119 Type 2 diabetes mellitus without complications: Secondary | ICD-10-CM | POA: Diagnosis present

## 2018-11-29 DIAGNOSIS — F200 Paranoid schizophrenia: Secondary | ICD-10-CM | POA: Diagnosis not present

## 2018-11-29 DIAGNOSIS — R000 Tachycardia, unspecified: Secondary | ICD-10-CM | POA: Diagnosis present

## 2018-11-29 DIAGNOSIS — J45909 Unspecified asthma, uncomplicated: Secondary | ICD-10-CM | POA: Diagnosis present

## 2018-11-29 DIAGNOSIS — E785 Hyperlipidemia, unspecified: Secondary | ICD-10-CM | POA: Diagnosis present

## 2018-11-29 DIAGNOSIS — F329 Major depressive disorder, single episode, unspecified: Secondary | ICD-10-CM | POA: Diagnosis present

## 2018-11-29 DIAGNOSIS — Z794 Long term (current) use of insulin: Secondary | ICD-10-CM

## 2018-11-29 LAB — CBC WITH DIFFERENTIAL/PLATELET
Abs Immature Granulocytes: 0.02 10*3/uL (ref 0.00–0.07)
Basophils Absolute: 0 10*3/uL (ref 0.0–0.1)
Basophils Relative: 1 %
Eosinophils Absolute: 0.1 10*3/uL (ref 0.0–0.5)
Eosinophils Relative: 2 %
HCT: 39 % (ref 36.0–46.0)
Hemoglobin: 12.6 g/dL (ref 12.0–15.0)
Immature Granulocytes: 0 %
Lymphocytes Relative: 32 %
Lymphs Abs: 1.8 10*3/uL (ref 0.7–4.0)
MCH: 31.8 pg (ref 26.0–34.0)
MCHC: 32.3 g/dL (ref 30.0–36.0)
MCV: 98.5 fL (ref 80.0–100.0)
Monocytes Absolute: 0.4 10*3/uL (ref 0.1–1.0)
Monocytes Relative: 8 %
Neutro Abs: 3.2 10*3/uL (ref 1.7–7.7)
Neutrophils Relative %: 57 %
Platelets: 321 10*3/uL (ref 150–400)
RBC: 3.96 MIL/uL (ref 3.87–5.11)
RDW: 12.9 % (ref 11.5–15.5)
WBC: 5.6 10*3/uL (ref 4.0–10.5)
nRBC: 0 % (ref 0.0–0.2)

## 2018-11-29 LAB — CBC
HCT: 37.2 % (ref 36.0–46.0)
Hemoglobin: 12.2 g/dL (ref 12.0–15.0)
MCH: 32.4 pg (ref 26.0–34.0)
MCHC: 32.8 g/dL (ref 30.0–36.0)
MCV: 98.7 fL (ref 80.0–100.0)
Platelets: 316 10*3/uL (ref 150–400)
RBC: 3.77 MIL/uL — ABNORMAL LOW (ref 3.87–5.11)
RDW: 12.9 % (ref 11.5–15.5)
WBC: 7.9 10*3/uL (ref 4.0–10.5)
nRBC: 0 % (ref 0.0–0.2)

## 2018-11-29 LAB — COMPREHENSIVE METABOLIC PANEL
ALT: 18 U/L (ref 0–44)
AST: 18 U/L (ref 15–41)
Albumin: 3.9 g/dL (ref 3.5–5.0)
Alkaline Phosphatase: 62 U/L (ref 38–126)
Anion gap: 9 (ref 5–15)
BUN: 7 mg/dL (ref 6–20)
CO2: 23 mmol/L (ref 22–32)
Calcium: 9.1 mg/dL (ref 8.9–10.3)
Chloride: 105 mmol/L (ref 98–111)
Creatinine, Ser: 0.73 mg/dL (ref 0.44–1.00)
GFR calc Af Amer: >60 mL/min (ref >60)
GFR calc non Af Amer: >60 mL/min (ref >60)
Glucose, Bld: 182 mg/dL — ABNORMAL HIGH (ref 70–99)
Potassium: 3.7 mmol/L (ref 3.5–5.1)
Sodium: 137 mmol/L (ref 135–145)
Total Bilirubin: 0.4 mg/dL (ref 0.3–1.2)
Total Protein: 7.2 g/dL (ref 6.5–8.1)

## 2018-11-29 LAB — ACETAMINOPHEN LEVEL: Acetaminophen (Tylenol), Serum: <10 ug/mL — ABNORMAL LOW (ref 10–30)

## 2018-11-29 LAB — I-STAT BETA HCG BLOOD, ED (MC, WL, AP ONLY): I-stat hCG, quantitative: <5 m[IU]/mL (ref <5)

## 2018-11-29 LAB — ETHANOL: Alcohol, Ethyl (B): <10 mg/dL (ref <10)

## 2018-11-29 LAB — SARS CORONAVIRUS 2 BY RT PCR (HOSPITAL ORDER, PERFORMED IN ~~LOC~~ HOSPITAL LAB): SARS Coronavirus 2: NEGATIVE

## 2018-11-29 LAB — CBG MONITORING, ED
Glucose-Capillary: 318 mg/dL — ABNORMAL HIGH (ref 70–99)
Glucose-Capillary: 322 mg/dL — ABNORMAL HIGH (ref 70–99)
Glucose-Capillary: 227 mg/dL — ABNORMAL HIGH (ref 70–99)
Glucose-Capillary: 170 mg/dL — ABNORMAL HIGH (ref 70–99)

## 2018-11-29 LAB — SALICYLATE LEVEL: Salicylate Lvl: <7 mg/dL (ref 2.8–30.0)

## 2018-11-29 LAB — GLUCOSE, CAPILLARY: Glucose-Capillary: 73 mg/dL (ref 70–99)

## 2018-11-29 MED ORDER — STERILE WATER FOR INJECTION IJ SOLN
INTRAMUSCULAR | Status: AC
  Administered 2018-11-29: 10 mL
  Filled 2018-11-29: qty 10

## 2018-11-29 MED ORDER — ZIPRASIDONE MESYLATE 20 MG IM SOLR
10.0000 mg | Freq: Once | INTRAMUSCULAR | Status: AC
  Administered 2018-11-29: 10 mg via INTRAMUSCULAR
  Filled 2018-11-29: qty 20

## 2018-11-29 MED ORDER — LORAZEPAM 2 MG/ML IJ SOLN
1.0000 mg | Freq: Once | INTRAMUSCULAR | Status: AC
  Administered 2018-11-29: 1 mg via INTRAMUSCULAR
  Filled 2018-11-29: qty 1

## 2018-11-29 MED ORDER — PANTOPRAZOLE SODIUM 40 MG PO TBEC
40.0000 mg | DELAYED_RELEASE_TABLET | Freq: Every day | ORAL | Status: DC
  Administered 2018-11-29: 40 mg via ORAL
  Filled 2018-11-29: qty 1

## 2018-11-29 MED ORDER — CLOZAPINE 25 MG PO TABS
50.0000 mg | ORAL_TABLET | Freq: Every day | ORAL | Status: DC
  Filled 2018-11-29: qty 2

## 2018-11-29 MED ORDER — ALBUTEROL SULFATE HFA 108 (90 BASE) MCG/ACT IN AERS: 2.0000 | INHALATION_SPRAY | RESPIRATORY_TRACT | Status: DC | PRN

## 2018-11-29 MED ORDER — GABAPENTIN 300 MG PO CAPS
300.0000 mg | ORAL_CAPSULE | Freq: Three times a day (TID) | ORAL | Status: DC
  Administered 2018-11-29: 300 mg via ORAL
  Filled 2018-11-29: qty 1

## 2018-11-29 MED ORDER — SIMVASTATIN 40 MG PO TABS: 40.0000 mg | ORAL_TABLET | Freq: Every day | ORAL | Status: DC

## 2018-11-29 MED ORDER — INSULIN ASPART PROT & ASPART (70-30 MIX) 100 UNIT/ML ~~LOC~~ SUSP
32.0000 [IU] | Freq: Two times a day (BID) | SUBCUTANEOUS | Status: DC
  Administered 2018-11-29: 32 [IU] via SUBCUTANEOUS
  Filled 2018-11-29: qty 10

## 2018-11-29 MED ORDER — TRAZODONE HCL 50 MG PO TABS
50.0000 mg | ORAL_TABLET | Freq: Every evening | ORAL | Status: DC | PRN
  Administered 2018-11-29: 50 mg via ORAL
  Filled 2018-11-29: qty 1

## 2018-11-29 MED ORDER — ACETAMINOPHEN 325 MG PO TABS: 650.0000 mg | ORAL_TABLET | Freq: Four times a day (QID) | ORAL | Status: DC | PRN

## 2018-11-29 NOTE — ED Notes (Signed)
Messaged Dr. Hillard Danker about CBG = 318 as well.

## 2018-11-29 NOTE — ED Notes (Signed)
Dr Randal Buba consulted SANE and they said that they can't do an exam if patient can't consent.

## 2018-11-29 NOTE — ED Notes (Signed)
Report given to Demaris Callander at [facility]. Bed is ready. Tonette Bihari currently asking pt to sign Voluntary Consent.

## 2018-11-29 NOTE — ED Notes (Signed)
Safe [facility] called, they will be here around 10:30 om to pick up patient and transport to [facility].
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
## 2018-11-29 NOTE — ED Notes (Signed)
Pt allowed IM injection of Geodon 10 mg in right ventrogluteal without complaint.

## 2018-11-29 NOTE — ED Notes (Signed)
Pt yelling at staff from room requesting to call "somebody." Pt was advised that we have to wait until she spoke with the counselor. Pt asked repeatedly. Pt then requested to call her grandfather. Pt was advised that she has to wait and that there are scrubs at the bedside for her to change into. Pt refused and asked where she was. Pt continues to call from the room asking to call "somebody."

## 2018-11-29 NOTE — BH Assessment (Signed)
[place] Assessment Progress Note This writer attempted to contact patient's grandfather Teryl Lucy [phone] (guardian) unsuccessfully with whom patient resides to gather collateral information. Per chart review it is also unclear who patient is receiving OP services from. Per history patient had been receiving medication management from [facility]. This Probation officer left a message with nursing staff there for a return call to verify. Per notes patient was referred to [facility] MD also on 09/04/18 after being seen on 07/18/18 at [facility] per that assessment. This Probation officer contacted that provider who stated patient did not follow up with that appointment. Status pending.

## 2018-11-29 NOTE — ED Notes (Signed)
attempted v/ital signs pt refused

## 2018-11-29 NOTE — ED Notes (Signed)
Called pharmacy to please clear the 70/30 insulin.

## 2018-11-29 NOTE — ED Notes (Signed)
Attempted to dress pt into maroon scrubs with assistance from [place], [place]. Pt refused and continued to state "I'm alright. I'm alright" pulling the blankets back over her head.

## 2018-11-29 NOTE — ED Notes (Signed)
Messaged Dr. Langston Masker that pt CBG = 318.

## 2018-11-29 NOTE — ED Notes (Signed)
Refuses vitals

## 2018-11-29 NOTE — ED Notes (Signed)
Pt alert and eating dinner. Signed consent for [place] to talk with her grandfather, Teryl Lucy.

## 2018-11-29 NOTE — ED Notes (Signed)
This Probation officer, [place], RN, and Wells Guiles, RN went into room at pt's request. Pt allowed staff to assist her in dressing into her maroon scrubs. Pt's belongings were bagged and placed in 16-18 cabinet.

## 2018-11-29 NOTE — BH Assessment (Addendum)
[place] Assessment Progress Note  Per Hampton Abbot, MD, this pt requires psychiatric hospitalization at this time.  Heather, RN has assigned pt to [facility] Rm 500-1.  Pt has signed Voluntary Admission and Consent for Treatment and signed form has been faxed to [facility].  While discussing consent for admission with pt, this writer asked if it is true that her grandfather, Teryl Lucy [phone]), is her legal guardian as indicated in pt's TTS assessment.  Please note that pt's EPIC record does not contain a letter of guardianship.  Pt answers in the affirmative.  I further clarified by asking if the court had awarded guardianship to him, and again, she answers in the affirmative.  I will attempt to reach the grandfather, but please keep in mind that a copy of the letter of guardianship needs to be obtained.  Pt's nurse, Diane, has been notified of pt's disposition, and agrees to send original paperwork along with pt via [facility], and to call report to [phone].  Jalene Mullet, [place] Behavioral Health Coordinator [phone]   Addendum:  At 16:03 I reached the grandfather at this cell phone number, [phone].  After some discussion I ascertained that he is not legal guardian, but that he is very concerned about the pt's well being.  Pt subsequently signed Consent to Release Information to Mr Teryl Lucy, and I have notified him of pt's disposition.  He reports that in the past, pt received outpatient services from [facility], but that she is now a client of [facility], where he took her for services as recently as yesterday, 11/28/2018.  He has been provided with contact information for [facility].  Jalene Mullet, [place] Coordinator [phone]
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
## 2018-11-29 NOTE — ED Provider Notes (Addendum)
Vitals:   11/28/18 2315 11/29/18 0611  BP: 115/82 113/84  Pulse: (!) 104 (!) 102  Resp: 16 18  Temp: 99.4 F (37.4 C)   SpO2: 98% 99%   Pt medically cleared.  Awaiting psychiatric assessment. 8:33 AM Notified that pt is agitated and requires medications.  Geodon and ativan ordered     Dorie Rank, MD 11/29/18 [phone]
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
## 2018-11-29 NOTE — ED Notes (Signed)
Pt now has a Actuary. This Probation officer served as Civil engineer, contracting with pt in front of the nurse's station and all staff kept an eye on her with the curtains open.

## 2018-11-29 NOTE — ED Notes (Signed)
Pt. Refused CBG, RN, Wille Glaser made aware.
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
## 2018-11-29 NOTE — ED Notes (Signed)
CBG 170. Called [facility] to give report, attempted to give report to Ronalee Belts, he requested that I called Vaughan Basta. She is busy passing meds and will not be available until around 10 pm.

## 2018-11-29 NOTE — ED Notes (Signed)
Pt's grandfather is not her legal guardian.

## 2018-11-29 NOTE — BHH Counselor (Signed)
TTS attempted to do assessment with patient. Patient refused to talk with TTS and was non-responsive and lying down under cover, refused eye contact and engagement. Nurse tech Asencion Partridge tried to rouse patient. Pt to be seen when she is able to engage. TTS confirmed with [facility] Wynonia Hazard, pt's refusal to participate.
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
## 2018-11-29 NOTE — ED Notes (Signed)
Pt has wet her bed possibly r/t Ativan given IM. Staff report that she was actually wet prior to administration of the Ativan.

## 2018-11-29 NOTE — ED Notes (Signed)
Call from [place] at [facility] that bed is not ready until after 7:30 pm tonight.

## 2018-11-29 NOTE — ED Notes (Signed)
Patient escorted out to Safe transport. Patient belongings and consent forms transferred with patient.

## 2018-11-29 NOTE — ED Notes (Addendum)
This RN attempted to perform psychiatric assessment and assess patient needs. Patient refused to cooperate. Patient continually asking this RN to let her "call someone." Patient updated that she is waiting for the provider to speak with her before she can make any phone calls. Patient continued to be confused and asking the same questions over and over. Patient does not respond to questions such as this RN asking if she needs to use the restroom, has any pain, or is hungry. Patient just stares at the writer, then asks the same questions again. Patient refuses vital signs. Patient refuses to provide urine at this time. Awaiting TTS consult.

## 2018-11-29 NOTE — Progress Notes (Signed)
Inpatient Diabetes Program Recommendations  AACE/ADA: New Consensus Statement on Inpatient Glycemic Control (2015)  Target Ranges:  Prepandial:   less than 140 mg/dL      Peak postprandial:   less than 180 mg/dL (1-2 hours)      Critically ill patients:  140 - 180 mg/dL   Lab Results  Component Value Date   GLUCAP 318 (H) 11/29/2018   HGBA1C 8.5 (H) 07/29/2018    Review of Glycemic Control  Diabetes history: DM1 Outpatient Diabetes medications: 70/30 32 units bid Current orders for Inpatient glycemic control: 70/30 32 units bid  HgbA1C - 8.5%  Inpatient Diabetes Program Recommendations:     Add Novolog 0-9 units tidwc and 0-5 units QHS Add CHO mod diet to replace Regular diet  Will follow.  Thank you. Lorenda Peck, RD, LDN, CDE Inpatient Diabetes Coordinator [phone]

## 2018-11-29 NOTE — ED Notes (Signed)
Messaged Dr. Vanita Panda as well about CBG.

## 2018-11-29 NOTE — ED Notes (Signed)
Report given to [facility], [place]. Requests that patient be sent over at 10 pm.

## 2018-11-29 NOTE — ED Notes (Signed)
Administration of Ge[place] changed by Dekina RA to 11:00. Pt was at the time of administration anxious and agitated because her grandfather has not come to get her.

## 2018-11-29 NOTE — ED Notes (Signed)
Earl pt's grandfather

## 2018-11-30 ENCOUNTER — Encounter (HOSPITAL_COMMUNITY): Payer: Self-pay

## 2018-11-30 ENCOUNTER — Other Ambulatory Visit: Payer: Self-pay

## 2018-11-30 DIAGNOSIS — F200 Paranoid schizophrenia: Secondary | ICD-10-CM

## 2018-11-30 LAB — GLUCOSE, CAPILLARY
Glucose-Capillary: 74 mg/dL (ref 70–99)
Glucose-Capillary: 175 mg/dL — ABNORMAL HIGH (ref 70–99)
Glucose-Capillary: 188 mg/dL — ABNORMAL HIGH (ref 70–99)
Glucose-Capillary: 195 mg/dL — ABNORMAL HIGH (ref 70–99)

## 2018-11-30 MED ORDER — TRAZODONE HCL 50 MG PO TABS
50.0000 mg | ORAL_TABLET | Freq: Every evening | ORAL | Status: DC | PRN
  Administered 2018-11-30 – 2018-12-02 (×3): 50 mg via ORAL
  Filled 2018-11-30 (×4): qty 1

## 2018-11-30 MED ORDER — INSULIN ASPART 100 UNIT/ML ~~LOC~~ SOLN
0.0000 [IU] | Freq: Three times a day (TID) | SUBCUTANEOUS | Status: DC
  Administered 2018-11-30 (×2): 2 [IU] via SUBCUTANEOUS
  Administered 2018-12-01: 9 [IU] via SUBCUTANEOUS
  Administered 2018-12-01: 13:00:00 3 [IU] via SUBCUTANEOUS
  Administered 2018-12-02 – 2018-12-03 (×3): 9 [IU] via SUBCUTANEOUS
  Administered 2018-12-03: 1 [IU] via SUBCUTANEOUS
  Administered 2018-12-03: 9 [IU] via SUBCUTANEOUS
  Administered 2018-12-04: 13:00:00 5 [IU] via SUBCUTANEOUS
  Administered 2018-12-04 – 2018-12-05 (×2): 9 [IU] via SUBCUTANEOUS
  Administered 2018-12-05: 13:00:00 7 [IU] via SUBCUTANEOUS
  Administered 2018-12-05: 2 [IU] via SUBCUTANEOUS

## 2018-11-30 MED ORDER — ALBUTEROL SULFATE HFA 108 (90 BASE) MCG/ACT IN AERS: 2.0000 | INHALATION_SPRAY | RESPIRATORY_TRACT | Status: DC | PRN

## 2018-11-30 MED ORDER — MAGNESIUM HYDROXIDE 400 MG/5ML PO SUSP: 30.0000 mL | Freq: Every day | ORAL | Status: DC | PRN

## 2018-11-30 MED ORDER — PERPHENAZINE 2 MG PO TABS
2.0000 mg | ORAL_TABLET | Freq: Two times a day (BID) | ORAL | Status: DC
  Administered 2018-11-30 – 2018-12-02 (×5): 2 mg via ORAL
  Filled 2018-11-30 (×8): qty 1

## 2018-11-30 MED ORDER — CLOZAPINE 25 MG PO TABS
50.0000 mg | ORAL_TABLET | ORAL | Status: AC
  Administered 2018-11-30: 12:00:00 50 mg via ORAL
  Filled 2018-11-30: qty 2

## 2018-11-30 MED ORDER — CLOZAPINE 25 MG PO TABS
50.0000 mg | ORAL_TABLET | Freq: Every day | ORAL | Status: DC
  Administered 2018-11-30 – 2018-12-03 (×4): 50 mg via ORAL
  Filled 2018-11-30 (×4): qty 2
  Filled 2018-11-30: qty 1
  Filled 2018-11-30 (×2): qty 2

## 2018-11-30 MED ORDER — ACETAMINOPHEN 325 MG PO TABS: 650.0000 mg | ORAL_TABLET | Freq: Four times a day (QID) | ORAL | Status: DC | PRN

## 2018-11-30 MED ORDER — SIMVASTATIN 40 MG PO TABS
40.0000 mg | ORAL_TABLET | Freq: Every day | ORAL | Status: DC
  Administered 2018-11-30 – 2018-12-05 (×6): 40 mg via ORAL
  Filled 2018-11-30: qty 1
  Filled 2018-11-30: qty 2
  Filled 2018-11-30 (×7): qty 1

## 2018-11-30 MED ORDER — INSULIN NPH (HUMAN) (ISOPHANE) 100 UNIT/ML ~~LOC~~ SUSP: 20.0000 [IU] | Freq: Two times a day (BID) | SUBCUTANEOUS | Status: DC

## 2018-11-30 MED ORDER — INSULIN NPH (HUMAN) (ISOPHANE) 100 UNIT/ML ~~LOC~~ SUSP
10.0000 [IU] | Freq: Two times a day (BID) | SUBCUTANEOUS | Status: DC
  Administered 2018-11-30 – 2018-12-01 (×2): 10 [IU] via SUBCUTANEOUS

## 2018-11-30 MED ORDER — GABAPENTIN 300 MG PO CAPS
300.0000 mg | ORAL_CAPSULE | Freq: Three times a day (TID) | ORAL | Status: DC
  Administered 2018-11-30 – 2018-12-02 (×7): 300 mg via ORAL
  Filled 2018-11-30 (×13): qty 1

## 2018-11-30 MED ORDER — ALUM & MAG HYDROXIDE-SIMETH 200-200-20 MG/5ML PO SUSP: 30.0000 mL | ORAL | Status: DC | PRN

## 2018-11-30 NOTE — BHH Group Notes (Signed)
[place] Group Notes: (Clinical Social Work)   11/30/2018      Type of Therapy:  Group Therapy   Participation Level:  Did Not Attend - was invited both individually by MHT and by overhead announcement, chose not to attend.   Selmer Dominion, LCSW 11/30/2018, 1:09 PM

## 2018-11-30 NOTE — Tx Team (Signed)
Initial Treatment Plan 11/30/2018 2:20 AM Debbie Dorsey [id number]    PATIENT STRESSORS: Marital or family conflict Medication change or noncompliance   PATIENT STRENGTHS: General fund of knowledge Motivation for treatment/growth   PATIENT IDENTIFIED PROBLEMS:  psychosis  "nothing"                   DISCHARGE CRITERIA:  Improved stabilization in mood, thinking, and/or behavior Verbal commitment to aftercare and medication compliance  PRELIMINARY DISCHARGE PLAN: Attend aftercare/continuing care group Outpatient therapy Participate in family therapy  PATIENT/FAMILY INVOLVEMENT: This treatment plan has been presented to and reviewed with the patient, Debbie Dorsey.  The patient and family have been given the opportunity to ask questions and make suggestions.  Providence Crosby, RN 11/30/2018, 2:20 AM
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
## 2018-11-30 NOTE — Progress Notes (Signed)
Pt enrolled in clozapine registry Requires weekly CBC with dif   Einar Grad, [place] D

## 2018-11-30 NOTE — H&P (Signed)
Psychiatric Admission Assessment Adult  Patient Identification: Debbie Dorsey MRN:  [id number] Date of Evaluation:  11/30/2018 Chief Complaint:  Schizophrenia Principal Diagnosis: <principal problem not specified> Diagnosis:  Active Problems:   Schizophrenia ([place])  History of Present Illness:Patient is seen and examined.  Patient is a 48 year old female with a past psychiatric history significant for schizophrenia who presented to the [facility] emergency department on 11/29/2018.  The patient is intermittently essentially mute.  According to the admission note she had been staying with her grandfather, the [facility] police have been contacted by her grandfather who reported that the patient had been acting bizarre.  Per the notes from the [facility] police one of the neighbors stated that the patient and her grandfather were having sex and were in a relationship and that they were not trying to hide it.  While EMS reported there, she threw a cereal box at her grandfather, then police dispatch there to transport the patient to the ED.  Patient is familiar to me from her early admission this year.  She was transferred from the [facility] secondary to poorly controlled diabetes at that time to treat her schizophrenia.  She was admitted on 07/31/2018.  She remained in the hospital for essentially a month.  Her discharge medications at that time included Clozaril, gabapentin, insulin NPH, perphenazine and simvastatin.  The patient is essentially mute today, and the only thing that she repeats is "where is my grandfather".  The patient has had multiple psychiatric hospitalizations for her schizophrenia.  Her last hospitalization before the June hospitalization was in March 2020.  She was admitted to the hospital for evaluation and stabilization.   Associated Signs/Symptoms: Depression Symptoms:  depressed mood, anhedonia, insomnia, anxiety, (Hypo) Manic Symptoms:   Delusions, Distractibility, Flight of Ideas, Hallucinations, Impulsivity, Labiality of Mood, Anxiety Symptoms:  Excessive Worry, Psychotic Symptoms:  Delusions, Hallucinations: Auditory Ideas of Reference, Paranoia, PTSD Symptoms: Negative Total Time spent with patient: 30 minutes  Past Psychiatric History: Patient has had multiple psychiatric hospitalizations with the last being in June of this year.  She was hospitalized for approximately 1 month at that time secondary to multiple medication failures.  Is the patient at risk to self? Yes.    Has the patient been a risk to self in the past 6 months? Yes.    Has the patient been a risk to self within the distant past? Yes.    Is the patient a risk to others? No.  Has the patient been a risk to others in the past 6 months? No.  Has the patient been a risk to others within the distant past? No.   Prior Inpatient Therapy:   Prior Outpatient Therapy:    Alcohol Screening: 1. How often do you have a drink containing alcohol?: Never 2. How many drinks containing alcohol do you have on a typical day when you are drinking?: 1 or 2 3. How often do you have six or more drinks on one occasion?: Never AUDIT-C Score: 0 4. How often during the last year have you found that you were not able to stop drinking once you had started?: Never 5. How often during the last year have you failed to do what was normally expected from you becasue of drinking?: Never 6. How often during the last year have you needed a first drink in the morning to get yourself going after a heavy drinking session?: Never 7. How often during the last year have you had  a feeling of guilt of remorse after drinking?: Never 8. How often during the last year have you been unable to remember what happened the night before because you had been drinking?: Never 9. Have you or someone else been injured as a result of your drinking?: No 10. Has a relative or friend or a doctor or  another health worker been concerned about your drinking or suggested you cut down?: No Alcohol Use Disorder Identification Test Final Score (AUDIT): 0 Substance Abuse History in the last 12 months:  No. Consequences of Substance Abuse: Negative Previous Psychotropic Medications: Yes  Psychological Evaluations: Yes  Past Medical History:  Past Medical History:  Diagnosis Date  . Diabetes mellitus without complication ([place])   . Schizophrenia ([place])    History reviewed. No pertinent surgical history. Family History:  Family History  Problem Relation Age of Onset  . Mental illness Sister    Family Psychiatric  History: Reportedly noncontributory Tobacco Screening:   Social History:  Social History   Substance and Sexual Activity  Alcohol Use No     Social History   Substance and Sexual Activity  Drug Use No    Additional Social History:                           Allergies:  No Known Allergies Lab Results:  Results for orders placed or performed during the hospital encounter of 11/29/18 (from the past 48 hour(s))  Glucose, capillary     Status: None   Collection Time: 11/29/18 11:53 PM  Result Value Ref Range   Glucose-Capillary 73 70 - 99 mg/dL  Glucose, capillary     Status: None   Collection Time: 11/30/18  6:22 AM  Result Value Ref Range   Glucose-Capillary 74 70 - 99 mg/dL  Glucose, capillary     Status: Abnormal   Collection Time: 11/30/18 11:12 AM  Result Value Ref Range   Glucose-Capillary 175 (H) 70 - 99 mg/dL   Comment 1 Notify RN     Blood Alcohol level:  Lab Results  Component Value Date   ETH <10 11/28/2018   ETH <10 [id number]    Metabolic Disorder Labs:  Lab Results  Component Value Date   HGBA1C 8.5 (H) 07/29/2018   MPG 197 07/29/2018   MPG 191.51 05/26/2018   No results found for: PROLACTIN No results found for: CHOL, TRIG, HDL, CHOLHDL, VLDL, LDLCALC  Current Medications: Current Facility-Administered Medications   Medication Dose Route Frequency Provider Last Rate Last Dose  . acetaminophen (TYLENOL) tablet 650 mg  650 mg Oral Q6H PRN Emmaline Kluver, FNP      . albuterol (VENTOLIN HFA) 108 (90 Base) MCG/ACT inhaler 2 puff  2 puff Inhalation Q4H PRN Emmaline Kluver, FNP      . alum & mag hydroxide-simeth (MAALOX/MYLANTA) 200-200-20 MG/5ML suspension 30 mL  30 mL Oral Q4H PRN Emmaline Kluver, FNP      . cloZAPine (CLOZARIL) tablet 50 mg  50 mg Oral QHS Sharma Covert, MD      . gabapentin (NEURONTIN) capsule 300 mg  300 mg Oral TID Sharma Covert, MD   300 mg at 11/30/18 1102  . insulin aspart (novoLOG) injection 0-9 Units  0-9 Units Subcutaneous TID WC Sharma Covert, MD   2 Units at 11/30/18 1153  . insulin NPH Human (NOVOLIN N) injection 10 Units  10 Units Subcutaneous BID AC & HS Mallie Darting, Cordie Grice, MD      .  magnesium hydroxide (MILK OF MAGNESIA) suspension 30 mL  30 mL Oral Daily PRN Emmaline Kluver, FNP      . perphenazine (TRILAFON) tablet 2 mg  2 mg Oral BID Sharma Covert, MD   2 mg at 11/30/18 1058  . simvastatin (ZOCOR) tablet 40 mg  40 mg Oral q1800 Sharma Covert, MD      . traZODone (DESYREL) tablet 50 mg  50 mg Oral QHS PRN Emmaline Kluver, FNP       PTA Medications: Medications Prior to Admission  Medication Sig Dispense Refill Last Dose  . acetaminophen (TYLENOL) 325 MG tablet Take 650 mg by mouth every 6 (six) hours as needed for mild pain or headache.     . albuterol (VENTOLIN HFA) 108 (90 Base) MCG/ACT inhaler Inhale 2 puffs into the lungs every 4 (four) hours as needed for shortness of breath or wheezing.     . cloZAPine (CLOZARIL) 25 MG tablet Take 50 mg by mouth at bedtime.     . cloZAPine (CLOZARIL) 50 MG tablet Take 1 tablet (50 mg total) by mouth at bedtime. (Patient not taking: Reported on 11/29/2018) 90 tablet 1   . gabapentin (NEURONTIN) 300 MG capsule Take 1 capsule (300 mg total) by mouth 3 (three) times daily. 90 capsule 1   . insulin NPH-regular Human (70-30) 100  UNIT/ML injection Inject 32 Units into the skin 2 (two) times daily with a meal.     . Multiple Vitamin (MULTIVITAMIN WITH MINERALS) TABS tablet Take 1 tablet by mouth daily.     . pantoprazole (PROTONIX) 40 MG tablet Take 40 mg by mouth daily.     Marland Kitchen perphenazine (TRILAFON) 4 MG tablet Take 1 tablet (4 mg total) by mouth at bedtime. (Patient not taking: Reported on 11/29/2018) 90 tablet 1   . Rivaroxaban 15 & 20 MG TBPK Take as directed on package: Start with one 15mg  tablet by mouth twice a day with food. On Day 22, switch to one 20mg  tablet once a day with food. (Patient not taking: Reported on 07/29/2018) 51 each 0   . simvastatin (ZOCOR) 40 MG tablet Take 40 mg by mouth daily at 12 noon.      . traZODone (DESYREL) 50 MG tablet Take 1 tablet (50 mg total) by mouth at bedtime as needed for sleep. 90 tablet 1     Musculoskeletal: Strength & Muscle Tone: within normal limits Gait & Station: normal Patient leans: N/A  Psychiatric Specialty Exam: Physical Exam  Nursing note and vitals reviewed. Constitutional: She appears well-developed.  HENT:  Head: Normocephalic and atraumatic.  Respiratory: Effort normal.  Neurological: She is alert.    ROS  Blood pressure 97/72, pulse (!) 139, temperature 97.7 F (36.5 C), temperature source Oral, resp. rate 18, height 4' 10.5" (1.486 m), weight 43.5 kg, SpO2 99 %, unknown if currently breastfeeding.Body mass index is 19.72 kg/m.  General Appearance: Disheveled  Eye Contact:  Minimal  Speech:  Blocked and Normal Rate  Volume:  Decreased  Mood:  Anxious, Depressed and Dysphoric  Affect:  Flat  Thought Process:  Disorganized and Descriptions of Associations: Circumstantial  Orientation:  Negative  Thought Content:  Delusions, Hallucinations: Auditory, Paranoid Ideation and Rumination  Suicidal Thoughts:  No  Homicidal Thoughts:  No  Memory:  Immediate;   Poor Recent;   Poor Remote;   Poor  Judgement:  Impaired  Insight:  Lacking   Psychomotor Activity:  Increased  Concentration:  Concentration: Poor and Attention Span:  Poor  Recall:  Poor  Fund of Knowledge:  Poor  Language:  Poor  Akathisia:  Negative  Handed:  Right  AIMS (if indicated):     Assets:  Desire for Improvement Resilience  ADL's:  Impaired  Cognition:  WNL  Sleep:  Number of Hours: 4.5    Treatment Plan Summary: Daily contact with patient to assess and evaluate symptoms and progress in treatment, Medication management and Plan : Patient is seen and examined.  Patient is a 48 year old female with the above-stated past psychiatric history who was brought to the behavioral health hospital secondary to worsening psychiatric symptoms and most probably noncompliance with medications.  She will be admitted to the hospital.  She will be integrated into the milieu.  She will be encouraged to attend groups.  Given the complexity of her illness, her multiple hospitalizations, her multiple medication failures we will restart her clozapine.  She was taking 50 mg p.o. nightly.  We will start that today.  I will also give her 25 mg p.o. now to assist in her treatment.  She was also discharged on perphenazine 4 mg p.o. nightly.  We will restart that at 2 mg p.o. twice daily, and titrate that during the course hospitalization.  On her discharge she also was on insulin NPH 32 units subcu twice daily.  I suspect that her p.o. intake has been low and her blood sugars been stable here.  I will restart her NPH 20 units subcu twice daily and hold that if she is hypoglycemic.  We will also put on board blood sugar checks 3 times daily before meals and at bedtime.  Her gabapentin will be started 300 mg p.o. 3 times daily.  She will also have available her simvastatin as well as trazodone nightly as needed.  I will also write for lorazepam 1 mg p.o. every 6 hours as needed agitation or anxiety.  Her vital signs are stable, and her blood sugar this morning was 74.  On admission on 10/22  her blood sugar was 182.  She has otherwise normal electrolytes.  Her CBC was normal as well as her differential.  Pregnancy test was negative.  Alcohol was negative.  Beta-hCG was negative.  Observation Level/Precautions:  15 minute checks  Laboratory:  Chemistry Profile  Psychotherapy:    Medications:    Consultations:    Discharge Concerns:    Estimated LOS:  Other:     Physician Treatment Plan for Primary Diagnosis: <principal problem not specified> Long Term Goal(s): Improvement in symptoms so as ready for discharge  Short Term Goals: Ability to identify changes in lifestyle to reduce recurrence of condition will improve, Ability to verbalize feelings will improve, Ability to disclose and discuss suicidal ideas, Ability to demonstrate self-control will improve, Ability to identify and develop effective coping behaviors will improve, Ability to maintain clinical measurements within normal limits will improve and Compliance with prescribed medications will improve  Physician Treatment Plan for Secondary Diagnosis: Active Problems:   Schizophrenia ([place])  Long Term Goal(s): Improvement in symptoms so as ready for discharge  Short Term Goals: Ability to identify changes in lifestyle to reduce recurrence of condition will improve, Ability to verbalize feelings will improve, Ability to disclose and discuss suicidal ideas, Ability to demonstrate self-control will improve, Ability to identify and develop effective coping behaviors will improve, Ability to maintain clinical measurements within normal limits will improve and Compliance with prescribed medications will improve  I certify that inpatient services furnished can reasonably be  expected to improve the patient's condition.    Sharma Covert, MD 10/24/20201:06 PM

## 2018-11-30 NOTE — Progress Notes (Signed)
DAR NOTE: Patient presents with anxious affect and paranoid behavior.  Observed pacing the hallway and attempting to leave the unit.  Mumbling to her self and continuously asking to see her grandfather.  Appears disorganized and confused but redirectable.  Denies pain, auditory and visual hallucinations.  Rates depression at 0, hopelessness at 0, and anxiety at 0.  Maintained on routine safety checks.  Medications given as prescribed.  Support and encouragement offered as needed.  States goal for today is "to be alright."  Patient is safe on the unit.

## 2018-11-30 NOTE — Progress Notes (Signed)
Pt was paranoid with going in her room this evening, pt asked female staff to sleep with her in the room this evening.

## 2018-12-01 DIAGNOSIS — F200 Paranoid schizophrenia: Secondary | ICD-10-CM | POA: Diagnosis not present

## 2018-12-01 LAB — GLUCOSE, CAPILLARY
Glucose-Capillary: 89 mg/dL (ref 70–99)
Glucose-Capillary: 206 mg/dL — ABNORMAL HIGH (ref 70–99)
Glucose-Capillary: 395 mg/dL — ABNORMAL HIGH (ref 70–99)
Glucose-Capillary: 401 mg/dL — ABNORMAL HIGH (ref 70–99)
Glucose-Capillary: 334 mg/dL — ABNORMAL HIGH (ref 70–99)

## 2018-12-01 MED ORDER — LORAZEPAM 1 MG PO TABS
1.0000 mg | ORAL_TABLET | ORAL | Status: AC | PRN
  Administered 2018-12-01: 1 mg via ORAL
  Filled 2018-12-01: qty 1

## 2018-12-01 MED ORDER — INSULIN NPH (HUMAN) (ISOPHANE) 100 UNIT/ML ~~LOC~~ SUSP
10.0000 [IU] | Freq: Two times a day (BID) | SUBCUTANEOUS | Status: DC
  Administered 2018-12-01: 10 [IU] via SUBCUTANEOUS

## 2018-12-01 MED ORDER — ZIPRASIDONE MESYLATE 20 MG IM SOLR
20.0000 mg | INTRAMUSCULAR | Status: AC | PRN
  Administered 2018-12-04 – 2018-12-05 (×2): 20 mg via INTRAMUSCULAR
  Filled 2018-12-01 (×3): qty 20

## 2018-12-01 MED ORDER — CLOZAPINE 25 MG PO TABS
25.0000 mg | ORAL_TABLET | ORAL | Status: AC
  Administered 2018-12-01: 25 mg via ORAL

## 2018-12-01 MED ORDER — OLANZAPINE 10 MG PO TBDP: 10.0000 mg | ORAL_TABLET | Freq: Three times a day (TID) | ORAL | Status: DC | PRN

## 2018-12-01 MED ORDER — MEGESTROL ACETATE 40 MG PO TABS
40.0000 mg | ORAL_TABLET | Freq: Every day | ORAL | Status: DC
  Administered 2018-12-01 – 2018-12-06 (×5): 40 mg via ORAL
  Filled 2018-12-01 (×8): qty 1

## 2018-12-01 NOTE — Progress Notes (Signed)
[facility] MD Progress Note  12/01/2018 9:56 AM NADYA STOLDT  MRN:  [id number] Subjective: Patient is a 48 year old female with a past psychiatric history significant for schizophrenia who presented to the [facility] emergency department on 11/29/2018 with worsening psychotic symptoms.  Objective: Patient is seen and examined.  Patient is a 48 year old female with the above-stated past psychiatric history who is seen in follow-up.  Throughout the day yesterday she remained standing near to the door.  She was trying to elope.  She had to be redirected multiple times.  She also had a bowel movement on herself and had to be assisted to get cleaned up.  She remains tachycardic with a rate of approximately 139.  Her blood pressure is stable at 97/72.  She only slept 4.5 hours last night.  No new laboratories.  Her blood sugar this morning was 89.  This a.m. the patient's is lying in the bed.  She has not eaten well this morning.  She answers questions but still appears to be responding to internal stimuli.  Principal Problem: <principal problem not specified> Diagnosis: Active Problems:   Schizophrenia ([place])  Total Time spent with patient: 20 minutes  Past Psychiatric History: See admission H&P  Past Medical History:  Past Medical History:  Diagnosis Date  . Diabetes mellitus without complication ([place])   . Schizophrenia ([place])    History reviewed. No pertinent surgical history. Family History:  Family History  Problem Relation Age of Onset  . Mental illness Sister    Family Psychiatric  History: See admission H&P Social History:  Social History   Substance and Sexual Activity  Alcohol Use No     Social History   Substance and Sexual Activity  Drug Use No    Social History   Socioeconomic History  . Marital status: Single    Spouse name: Not on file  . Number of children: Not on file  . Years of education: Not on file  . Highest education level: Not on file   Occupational History  . Not on file  Social Needs  . Financial resource strain: Not on file  . Food insecurity    Worry: Not on file    Inability: Not on file  . Transportation needs    Medical: Not on file    Non-medical: Not on file  Tobacco Use  . Smoking status: Never Smoker  . Smokeless tobacco: Never Used  Substance and Sexual Activity  . Alcohol use: No  . Drug use: No  . Sexual activity: Not Currently  Lifestyle  . Physical activity    Days per week: Not on file    Minutes per session: Not on file  . Stress: Not on file  Relationships  . Social Herbalist on phone: Not on file    Gets together: Not on file    Attends religious service: Not on file    Active member of club or organization: Not on file    Attends meetings of clubs or organizations: Not on file    Relationship status: Not on file  Other Topics Concern  . Not on file  Social History Narrative  . Not on file   Additional Social History:                         Sleep: Fair  Appetite:  Poor  Current Medications: Current Facility-Administered Medications  Medication Dose Route Frequency Provider Last Rate Last  Dose  . acetaminophen (TYLENOL) tablet 650 mg  650 mg Oral Q6H PRN Emmaline Kluver, FNP      . albuterol (VENTOLIN HFA) 108 (90 Base) MCG/ACT inhaler 2 puff  2 puff Inhalation Q4H PRN Emmaline Kluver, FNP      . alum & mag hydroxide-simeth (MAALOX/MYLANTA) 200-200-20 MG/5ML suspension 30 mL  30 mL Oral Q4H PRN Emmaline Kluver, FNP      . cloZAPine (CLOZARIL) tablet 50 mg  50 mg Oral QHS Sharma Covert, MD   50 mg at 11/30/18 1959  . gabapentin (NEURONTIN) capsule 300 mg  300 mg Oral TID Sharma Covert, MD   300 mg at 12/01/18 [id number]  . insulin aspart (novoLOG) injection 0-9 Units  0-9 Units Subcutaneous TID WC Sharma Covert, MD   2 Units at 11/30/18 1817  . insulin NPH Human (NOVOLIN N) injection 10 Units  10 Units Subcutaneous BID AC & HS Sharma Covert, MD   10  Units at 12/01/18 [phone]  . magnesium hydroxide (MILK OF MAGNESIA) suspension 30 mL  30 mL Oral Daily PRN Emmaline Kluver, FNP      . perphenazine (TRILAFON) tablet 2 mg  2 mg Oral BID Sharma Covert, MD   2 mg at 12/01/18 0915  . simvastatin (ZOCOR) tablet 40 mg  40 mg Oral q1800 Sharma Covert, MD   40 mg at 11/30/18 1959  . traZODone (DESYREL) tablet 50 mg  50 mg Oral QHS PRN Emmaline Kluver, FNP   50 mg at 11/30/18 2000    Lab Results:  Results for orders placed or performed during the hospital encounter of 11/29/18 (from the past 48 hour(s))  Glucose, capillary     Status: None   Collection Time: 11/29/18 11:53 PM  Result Value Ref Range   Glucose-Capillary 73 70 - 99 mg/dL  Glucose, capillary     Status: None   Collection Time: 11/30/18  6:22 AM  Result Value Ref Range   Glucose-Capillary 74 70 - 99 mg/dL  Glucose, capillary     Status: Abnormal   Collection Time: 11/30/18 11:12 AM  Result Value Ref Range   Glucose-Capillary 175 (H) 70 - 99 mg/dL   Comment 1 Notify RN   Glucose, capillary     Status: Abnormal   Collection Time: 11/30/18  6:15 PM  Result Value Ref Range   Glucose-Capillary 188 (H) 70 - 99 mg/dL  Glucose, capillary     Status: Abnormal   Collection Time: 11/30/18  7:35 PM  Result Value Ref Range   Glucose-Capillary 195 (H) 70 - 99 mg/dL  Glucose, capillary     Status: None   Collection Time: 12/01/18  6:06 AM  Result Value Ref Range   Glucose-Capillary 89 70 - 99 mg/dL    Blood Alcohol level:  Lab Results  Component Value Date   ETH <10 11/28/2018   ETH <10 [id number]    Metabolic Disorder Labs: Lab Results  Component Value Date   HGBA1C 8.5 (H) 07/29/2018   MPG 197 07/29/2018   MPG 191.51 05/26/2018   No results found for: PROLACTIN No results found for: CHOL, TRIG, HDL, CHOLHDL, VLDL, LDLCALC  Physical Findings: AIMS:  , ,  ,  ,    CIWA:    COWS:     Musculoskeletal: Strength & Muscle Tone: decreased Gait & Station: shuffle Patient  leans: N/A  Psychiatric Specialty Exam: Physical Exam  Nursing note and vitals reviewed. Constitutional: She is  oriented to person, place, and time. She appears well-developed and well-nourished.  HENT:  Head: Normocephalic and atraumatic.  Respiratory: Effort normal.  Neurological: She is alert and oriented to person, place, and time.    ROS  Blood pressure 97/72, pulse (!) 139, temperature 97.7 F (36.5 C), temperature source Oral, resp. rate 18, height 4' 10.5" (1.486 m), weight 43.5 kg, SpO2 99 %, unknown if currently breastfeeding.Body mass index is 19.72 kg/m.  General Appearance: Disheveled  Eye Contact:  Fair  Speech:  Blocked and Normal Rate  Volume:  Decreased  Mood:  Dysphoric  Affect:  Flat  Thought Process:  Disorganized and Descriptions of Associations: Loose  Orientation:  Negative  Thought Content:  Delusions, Hallucinations: Auditory and Paranoid Ideation  Suicidal Thoughts:  No  Homicidal Thoughts:  No  Memory:  Immediate;   Poor Recent;   Poor Remote;   Poor  Judgement:  Impaired  Insight:  Lacking  Psychomotor Activity:  Decreased  Concentration:  Concentration: Fair and Attention Span: Fair  Recall:  [employer] of Knowledge:  Poor  Language:  Fair  Akathisia:  Negative  Handed:  Right  AIMS (if indicated):     Assets:  Desire for Improvement Resilience  ADL's:  Impaired  Cognition:  Impaired,  Mild  Sleep:  Number of Hours: 6     Treatment Plan Summary: Daily contact with patient to assess and evaluate symptoms and progress in treatment, Medication management and Plan : Patient is seen and examined.  Patient is a 48 year old female with the above-stated past psychiatric history who is seen in follow-up.   Diagnosis: #1 schizophrenia, #2 diabetes mellitus type 2, #3 asthma, #4 tachycardia, #5 hyperlipidemia  Patient is seen in follow-up.  She is less agitated this morning, but is not eating well, and staying withdrawn and isolated.  She still  appears to be responding to internal stimuli as having thought blocking.  Her blood sugar was 89 this morning, but she is not taking a great deal of p.o. intake.  I will continue her clozapine to 50 mg p.o. nightly, and if she remains in her bed all day today I may decrease her perphenazine down to 2 mg at at bedtime.  Given her lack of p.o. intake I am also going to stop her NPH and discontinue the sliding scale insulin for now.  I will go on and write for a metabolic panel in the a.m. tomorrow to monitor her renal function and the make sure she does not become so dehydrated that requires medical hospitalization as she had previously done.  Her pulse this morning is 139, but the one done shortly prior that showed a rate of 92.  We do not have an EKG on this hospitalization, so I will order that this morning just to make sure about her rate.  Otherwise no other changes today. 1.  Continue Clozaril 50 mg p.o. nightly for schizophrenia. 2.  Continue Ventolin HFA 2 puffs inhalation every 4 hours as needed shortness of breath or wheezing. 3.  Continue gabapentin 300 mg p.o. 3 times daily for mood stability. 4.  Hold NPH insulin for now but continue sliding scale insulin until p.o. intake increases. 5.  Continue perphenazine 2 mg p.o. twice daily, but if the patient appears lethargic during the day or oversedated we may hold her daytime dosage. 6.  Continue trazodone 50 mg p.o. nightly as needed insomnia. 7.  Check basic metabolic panel in a.m. tomorrow. 8.  Disposition planning-in progress.  Sharma Covert, MD 12/01/2018, 9:56 AM

## 2018-12-01 NOTE — [facility] Counselor (Signed)
CSW attempted to assess patient on 10/24 and on 10/25. Both times patient's psychotic state impeded the patient's ability to participate.

## 2018-12-01 NOTE — Progress Notes (Signed)
Adult Psychoeducational Group Note  Date:  12/01/2018 Time:  9:44 PM  Group Topic/Focus:  Wrap-Up Group:   The focus of this group is to help patients review their daily goal of treatment and discuss progress on daily workbooks.  Participation Level:  Minimal  Participation Quality:  Drowsy  Affect:  Appropriate and Flat  Cognitive:  Disorganized, Confused and Lacking  Insight: Lacking and Limited  Engagement in Group:  Lacking, Limited and Poor  Modes of Intervention:  Discussion  Additional Comments: Pt stated her goal for today was to focus on her treatment plan. Pt stated she accomplished her goal today. Pt stated her relationship with her family and support system has improved since she was admitted here. Pt stated she felt better about herself today. Pt rated her over all day a 7 out of 10. Pt stated her appetite was poor today. Pt stated her goal for tonight was to get some sleep. Pt stated she was in no pain this evening. Pt stated she was not hearing or seeing anything that was not there. Pt stated she had no thoughts of harming herself or others. Pt stated if anything change she would alert staff.  Candy Sledge 12/01/2018, 9:44 PM

## 2018-12-01 NOTE — Plan of Care (Signed)
Nurses and MHTs have encouraged patient throughout the day to eat, drink fluids.  Ginger ale and water has been given patient during the day.

## 2018-12-01 NOTE — [facility] Group Notes (Signed)
[place] LCSW Group Therapy Note  Date/Time:  12/01/2018  11:00AM-12:00PM  Type of Therapy and Topic:  Group Therapy:  Music and Mood  Participation Level:  Did Not Attend   Description of Group: In this process group, members listened to a variety of genres of music and identified that different types of music evoke different responses.  Patients were encouraged to identify music that was soothing for them and music that was energizing for them.  Patients discussed how this knowledge can help with wellness and recovery in various ways including managing depression and anxiety as well as encouraging healthy sleep habits.    Therapeutic Goals: 1. Patients will explore the impact of different varieties of music on mood 2. Patients will verbalize the thoughts they have when listening to different types of music 3. Patients will identify music that is soothing to them as well as music that is energizing to them 4. Patients will discuss how to use this knowledge to assist in maintaining wellness and recovery 5. Patients will explore the use of music as a coping skill  Summary of Patient Progress:   Did not attend  Therapeutic Modalities: Solution Focused Brief Therapy Activity   Selmer Dominion, LCSW

## 2018-12-01 NOTE — Progress Notes (Signed)
Patient has had to be re-directed numerous times for standing at the door knocking on it. She constantly asked about her grandfather and Probation officer reminded her that he had visited on tonight. Patient had a bowel movement on herself and had to be assisted to get cleaned up. Patient safety maintained with 15 min checks.

## 2018-12-01 NOTE — Progress Notes (Signed)
D:  Patient stated she does hear voices and sees things.  Patient denied SI and HI, contracts for safety.  Patient would not discuss the voices or what she sees.  Patient has continually throughout the day asked about her grandfather, where he is now.  Has tried to call her grandfather several times throughout the day.  Patient has asked intermittently throughout the day where she is and wants to go home.  Patient has stood at the [employer], knocking on the door, wanting to go home. A:  Patient ate 30% of her lunch today.  Has eaten snacks yogurt and cookies.  Patient has drank several cups of ginger ale and water.  Emotional support and encouragement given patient. R:  Safety maintained with 15 minute checks.

## 2018-12-02 DIAGNOSIS — F200 Paranoid schizophrenia: Secondary | ICD-10-CM | POA: Diagnosis not present

## 2018-12-02 LAB — GLUCOSE, CAPILLARY
Glucose-Capillary: 62 mg/dL — ABNORMAL LOW (ref 70–99)
Glucose-Capillary: 227 mg/dL — ABNORMAL HIGH (ref 70–99)
Glucose-Capillary: 398 mg/dL — ABNORMAL HIGH (ref 70–99)
Glucose-Capillary: 384 mg/dL — ABNORMAL HIGH (ref 70–99)
Glucose-Capillary: 229 mg/dL — ABNORMAL HIGH (ref 70–99)

## 2018-12-02 MED ORDER — LORAZEPAM 0.5 MG PO TABS
0.5000 mg | ORAL_TABLET | Freq: Two times a day (BID) | ORAL | Status: DC
  Administered 2018-12-02 – 2018-12-03 (×3): 0.5 mg via ORAL
  Filled 2018-12-02 (×3): qty 1

## 2018-12-02 MED ORDER — INSULIN NPH (HUMAN) (ISOPHANE) 100 UNIT/ML ~~LOC~~ SUSP
20.0000 [IU] | Freq: Two times a day (BID) | SUBCUTANEOUS | Status: DC
  Administered 2018-12-02 – 2018-12-03 (×3): 20 [IU] via SUBCUTANEOUS

## 2018-12-02 MED ORDER — PERPHENAZINE 2 MG PO TABS
6.0000 mg | ORAL_TABLET | Freq: Every day | ORAL | Status: DC
  Administered 2018-12-03 – 2018-12-05 (×3): 6 mg via ORAL
  Filled 2018-12-02 (×5): qty 3

## 2018-12-02 NOTE — Progress Notes (Signed)
CSW received call from [place] stating that the APS that was submitted by ED CSW Marylou Flesher was screened in and assigned to [facility] [phone]). DSS worker reports that Ms. Tim Lair is aware that patient has been admitted into [facility]. Please follow up with Ms. Tim Lair when patient is to discharge for DSS to follow up.   [place], LCSW Transitions of Care Department [facility] ED [phone]

## 2018-12-02 NOTE — [facility] Group Notes (Signed)
  [place] LCSW Group Therapy Note  Date/Time: 12/02/2018 @ 1:30pm  Type of Therapy/Topic:  Group Therapy:  Emotion Regulation  Participation Level:  Did Not Attend   Mood: Did not attend   Description of Group:    The purpose of this group is to assist patients in learning to regulate negative emotions and experience positive emotions. Patients will be guided to discuss ways in which they have been vulnerable to their negative emotions. These vulnerabilities will be juxtaposed with experiences of positive emotions or situations, and patients challenged to use positive emotions to combat negative ones. Special emphasis will be placed on coping with negative emotions in conflict situations, and patients will process healthy conflict resolution skills.  Therapeutic Goals: 1. Patient will identify two positive emotions or experiences to reflect on in order to balance out negative emotions:  2. Patient will label two or more emotions that they find the most difficult to experience:  3. Patient will be able to demonstrate positive conflict resolution skills through discussion or role plays:   Summary of Patient Progress:   Patient did not attend group today. Patient was asleep.     Therapeutic Modalities:   Cognitive Behavioral Therapy Feelings Identification Dialectical Behavioral Therapy   Ardelle Anton, LCSW

## 2018-12-02 NOTE — Progress Notes (Signed)
D:  Patient has been asking about her grandfather today.  Walking to double doors, knocking on doors, asking to go home.  This afternoon patient has been sleeping. A:  Ms. Debbie Dorsey from [place] visited patient this afternoon while patient was resting.  Stated she would return later and talk to patient. R:  Safety maintained with 15 minute checks.  Emotional support and encouragement givne patient throughout the day.

## 2018-12-02 NOTE — Plan of Care (Signed)
Nurse discussed anxiety and coping skills with patient. 

## 2018-12-02 NOTE — Tx Team (Signed)
Interdisciplinary Treatment and Diagnostic Plan Update  12/02/2018 Time of Session: 10:30am Debbie Dorsey MRN: [id number]  Principal Diagnosis: <principal problem not specified>  Secondary Diagnoses: Active Problems:   Schizophrenia ([place])   Current Medications:  Current Facility-Administered Medications  Medication Dose Route Frequency Provider Last Rate Last Dose  . acetaminophen (TYLENOL) tablet 650 mg  650 mg Oral Q6H PRN Emmaline Kluver, FNP      . albuterol (VENTOLIN HFA) 108 (90 Base) MCG/ACT inhaler 2 puff  2 puff Inhalation Q4H PRN Emmaline Kluver, FNP      . alum & mag hydroxide-simeth (MAALOX/MYLANTA) 200-200-20 MG/5ML suspension 30 mL  30 mL Oral Q4H PRN Emmaline Kluver, FNP      . cloZAPine (CLOZARIL) tablet 50 mg  50 mg Oral QHS Sharma Covert, MD   50 mg at 12/01/18 2059  . gabapentin (NEURONTIN) capsule 300 mg  300 mg Oral TID Sharma Covert, MD   300 mg at 12/02/18 0900  . insulin aspart (novoLOG) injection 0-9 Units  0-9 Units Subcutaneous TID WC Sharma Covert, MD   9 Units at 12/02/18 1257  . insulin NPH Human (NOVOLIN N) injection 20 Units  20 Units Subcutaneous BID AC & HS Mallie Darting, Cordie Grice, MD      . LORazepam (ATIVAN) tablet 0.5 mg  0.5 mg Oral BID Johnn Hai, MD      . magnesium hydroxide (MILK OF MAGNESIA) suspension 30 mL  30 mL Oral Daily PRN Emmaline Kluver, FNP      . megestrol (MEGACE) tablet 40 mg  40 mg Oral Daily Sharma Covert, MD   40 mg at 12/02/18 0900  . OLANZapine zydis (ZYPREXA) disintegrating tablet 10 mg  10 mg Oral Q8H PRN Sharma Covert, MD       And  . ziprasidone (GEODON) injection 20 mg  20 mg Intramuscular PRN Sharma Covert, MD      . Derrill Memo ON 12/03/2018] perphenazine (TRILAFON) tablet 6 mg  6 mg Oral QHS Johnn Hai, MD      . simvastatin (ZOCOR) tablet 40 mg  40 mg Oral q1800 Sharma Covert, MD   40 mg at 12/01/18 1713  . traZODone (DESYREL) tablet 50 mg  50 mg Oral QHS PRN Emmaline Kluver, FNP   50 mg at 12/01/18  2100   PTA Medications: Medications Prior to Admission  Medication Sig Dispense Refill Last Dose  . acetaminophen (TYLENOL) 325 MG tablet Take 650 mg by mouth every 6 (six) hours as needed for mild pain or headache.     . albuterol (VENTOLIN HFA) 108 (90 Base) MCG/ACT inhaler Inhale 2 puffs into the lungs every 4 (four) hours as needed for shortness of breath or wheezing.     . cloZAPine (CLOZARIL) 25 MG tablet Take 50 mg by mouth at bedtime.     . cloZAPine (CLOZARIL) 50 MG tablet Take 1 tablet (50 mg total) by mouth at bedtime. (Patient not taking: Reported on 11/29/2018) 90 tablet 1   . gabapentin (NEURONTIN) 300 MG capsule Take 1 capsule (300 mg total) by mouth 3 (three) times daily. 90 capsule 1   . insulin NPH-regular Human (70-30) 100 UNIT/ML injection Inject 32 Units into the skin 2 (two) times daily with a meal.     . Multiple Vitamin (MULTIVITAMIN WITH MINERALS) TABS tablet Take 1 tablet by mouth daily.     . pantoprazole (PROTONIX) 40 MG tablet Take 40 mg by mouth daily.     Marland Kitchen  perphenazine (TRILAFON) 4 MG tablet Take 1 tablet (4 mg total) by mouth at bedtime. (Patient not taking: Reported on 11/29/2018) 90 tablet 1   . Rivaroxaban 15 & 20 MG TBPK Take as directed on package: Start with one 15mg  tablet by mouth twice a day with food. On Day 22, switch to one 20mg  tablet once a day with food. (Patient not taking: Reported on 07/29/2018) 51 each 0   . simvastatin (ZOCOR) 40 MG tablet Take 40 mg by mouth daily at 12 noon.      . traZODone (DESYREL) 50 MG tablet Take 1 tablet (50 mg total) by mouth at bedtime as needed for sleep. 90 tablet 1     Patient Stressors: Marital or family conflict Medication change or noncompliance  Patient Strengths: Technical sales engineer for treatment/growth  Treatment Modalities: Medication Management, Group therapy, Case management,  1 to 1 session with clinician, Psychoeducation, Recreational therapy.   Physician Treatment Plan for  Primary Diagnosis: <principal problem not specified> Long Term Goal(s): Improvement in symptoms so as ready for discharge Improvement in symptoms so as ready for discharge   Short Term Goals: Ability to identify changes in lifestyle to reduce recurrence of condition will improve Ability to verbalize feelings will improve Ability to disclose and discuss suicidal ideas Ability to demonstrate self-control will improve Ability to identify and develop effective coping behaviors will improve Ability to maintain clinical measurements within normal limits will improve Compliance with prescribed medications will improve Ability to identify changes in lifestyle to reduce recurrence of condition will improve Ability to verbalize feelings will improve Ability to disclose and discuss suicidal ideas Ability to demonstrate self-control will improve Ability to identify and develop effective coping behaviors will improve Ability to maintain clinical measurements within normal limits will improve Compliance with prescribed medications will improve  Medication Management: Evaluate patient's response, side effects, and tolerance of medication regimen.  Therapeutic Interventions: 1 to 1 sessions, Unit Group sessions and Medication administration.  Evaluation of Outcomes: Not Progressing  Physician Treatment Plan for Secondary Diagnosis: Active Problems:   Schizophrenia ([place])  Long Term Goal(s): Improvement in symptoms so as ready for discharge Improvement in symptoms so as ready for discharge   Short Term Goals: Ability to identify changes in lifestyle to reduce recurrence of condition will improve Ability to verbalize feelings will improve Ability to disclose and discuss suicidal ideas Ability to demonstrate self-control will improve Ability to identify and develop effective coping behaviors will improve Ability to maintain clinical measurements within normal limits will improve Compliance with  prescribed medications will improve Ability to identify changes in lifestyle to reduce recurrence of condition will improve Ability to verbalize feelings will improve Ability to disclose and discuss suicidal ideas Ability to demonstrate self-control will improve Ability to identify and develop effective coping behaviors will improve Ability to maintain clinical measurements within normal limits will improve Compliance with prescribed medications will improve     Medication Management: Evaluate patient's response, side effects, and tolerance of medication regimen.  Therapeutic Interventions: 1 to 1 sessions, Unit Group sessions and Medication administration.  Evaluation of Outcomes: Not Progressing   RN Treatment Plan for Primary Diagnosis: <principal problem not specified> Long Term Goal(s): Knowledge of disease and therapeutic regimen to maintain health will improve  Short Term Goals: Ability to participate in decision making will improve, Ability to verbalize feelings will improve, Ability to disclose and discuss suicidal ideas, Ability to identify and develop effective coping behaviors will improve and Compliance with prescribed  medications will improve  Medication Management: RN will administer medications as ordered by provider, will assess and evaluate patient's response and provide education to patient for prescribed medication. RN will report any adverse and/or side effects to prescribing provider.  Therapeutic Interventions: 1 on 1 counseling sessions, Psychoeducation, Medication administration, Evaluate responses to treatment, Monitor vital signs and CBGs as ordered, Perform/monitor CIWA, COWS, AIMS and Fall Risk screenings as ordered, Perform wound care treatments as ordered.  Evaluation of Outcomes: Not Progressing   LCSW Treatment Plan for Primary Diagnosis: <principal problem not specified> Long Term Goal(s): Safe transition to appropriate next level of care at discharge,  Engage patient in therapeutic group addressing interpersonal concerns.  Short Term Goals: Engage patient in aftercare planning with referrals and resources and Increase skills for wellness and recovery  Therapeutic Interventions: Assess for all discharge needs, 1 to 1 time with Social worker, Explore available resources and support systems, Assess for adequacy in community support network, Educate family and significant other(s) on suicide prevention, Complete Psychosocial Assessment, Interpersonal group therapy.  Evaluation of Outcomes: Not Progressing   Progress in Treatment: Attending groups: No. Participating in groups: No. Taking medication as prescribed: Yes. Toleration medication: Yes. Family/Significant other contact made: No, will contact:  will contact if given consent to contact Patient understands diagnosis: No. Discussing patient identified problems/goals with staff: Yes. Medical problems stabilized or resolved: Yes. Denies suicidal/homicidal ideation: Yes. Issues/concerns per patient self-inventory: No. Other:   New problem(s) identified: No, Describe:  None  New Short Term/Long Term Goal(s): Medication stabilization, elimination of SI thoughts, and development of a comprehensive mental wellness plan.   Patient Goals:  "Be better"  Discharge Plan or Barriers: CSW will continue to follow up for appropriate referrals and possible discharge planning  Reason for Continuation of Hospitalization: Aggression Hallucinations Medical Issues Withdrawal symptoms  Estimated Length of Stay: 5-7 days   Attendees: Patient: Debbie Dorsey 12/02/2018   Physician: Dr. Johnn Hai, MD 12/02/2018   Nursing: Legrand Como, RN 12/02/2018   RN Care Manager: 12/02/2018   Social Worker: Ardelle Anton, LCSW  12/02/2018   Recreational Therapist:  12/02/2018  Other:  12/02/2018  Other:  12/02/2018   Other: 12/02/2018     Scribe for Treatment Team: Trecia Rogers, LCSW 12/02/2018 1:06  PM

## 2018-12-02 NOTE — Progress Notes (Signed)
Patient had to be redirected away from nursing station constantly tonight. She wanted to leave and kept asking about her grandfather.He did visit on tonight.  She continued to ask for ginger ale or something sweet. Writer informed her that she would be drinking water the remainder of the night to help lower her blood sugar. She did better with toilet ing  tonight with mht reminding her to use the bathroom. She was compliant with medications scheduled. Safety maintained with 15 min checks.

## 2018-12-02 NOTE — Progress Notes (Signed)
[facility] MD Progress Note  12/02/2018 12:20 PM Debbie Dorsey  MRN:  [id number] Subjective:    Patient seen, she is rather scattered and has difficulty collecting her thoughts but overall was pleasant and trying to answer the questions.  She is alert and oriented to person and general situation her goal for treatment team is to "get better" she denies current auditory or visual hallucinations she asks about the whereabouts of her grandfather.  She denies wanted to harm her self she can repeat 0 of 3 and has a degree of thought blocking on the interview.  No involuntary movements  Principal Problem: Exacerbation of schizophrenic disorder Diagnosis: Active Problems:   Schizophrenia ([place])  Total Time spent with patient: 20 minutes  Past Psychiatric History: Last here / responded somewhat to clozapine  Past Medical History:  Past Medical History:  Diagnosis Date  . Diabetes mellitus without complication ([place])   . Schizophrenia ([place])    History reviewed. No pertinent surgical history. Family History:  Family History  Problem Relation Age of Onset  . Mental illness Sister    Family Psychiatric  History: No new data Social History:  Social History   Substance and Sexual Activity  Alcohol Use No     Social History   Substance and Sexual Activity  Drug Use No    Social History   Socioeconomic History  . Marital status: Single    Spouse name: Not on file  . Number of children: Not on file  . Years of education: Not on file  . Highest education level: Not on file  Occupational History  . Not on file  Social Needs  . Financial resource strain: Not on file  . Food insecurity    Worry: Not on file    Inability: Not on file  . Transportation needs    Medical: Not on file    Non-medical: Not on file  Tobacco Use  . Smoking status: Never Smoker  . Smokeless tobacco: Never Used  Substance and Sexual Activity  . Alcohol use: No  . Drug use: No  . Sexual activity: Not  Currently  Lifestyle  . Physical activity    Days per week: Not on file    Minutes per session: Not on file  . Stress: Not on file  Relationships  . Social Herbalist on phone: Not on file    Gets together: Not on file    Attends religious service: Not on file    Active member of club or organization: Not on file    Attends meetings of clubs or organizations: Not on file    Relationship status: Not on file  Other Topics Concern  . Not on file  Social History Narrative  . Not on file   Additional Social History:                         Sleep: Fair  Appetite:  Fair  Current Medications: Current Facility-Administered Medications  Medication Dose Route Frequency Provider Last Rate Last Dose  . acetaminophen (TYLENOL) tablet 650 mg  650 mg Oral Q6H PRN Emmaline Kluver, FNP      . albuterol (VENTOLIN HFA) 108 (90 Base) MCG/ACT inhaler 2 puff  2 puff Inhalation Q4H PRN Emmaline Kluver, FNP      . alum & mag hydroxide-simeth (MAALOX/MYLANTA) 200-200-20 MG/5ML suspension 30 mL  30 mL Oral Q4H PRN Emmaline Kluver, FNP      .  cloZAPine (CLOZARIL) tablet 50 mg  50 mg Oral QHS Sharma Covert, MD   50 mg at 12/01/18 2059  . gabapentin (NEURONTIN) capsule 300 mg  300 mg Oral TID Sharma Covert, MD   300 mg at 12/02/18 0900  . insulin aspart (novoLOG) injection 0-9 Units  0-9 Units Subcutaneous TID WC Sharma Covert, MD   9 Units at 12/01/18 1723  . insulin NPH Human (NOVOLIN N) injection 20 Units  20 Units Subcutaneous BID AC & HS Mallie Darting, Cordie Grice, MD      . LORazepam (ATIVAN) tablet 0.5 mg  0.5 mg Oral BID Johnn Hai, MD      . magnesium hydroxide (MILK OF MAGNESIA) suspension 30 mL  30 mL Oral Daily PRN Emmaline Kluver, FNP      . megestrol (MEGACE) tablet 40 mg  40 mg Oral Daily Sharma Covert, MD   40 mg at 12/02/18 0900  . OLANZapine zydis (ZYPREXA) disintegrating tablet 10 mg  10 mg Oral Q8H PRN Sharma Covert, MD       And  . ziprasidone (GEODON)  injection 20 mg  20 mg Intramuscular PRN Sharma Covert, MD      . Derrill Memo ON 12/03/2018] perphenazine (TRILAFON) tablet 6 mg  6 mg Oral QHS Johnn Hai, MD      . simvastatin (ZOCOR) tablet 40 mg  40 mg Oral q1800 Sharma Covert, MD   40 mg at 12/01/18 1713  . traZODone (DESYREL) tablet 50 mg  50 mg Oral QHS PRN Emmaline Kluver, FNP   50 mg at 12/01/18 2100    Lab Results:  Results for orders placed or performed during the hospital encounter of 11/29/18 (from the past 48 hour(s))  Glucose, capillary     Status: Abnormal   Collection Time: 11/30/18  6:15 PM  Result Value Ref Range   Glucose-Capillary 188 (H) 70 - 99 mg/dL  Glucose, capillary     Status: Abnormal   Collection Time: 11/30/18  7:35 PM  Result Value Ref Range   Glucose-Capillary 195 (H) 70 - 99 mg/dL  Glucose, capillary     Status: None   Collection Time: 12/01/18  6:06 AM  Result Value Ref Range   Glucose-Capillary 89 70 - 99 mg/dL  Glucose, capillary     Status: Abnormal   Collection Time: 12/01/18 12:09 PM  Result Value Ref Range   Glucose-Capillary 206 (H) 70 - 99 mg/dL  Glucose, capillary     Status: Abnormal   Collection Time: 12/01/18  5:10 PM  Result Value Ref Range   Glucose-Capillary 401 (H) 70 - 99 mg/dL  Glucose, capillary     Status: Abnormal   Collection Time: 12/01/18  5:12 PM  Result Value Ref Range   Glucose-Capillary 395 (H) 70 - 99 mg/dL   Comment 1 Notify RN   Glucose, capillary     Status: Abnormal   Collection Time: 12/01/18  7:30 PM  Result Value Ref Range   Glucose-Capillary 334 (H) 70 - 99 mg/dL  Glucose, capillary     Status: Abnormal   Collection Time: 12/02/18  6:08 AM  Result Value Ref Range   Glucose-Capillary 227 (H) 70 - 99 mg/dL  Glucose, capillary     Status: Abnormal   Collection Time: 12/02/18 11:37 AM  Result Value Ref Range   Glucose-Capillary 398 (H) 70 - 99 mg/dL    Blood Alcohol level:  Lab Results  Component Value Date   ETH <10  11/28/2018   ETH <10  [id number]    Metabolic Disorder Labs: Lab Results  Component Value Date   HGBA1C 8.5 (H) 07/29/2018   MPG 197 07/29/2018   MPG 191.51 05/26/2018   No results found for: PROLACTIN No results found for: CHOL, TRIG, HDL, CHOLHDL, VLDL, LDLCALC  Physical Findings: AIMS: Facial and Oral Movements Muscles of Facial Expression: None, normal Lips and Perioral Area: None, normal Jaw: None, normal Tongue: None, normal,Extremity Movements Upper (arms, wrists, hands, fingers): None, normal Lower (legs, knees, ankles, toes): None, normal, Trunk Movements Neck, shoulders, hips: None, normal, Overall Severity Severity of abnormal movements (highest score from questions above): None, normal Incapacitation due to abnormal movements: None, normal Patient's awareness of abnormal movements (rate only patient's report): No Awareness, Dental Status Current problems with teeth and/or dentures?: No Does patient usually wear dentures?: No  CIWA:  CIWA-Ar Total: 1 COWS:  COWS Total Score: 2  Musculoskeletal: Strength & Muscle Tone: within normal limits Gait & Station: normal Patient leans: N/A  Psychiatric Specialty Exam: Physical Exam  ROS  Blood pressure 92/74, pulse 92, temperature 97.7 F (36.5 C), temperature source Oral, resp. rate 18, height 4' 10.5" (1.486 m), weight 43.5 kg, SpO2 99 %, unknown if currently breastfeeding.Body mass index is 19.72 kg/m.  General Appearance: Casual  Eye Contact:  Minimal  Speech:  Slow and Slurred  Volume:  Decreased  Mood:  Dysphoric  Affect:  Constricted  Thought Process:  Disorganized and Descriptions of Associations: Circumstantial  Orientation:  Other:  Person place situation  Thought Content:  Too disorganized to discern but denies hallucinations  Suicidal Thoughts:  No  Homicidal Thoughts:  No  Memory:  Immediate;   Poor Recent;   Poor Remote;   Fair  Judgement:  Impaired  Insight:  Fair  Psychomotor Activity:  Decreased  Concentration:   Concentration: Poor and Attention Span: Poor  Recall:  Poor  Fund of Knowledge:  Poor  Language:  Poor  Akathisia:  Negative  Handed:  Right  AIMS (if indicated):     Assets:  Communication Skills Desire for Improvement  ADL's:  Intact  Cognition:  WNL  Sleep:  Number of Hours: 6.75     Treatment Plan Summary: Daily contact with patient to assess and evaluate symptoms and progress in treatment and Medication management  Continue cognitive therapy reality based therapy escalate perphenazine to 6 mg a day continue clozapine again it may take some time for the patient to stabilize/add lorazepam low-dose  Johnn Hai, MD 12/02/2018, 12:20 PM

## 2018-12-02 NOTE — Progress Notes (Signed)
D: Pt focused on seeing her Grandfather. Pt isolated to her room this evening.   A: Pt was offered support and encouragement. Pt was given scheduled medications. Pt was encourage to attend groups. Q 15 minute checks were done for safety.   R:Pt attends groups and interacts well with peers and staff. Pt is taking medication. Pt has no complaints.Pt receptive to treatment and safety maintained on unit.

## 2018-12-02 NOTE — Progress Notes (Signed)
Recreation Therapy Notes  10.26.20  1130:  LRT attempted to complete patients recreation therapy assessment.  Pt was unable to focus or answer questions presented.  LRT will attempt to conduct assessment at a later time.    Victorino Sparrow, LRT/CTRS    Victorino Sparrow A 12/02/2018 12:18 PM

## 2018-12-02 NOTE — Progress Notes (Signed)
Recreation Therapy Notes  Date: 10.26.20 Time: 1006 Location: 500 Hall Dayroom  Group Topic: Wellness  Goal Area(s) Addresses:  Patient will define components of whole wellness. Patient will verbalize benefit of whole wellness.  Intervention: Music  Activity:  Exercise.  LRT led group in a series of stretches.  Each member the group got the chance to lead an exercise of their choice.  The group was to complete 3 rounds of exercise or 30 minutes of exercise.  Patients were instructed to take breaks and drink water as needed.  Education: Wellness, Dentist.   Education Outcome: Acknowledges education/In group clarification offered/Needs additional education.   Clinical Observations/Feedback:  Pt did not attend group session.     Victorino Sparrow, LRT/CTRS    [name], Wiley Flicker A 12/02/2018 11:06 AM

## 2018-12-02 NOTE — Progress Notes (Signed)
Patient refused her 1700 medications, refused CBG.  Patient waived her hands at nurses by her bedside.  RN Will talked to patient and she agreed to [facility] and did take her medications.   Patient's grandfather came to visit patient tonight and was asked to leave.  Grandfather will call MD tomorrow.

## 2018-12-03 DIAGNOSIS — F200 Paranoid schizophrenia: Secondary | ICD-10-CM | POA: Diagnosis not present

## 2018-12-03 LAB — GLUCOSE, CAPILLARY
Glucose-Capillary: 132 mg/dL — ABNORMAL HIGH (ref 70–99)
Glucose-Capillary: 426 mg/dL — ABNORMAL HIGH (ref 70–99)
Glucose-Capillary: 377 mg/dL — ABNORMAL HIGH (ref 70–99)
Glucose-Capillary: 251 mg/dL — ABNORMAL HIGH (ref 70–99)

## 2018-12-03 MED ORDER — PERPHENAZINE 2 MG PO TABS
2.0000 mg | ORAL_TABLET | Freq: Every day | ORAL | Status: DC
  Administered 2018-12-03: 08:00:00 2 mg via ORAL
  Filled 2018-12-03 (×3): qty 1

## 2018-12-03 MED ORDER — MIRTAZAPINE 15 MG PO TBDP
15.0000 mg | ORAL_TABLET | Freq: Every day | ORAL | Status: DC
  Administered 2018-12-04 – 2018-12-05 (×2): 15 mg via ORAL
  Filled 2018-12-03 (×5): qty 1

## 2018-12-03 NOTE — Progress Notes (Signed)
D: Pt denies SI/HI/AVH. Pt is pleasant and cooperative. Pt stays in her bed and continues to ask for her grandfather A: Pt was offered support and encouragement. Pt was given scheduled medications. Pt was encourage to attend groups. Q 15 minute checks were done for safety.  R:Pt attends groups and interacts well with peers and staff. Pt is taking medication. Pt has no complaints.Pt receptive to treatment and safety maintained on unit.

## 2018-12-03 NOTE — Progress Notes (Signed)
Inpatient Diabetes Program Recommendations  AACE/ADA: New Consensus Statement on Inpatient Glycemic Control (2015)  Target Ranges:  Prepandial:   less than 140 mg/dL      Peak postprandial:   less than 180 mg/dL (1-2 hours)      Critically ill patients:  140 - 180 mg/dL   Lab Results  Component Value Date   GLUCAP 426 (H) 12/03/2018   HGBA1C 8.5 (H) 07/29/2018    Review of Glycemic Control  Post-prandials elevated. On 70/30 32 units bid at home.  Will need meal coverage with NPH.  Inpatient Diabetes Program Recommendations:     Add Novolog 8 units tidwc for meal coverage insulin Increase NPH to 25 units bid  Follow trends.  Thank you. Lorenda Peck, RD, LDN, CDE Inpatient Diabetes Coordinator [phone]

## 2018-12-03 NOTE — Progress Notes (Signed)
Adult Psychoeducational Group Note  Date:  12/03/2018 Time:  2:40 AM  Group Topic/Focus:  Wrap-Up Group:   The focus of this group is to help patients review their daily goal of treatment and discuss progress on daily workbooks.  Participation Level:  Did Not Attend  Participation Quality:  Did Not Attend  Affect:  Did Not Attend  Cognitive:  Did Not Attend  Insight: None  Engagement in Group:  Did Not Attend  Modes of Intervention:  Did Not Attend  Additional Comments: Pt did not attend tonight's wrap up group.  Candy Sledge 12/03/2018, 2:40 AM

## 2018-12-03 NOTE — B[facility] Counselor (Signed)
Patient was actively asleep and would not wake for assessment with CSW. This is the 3rd attempt with patient.   CSW was met by [place] to interview the patient for a report that was made to APS. Patient was asleep. CSW provided DSS with additional documentation. DSS reported they will come back later in the evening once the patient is more awake or the following day.

## 2018-12-03 NOTE — Progress Notes (Signed)
[facility] MD Progress Note  12/03/2018 7:37 AM Debbie Dorsey  MRN:  [id number] Subjective:   Patient is more disorganized, she begins the interview with "where am I" and then asks repeatedly to call her grandfather. She leaves the interview after just a couple of minutes to go back on the door and asked to leave.  She is told she cannot leave the ward.  She follows me into other rooms when I am rounding on other patients and needs constant redirection.  No matter what questions or asks she simply asks about her grandfather  Principal Problem: Relapse regarding schizophrenic disorder Diagnosis: Active Problems:   Schizophrenia ([place])  Total Time spent with patient: 20 minutes  Past Psychiatric History: Multiple past admissions  Past Medical History:  Past Medical History:  Diagnosis Date  . Diabetes mellitus without complication ([place])   . Schizophrenia ([place])    History reviewed. No pertinent surgical history. Family History:  Family History  Problem Relation Age of Onset  . Mental illness Sister    Family Psychiatric  History: No new data Social History:  Social History   Substance and Sexual Activity  Alcohol Use No     Social History   Substance and Sexual Activity  Drug Use No    Social History   Socioeconomic History  . Marital status: Single    Spouse name: Not on file  . Number of children: Not on file  . Years of education: Not on file  . Highest education level: Not on file  Occupational History  . Not on file  Social Needs  . Financial resource strain: Not on file  . Food insecurity    Worry: Not on file    Inability: Not on file  . Transportation needs    Medical: Not on file    Non-medical: Not on file  Tobacco Use  . Smoking status: Never Smoker  . Smokeless tobacco: Never Used  Substance and Sexual Activity  . Alcohol use: No  . Drug use: No  . Sexual activity: Not Currently  Lifestyle  . Physical activity    Days per week: Not on file    Minutes per session: Not on file  . Stress: Not on file  Relationships  . Social Herbalist on phone: Not on file    Gets together: Not on file    Attends religious service: Not on file    Active member of club or organization: Not on file    Attends meetings of clubs or organizations: Not on file    Relationship status: Not on file  Other Topics Concern  . Not on file  Social History Narrative  . Not on file    Sleep: poor   Appetite:  Fair  Current Medications: Current Facility-Administered Medications  Medication Dose Route Frequency Provider Last Rate Last Dose  . acetaminophen (TYLENOL) tablet 650 mg  650 mg Oral Q6H PRN Emmaline Kluver, FNP      . albuterol (VENTOLIN HFA) 108 (90 Base) MCG/ACT inhaler 2 puff  2 puff Inhalation Q4H PRN Emmaline Kluver, FNP      . alum & mag hydroxide-simeth (MAALOX/MYLANTA) 200-200-20 MG/5ML suspension 30 mL  30 mL Oral Q4H PRN Emmaline Kluver, FNP      . cloZAPine (CLOZARIL) tablet 50 mg  50 mg Oral QHS Sharma Covert, MD   50 mg at 12/02/18 2024  . gabapentin (NEURONTIN) capsule 300 mg  300 mg Oral TID Mallie Darting,  Cordie Grice, MD   300 mg at 12/02/18 1646  . insulin aspart (novoLOG) injection 0-9 Units  0-9 Units Subcutaneous TID WC Sharma Covert, MD   1 Units at 12/03/18 [phone]  . insulin NPH Human (NOVOLIN N) injection 20 Units  20 Units Subcutaneous BID AC & HS Sharma Covert, MD   20 Units at 12/02/18 2023  . LORazepam (ATIVAN) tablet 0.5 mg  0.5 mg Oral BID Johnn Hai, MD   0.5 mg at 12/02/18 1647  . magnesium hydroxide (MILK OF MAGNESIA) suspension 30 mL  30 mL Oral Daily PRN Emmaline Kluver, FNP      . megestrol (MEGACE) tablet 40 mg  40 mg Oral Daily Sharma Covert, MD   40 mg at 12/02/18 0900  . OLANZapine zydis (ZYPREXA) disintegrating tablet 10 mg  10 mg Oral Q8H PRN Sharma Covert, MD       And  . ziprasidone (GEODON) injection 20 mg  20 mg Intramuscular PRN Sharma Covert, MD      . perphenazine (TRILAFON)  tablet 2 mg  2 mg Oral Daily Johnn Hai, MD      . perphenazine (TRILAFON) tablet 6 mg  6 mg Oral QHS Johnn Hai, MD      . simvastatin (ZOCOR) tablet 40 mg  40 mg Oral q1800 Sharma Covert, MD   40 mg at 12/02/18 1806  . traZODone (DESYREL) tablet 50 mg  50 mg Oral QHS PRN Emmaline Kluver, FNP   50 mg at 12/02/18 2024    Lab Results:  Results for orders placed or performed during the hospital encounter of 11/29/18 (from the past 48 hour(s))  Glucose, capillary     Status: Abnormal   Collection Time: 12/01/18 12:09 PM  Result Value Ref Range   Glucose-Capillary 206 (H) 70 - 99 mg/dL  Glucose, capillary     Status: Abnormal   Collection Time: 12/01/18  5:10 PM  Result Value Ref Range   Glucose-Capillary 401 (H) 70 - 99 mg/dL  Glucose, capillary     Status: Abnormal   Collection Time: 12/01/18  5:12 PM  Result Value Ref Range   Glucose-Capillary 395 (H) 70 - 99 mg/dL   Comment 1 Notify RN   Glucose, capillary     Status: Abnormal   Collection Time: 12/01/18  7:30 PM  Result Value Ref Range   Glucose-Capillary 334 (H) 70 - 99 mg/dL  Glucose, capillary     Status: Abnormal   Collection Time: 12/02/18  6:08 AM  Result Value Ref Range   Glucose-Capillary 227 (H) 70 - 99 mg/dL  Glucose, capillary     Status: Abnormal   Collection Time: 12/02/18 11:37 AM  Result Value Ref Range   Glucose-Capillary 398 (H) 70 - 99 mg/dL  Glucose, capillary     Status: Abnormal   Collection Time: 12/02/18  5:57 PM  Result Value Ref Range   Glucose-Capillary 384 (H) 70 - 99 mg/dL  Glucose, capillary     Status: Abnormal   Collection Time: 12/02/18  7:31 PM  Result Value Ref Range   Glucose-Capillary 229 (H) 70 - 99 mg/dL  Glucose, capillary     Status: Abnormal   Collection Time: 12/03/18  6:06 AM  Result Value Ref Range   Glucose-Capillary 132 (H) 70 - 99 mg/dL    Blood Alcohol level:  Lab Results  Component Value Date   ETH <10 11/28/2018   ETH <10 [id number]    Metabolic Disorder  Labs: Lab Results  Component Value Date   HGBA1C 8.5 (H) 07/29/2018   MPG 197 07/29/2018   MPG 191.51 05/26/2018   No results found for: PROLACTIN No results found for: CHOL, TRIG, HDL, CHOLHDL, VLDL, LDLCALC  Physical Findings: AIMS: Facial and Oral Movements Muscles of Facial Expression: None, normal Lips and Perioral Area: None, normal Jaw: None, normal Tongue: None, normal,Extremity Movements Upper (arms, wrists, hands, fingers): None, normal Lower (legs, knees, ankles, toes): None, normal, Trunk Movements Neck, shoulders, hips: None, normal, Overall Severity Severity of abnormal movements (highest score from questions above): None, normal Incapacitation due to abnormal movements: None, normal Patient's awareness of abnormal movements (rate only patient's report): No Awareness, Dental Status Current problems with teeth and/or dentures?: No Does patient usually wear dentures?: No  CIWA:  CIWA-Ar Total: 1 COWS:  COWS Total Score: 2  Musculoskeletal: Strength & Muscle Tone: within normal limits Gait & Station: normal Patient leans: N/A  Psychiatric Specialty Exam: Physical Exam  ROS  Blood pressure 99/68, pulse (!) 128, temperature 97.9 F (36.6 C), temperature source Oral, resp. rate 18, height 4' 10.5" (1.486 m), weight 43.5 kg, SpO2 99 %, unknown if currently breastfeeding.Body mass index is 19.72 kg/m.  General Appearance: Casual  Eye Contact:  Fair  Speech:  Pressured  Volume:  Decreased  Mood:  Anxious  Affect:  Restricted  Thought Process:  Disorganized, Irrelevant and Descriptions of Associations: Circumstantial  Orientation:  Other:  Unaware of day date or time  Thought Content:  Illogical and Delusions  Suicidal Thoughts:  No  Homicidal Thoughts:  No  Memory:  Immediate;   Poor Recent;   Poor Remote;   Poor  Judgement:  Impaired  Insight:  Lacking  Psychomotor Activity:  Decreased with bursts of activity that is random  Concentration:  Concentration:  Poor and Attention Span: Poor  Recall:  Poor  Fund of Knowledge:  Poor  Language:  Poor  Akathisia:  Negative  Handed:  Right  AIMS (if indicated):     Assets:  Housing Leisure Time Physical Health  ADL's:  Intact  Cognition:  WNL  Sleep:  Number of Hours: 1.75     Treatment Plan Summary: Daily contact with patient to assess and evaluate symptoms and progress in treatment and Medication management Sleep is quite poor she will need a sleep aid but she is hypotensive at baseline.  Also tachycardic at baseline.  We will escalate perphenazine as well due to psychosis continue reality based therapy and one-to-one precautions as needed   Bearett Porcaro, MD 12/03/2018, 7:37 AM

## 2018-12-04 DIAGNOSIS — F200 Paranoid schizophrenia: Secondary | ICD-10-CM | POA: Diagnosis not present

## 2018-12-04 LAB — GLUCOSE, CAPILLARY
Glucose-Capillary: 128 mg/dL — ABNORMAL HIGH (ref 70–99)
Glucose-Capillary: 253 mg/dL — ABNORMAL HIGH (ref 70–99)
Glucose-Capillary: 440 mg/dL — ABNORMAL HIGH (ref 70–99)
Glucose-Capillary: 311 mg/dL — ABNORMAL HIGH (ref 70–99)

## 2018-12-04 MED ORDER — INSULIN ASPART 100 UNIT/ML ~~LOC~~ SOLN
8.0000 [IU] | Freq: Three times a day (TID) | SUBCUTANEOUS | Status: DC
  Administered 2018-12-04 – 2018-12-06 (×6): 8 [IU] via SUBCUTANEOUS

## 2018-12-04 MED ORDER — PERPHENAZINE 2 MG PO TABS
2.0000 mg | ORAL_TABLET | Freq: Two times a day (BID) | ORAL | Status: DC
  Administered 2018-12-04: 2 mg via ORAL
  Filled 2018-12-04 (×4): qty 1

## 2018-12-04 MED ORDER — LORAZEPAM 0.5 MG PO TABS
0.5000 mg | ORAL_TABLET | Freq: Three times a day (TID) | ORAL | Status: DC
  Administered 2018-12-04 – 2018-12-06 (×5): 0.5 mg via ORAL
  Filled 2018-12-04 (×5): qty 1

## 2018-12-04 MED ORDER — INSULIN NPH (HUMAN) (ISOPHANE) 100 UNIT/ML ~~LOC~~ SUSP
25.0000 [IU] | Freq: Two times a day (BID) | SUBCUTANEOUS | Status: DC
  Administered 2018-12-04 – 2018-12-06 (×4): 25 [IU] via SUBCUTANEOUS

## 2018-12-04 NOTE — Progress Notes (Signed)
This [name] just discovered that the patient urinated on the chair and onto the floor in the dayroom. Floor was cleaned up and the patient was taken to her bathroom. Nurse has been notified.

## 2018-12-04 NOTE — Progress Notes (Signed)
Recreation Therapy Notes  10.28.20 LRT made a second attempt to conduct recreation therapy assessment with patient.  Patient was still unable to focus and answer questions.  Pt was preoccupied with other things.  LRT will make another attempt to assess patient at a later time.    Victorino Sparrow, LRT/CTRS    [name], Sriman Tally A 12/04/2018 1:17 PM

## 2018-12-04 NOTE — Progress Notes (Signed)
[facility] MD Progress Note  12/04/2018 8:22 AM Debbie Dorsey  MRN:  [id number] Subjective:    Patient was generally out of control behaviorally yesterday, lunging at the examiner grabbing the examiner demanding to leave demanding to see her grandfather so forth. She did urinate on herself from the bed she is still intrusive and pressured and rambling but denies hallucinations when questioned specifically but makes few form sentences other than "talk to my grandfather" and "give me my grandfather"  Principal Problem: Exacerbation of chronic schizophrenic disorder Diagnosis: Active Problems:   Schizophrenia ([place])  Total Time spent with patient: 20 minutes  Past Psychiatric History: Prior similar episodes of confusional psychosis  Past Medical History:  Past Medical History:  Diagnosis Date  . Diabetes mellitus without complication ([place])   . Schizophrenia ([place])    History reviewed. No pertinent surgical history. Family History:  Family History  Problem Relation Age of Onset  . Mental illness Sister    Family Psychiatric  History: No new data Social History:  Social History   Substance and Sexual Activity  Alcohol Use No     Social History   Substance and Sexual Activity  Drug Use No    Social History   Socioeconomic History  . Marital status: Single    Spouse name: Not on file  . Number of children: Not on file  . Years of education: Not on file  . Highest education level: Not on file  Occupational History  . Not on file  Social Needs  . Financial resource strain: Not on file  . Food insecurity    Worry: Not on file    Inability: Not on file  . Transportation needs    Medical: Not on file    Non-medical: Not on file  Tobacco Use  . Smoking status: Never Smoker  . Smokeless tobacco: Never Used  Substance and Sexual Activity  . Alcohol use: No  . Drug use: No  . Sexual activity: Not Currently  Lifestyle  . Physical activity    Days per week: Not on file     Minutes per session: Not on file  . Stress: Not on file  Relationships  . Social Herbalist on phone: Not on file    Gets together: Not on file    Attends religious service: Not on file    Active member of club or organization: Not on file    Attends meetings of clubs or organizations: Not on file    Relationship status: Not on file  Other Topics Concern  . Not on file  Social History Narrative  . Not on file   Additional Social History:                         Sleep: Good  Appetite:  Good  Current Medications: Current Facility-Administered Medications  Medication Dose Route Frequency Provider Last Rate Last Dose  . acetaminophen (TYLENOL) tablet 650 mg  650 mg Oral Q6H PRN Emmaline Kluver, FNP      . albuterol (VENTOLIN HFA) 108 (90 Base) MCG/ACT inhaler 2 puff  2 puff Inhalation Q4H PRN Emmaline Kluver, FNP      . alum & mag hydroxide-simeth (MAALOX/MYLANTA) 200-200-20 MG/5ML suspension 30 mL  30 mL Oral Q4H PRN Emmaline Kluver, FNP      . insulin aspart (novoLOG) injection 0-9 Units  0-9 Units Subcutaneous TID WC Sharma Covert, MD   9 Units  at 12/03/18 1731  . insulin NPH Human (NOVOLIN N) injection 20 Units  20 Units Subcutaneous BID AC & HS Sharma Covert, MD   20 Units at 12/03/18 2042  . LORazepam (ATIVAN) tablet 0.5 mg  0.5 mg Oral TID Johnn Hai, MD      . magnesium hydroxide (MILK OF MAGNESIA) suspension 30 mL  30 mL Oral Daily PRN Emmaline Kluver, FNP      . megestrol (MEGACE) tablet 40 mg  40 mg Oral Daily Sharma Covert, MD   40 mg at 12/03/18 [id number]  . mirtazapine (REMERON SOL-TAB) disintegrating tablet 15 mg  15 mg Oral QHS Johnn Hai, MD      . OLANZapine zydis [facility]) disintegrating tablet 10 mg  10 mg Oral Q8H PRN Sharma Covert, MD      . perphenazine (TRILAFON) tablet 2 mg  2 mg Oral BID Johnn Hai, MD      . perphenazine (TRILAFON) tablet 6 mg  6 mg Oral QHS Johnn Hai, MD   6 mg at 12/03/18 2042  . simvastatin (ZOCOR) tablet  40 mg  40 mg Oral q1800 Sharma Covert, MD   40 mg at 12/03/18 1731  . traZODone (DESYREL) tablet 50 mg  50 mg Oral QHS PRN Emmaline Kluver, FNP   50 mg at 12/02/18 2024    Lab Results:  Results for orders placed or performed during the hospital encounter of 11/29/18 (from the past 48 hour(s))  Glucose, capillary     Status: Abnormal   Collection Time: 12/02/18 11:37 AM  Result Value Ref Range   Glucose-Capillary 398 (H) 70 - 99 mg/dL  Glucose, capillary     Status: Abnormal   Collection Time: 12/02/18  5:57 PM  Result Value Ref Range   Glucose-Capillary 384 (H) 70 - 99 mg/dL  Glucose, capillary     Status: Abnormal   Collection Time: 12/02/18  7:31 PM  Result Value Ref Range   Glucose-Capillary 229 (H) 70 - 99 mg/dL  Glucose, capillary     Status: Abnormal   Collection Time: 12/03/18  6:06 AM  Result Value Ref Range   Glucose-Capillary 132 (H) 70 - 99 mg/dL  Glucose, capillary     Status: Abnormal   Collection Time: 12/03/18 12:23 PM  Result Value Ref Range   Glucose-Capillary 426 (H) 70 - 99 mg/dL  Glucose, capillary     Status: Abnormal   Collection Time: 12/03/18  5:28 PM  Result Value Ref Range   Glucose-Capillary 377 (H) 70 - 99 mg/dL  Glucose, capillary     Status: Abnormal   Collection Time: 12/03/18  7:31 PM  Result Value Ref Range   Glucose-Capillary 251 (H) 70 - 99 mg/dL  Glucose, capillary     Status: Abnormal   Collection Time: 12/04/18  5:39 AM  Result Value Ref Range   Glucose-Capillary 128 (H) 70 - 99 mg/dL    Blood Alcohol level:  Lab Results  Component Value Date   ETH <10 11/28/2018   ETH <10 [id number]    Metabolic Disorder Labs: Lab Results  Component Value Date   HGBA1C 8.5 (H) 07/29/2018   MPG 197 07/29/2018   MPG 191.51 05/26/2018   No results found for: PROLACTIN No results found for: CHOL, TRIG, HDL, CHOLHDL, VLDL, LDLCALC  Physical Findings: AIMS: Facial and Oral Movements Muscles of Facial Expression: None, normal Lips and  Perioral Area: None, normal Jaw: None, normal Tongue: None, normal,Extremity Movements Upper (arms, wrists, hands,  fingers): None, normal Lower (legs, knees, ankles, toes): None, normal, Trunk Movements Neck, shoulders, hips: None, normal, Overall Severity Severity of abnormal movements (highest score from questions above): None, normal Incapacitation due to abnormal movements: None, normal Patient's awareness of abnormal movements (rate only patient's report): No Awareness, Dental Status Current problems with teeth and/or dentures?: No Does patient usually wear dentures?: No  CIWA:  CIWA-Ar Total: 1 COWS:  COWS Total Score: 2  Musculoskeletal: Strength & Muscle Tone: within normal limits Gait & Station: normal Patient leans: N/A  Psychiatric Specialty Exam: Physical Exam  ROS  Blood pressure 99/68, pulse (!) 128, temperature 97.9 F (36.6 C), temperature source Oral, resp. rate 18, height 4' 10.5" (1.486 m), weight 43.5 kg, SpO2 99 %, unknown if currently breastfeeding.Body mass index is 19.72 kg/m.  General Appearance: Disheveled  Eye Contact:  Fair  Speech:  Garbled and Pressured  Volume:  Increased  Mood:  Anxious and Manic  Affect:  Congruent  Thought Process:  Disorganized, Irrelevant and Descriptions of Associations: Circumstantial  Orientation:  Other:  Will not articulate  Thought Content:  Illogical and Delusions  Suicidal Thoughts:  No  Homicidal Thoughts:  No  Memory:  Immediate;   Poor Recent;   Poor Remote;   Poor  Judgement:  Impaired  Insight:  Present very small degree  Psychomotor Activity:  Increased  Concentration:  Concentration: Poor and Attention Span: Poor  Recall:  Poor  Fund of Knowledge:  Poor  Language:  Fair  Akathisia:  Negative  Handed:  Right  AIMS (if indicated):     Assets:  Armed forces logistics/support/administrative officer Housing Leisure Time Physical Health Resilience Social Support  ADL's:  Intact  Cognition:  WNL  Sleep:  Number of Hours: 9.75    Treatment Plan Summary: Daily contact with patient to assess and evaluate symptoms and progress in treatment and Medication management  Due to hypotension and diabetes probably best to avoid clozapine because compliance will be an issue as an outpatient as well escalate Trilafon escalate lorazepam continue current precautions and reality based therapy Also agree with recommendations for NovoLog/NPH additions  Mikiala Fugett, MD 12/04/2018, 8:22 AM

## 2018-12-04 NOTE — [facility] Suicide Risk Assessment (Signed)
[facility] INPATIENT:  Family/Significant Other Suicide Prevention Education  Suicide Prevention Education:  Education Completed; Pt's grandfather, Teryl Lucy, has been identified by the patient as the family member/significant other with whom the patient will be residing, and identified as the person(s) who will aid the patient in the event of a mental health crisis (suicidal ideations/suicide attempt).  With written consent from the patient, the family member/significant other has been provided the following suicide prevention education, prior to the and/or following the discharge of the patient.  The suicide prevention education provided includes the following:  Suicide risk factors  Suicide prevention and interventions  National Suicide Hotline telephone number  [facility] assessment telephone number  [facility] Emergency Assistance [place] and/or Residential Mobile Crisis Unit telephone number  Request made of family/significant other to:  Remove weapons (e.g., guns, rifles, knives), all items previously/currently identified as safety concern.    Remove drugs/medications (over-the-counter, prescriptions, illicit drugs), all items previously/currently identified as a safety concern.  The family member/significant other verbalizes understanding of the suicide prevention education information provided.  The family member/significant other agrees to remove the items of safety concern listed above.   CSW contacted pt's grandfather, Teryl Lucy. Pt's grandfather asked for an update and stated that he is really concerned. Pt's grandfather stated that right before she came to the hospital that she was urinating and having bowel movements on herself. Pt's grandfather stated that she was throwing objects around the house and giving him short answers when he asked her questions. He stated that it was the reason that he called the police. Pt's grandfather stated  that she was taking her medications real good until a few days before he had to call the police. He stated that she would refuse to take her medications and would take it and throw it across the room and refuse. Pt's grandfather stated that he is used to her being happy and now she is out of control. He stated, "She is not wanting to listen. Something is going on".  Pt's grandfather confirmed that the patient goes to [facility] and has an ACTT team. Patient asked for updates on the patient.   Trecia Rogers 12/04/2018, 10:27 AM

## 2018-12-04 NOTE — Progress Notes (Signed)
Adult Psychoeducational Group Note  Date:  12/04/2018 Time:  9:06 PM  Group Topic/Focus:  Wrap-Up Group:   The focus of this group is to help patients review their daily goal of treatment and discuss progress on daily workbooks.  Participation Level:  Minimal  Participation Quality:  Appropriate  Affect:  Appropriate  Cognitive:  Lacking  Insight: Lacking  Engagement in Group:  Engaged  Modes of Intervention:  Education and Support  Additional Comments:  Patient attended and participated in group tonight. She reports that today she stayed in the dayroom all day. She also eat her meals.  [facility] 12/04/2018, 9:06 PM

## 2018-12-04 NOTE — Progress Notes (Signed)
Adult Psychoeducational Group Note  Date:  12/04/2018 Time:  12:06 AM  Group Topic/Focus:  Wrap-Up Group:   The focus of this group is to help patients review their daily goal of treatment and discuss progress on daily workbooks.  Participation Level:  Did Not Attend  Participation Quality:  Did Not Attend  Affect:  Did Not Attend  Cognitive:  Did Not Attend  Insight: None  Engagement in Group:  Did Not Attend  Modes of Intervention:  Did Not Attend  Additional Comments:  Pt did not attend evening wrap up group tonight.  Candy Sledge 12/04/2018, 12:06 AM

## 2018-12-04 NOTE — Progress Notes (Signed)
Per previous shift pt has been urinating on herself on the unit. Pt appeared to have gotten up from the dayroom and urine was found where she was sitting.

## 2018-12-04 NOTE — Progress Notes (Signed)
D: Pt asked " who can I talk to about getting right". Pt stated that [facility] comes out to help her with her medications, pt was encouraged to continue her medications and talk to the social worker about her outside schedule. Pt has been urinating on the unit this evening and pt was given adult diapers to help keep the situation under control.  A: Pt was offered support and encouragement. Pt was given scheduled medications. Pt was encourage to attend groups. Q 15 minute checks were done for safety.  R: safety maintained on unit.

## 2018-12-04 NOTE — Progress Notes (Signed)
Recreation Therapy Notes  Date: 10.28.20 Time: 1000 Location: 500 Hall Dayroom  Group Topic: Coping Skills  Goal Area(s) Addresses:  Patient will identify positive coping skills. Patient will identify benefits of using coping skills.  Intervention:  Worksheet, pencils  Activity:  Healthy vs Unhealthy Coping Strategies.  Patients were to identify a current problem they are facing.  Patients were to then identify unhealthy coping strategies used and the consequences.  Patients then identified healthy coping strategies, expected outcomes and barriers to using healthy coping strategies.    Education: Radiographer, therapeutic, Dentist.   Education Outcome: Acknowledges understanding/In group clarification offered/Needs additional education.   Clinical Observations/Feedback: Pt did not attend group.    Victorino Sparrow, LRT/CTRS         Victorino Sparrow A 12/04/2018 12:50 PM

## 2018-12-05 DIAGNOSIS — F200 Paranoid schizophrenia: Secondary | ICD-10-CM | POA: Diagnosis not present

## 2018-12-05 LAB — CBC WITH DIFFERENTIAL/PLATELET
Abs Immature Granulocytes: 0.02 10*3/uL (ref 0.00–0.07)
Basophils Absolute: 0.1 10*3/uL (ref 0.0–0.1)
Basophils Relative: 1 %
Eosinophils Absolute: 0.2 10*3/uL (ref 0.0–0.5)
Eosinophils Relative: 3 %
HCT: 36.8 % (ref 36.0–46.0)
Hemoglobin: 11.8 g/dL — ABNORMAL LOW (ref 12.0–15.0)
Immature Granulocytes: 0 %
Lymphocytes Relative: 45 %
Lymphs Abs: 2.8 10*3/uL (ref 0.7–4.0)
MCH: 31.7 pg (ref 26.0–34.0)
MCHC: 32.1 g/dL (ref 30.0–36.0)
MCV: 98.9 fL (ref 80.0–100.0)
Monocytes Absolute: 0.5 10*3/uL (ref 0.1–1.0)
Monocytes Relative: 7 %
Neutro Abs: 2.7 10*3/uL (ref 1.7–7.7)
Neutrophils Relative %: 44 %
Platelets: 396 10*3/uL (ref 150–400)
RBC: 3.72 MIL/uL — ABNORMAL LOW (ref 3.87–5.11)
RDW: 12.6 % (ref 11.5–15.5)
WBC: 6.1 10*3/uL (ref 4.0–10.5)
nRBC: 0 % (ref 0.0–0.2)

## 2018-12-05 LAB — GLUCOSE, CAPILLARY
Glucose-Capillary: 155 mg/dL — ABNORMAL HIGH (ref 70–99)
Glucose-Capillary: 319 mg/dL — ABNORMAL HIGH (ref 70–99)
Glucose-Capillary: 365 mg/dL — ABNORMAL HIGH (ref 70–99)
Glucose-Capillary: 311 mg/dL — ABNORMAL HIGH (ref 70–99)

## 2018-12-05 LAB — HEMOGLOBIN A1C
Hgb A1c MFr Bld: 9.8 % — ABNORMAL HIGH (ref 4.8–5.6)
Mean Plasma Glucose: 234.56 mg/dL

## 2018-12-05 MED ORDER — ZIPRASIDONE MESYLATE 20 MG IM SOLR
INTRAMUSCULAR | Status: AC
  Administered 2018-12-05: 20 mg via INTRAMUSCULAR
  Filled 2018-12-05: qty 20

## 2018-12-05 MED ORDER — PERPHENAZINE 4 MG PO TABS
4.0000 mg | ORAL_TABLET | Freq: Two times a day (BID) | ORAL | Status: DC
  Administered 2018-12-05 – 2018-12-06 (×2): 4 mg via ORAL
  Filled 2018-12-05 (×6): qty 1

## 2018-12-05 NOTE — Progress Notes (Signed)
Recreation Therapy Notes  Date: 10.29.20 Time: 1000 Location: 500 Hall Dayroom  Group Topic: Leisure Education  Goal Area(s) Addresses:  Patient will identify positive leisure activities.  Patient will identify one positive benefit of participation in leisure activities.   Intervention: Leisure Group Game  Activity: Pictionary.  Patients were split into 2 groups.  One person from the group would pick a word from the container and draw it on the board.  Their team has one minute to guess what the picture is.  If they guess correctly, they get the point.  If they don't guess it, the other team gets a chance to steal the point.  Education:  Leisure Education, Dentist  Education Outcome: Acknowledges education/In group clarification offered/Needs additional education  Clinical Observations/Feedback: Pt did not attend group session.    Victorino Sparrow, LRT/CTRS         [name], Jasiel Belisle A 12/05/2018 11:59 AM

## 2018-12-05 NOTE — Progress Notes (Signed)
[facility] MD Progress Note  12/05/2018 9:05 AM Debbie Dorsey  MRN:  [id number] Subjective:    Patient has no boundaries she continues to follow me through the ward, when I am not speaking with her directly she is pounding on my door she is required IM medications the last couple of mornings as a result.  She repeatedly demands to see her grandfather.  She will not answer questions she becomes mute with certain specific questions.  And we will get a hold of her act team as discussed below. Principal Problem: Exacerbation of psychotic disorder Diagnosis: Active Problems:   Schizophrenia ([place])  Total Time spent with patient: 20 minutes  Past Psychiatric History: Extensive  Past Medical History:  Past Medical History:  Diagnosis Date  . Diabetes mellitus without complication ([place])   . Schizophrenia ([place])    History reviewed. No pertinent surgical history. Family History:  Family History  Problem Relation Age of Onset  . Mental illness Sister    Family Psychiatric  History: No new data Social History:  Social History   Substance and Sexual Activity  Alcohol Use No     Social History   Substance and Sexual Activity  Drug Use No    Social History   Socioeconomic History  . Marital status: Single    Spouse name: Not on file  . Number of children: Not on file  . Years of education: Not on file  . Highest education level: Not on file  Occupational History  . Not on file  Social Needs  . Financial resource strain: Not on file  . Food insecurity    Worry: Not on file    Inability: Not on file  . Transportation needs    Medical: Not on file    Non-medical: Not on file  Tobacco Use  . Smoking status: Never Smoker  . Smokeless tobacco: Never Used  Substance and Sexual Activity  . Alcohol use: No  . Drug use: No  . Sexual activity: Not Currently  Lifestyle  . Physical activity    Days per week: Not on file    Minutes per session: Not on file  . Stress: Not on file   Relationships  . Social Herbalist on phone: Not on file    Gets together: Not on file    Attends religious service: Not on file    Active member of club or organization: Not on file    Attends meetings of clubs or organizations: Not on file    Relationship status: Not on file  Other Topics Concern  . Not on file  Social History Narrative  . Not on file   Additional Social History:                         Sleep: Good  Appetite:  Good  Current Medications: Current Facility-Administered Medications  Medication Dose Route Frequency Provider Last Rate Last Dose  . acetaminophen (TYLENOL) tablet 650 mg  650 mg Oral Q6H PRN Emmaline Kluver, FNP      . albuterol (VENTOLIN HFA) 108 (90 Base) MCG/ACT inhaler 2 puff  2 puff Inhalation Q4H PRN Emmaline Kluver, FNP      . alum & mag hydroxide-simeth (MAALOX/MYLANTA) 200-200-20 MG/5ML suspension 30 mL  30 mL Oral Q4H PRN Emmaline Kluver, FNP      . insulin aspart (novoLOG) injection 0-9 Units  0-9 Units Subcutaneous TID WC Mallie Darting Cordie Grice, MD  2 Units at 12/05/18 0719  . insulin aspart (novoLOG) injection 8 Units  8 Units Subcutaneous TID WC Johnn Hai, MD   8 Units at 12/05/18 0719  . insulin NPH Human (NOVOLIN N) injection 25 Units  25 Units Subcutaneous BID AC & HS Johnn Hai, MD   25 Units at 12/04/18 2058  . LORazepam (ATIVAN) tablet 0.5 mg  0.5 mg Oral TID Johnn Hai, MD   0.5 mg at 12/04/18 1714  . magnesium hydroxide (MILK OF MAGNESIA) suspension 30 mL  30 mL Oral Daily PRN Emmaline Kluver, FNP      . megestrol (MEGACE) tablet 40 mg  40 mg Oral Daily Sharma Covert, MD   40 mg at 12/04/18 1229  . mirtazapine (REMERON SOL-TAB) disintegrating tablet 15 mg  15 mg Oral QHS Johnn Hai, MD   15 mg at 12/04/18 2058  . OLANZapine zydis (ZYPREXA) disintegrating tablet 10 mg  10 mg Oral Q8H PRN Sharma Covert, MD      . perphenazine (TRILAFON) tablet 4 mg  4 mg Oral BID Johnn Hai, MD      . perphenazine (TRILAFON)  tablet 6 mg  6 mg Oral QHS Johnn Hai, MD   6 mg at 12/04/18 2058  . simvastatin (ZOCOR) tablet 40 mg  40 mg Oral q1800 Sharma Covert, MD   40 mg at 12/04/18 1713  . traZODone (DESYREL) tablet 50 mg  50 mg Oral QHS PRN Emmaline Kluver, FNP   50 mg at 12/02/18 2024    Lab Results:  Results for orders placed or performed during the hospital encounter of 11/29/18 (from the past 48 hour(s))  Glucose, capillary     Status: Abnormal   Collection Time: 12/03/18 12:23 PM  Result Value Ref Range   Glucose-Capillary 426 (H) 70 - 99 mg/dL  Glucose, capillary     Status: Abnormal   Collection Time: 12/03/18  5:28 PM  Result Value Ref Range   Glucose-Capillary 377 (H) 70 - 99 mg/dL  Glucose, capillary     Status: Abnormal   Collection Time: 12/03/18  7:31 PM  Result Value Ref Range   Glucose-Capillary 251 (H) 70 - 99 mg/dL  Glucose, capillary     Status: Abnormal   Collection Time: 12/04/18  5:39 AM  Result Value Ref Range   Glucose-Capillary 128 (H) 70 - 99 mg/dL  Glucose, capillary     Status: Abnormal   Collection Time: 12/04/18 11:58 AM  Result Value Ref Range   Glucose-Capillary 253 (H) 70 - 99 mg/dL  Glucose, capillary     Status: Abnormal   Collection Time: 12/04/18  5:08 PM  Result Value Ref Range   Glucose-Capillary 440 (H) 70 - 99 mg/dL  Glucose, capillary     Status: Abnormal   Collection Time: 12/04/18  8:37 PM  Result Value Ref Range   Glucose-Capillary 311 (H) 70 - 99 mg/dL  Glucose, capillary     Status: Abnormal   Collection Time: 12/05/18  6:14 AM  Result Value Ref Range   Glucose-Capillary 155 (H) 70 - 99 mg/dL  Hemoglobin A1c     Status: Abnormal   Collection Time: 12/05/18  6:41 AM  Result Value Ref Range   Hgb A1c MFr Bld 9.8 (H) 4.8 - 5.6 %    Comment: (NOTE) Pre diabetes:          5.7%-6.4% Diabetes:              >6.4% Glycemic control for   <  7.0% adults with diabetes    Mean Plasma Glucose 234.56 mg/dL    Comment: Performed at [facility] [address], [place], [place] [address]    Blood Alcohol level:  Lab Results  Component Value Date   [facility] <10 11/28/2018   ETH <10 [id number]    Metabolic Disorder Labs: Lab Results  Component Value Date   HGBA1C 9.8 (H) 12/05/2018   MPG 234.56 12/05/2018   MPG 197 07/29/2018   No results found for: PROLACTIN No results found for: CHOL, TRIG, HDL, CHOLHDL, VLDL, LDLCALC  Physical Findings: AIMS: Facial and Oral Movements Muscles of Facial Expression: None, normal Lips and Perioral Area: None, normal Jaw: None, normal Tongue: None, normal,Extremity Movements Upper (arms, wrists, hands, fingers): None, normal Lower (legs, knees, ankles, toes): None, normal, Trunk Movements Neck, shoulders, hips: None, normal, Overall Severity Severity of abnormal movements (highest score from questions above): None, normal Incapacitation due to abnormal movements: None, normal Patient's awareness of abnormal movements (rate only patient's report): No Awareness, Dental Status Current problems with teeth and/or dentures?: No Does patient usually wear dentures?: No  CIWA:  CIWA-Ar Total: 1 COWS:  COWS Total Score: 2  Musculoskeletal: Strength & Muscle Tone: within normal limits Gait & Station: normal Patient leans: N/A  Psychiatric Specialty Exam: Physical Exam  ROS  Blood pressure 132/64, pulse (!) 135, temperature 98.4 F (36.9 C), temperature source Oral, resp. rate 18, height 4' 10.5" (1.486 m), weight 43.5 kg, SpO2 99 %, unknown if currently breastfeeding.Body mass index is 19.72 kg/m.  General Appearance: Disheveled  Eye Contact:  Fair  Speech:  Pressured  Volume:  Increased  Mood:  Anxious and manic  Affect:  Labile  Thought Process:  Disorganized and Descriptions of Associations: Circumstantial  Orientation:  Other:  no answers  Thought Content:  Illogical  Suicidal Thoughts:  No  Homicidal Thoughts:  No  Memory:  Immediate;   Poor  Judgement:  Impaired  Insight:  Lacking   Psychomotor Activity:  Increased  Concentration:  Concentration: Poor and Attention Span: Poor  Recall:  Poor  Fund of Knowledge:  Poor  Language:  Poor  Akathisia:  Negative  Handed:  Right  AIMS (if indicated):     Assets:  Communication Skills Desire for Improvement  ADL's:  Intact  Cognition:  WNL  Sleep:  Number of Hours: 7.75     Treatment Plan Summary: Daily contact with patient to assess and evaluate symptoms and progress in treatment and Medication management  For continued psychosis continue current precautions but also escalate dose of perphenazine apparently she has been noncompliant with her clozapine but I am going to get a hold of the act team to make sure along the same page with her medication  With regards to social concerns, staff was worried that the patient and her grandfather interacted more like a couple than grandchild grandparent relationship and Adult Protective Services is investigating.  Is been determined we will go ahead and let the grandfather visit so that this may provide further data for the investigation based on their interactions here Again discuss long-acting injectable with patient when she is more able to understand risk-benefit side effects but also discussed with act team  Johnn Hai, MD 12/05/2018, 9:05 AM

## 2018-12-05 NOTE — Progress Notes (Signed)
Patient ID: Debbie Dorsey, female   DOB: 1970/08/20, 48 y.o.   MRN: [id number]   CSW left a detailed voicemail for DSS case worker, Currie Paris [phone]) regarding the patient's case and informing that patient will be most likely discharged tomorrow.

## 2018-12-05 NOTE — Progress Notes (Signed)
Recreation Therapy Notes  INPATIENT RECREATION THERAPY ASSESSMENT  Patient Details Name: Debbie Dorsey MRN: [id number] DOB: 1970/11/17 Today's Date: 12/05/2018       Information Obtained From: Patient  Able to Participate in Assessment/Interview: Yes  Patient Presentation: Alert, Responsive  Reason for Admission (Per Patient): Patient Unable to Identify  Patient Stressors: (None identified)  Coping Skills:   TV, Music, Deep Breathing, Talk  Leisure Interests (2+):  Individual - Other (Comment)(Sleep)  Frequency of Recreation/Participation: (Not specified by patient)  Awareness of Community Resources:  ("I don't know")  [place] of Residence:  [place]  Patient Main Form of Transportation: Other (Comment)("my grandfather")  Patient Strengths:  "I'm nice and good"  Patient Identified Areas of Improvement:  "Bathing"  Patient Goal for Hospitalization:  "to be alright"  Current SI (including self-harm):  No  Current HI:  No  Current AVH: No  Staff Intervention Plan: Collaborate with Interdisciplinary Treatment Team, Group Attendance  Consent to Intern Participation: N/A    Victorino Sparrow, LRT/CTRS  Victorino Sparrow A 12/05/2018, 12:03 PM

## 2018-12-05 NOTE — Progress Notes (Signed)
Patient has been anxious and irritable.  Patient has been following the physician down the hall and banging on the door of the.  Patient was unable to be redirected and required IM geodon.  Patient was able to calm and rested in her room.   Assess patient for safety. Offer medications as prescribed, encourage patient to increase fluid intake, toilet patient Q h.   Patient able to maintain safety.  Continue to [place] as planned.

## 2018-12-05 NOTE — BHH Group Notes (Signed)
[place] LCSW Group Therapy Note   Date and Time: 12/05/2018 @ 1:30pm  Type of Group and Topic: Psychoeducational Group: Discharge Planning and Individual Wellness  Participation Level: BHH PARTICIPATION LEVEL: Did Not Attend    Description of Group: Discharge planning group reviews patient's anticipated discharge plans and assists patients to anticipate and address any barriers to wellness/recovery in the community. Suicide prevention education is reviewed with patients in group. Therapeutic Goals 1. Patients will state their anticipated discharge plan and mental health aftercare 2. Patients will identify potential barriers to wellness in the community setting 3. Patients will engage in problem solving, solution focused discussion of ways to anticipate and address barriers to wellness/recovery     Summary of Patient Progress:   Did not attend        Therapeutic Modalities: Motivational Interviewing and Psycho-Education

## 2018-12-05 NOTE — Progress Notes (Signed)
D: Pt continues to need redirection, but pt has been cooperative on the unit. Pt presents as confused at times, but other times she appears lucid.   A: Pt was offered support and encouragement. Pt was given scheduled medications. Pt was encourage to attend groups. Q 15 minute checks were done for safety.   R: safety maintained on unit.

## 2018-12-06 DIAGNOSIS — F200 Paranoid schizophrenia: Secondary | ICD-10-CM | POA: Diagnosis not present

## 2018-12-06 LAB — GLUCOSE, CAPILLARY
Glucose-Capillary: 79 mg/dL (ref 70–99)
Glucose-Capillary: 194 mg/dL — ABNORMAL HIGH (ref 70–99)

## 2018-12-06 MED ORDER — PERPHENAZINE 4 MG PO TABS: ORAL_TABLET | ORAL | 3 refills | Status: AC

## 2018-12-06 MED ORDER — LORAZEPAM 0.5 MG PO TABS: 0.5000 mg | ORAL_TABLET | Freq: Three times a day (TID) | ORAL | 0 refills | Status: AC

## 2018-12-06 MED ORDER — MIRTAZAPINE 15 MG PO TBDP: 15.0000 mg | ORAL_TABLET | Freq: Every day | ORAL | 2 refills | Status: DC

## 2018-12-06 NOTE — Discharge Summary (Signed)
Physician Discharge Summary Note  Patient:  Debbie Dorsey is an 48 y.o., female  MRN:  [id number]  DOB:  [birth year]  Patient phone:  [phone] (home)   Patient address:   [place] [address],   Total Time spent with patient: Greater than 30 minutes  Date of Admission:  11/29/2018  Date of Discharge: 12-06-18  Reason for Admission: Acting bizarre & mute.  Principal Problem: Schizophrenia [facility])  Discharge Diagnoses: Principal Problem:   Schizophrenia [facility])  Past Psychiatric History: Schizophrenia  Past Medical History:  Past Medical History:  Diagnosis Date  . Diabetes mellitus without complication ([place])   . Schizophrenia ([place])    History reviewed. No pertinent surgical history.  Family History:  Family History  Problem Relation Age of Onset  . Mental illness Sister    Family Psychiatric  History: See H&P  Social History:  Social History   Substance and Sexual Activity  Alcohol Use No     Social History   Substance and Sexual Activity  Drug Use No    Social History   Socioeconomic History  . Marital status: Single    Spouse name: Not on file  . Number of children: Not on file  . Years of education: Not on file  . Highest education level: Not on file  Occupational History  . Not on file  Social Needs  . Financial resource strain: Not on file  . Food insecurity    Worry: Not on file    Inability: Not on file  . Transportation needs    Medical: Not on file    Non-medical: Not on file  Tobacco Use  . Smoking status: Never Smoker  . Smokeless tobacco: Never Used  Substance and Sexual Activity  . Alcohol use: No  . Drug use: No  . Sexual activity: Not Currently  Lifestyle  . Physical activity    Days per week: Not on file    Minutes per session: Not on file  . Stress: Not on file  Relationships  . Social Herbalist on phone: Not on file    Gets together: Not on file    Attends religious service: Not on  file    Active member of club or organization: Not on file    Attends meetings of clubs or organizations: Not on file    Relationship status: Not on file  Other Topics Concern  . Not on file  Social History Narrative  . Not on file   Hospital Course: (Per Md's admission evaluation): Patient is a 48 year old female with a past psychiatric history significant for schizophrenia who presented to the [facility] emergency department on 11/29/2018. The patient is intermittently essentially mute. According to the admission note she had been staying with her grandfather, the [facility] police have been contacted by her grandfather who reported that the patient had been acting bizarre. Per the notes from the [facility] police one of the neighbors stated that the patient and her grandfather were having sex and were in a relationship and that they were not trying to hide it. While EMS reported there, she threw a cereal box at her grandfather, then police dispatch there to transport the patient to the ED. Patient is familiar to me from her early admission this year. She was transferred from the [place] secondary to poorly controlled diabetes at that time to treat her schizophrenia. She was admitted on 07/31/2018. She remained in the hospital for  essentially a month. Her discharge medications at that time included Clozaril, gabapentin, insulin NPH, perphenazine and simvastatin. The patient is essentially mute today, and the only thing that she repeats is "where is my grandfather". The patient has had multiple psychiatric hospitalizations for her schizophrenia. Her last hospitalization before the June hospitalization was in March 2020. She was admitted to the hospital for evaluation and stabilization.  Debbie Dorsey was admitted to the [facility] for worsening symptoms of Schizophrenia manifested as bizarre behavior & mutism. After the above admission assessment, her presenting  symptoms were identified. The medication regimen targeting those symptoms were initiated. She was medicated, stabilized on the medications as listed below on her discharge medication lists. She presented other significant pre-existing medical problems that required treatment or monitoring. She was resumed/discharged on her pertinent home medications for those health issues. She was also enrolled & participated in the group counseling sessions being offered & held on this unit to learn coping skills. She tolerated her treatment regimen without any adverse effects reported.  During the course of her hospitalization & as her treatment progressed, improvement was noted by observation on daily basis of [name]'s reports of symptom reduction. Her emotional & mental status were also monitored by daily self-inventory reports completed by her & the clinical staff. She was evaluated daily by the treatment team for mood stability & plans for continued recovery after discharge. She was recommended for further treatment options upon discharge by referring & scheduling her an outpatient psychiatric clinic for follow-up visits & medication managment as listed below.     Upon discharge, Debbie Dorsey was both mentally and medically stable. She denies any SIHI, auditory/visual/tactile hallucinations, delusional thoughts & or paranoia. She was able to engage in safety planning including plan to return to [facility] or contact emergency services if she feels unable to maintain her own safety or the safety of others. Pt had no further questions, comments, or concerns. She left [facility] with all personal belongings in no apparent distress. Transportation per her grandfather.   Physical Findings: AIMS: Facial and Oral Movements Muscles of Facial Expression: None, normal Lips and Perioral Area: None, normal Jaw: None, normal Tongue: None, normal,Extremity Movements Upper (arms, wrists, hands, fingers): None, normal Lower (legs, knees, ankles,  toes): None, normal, Trunk Movements Neck, shoulders, hips: None, normal, Overall Severity Severity of abnormal movements (highest score from questions above): None, normal Incapacitation due to abnormal movements: None, normal Patient's awareness of abnormal movements (rate only patient's report): No Awareness, Dental Status Current problems with teeth and/or dentures?: No Does patient usually wear dentures?: No  CIWA:  CIWA-Ar Total: 1 COWS:  COWS Total Score: 2  Musculoskeletal: Strength & Muscle Tone: within normal limits Gait & Station: normal Patient leans: N/A  Psychiatric Specialty Exam: Physical Exam  Nursing note and vitals reviewed. Constitutional: She is oriented to person, place, and time. She appears well-developed.  Neck: Normal range of motion.  Cardiovascular:  Elevated pulse rate: 139  Respiratory: No respiratory distress. She has no wheezes.  Genitourinary:    Genitourinary Comments: Deferred   Musculoskeletal: Normal range of motion.  Neurological: She is alert and oriented to person, place, and time.  Skin: Skin is warm and dry.    Review of Systems  Constitutional: Negative for chills and fever.  Respiratory: Negative for cough, shortness of breath and wheezing.   Cardiovascular: Negative for chest pain and palpitations.  Gastrointestinal: Negative for abdominal pain, heartburn, nausea and vomiting.  Neurological: Negative for dizziness and headaches.  Psychiatric/Behavioral: Positive for  depression (Stabilized with medication prior to discharge) and hallucinations (Hx. Psychosis (Stabilized with medication prior to discharge)). Negative for memory loss, substance abuse and suicidal ideas. The patient has insomnia (Stabilized with medication prior to discharge). The patient is not nervous/anxious (Stable).     Blood pressure 106/75, pulse (!) 121, temperature 98.4 F (36.9 C), temperature source Oral, resp. rate 18, height 4' 10.5" (1.486 m), weight 43.5  kg, SpO2 99 %, unknown if currently breastfeeding.Body mass index is 19.72 kg/m.  See Md's discharge SRA   Has this patient used any form of tobacco in the last 30 days? (Cigarettes, Smokeless Tobacco, Cigars, and/or Pipes): N/A  Blood Alcohol level:  Lab Results  Component Value Date   ETH <10 11/28/2018   ETH <10 [id number]   Metabolic Disorder Labs:  Lab Results  Component Value Date   HGBA1C 9.8 (H) 12/05/2018   MPG 234.56 12/05/2018   MPG 197 07/29/2018   No results found for: PROLACTIN No results found for: CHOL, TRIG, HDL, CHOLHDL, VLDL, LDLCALC  See Psychiatric Specialty Exam and Suicide Risk Assessment completed by Attending Physician prior to discharge.  Discharge destination:  Home  Is patient on multiple antipsychotic therapies at discharge:  No   Has Patient had three or more failed trials of antipsychotic monotherapy by history:  No  Recommended Plan for Multiple Antipsychotic Therapies: NA  Allergies as of 12/06/2018   No Known Allergies     Medication List    STOP taking these medications   cloZAPine 25 MG tablet Commonly known as: CLOZARIL   clozapine 50 MG tablet Commonly known as: CLOZARIL     TAKE these medications     Indication  acetaminophen 325 MG tablet Commonly known as: TYLENOL Take 650 mg by mouth every 6 (six) hours as needed for mild pain or headache.  Indication: Fever, Pain   albuterol 108 (90 Base) MCG/ACT inhaler Commonly known as: VENTOLIN HFA Inhale 2 puffs into the lungs every 4 (four) hours as needed for shortness of breath or wheezing.  Indication: Spasm of Lung Air Passages, Asthma   gabapentin 300 MG capsule Commonly known as: NEURONTIN Take 1 capsule (300 mg total) by mouth 3 (three) times daily.  Indication: Fibromyalgia Syndrome   insulin NPH-regular Human (70-30) 100 UNIT/ML injection Inject 32 Units into the skin 2 (two) times daily with a meal.  Indication: Insulin-Dependent Diabetes   LORazepam 0.5 MG  tablet Commonly known as: ATIVAN Take 1 tablet (0.5 mg total) by mouth 3 (three) times daily for 10 days.  Indication: Feeling Anxious   mirtazapine 15 MG disintegrating tablet Commonly known as: REMERON SOL-TAB Take 1 tablet (15 mg total) by mouth at bedtime.  Indication: Major Depressive Disorder   multivitamin with minerals Tabs tablet Take 1 tablet by mouth daily.  Indication: Deficiency of Folic Acid   pantoprazole 40 MG tablet Commonly known as: PROTONIX Take 40 mg by mouth daily.  Indication: Indigestion   perphenazine 4 MG tablet Commonly known as: TRILAFON 1 a day 3 at hs What changed:   how much to take  how to take this  when to take this  additional instructions  Indication: Schizophrenia   Rivaroxaban 15 & 20 MG Tbpk Take as directed on package: Start with one 15mg  tablet by mouth twice a day with food. On Day 22, switch to one 20mg  tablet once a day with food.  Indication: Blood Clot in a Deep Vein   simvastatin 40 MG tablet Commonly known as: ZOCOR  Take 40 mg by mouth daily at 12 noon.  Indication: High Amount of Triglycerides in the Blood   traZODone 50 MG tablet Commonly known as: DESYREL Take 1 tablet (50 mg total) by mouth at bedtime as needed for sleep.  Indication: Drug-Induced Difficulty in Voluntary Movement      Follow-up Information    [facility] Follow up.   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  Your ACTT services continue after you discharge. They will contact you before your follow up.  Contact information: [address] [address] [phone]          Follow-up recommendations: Activity:  As tolerated Diet: As recommended by your primary care doctor. Keep all scheduled follow-up appointments as recommended.  Comments: Prescriptions given at discharge.  Patient agreeable to plan.  Given opportunity to ask questions.  Appears to feel comfortable with discharge denies any  current suicidal or homicidal thought. Patient is also instructed prior to discharge to: Take all medications as prescribed by his/her mental healthcare provider. Report any adverse effects and or reactions from the medicines to his/her outpatient provider promptly. Patient has been instructed & cautioned: To not engage in alcohol and or illegal drug use while on prescription medicines. In the event of worsening symptoms, patient is instructed to call the crisis hotline, 911 and or go to the nearest ED for appropriate evaluation and treatment of symptoms. To follow-up with his/her primary care provider for your other medical issues, concerns and or health care needs.  Signed: Lindell Spar, NP, PMHNP, FNP-BC 12/06/2018, 9:53 AM

## 2018-12-06 NOTE — Progress Notes (Signed)
Recreation Therapy Notes  Date: 10.30.20 Time: 1000 Location:  500 Hall Dayroom  Group Topic: Communication, Team Building, Problem Solving  Goal Area(s) Addresses:  Patient will effectively work with peer towards shared goal.  Patient will identify skills used to make activity successful.  Patient will identify how skills used during activity can be used to reach post d/c goals.   Behavioral Response: None  Intervention: STEM Activity  Activity: Straw Bridge.  Patients were divided into groups.  Patients were given 15 straws and a long piece of masking tape.  Patients were to build an elevated bridge that could hold a small puzzle box.  Education: Education officer, community, Discharge Planning   Education Outcome: Acknowledges education/In group clarification offered/Needs additional education.   Clinical Observations/Feedback:  Pt did not participate.  Pt sat and watched.    Victorino Sparrow, LRT/CTRS     Victorino Sparrow A 12/06/2018 11:49 AM

## 2018-12-06 NOTE — Progress Notes (Signed)
  [facility] Adult Case Management Discharge Plan :  Will you be returning to the same living situation after discharge:  Yes,  home  At discharge, do you have transportation home?: Yes,  pt's grandfather  Do you have the ability to pay for your medications: Yes,  united health medicare   Release of information consent forms completed and in the chart;  Patient's signature needed at discharge.  Patient to Follow up at: Follow-up Information    [facility] Follow up.   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  Your ACTT services continue after you discharge. They will contact you before your follow up.  Contact information: [address] [place]  [address] [phone]           Next level of care provider has access to [place] and Suicide Prevention discussed: Yes,  with pt's grandfather      Has patient been referred to the Quitline?: N/A patient is not a smoker  Patient has been referred for addiction treatment: Yes  Billey Chang, Student-Social Work 12/06/2018, 9:50 AM

## 2018-12-06 NOTE — Plan of Care (Signed)
Patient out of bed and visible in the milieu. Cooperative and expressing readiness for discharge. To be discharged today per MD order.

## 2018-12-06 NOTE — Progress Notes (Signed)
Recreation Therapy Notes  INPATIENT RECREATION TR PLAN  Patient Details Name: Debbie Dorsey MRN: [id number] DOB: [birth year] Today's Date: 12/06/2018  Rec Therapy Plan Is patient appropriate for Therapeutic Recreation?: Yes Treatment times per week: about 3 days Estimated Length of Stay: 5-7 days TR Treatment/Interventions: Group participation (Comment)  Discharge Criteria Pt will be discharged from therapy if:: Discharged Treatment plan/goals/alternatives discussed and agreed upon by:: Patient/family  Discharge Summary Short term goals set: See patient care plam Short term goals met: Not met Progress toward goals comments: Groups attended Which groups?: Communication Reason goals not met: Pt attended one group session. Therapeutic equipment acquired: N/A Reason patient discharged from therapy: Discharge from hospital Pt/family agrees with progress & goals achieved: Yes Date patient discharged from therapy: 12/06/18    Victorino Sparrow, LRT/CTRS  [name], Trenell Moxey A 12/06/2018, 12:22 PM

## 2018-12-06 NOTE — Progress Notes (Signed)
Patient just urinated in the toilet without staff assistance.

## 2018-12-06 NOTE — BHH Counselor (Signed)
Adult Comprehensive Assessment  Patient ID: Debbie Dorsey, female   DOB: 12/11/1970, 48 y.o.   MRN: [id number]  CSW attempted 4 times during patient's stay but unable due to patient not be cognitive enough and patient's level of psychosis.    Summary/Recommendations:   Summary and Recommendations (to be completed by the evaluator): Patient is a 48 year old female who had been staying with her grandfather, the [facility] police have been contacted by her grandfather who reported that the patient had been acting bizarre. Pt's diagnosis is: Schizophrenia ([place]). Recommendations for pt include: crisis stabilization, therapeutic milieu, medication management, and attend and participate in group therapy.  Trecia Rogers. 12/06/2018

## 2018-12-06 NOTE — Progress Notes (Signed)
Patient expressing readiness for discharge and motivated to take her medications as prescribed.

## 2018-12-06 NOTE — Plan of Care (Signed)
Pt attended one recreation therapy group session but did not participate.   Victorino Sparrow, LRT/CTRS

## 2018-12-06 NOTE — Progress Notes (Signed)
Patient discharged per MD order. Discharge instructions given to patient and grandfather. They both verbalized understanding. Belongings returned to patient. No sign of distress upon discharge.

## 2018-12-06 NOTE — Tx Team (Signed)
Interdisciplinary Treatment and Diagnostic Plan Update  12/06/2018 Time of Session: 10:00am Debbie Dorsey MRN: [id number]  Principal Diagnosis: Schizophrenia [facility])  Secondary Diagnoses: Principal Problem:   Schizophrenia ([place])   Current Medications:  Current Facility-Administered Medications  Medication Dose Route Frequency Provider Last Rate Last Dose  . acetaminophen (TYLENOL) tablet 650 mg  650 mg Oral Q6H PRN Emmaline Kluver, FNP      . albuterol (VENTOLIN HFA) 108 (90 Base) MCG/ACT inhaler 2 puff  2 puff Inhalation Q4H PRN Emmaline Kluver, FNP      . alum & mag hydroxide-simeth (MAALOX/MYLANTA) 200-200-20 MG/5ML suspension 30 mL  30 mL Oral Q4H PRN Emmaline Kluver, FNP      . insulin aspart (novoLOG) injection 0-9 Units  0-9 Units Subcutaneous TID WC Sharma Covert, MD   9 Units at 12/05/18 1741  . insulin aspart (novoLOG) injection 8 Units  8 Units Subcutaneous TID WC Johnn Hai, MD   8 Units at 12/06/18 0801  . insulin NPH Human (NOVOLIN N) injection 25 Units  25 Units Subcutaneous BID AC & HS Johnn Hai, MD   25 Units at 12/06/18 0813  . LORazepam (ATIVAN) tablet 0.5 mg  0.5 mg Oral TID Johnn Hai, MD   0.5 mg at 12/06/18 0805  . magnesium hydroxide (MILK OF MAGNESIA) suspension 30 mL  30 mL Oral Daily PRN Emmaline Kluver, FNP      . megestrol (MEGACE) tablet 40 mg  40 mg Oral Daily Sharma Covert, MD   40 mg at 12/06/18 0805  . mirtazapine (REMERON SOL-TAB) disintegrating tablet 15 mg  15 mg Oral QHS Johnn Hai, MD   15 mg at 12/05/18 2056  . OLANZapine zydis (ZYPREXA) disintegrating tablet 10 mg  10 mg Oral Q8H PRN Sharma Covert, MD      . perphenazine (TRILAFON) tablet 4 mg  4 mg Oral BID Johnn Hai, MD   4 mg at 12/06/18 0805  . perphenazine (TRILAFON) tablet 6 mg  6 mg Oral QHS Johnn Hai, MD   6 mg at 12/05/18 2056  . simvastatin (ZOCOR) tablet 40 mg  40 mg Oral q1800 Sharma Covert, MD   40 mg at 12/05/18 1744  . traZODone (DESYREL) tablet 50 mg  50 mg  Oral QHS PRN Emmaline Kluver, FNP   50 mg at 12/02/18 2024   Current Outpatient Medications  Medication Sig Dispense Refill  . acetaminophen (TYLENOL) 325 MG tablet Take 650 mg by mouth every 6 (six) hours as needed for mild pain or headache.    . albuterol (VENTOLIN HFA) 108 (90 Base) MCG/ACT inhaler Inhale 2 puffs into the lungs every 4 (four) hours as needed for shortness of breath or wheezing.    . gabapentin (NEURONTIN) 300 MG capsule Take 1 capsule (300 mg total) by mouth 3 (three) times daily. 90 capsule 1  . insulin NPH-regular Human (70-30) 100 UNIT/ML injection Inject 32 Units into the skin 2 (two) times daily with a meal.    . LORazepam (ATIVAN) 0.5 MG tablet Take 1 tablet (0.5 mg total) by mouth 3 (three) times daily for 10 days. 30 tablet 0  . mirtazapine (REMERON SOL-TAB) 15 MG disintegrating tablet Take 1 tablet (15 mg total) by mouth at bedtime. 90 tablet 2  . Multiple Vitamin (MULTIVITAMIN WITH MINERALS) TABS tablet Take 1 tablet by mouth daily.    . pantoprazole (PROTONIX) 40 MG tablet Take 40 mg by mouth daily.    Marland Kitchen perphenazine (  TRILAFON) 4 MG tablet 1 a day 3 at hs 120 tablet 3  . Rivaroxaban 15 & 20 MG TBPK Take as directed on package: Start with one 15mg  tablet by mouth twice a day with food. On Day 22, switch to one 20mg  tablet once a day with food. (Patient not taking: Reported on 07/29/2018) 51 each 0  . simvastatin (ZOCOR) 40 MG tablet Take 40 mg by mouth daily at 12 noon.     . traZODone (DESYREL) 50 MG tablet Take 1 tablet (50 mg total) by mouth at bedtime as needed for sleep. 90 tablet 1   PTA Medications: No medications prior to admission.    Patient Stressors: Marital or family conflict Medication change or noncompliance  Patient Strengths: Technical sales engineer for treatment/growth  Treatment Modalities: Medication Management, Group therapy, Case management,  1 to 1 session with clinician, Psychoeducation, Recreational therapy.   Physician  Treatment Plan for Primary Diagnosis: Schizophrenia ([place]) Long Term Goal(s): Improvement in symptoms so as ready for discharge Improvement in symptoms so as ready for discharge   Short Term Goals: Ability to identify changes in lifestyle to reduce recurrence of condition will improve Ability to verbalize feelings will improve Ability to disclose and discuss suicidal ideas Ability to demonstrate self-control will improve Ability to identify and develop effective coping behaviors will improve Ability to maintain clinical measurements within normal limits will improve Compliance with prescribed medications will improve Ability to identify changes in lifestyle to reduce recurrence of condition will improve Ability to verbalize feelings will improve Ability to disclose and discuss suicidal ideas Ability to demonstrate self-control will improve Ability to identify and develop effective coping behaviors will improve Ability to maintain clinical measurements within normal limits will improve Compliance with prescribed medications will improve  Medication Management: Evaluate patient's response, side effects, and tolerance of medication regimen.  Therapeutic Interventions: 1 to 1 sessions, Unit Group sessions and Medication administration.  Evaluation of Outcomes: Adequate for Discharge  Physician Treatment Plan for Secondary Diagnosis: Principal Problem:   Schizophrenia ([place])  Long Term Goal(s): Improvement in symptoms so as ready for discharge Improvement in symptoms so as ready for discharge   Short Term Goals: Ability to identify changes in lifestyle to reduce recurrence of condition will improve Ability to verbalize feelings will improve Ability to disclose and discuss suicidal ideas Ability to demonstrate self-control will improve Ability to identify and develop effective coping behaviors will improve Ability to maintain clinical measurements within normal limits will  improve Compliance with prescribed medications will improve Ability to identify changes in lifestyle to reduce recurrence of condition will improve Ability to verbalize feelings will improve Ability to disclose and discuss suicidal ideas Ability to demonstrate self-control will improve Ability to identify and develop effective coping behaviors will improve Ability to maintain clinical measurements within normal limits will improve Compliance with prescribed medications will improve     Medication Management: Evaluate patient's response, side effects, and tolerance of medication regimen.  Therapeutic Interventions: 1 to 1 sessions, Unit Group sessions and Medication administration.  Evaluation of Outcomes: Adequate for Discharge   RN Treatment Plan for Primary Diagnosis: Schizophrenia ([place]) Long Term Goal(s): Knowledge of disease and therapeutic regimen to maintain health will improve  Short Term Goals: Ability to participate in decision making will improve, Ability to verbalize feelings will improve, Ability to disclose and discuss suicidal ideas, Ability to identify and develop effective coping behaviors will improve and Compliance with prescribed medications will improve  Medication Management: RN will  administer medications as ordered by provider, will assess and evaluate patient's response and provide education to patient for prescribed medication. RN will report any adverse and/or side effects to prescribing provider.  Therapeutic Interventions: 1 on 1 counseling sessions, Psychoeducation, Medication administration, Evaluate responses to treatment, Monitor vital signs and CBGs as ordered, Perform/monitor CIWA, COWS, AIMS and Fall Risk screenings as ordered, Perform wound care treatments as ordered.  Evaluation of Outcomes: Adequate for Discharge   LCSW Treatment Plan for Primary Diagnosis: Schizophrenia [facility]) Long Term Goal(s): Safe transition to appropriate next level of care at  discharge, Engage patient in therapeutic group addressing interpersonal concerns.  Short Term Goals: Engage patient in aftercare planning with referrals and resources and Increase skills for wellness and recovery  Therapeutic Interventions: Assess for all discharge needs, 1 to 1 time with Social worker, Explore available resources and support systems, Assess for adequacy in community support network, Educate family and significant other(s) on suicide prevention, Complete Psychosocial Assessment, Interpersonal group therapy.  Evaluation of Outcomes: Adequate for Discharge   Progress in Treatment: Attending groups: No. Participating in groups: No. Taking medication as prescribed: Yes. Toleration medication: Yes. Family/Significant other contact made: Yes, individual(s) contacted:  pt's grandfather Patient understands diagnosis: No. Discussing patient identified problems/goals with staff: Yes. Medical problems stabilized or resolved: Yes. Denies suicidal/homicidal ideation: Yes. Issues/concerns per patient self-inventory: No. Other:   New problem(s) identified: No, Describe:  None  New Short Term/Long Term Goal(s): Medication stabilization, elimination of SI thoughts, and development of a comprehensive mental wellness plan.   Patient Goals:  "be better"  Discharge Plan or Barriers: Patient is discharging home with her grandfather and continuing services with [facility]  Reason for Continuation of Hospitalization: Patient is discharging today.   Estimated Length of Stay: Patient is discharging today.   Attendees: Patient: 12/06/2018   Physician: Dr. Johnn Hai, MD 12/06/2018   Nursing: Louanne Skye, RN 12/06/2018   RN Care Manager: 12/06/2018   Social Worker: Ardelle Anton, LCSW 12/06/2018   Recreational Therapist:  12/06/2018   Other: Ovidio Kin, MSW Intern 12/06/2018   Other:  12/06/2018  Other: 12/06/2018      Scribe for Treatment Team: Trecia Rogers,  LCSW 12/06/2018 3:55 PM

## 2018-12-06 NOTE — Progress Notes (Signed)
Inpatient Diabetes Program Recommendations  AACE/ADA: New Consensus Statement on Inpatient Glycemic Control (2015)  Target Ranges:  Prepandial:   less than 140 mg/dL      Peak postprandial:   less than 180 mg/dL (1-2 hours)      Critically ill patients:  140 - 180 mg/dL   Lab Results  Component Value Date   GLUCAP 194 (H) 12/06/2018   HGBA1C 9.8 (H) 12/05/2018    Review of Glycemic Control  FBS on the low side with 79 mg/dL this am Post-prandials elevated. Needs more meal coverage for CHO intake.  Inpatient Diabetes Program Recommendations:     Decrease NPH to 23 units bid Increase Novolog to 12 units tidwc  Will continue to follow.  Thank you. Lorenda Peck, RD, LDN, CDE Inpatient Diabetes Coordinator [phone]

## 2018-12-06 NOTE — [facility] Suicide Risk Assessment (Signed)
[facility] Discharge Suicide Risk Assessment   Principal Problem: Exacerbation of psychotic disorder Discharge Diagnoses: Active Problems:   Schizophrenia ([place])   Total Time spent with patient: 45 minutes  Musculoskeletal: Strength & Muscle Tone: within normal limits Gait & Station: normal Patient leans: N/A  Psychiatric Specialty Exam: ROS  Blood pressure (!) 88/67, pulse (!) 139, temperature 98.4 F (36.9 C), temperature source Oral, resp. rate 18, height 4' 10.5" (1.486 m), weight 43.5 kg, SpO2 99 %, unknown if currently breastfeeding.Body mass index is 19.72 kg/m.  General Appearance: Casual  Eye Contact::  Good  Speech:  Clear and Coherent409  Volume:  Increased  Mood:  Anxious  Affect:  Restricted  Thought Process:  Goal Directed and Descriptions of Associations: Circumstantial  Orientation:  Other:  Person place situation and day denies auditory or visual hallucinations  Thought Content:  Obsessions and Singularly focused on discharge but denies auditory or visual hallucinations or thoughts of self-harm no paranoid discerned  Suicidal Thoughts:  No  Homicidal Thoughts:  No  Memory:  Immediate;   Fair  Judgement:  Fair  Insight:  Shallow/staff finds that patient urinating on herself/in the day room so forth is behavioral not propelled by any psychosis  Psychomotor Activity:  EPS  Concentration:  Fair  Recall:  [employer] of Knowledge:Fair  Language: Fair  Akathisia:  Negative  Handed:  Right  AIMS (if indicated):     Assets:  Communication Skills Leisure Time Physical Health  Sleep:  Number of Hours: 8  Cognition: WNL  ADL's:  Intact   Mental Status Per Nursing Assessment::   On Admission:  Thoughts of violence towards others  Demographic Factors:  Unemployed  Loss Factors: Decrease in vocational status  Historical Factors: NA  Risk Reduction Factors:   Sense of responsibility to family  Continued Clinical Symptoms:  Previous Psychiatric Diagnoses and  Treatments  Cognitive Features That Contribute To Risk:  Loss of executive function    Suicide Risk:  Minimal: No identifiable suicidal ideation.  Patients presenting with no risk factors but with morbid ruminations; may be classified as minimal risk based on the severity of the depressive symptoms  Follow-up Information    [facility], [facility] Follow up.   Contact information: [address] [place] [address] [phone]           Plan Of Care/Follow-up recommendations:  Activity:  full  Leota Maka, MD 12/06/2018, 8:26 AM

## 2019-02-24 ENCOUNTER — Telehealth: Payer: Self-pay | Admitting: Hematology and Oncology

## 2019-02-24 NOTE — Telephone Encounter (Signed)
Received a new hem referral from Dr. Dagmar Hait for PE, low hgb and elevated ferritin. Debbie Dorsey has been cld and scheduled to see Dr. Lorenso Courier on 1?22 at 2pm. Aware to arrive 15 minutes early.

## 2019-02-27 NOTE — Progress Notes (Signed)
[place] [phone]) [phone]   Fax:([phone] [place] NOTE  Patient Care Team: Prince Solian, MD as PCP - General (Internal Medicine)  Hematological/Oncological History # Pulmonary Embolism 1)04/06/2018: brought to the ED with persistent tachycardia after being admitted to [facility]. CT PE study showed small bilateral pulmonary emboli involving the right lower lobar pulmonary artery, right lower lobe segmental pulmonary artery, right upper lobe segmental pulmonary artery, and left upper lobe segmental pulmonary artery. 2) 05/25/2018: brought to the ED with altered mental status. CT PE study showed an acute right lower lobe segmental and subsegmental pulmonary emboli. Continued on Xarelto therapy.  3) 02/28/2019: establish care with Dr. Lorenso Courier   #Normocytic Anemia 1) 12/05/2018: WBC 6.1, Hgb 11.8, MCV 98.9, Plt 396.  2) 01/29/2019: WBC 7.8, Hgb 8.9, Plt 437, MCV 100.4. TIBC 369, Iron 281, Iron Sat 76% 3) 02/28/2019: establish care with Dr. Lorenso Courier    CHIEF COMPLAINTS/PURPOSE OF CONSULTATION:  Pulmonary Embolism and anemia  HISTORY OF PRESENTING ILLNESS:  Debbie Dorsey 49 y.o. female with medical history significant for DM type II and schizophrenia who presents for evaluation of a PE and an acutely decreased Hgb.   On review of the previous records Debbie Dorsey was brought into the emergency department on 04/06/2018 with persistent tachycardia after being admitted to a behavioral health facility.  As part of her work-up for tachycardia she underwent a CT PE study which showed bilateral pulmonary emboli involving the right lower lobar pulmonary artery right lower lobe segmental pulmonary artery, right upper lobe segmental artery, and left upper lobe segmental artery.  She was started on Xarelto therapy at that time.  She subsequently brought back to the emergency department on 05/25/2018 due to altered mental status.  During that time she underwent  another CT PE study which showed acute right lower lobe segmental and subsegmental pulmonary emboli.  It was thought that the new clot at that time was secondary to noncompliance with Xarelto therapy.  Was recommended that she continue the therapy with Xarelto.  Most recently on 01/29/2019 the patient was found to have a hemoglobin of 8.9, which is a considerable drop from our last hemoglobin on record on 12/05/2018 at which time the hemoglobin is 11.8.  Due to concern for this rapid onset anemia and for recommendations regarding anticoagulation management the patient was referred to hematology for further evaluation and management.  On exam today Debbie Dorsey has most of her questions answered by her grandfather who is her primary caretaker.  The patient does occasionally state "will you make me strong again?"  And can answer basic yes/no questions when asked directly.  She is otherwise reserved and waits for her grandfather to answer her questions for her.  On discussion today she notes that she is having no clear overt sources of bleeding while on Xarelto therapy.  He reports that her menstrual cycles are regular and that she uses about 3 pads per day while on her period.  Ports that she does pass large clots during this time.  He denies any other overt sources of bleeding including dark stools, bruising, or nosebleeds.  Reportedly she underwent a colonoscopy and EGD with no clear source of the bleeding.  He reports that she does get winded easily and has low energy.  He reports that she has never had a problem before and has never been treated for anemia.  On further discussion the grandfather reports that she has no dietary restrictions and has had no  other symptoms that are out of the ordinary in the last few months.  Full 10 point ROS was unable to be conducted due to patient status.  MEDICAL HISTORY:  Past Medical History:  Diagnosis Date  . Diabetes mellitus without complication ([place])   .  Schizophrenia ([place])     SURGICAL HISTORY: History reviewed. No pertinent surgical history.  SOCIAL HISTORY: Social History   Socioeconomic History  . Marital status: Single    Spouse name: Not on file  . Number of children: Not on file  . Years of education: Not on file  . Highest education level: Not on file  Occupational History  . Not on file  Tobacco Use  . Smoking status: Never Smoker  . Smokeless tobacco: Never Used  Substance and Sexual Activity  . Alcohol use: No  . Drug use: No  . Sexual activity: Not Currently  Other Topics Concern  . Not on file  Social History Narrative  . Not on file   Social Determinants of Health   Financial Resource Strain:   . Difficulty of Paying Living Expenses: Not on file  Food Insecurity:   . Worried About Charity fundraiser in the Last Year: Not on file  . Ran Out of Food in the Last Year: Not on file  Transportation Needs:   . Lack of Transportation (Medical): Not on file  . Lack of Transportation (Non-Medical): Not on file  Physical Activity:   . Days of Exercise per Week: Not on file  . Minutes of Exercise per Session: Not on file  Stress:   . Feeling of Stress : Not on file  Social Connections:   . Frequency of Communication with Friends and Family: Not on file  . Frequency of Social Gatherings with Friends and Family: Not on file  . Attends Religious Services: Not on file  . Active Member of Clubs or Organizations: Not on file  . Attends Archivist Meetings: Not on file  . Marital Status: Not on file  Intimate Partner Violence:   . Fear of Current or Ex-Partner: Not on file  . Emotionally Abused: Not on file  . Physically Abused: Not on file  . Sexually Abused: Not on file    FAMILY HISTORY: Family History  Problem Relation Age of Onset  . Mental illness Sister     ALLERGIES:  has No Known Allergies.  MEDICATIONS:  Current Outpatient Medications  Medication Sig Dispense Refill  . Rivaroxaban  15 & 20 MG TBPK Take as directed on package: Start with one 15mg  tablet by mouth twice a day with food. On Day 22, switch to one 20mg  tablet once a day with food. 51 each 0  . acetaminophen (TYLENOL) 325 MG tablet Take 650 mg by mouth every 6 (six) hours as needed for mild pain or headache.    . albuterol (VENTOLIN HFA) 108 (90 Base) MCG/ACT inhaler Inhale 2 puffs into the lungs every 4 (four) hours as needed for shortness of breath or wheezing.    . gabapentin (NEURONTIN) 300 MG capsule Take 1 capsule (300 mg total) by mouth 3 (three) times daily. 90 capsule 1  . insulin NPH-regular Human (70-30) 100 UNIT/ML injection Inject 32 Units into the skin 2 (two) times daily with a meal.    . mirtazapine (REMERON SOL-TAB) 15 MG disintegrating tablet Take 1 tablet (15 mg total) by mouth at bedtime. 90 tablet 2  . Multiple Vitamin (MULTIVITAMIN WITH MINERALS) TABS tablet Take 1 tablet by  mouth daily.    . pantoprazole (PROTONIX) 40 MG tablet Take 40 mg by mouth daily.    Marland Kitchen perphenazine (TRILAFON) 4 MG tablet 1 a day 3 at hs (Patient not taking: Reported on 02/28/2019) 120 tablet 3  . simvastatin (ZOCOR) 40 MG tablet Take 40 mg by mouth daily at 12 noon.     . traZODone (DESYREL) 50 MG tablet Take 1 tablet (50 mg total) by mouth at bedtime as needed for sleep. 90 tablet 1   No current facility-administered medications for this visit.    REVIEW OF SYSTEMS:   Unable to assess as patient is poor historian  PHYSICAL EXAMINATION: ECOG PERFORMANCE STATUS: 0 - Asymptomatic  Vitals:   02/28/19 1331  BP: 129/75  Pulse: (!) 129  Resp: 19  Temp: 98.3 F (36.8 C)  SpO2: 100%   Filed Weights   02/28/19 1331  Weight: 110 lb 12.8 oz (50.3 kg)    GENERAL: well appearing youthful appearing African American female in NAD  SKIN: skin color, texture, turgor are normal, no rashes or significant lesions EYES: conjunctiva are pink and non-injected, sclera clear LUNGS: clear to auscultation and percussion with  normal breathing effort HEART: regular rate & rhythm and no murmurs and no lower extremity edema Musculoskeletal: no cyanosis of digits and no clubbing  PSYCH: flat affect. Answers few questions. Asks "will you make me strong again" NEURO: no focal motor/sensory deficits  LABORATORY DATA:  I have reviewed the data as listed CBC Latest Ref Rng & Units 02/28/2019 12/05/2018 11/29/2018  WBC 4.0 - 10.5 K/uL 7.5 6.1 5.6  Hemoglobin 12.0 - 15.0 g/dL 9.0(L) 11.8(L) 12.6  Hematocrit 36.0 - 46.0 % 28.8(L) 36.8 39.0  Platelets 150 - 400 K/uL 470(H) 396 321    CMP Latest Ref Rng & Units 11/28/2018 08/11/2018 07/30/2018  Glucose 70 - 99 mg/dL 182(H) 127(H) 128(H)  BUN 6 - 20 mg/dL 7 12 6   Creatinine 0.44 - 1.00 mg/dL 0.73 0.63 0.65  Sodium 135 - 145 mmol/L 137 140 140  Potassium 3.5 - 5.1 mmol/L 3.7 4.2 3.5  Chloride 98 - 111 mmol/L 105 104 112(H)  CO2 22 - 32 mmol/L 23 28 23   Calcium 8.9 - 10.3 mg/dL 9.1 9.6 8.5(L)  Total Protein 6.5 - 8.1 g/dL 7.2 7.4 5.0(L)  Total Bilirubin 0.3 - 1.2 mg/dL 0.4 0.5 0.4  Alkaline Phos 38 - 126 U/L 62 49 42  AST 15 - 41 U/L 18 27 44(H)  ALT 0 - 44 U/L 18 23 32    PATHOLOGY: None to review.   BLOOD FILM: Review of the peripheral blood smear showed normal appearing white cells with neutrophils that were appropriately lobated and granulated. There was no predominance of bi-lobed or hyper-segmented neutrophils appreciated. No Dohle bodies were noted. There was no left shifting, immature forms or blasts noted. Lymphocytes remain normal in size without any predominance of large granular lymphocytes. Red cells show no anisopoikilocytosis, macrocytes , microcytes or polychromasia. There were no schistocytes, target cells, echinocytes, acanthocytes, dacrocytes, or stomatocytes.There was no rouleaux formation, nucleated red cells, or intra-cellular inclusions noted. The platelets are normal in size, shape, and color without any clumping evident.  RADIOGRAPHIC STUDIES: I  have personally reviewed the radiological images as listed and agreed with the findings in the report. No results found.  ASSESSMENT & PLAN Debbie Dorsey 49 y.o. female with medical history significant for DM type II and schizophrenia who presents for evaluation of a PE and an acutely decreased Hgb.  The history is primarily obtained from the grandfather who is present at the visit today.  The patient speaks very little and therefore gathering information from her directly is difficult.  After review of the labs and discussion with the patient the findings are most consistent with an acute drop in hemoglobin, probably secondary to a bleed.  Reportedly the patient underwent colonoscopy and EGD with no clear source of the etiology, though we will need to obtain these records in order to confirm this.  Today our labs will focus on nutritional studies as well as lysis labs to assure that the patient is not having any alternative cause of her anemia.  Additionally the patient carries a diagnosis of pulmonary embolism which was diagnosed in February 2020.  Unfortunately based on the records and the history it is unclear at this time if the pulmonary embolism was provoked.  Technically given this presentation lifelong anticoagulation would not be unreasonable, however the patient is very young and had this recent drop in her hemoglobin, therefore I think it would be reasonable to discontinue therapy with the understanding that her risk of developing another thrombus was elevated over that of the general population.  Her grandfather voiced his understanding of his reasoning and was also agreeable to discontinue anticoagulation.  The only barrier to discontinuing at this time is her continued tachycardia.  Given this was her only presenting symptom prior to her last pulmonary embolism I think it would be reasonable to get a CT PE study to assure that there is no new thrombus causing her tachycardia.  #Normocytic  Anemia, acute --near normal Hgb in Oct 2020, though most recent labs from outside provider show drop to Hgb 8.9 with mild macrocytosis at MCV 100.4 --patient reportedly underwent GI intervention after being found to have decreased Hgb. We will need to obtain these records.  --today will obtain CBC, reticulocyte panel CMP, iron studies, Vitamin B12, folate, MMA, homocysteine, LDH, and haptoglobin --anticoagulation management recommendations described below.  --RTC in 3 months.   # Pulmonary Embolism --initially diagnosed in Feb 2020 and found to have more clot burden in April 2020 (thought to be 2/2 to non-compliance). Unclear if this is a provoked or unprovoked clot.  --discussion with patient and her grandfather regarding d/c of anticoagulation therapy. I think given her young age it would be reasonable to d/c therapy assuming she had no evidence of clot. Additionally her recent drop in Hgb is concerning for bleeding which would be further incentive for d/c of therapy.  --patient was tachycardic on visit today and I would be hesitant to d/c anticoagulation with her initial presenting symptoms still evident. I will request a CTA PE study to r/o new or acute thrombus. --pending the results of this scan can discuss d/c of anticoagulation therapy with the patient and her grandfather  Orders Placed This Encounter  Procedures  . CT ANGIO CHEST PE W OR WO CONTRAST    Standing Status:   Future    Standing Expiration Date:   05/28/2020    Order Specific Question:   If indicated for the ordered procedure, I authorize the administration of contrast media per Radiology protocol    Answer:   Yes    Order Specific Question:   Is patient pregnant?    Answer:   No    Order Specific Question:   Preferred imaging location?    Answer:   [facility]    Order Specific Question:   Radiology Contrast Protocol - do  NOT remove file path    Answer:   [email or web address]  . CBC with  Differential ([place] Only)    Standing Status:   Future    Number of Occurrences:   1    Standing Expiration Date:   02/28/2020  . Retic Panel    Standing Status:   Future    Number of Occurrences:   1    Standing Expiration Date:   02/28/2020  . Save Smear (SSMR)    Standing Status:   Future    Number of Occurrences:   1    Standing Expiration Date:   02/28/2020  . CMP ([place] only)    Standing Status:   Future    Number of Occurrences:   1    Standing Expiration Date:   02/28/2020  . TSH    Standing Status:   Future    Number of Occurrences:   1    Standing Expiration Date:   02/28/2020  . Iron and TIBC    Standing Status:   Future    Number of Occurrences:   1    Standing Expiration Date:   02/28/2020  . Ferritin    Standing Status:   Future    Number of Occurrences:   1    Standing Expiration Date:   02/28/2020  . Vitamin B12    Standing Status:   Future    Number of Occurrences:   1    Standing Expiration Date:   02/28/2020  . Folate, Serum    Standing Status:   Future    Number of Occurrences:   1    Standing Expiration Date:   02/28/2020  . Methylmalonic acid, serum    Standing Status:   Future    Number of Occurrences:   1    Standing Expiration Date:   02/28/2020  . Lactate dehydrogenase (LDH)    Standing Status:   Future    Number of Occurrences:   1    Standing Expiration Date:   02/28/2020  . Homocysteine, serum    Standing Status:   Future    Number of Occurrences:   1    Standing Expiration Date:   02/28/2020  . Haptoglobin    Standing Status:   Future    Number of Occurrences:   1    Standing Expiration Date:   02/28/2020  . Copper, serum    Standing Status:   Future    Number of Occurrences:   1    Standing Expiration Date:   02/28/2020   All questions were answered. The patient knows to call the clinic with any problems, questions or concerns.  A total of more than 60 minutes were spent on this encounter and over half of that time was spent  on counseling and coordination of care as outlined above.   Ledell Peoples, MD Department of Hematology/Oncology [place] at [facility] Phone: [phone] Pager: [phone] Email: [email or web address]  02/28/2019 3:09 PM

## 2019-02-28 ENCOUNTER — Inpatient Hospital Stay (HOSPITAL_BASED_OUTPATIENT_CLINIC_OR_DEPARTMENT_OTHER): Payer: Medicare Other | Admitting: Hematology and Oncology

## 2019-02-28 ENCOUNTER — Inpatient Hospital Stay: Payer: Medicare Other | Attending: Hematology and Oncology

## 2019-02-28 ENCOUNTER — Other Ambulatory Visit: Payer: Self-pay

## 2019-02-28 ENCOUNTER — Encounter: Payer: Self-pay | Admitting: Hematology and Oncology

## 2019-02-28 VITALS — BP 129/75 | HR 129 | Temp 98.3°F | Resp 19 | Ht 58.5 in | Wt 110.8 lb

## 2019-02-28 DIAGNOSIS — I2699 Other pulmonary embolism without acute cor pulmonale: Secondary | ICD-10-CM

## 2019-02-28 DIAGNOSIS — F209 Schizophrenia, unspecified: Secondary | ICD-10-CM | POA: Insufficient documentation

## 2019-02-28 DIAGNOSIS — Z79899 Other long term (current) drug therapy: Secondary | ICD-10-CM | POA: Diagnosis not present

## 2019-02-28 DIAGNOSIS — E119 Type 2 diabetes mellitus without complications: Secondary | ICD-10-CM

## 2019-02-28 DIAGNOSIS — D649 Anemia, unspecified: Secondary | ICD-10-CM

## 2019-02-28 DIAGNOSIS — I2694 Multiple subsegmental pulmonary emboli without acute cor pulmonale: Secondary | ICD-10-CM

## 2019-02-28 LAB — CMP (CANCER CENTER ONLY)
ALT: 10 U/L (ref 0–44)
AST: 12 U/L — ABNORMAL LOW (ref 15–41)
Albumin: 4.1 g/dL (ref 3.5–5.0)
Alkaline Phosphatase: 75 U/L (ref 38–126)
Anion gap: 11 (ref 5–15)
BUN: 12 mg/dL (ref 6–20)
CO2: 19 mmol/L — ABNORMAL LOW (ref 22–32)
Calcium: 9.1 mg/dL (ref 8.9–10.3)
Chloride: 108 mmol/L (ref 98–111)
Creatinine: 1.03 mg/dL — ABNORMAL HIGH (ref 0.44–1.00)
GFR, Est AFR Am: >60 mL/min (ref >60)
GFR, Estimated: >60 mL/min (ref >60)
Glucose, Bld: 343 mg/dL — ABNORMAL HIGH (ref 70–99)
Potassium: 4.4 mmol/L (ref 3.5–5.1)
Sodium: 138 mmol/L (ref 135–145)
Total Bilirubin: <0.2 mg/dL — ABNORMAL LOW (ref 0.3–1.2)
Total Protein: 7.6 g/dL (ref 6.5–8.1)

## 2019-02-28 LAB — CBC WITH DIFFERENTIAL (CANCER CENTER ONLY)
Abs Immature Granulocytes: 0.02 10*3/uL (ref 0.00–0.07)
Basophils Absolute: 0.1 10*3/uL (ref 0.0–0.1)
Basophils Relative: 1 %
Eosinophils Absolute: 0.3 10*3/uL (ref 0.0–0.5)
Eosinophils Relative: 4 %
HCT: 28.8 % — ABNORMAL LOW (ref 36.0–46.0)
Hemoglobin: 9 g/dL — ABNORMAL LOW (ref 12.0–15.0)
Immature Granulocytes: 0 %
Lymphocytes Relative: 37 %
Lymphs Abs: 2.8 10*3/uL (ref 0.7–4.0)
MCH: 29.8 pg (ref 26.0–34.0)
MCHC: 31.3 g/dL (ref 30.0–36.0)
MCV: 95.4 fL (ref 80.0–100.0)
Monocytes Absolute: 0.4 10*3/uL (ref 0.1–1.0)
Monocytes Relative: 5 %
Neutro Abs: 4 10*3/uL (ref 1.7–7.7)
Neutrophils Relative %: 53 %
Platelet Count: 470 10*3/uL — ABNORMAL HIGH (ref 150–400)
RBC: 3.02 MIL/uL — ABNORMAL LOW (ref 3.87–5.11)
RDW: 14.3 % (ref 11.5–15.5)
WBC Count: 7.5 10*3/uL (ref 4.0–10.5)
nRBC: 0.4 % — ABNORMAL HIGH (ref 0.0–0.2)

## 2019-02-28 LAB — FOLATE: Folate: 21.6 ng/mL (ref >5.9)

## 2019-02-28 LAB — LACTATE DEHYDROGENASE: LDH: 180 U/L (ref 98–192)

## 2019-02-28 LAB — RETIC PANEL
Immature Retic Fract: 39.5 % — ABNORMAL HIGH (ref 2.3–15.9)
RBC.: 2.96 MIL/uL — ABNORMAL LOW (ref 3.87–5.11)
Retic Count, Absolute: 77 10*3/uL (ref 19.0–186.0)
Retic Ct Pct: 2.6 % (ref 0.4–3.1)
Reticulocyte Hemoglobin: 36.1 pg (ref >27.9)

## 2019-02-28 LAB — VITAMIN B12: Vitamin B-12: 628 pg/mL (ref 180–914)

## 2019-02-28 LAB — SAVE SMEAR(SSMR), FOR PROVIDER SLIDE REVIEW

## 2019-03-01 LAB — HAPTOGLOBIN: Haptoglobin: 151 mg/dL (ref 42–296)

## 2019-03-03 ENCOUNTER — Telehealth: Payer: Self-pay | Admitting: Hematology and Oncology

## 2019-03-03 LAB — IRON AND TIBC
Iron: 215 ug/dL — ABNORMAL HIGH (ref 41–142)
Saturation Ratios: 51 % (ref 21–57)
TIBC: 425 ug/dL (ref 236–444)
UIBC: 210 ug/dL (ref 120–384)

## 2019-03-03 LAB — TSH: TSH: 0.926 u[IU]/mL (ref 0.308–3.960)

## 2019-03-03 LAB — HOMOCYSTEINE: Homocysteine: 5.5 umol/L (ref 0.0–14.5)

## 2019-03-03 LAB — FERRITIN: Ferritin: 21 ng/mL (ref 11–307)

## 2019-03-03 NOTE — Telephone Encounter (Signed)
Scheduled per los. Called and spoke with patient. Confirmed appt 

## 2019-03-04 LAB — COPPER, SERUM: Copper: 170 ug/dL — ABNORMAL HIGH (ref 80–158)

## 2019-03-05 ENCOUNTER — Other Ambulatory Visit: Payer: Self-pay

## 2019-03-05 ENCOUNTER — Ambulatory Visit (HOSPITAL_COMMUNITY)
Admission: RE | Admit: 2019-03-05 | Discharge: 2019-03-05 | Disposition: A | Discharge status: Home / Self Care | Payer: Medicare Other | Source: Ambulatory Visit | Attending: Hematology and Oncology | Admitting: Hematology and Oncology

## 2019-03-05 DIAGNOSIS — I2694 Multiple subsegmental pulmonary emboli without acute cor pulmonale: Secondary | ICD-10-CM | POA: Diagnosis present

## 2019-03-05 MED ORDER — IOHEXOL 350 MG/ML SOLN
100.0000 mL | Freq: Once | INTRAVENOUS | Status: AC | PRN
  Administered 2019-03-05: 100 mL via INTRAVENOUS

## 2019-03-05 MED ORDER — SODIUM CHLORIDE (PF) 0.9 % IJ SOLN
INTRAMUSCULAR | Status: AC
  Filled 2019-03-05: qty 50

## 2019-03-06 ENCOUNTER — Telehealth: Payer: Self-pay | Admitting: Hematology and Oncology

## 2019-03-06 NOTE — Telephone Encounter (Signed)
Called Mr. Marland Kitchen spell the grandfather of Ms. Patrici Ranks discussed the results of her CTA and blood work.  Findings on the blood work are most consistent with an iron deficiency anemia.  Patient is currently taking p.o. iron as prescribed by another provider.  Recommend continuation of this p.o. iron therapy.  Results of the CTA showed no evidence of a PE or residual thrombus.  Given this finding I would recommend discontinuation of her Xarelto therapy given her drop in hemoglobin as well as her young age.  I will have the patient return to clinic to assure no recurrent VTE and determine if the iron therapy is adequately improving her iron stores.  Mr. Teryl Lucy voiced his understanding of this assessment and plan.  Ledell Peoples, MD Department of Hematology/Oncology [place] at [facility] Phone: [phone] Pager: [phone] Email: [email or web address]

## 2019-03-07 LAB — METHYLMALONIC ACID, SERUM: Methylmalonic Acid, Quantitative: 89 nmol/L (ref 0–378)

## 2019-05-29 ENCOUNTER — Other Ambulatory Visit: Payer: Self-pay | Admitting: Hematology and Oncology

## 2019-05-29 ENCOUNTER — Inpatient Hospital Stay: Payer: Medicare Other

## 2019-05-29 ENCOUNTER — Other Ambulatory Visit: Payer: Self-pay

## 2019-05-29 ENCOUNTER — Encounter: Payer: Self-pay | Admitting: Hematology and Oncology

## 2019-05-29 ENCOUNTER — Inpatient Hospital Stay: Payer: Medicare Other | Attending: Hematology and Oncology | Admitting: Hematology and Oncology

## 2019-05-29 VITALS — BP 142/87 | HR 110 | Temp 98.3°F | Resp 17 | Ht 58.5 in | Wt 121.7 lb

## 2019-05-29 DIAGNOSIS — Z7901 Long term (current) use of anticoagulants: Secondary | ICD-10-CM | POA: Insufficient documentation

## 2019-05-29 DIAGNOSIS — I2699 Other pulmonary embolism without acute cor pulmonale: Secondary | ICD-10-CM | POA: Insufficient documentation

## 2019-05-29 DIAGNOSIS — D500 Iron deficiency anemia secondary to blood loss (chronic): Secondary | ICD-10-CM

## 2019-05-29 DIAGNOSIS — E119 Type 2 diabetes mellitus without complications: Secondary | ICD-10-CM | POA: Insufficient documentation

## 2019-05-29 DIAGNOSIS — F209 Schizophrenia, unspecified: Secondary | ICD-10-CM | POA: Diagnosis not present

## 2019-05-29 DIAGNOSIS — Z79899 Other long term (current) drug therapy: Secondary | ICD-10-CM | POA: Insufficient documentation

## 2019-05-29 DIAGNOSIS — D649 Anemia, unspecified: Secondary | ICD-10-CM

## 2019-05-29 DIAGNOSIS — Z794 Long term (current) use of insulin: Secondary | ICD-10-CM | POA: Insufficient documentation

## 2019-05-29 LAB — CMP (CANCER CENTER ONLY)
ALT: 14 U/L (ref 0–44)
AST: 14 U/L — ABNORMAL LOW (ref 15–41)
Albumin: 4 g/dL (ref 3.5–5.0)
Alkaline Phosphatase: 69 U/L (ref 38–126)
Anion gap: 7 (ref 5–15)
BUN: 18 mg/dL (ref 6–20)
CO2: 21 mmol/L — ABNORMAL LOW (ref 22–32)
Calcium: 9.5 mg/dL (ref 8.9–10.3)
Chloride: 109 mmol/L (ref 98–111)
Creatinine: 0.93 mg/dL (ref 0.44–1.00)
GFR, Est AFR Am: >60 mL/min (ref >60)
GFR, Estimated: >60 mL/min (ref >60)
Glucose, Bld: 227 mg/dL — ABNORMAL HIGH (ref 70–99)
Potassium: 4.5 mmol/L (ref 3.5–5.1)
Sodium: 137 mmol/L (ref 135–145)
Total Bilirubin: 0.2 mg/dL — ABNORMAL LOW (ref 0.3–1.2)
Total Protein: 7.5 g/dL (ref 6.5–8.1)

## 2019-05-29 LAB — CBC WITH DIFFERENTIAL (CANCER CENTER ONLY)
Abs Immature Granulocytes: 0.01 10*3/uL (ref 0.00–0.07)
Basophils Absolute: 0 10*3/uL (ref 0.0–0.1)
Basophils Relative: 1 %
Eosinophils Absolute: 0.3 10*3/uL (ref 0.0–0.5)
Eosinophils Relative: 5 %
HCT: 40.8 % (ref 36.0–46.0)
Hemoglobin: 13.3 g/dL (ref 12.0–15.0)
Immature Granulocytes: 0 %
Lymphocytes Relative: 47 %
Lymphs Abs: 2.8 10*3/uL (ref 0.7–4.0)
MCH: 31.2 pg (ref 26.0–34.0)
MCHC: 32.6 g/dL (ref 30.0–36.0)
MCV: 95.8 fL (ref 80.0–100.0)
Monocytes Absolute: 0.3 10*3/uL (ref 0.1–1.0)
Monocytes Relative: 6 %
Neutro Abs: 2.4 10*3/uL (ref 1.7–7.7)
Neutrophils Relative %: 41 %
Platelet Count: 319 10*3/uL (ref 150–400)
RBC: 4.26 MIL/uL (ref 3.87–5.11)
RDW: 14.4 % (ref 11.5–15.5)
WBC Count: 5.9 10*3/uL (ref 4.0–10.5)
nRBC: 0 % (ref 0.0–0.2)

## 2019-05-29 LAB — IRON AND TIBC
Iron: 155 ug/dL — ABNORMAL HIGH (ref 41–142)
Saturation Ratios: 44 % (ref 21–57)
TIBC: 349 ug/dL (ref 236–444)
UIBC: 194 ug/dL (ref 120–384)

## 2019-05-29 LAB — FOLATE: Folate: 14.2 ng/mL (ref >5.9)

## 2019-05-29 LAB — RETIC PANEL
Immature Retic Fract: 17.6 % — ABNORMAL HIGH (ref 2.3–15.9)
RBC.: 4.24 MIL/uL (ref 3.87–5.11)
Retic Count, Absolute: 76.3 10*3/uL (ref 19.0–186.0)
Retic Ct Pct: 1.8 % (ref 0.4–3.1)
Reticulocyte Hemoglobin: 37.5 pg (ref >27.9)

## 2019-05-29 LAB — FERRITIN: Ferritin: 24 ng/mL (ref 11–307)

## 2019-05-29 LAB — VITAMIN B12: Vitamin B-12: 439 pg/mL (ref 180–914)

## 2019-05-29 LAB — SAVE SMEAR(SSMR), FOR PROVIDER SLIDE REVIEW

## 2019-05-29 NOTE — Progress Notes (Signed)
[place] [phone]   Fax:([phone]) [phone]  PROGRESS NOTE  Patient Care Team: Prince Solian, MD as PCP - General (Internal Medicine)  Hematological/Oncological History # Pulmonary Embolism 1)04/06/2018: brought to the ED with persistent tachycardia after being admitted to [facility]. CT PE study showed small bilateral pulmonary emboli involving the right lower lobar pulmonary artery, right lower lobe segmental pulmonary artery, right upper lobe segmental pulmonary artery, and left upper lobe segmental pulmonary artery. 2) 05/25/2018: brought to the ED with altered mental status. CT PE study showed an acute right lower lobe segmental and subsegmental pulmonary emboli. Continued on Xarelto therapy.  3) 02/28/2019: establish care with Dr. Lorenso Courier  4) 03/05/2019: CT PE study performed due to continued shortness of breath. 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 Recommend d/c Xarelto therapy.   #Normocytic Anemia 1) 12/05/2018: WBC 6.1, Hgb 11.8, MCV 98.9, Plt 396.  2) 01/29/2019: WBC 7.8, Hgb 8.9, Plt 437, MCV 100.4. TIBC 369, Iron 281, Iron Sat 76% 3) 02/28/2019: establish care with Dr. Lorenso Courier. WBC 7.5, Hgb 9.0, Plt 470 4) 05/29/2019: WBC 5.9, Hgb 13.3, MCV 95.8, Plt 319.   Interval History:  Debbie Dorsey 49 y.o. female with medical history significant for normocytic anemia and PE presents for a follow up visit. The patient's last visit was on 02/28/2019 to establish care. In the interim since the last visit she underwent a CT PE study on 03/05/2019 which showed no no residual or recurrent VTE and anticoagulation therapy was d/c.   On exam today Debbie Dorsey notes that she feels considerably better compared to her prior visit in January.  She is accompanied by her grandfather who confirms her improvement in symptoms.  He notes that she has been taking the iron pills once daily with orange juice as prescribed and that she has not had any issues with  constipation or abdominal pain.  Patient also reports that she feels stronger and her energy levels have improved.  She denies any overt signs of bleeding such as nosebleed, bruising, or blood in the stool.  Her appetite has improved and she is currently in the process of gaining weight.  She has increased by 11 pounds since her last visit.  Overall her grandfather notes that he is happy with her recent improvement as is the patient.  A full 10 point ROS is below.  MEDICAL HISTORY:  Past Medical History:  Diagnosis Date  . Diabetes mellitus without complication ([place])   . Schizophrenia ([place])     SURGICAL HISTORY: No past surgical history on file.  SOCIAL HISTORY: Social History   Socioeconomic History  . Marital status: Single    Spouse name: Not on file  . Number of children: Not on file  . Years of education: Not on file  . Highest education level: Not on file  Occupational History  . Not on file  Tobacco Use  . Smoking status: Never Smoker  . Smokeless tobacco: Never Used  Substance and Sexual Activity  . Alcohol use: No  . Drug use: No  . Sexual activity: Not Currently  Other Topics Concern  . Not on file  Social History Narrative  . Not on file   Social Determinants of Health   Financial Resource Strain:   . Difficulty of Paying Living Expenses:   Food Insecurity:   . Worried About Charity fundraiser in the Last Year:   . Arboriculturist in the Last Year:   Transportation Needs:   .  Lack of Transportation (Medical):   Marland Kitchen Lack of Transportation (Non-Medical):   Physical Activity:   . Days of Exercise per Week:   . Minutes of Exercise per Session:   Stress:   . Feeling of Stress :   Social Connections:   . Frequency of Communication with Friends and Family:   . Frequency of Social Gatherings with Friends and Family:   . Attends Religious Services:   . Active Member of Clubs or Organizations:   . Attends Archivist Meetings:   Marland Kitchen Marital Status:    Intimate Partner Violence:   . Fear of Current or Ex-Partner:   . Emotionally Abused:   Marland Kitchen Physically Abused:   . Sexually Abused:     FAMILY HISTORY: Family History  Problem Relation Age of Onset  . Mental illness Sister     ALLERGIES:  has No Known Allergies.  MEDICATIONS:  Current Outpatient Medications  Medication Sig Dispense Refill  . acetaminophen (TYLENOL) 325 MG tablet Take 650 mg by mouth every 6 (six) hours as needed for mild pain or headache.    . albuterol (VENTOLIN HFA) 108 (90 Base) MCG/ACT inhaler Inhale 2 puffs into the lungs every 4 (four) hours as needed for shortness of breath or wheezing.    . gabapentin (NEURONTIN) 300 MG capsule Take 1 capsule (300 mg total) by mouth 3 (three) times daily. 90 capsule 1  . insulin NPH-regular Human (70-30) 100 UNIT/ML injection Inject 32 Units into the skin 2 (two) times daily with a meal.    . mirtazapine (REMERON SOL-TAB) 15 MG disintegrating tablet Take 1 tablet (15 mg total) by mouth at bedtime. 90 tablet 2  . Multiple Vitamin (MULTIVITAMIN WITH MINERALS) TABS tablet Take 1 tablet by mouth daily.    . pantoprazole (PROTONIX) 40 MG tablet Take 40 mg by mouth daily.    Marland Kitchen perphenazine (TRILAFON) 4 MG tablet 1 a day 3 at hs (Patient not taking: Reported on 02/28/2019) 120 tablet 3  . Rivaroxaban 15 & 20 MG TBPK Take as directed on package: Start with one 15mg  tablet by mouth twice a day with food. On Day 22, switch to one 20mg  tablet once a day with food. 51 each 0  . simvastatin (ZOCOR) 40 MG tablet Take 40 mg by mouth daily at 12 noon.     . traZODone (DESYREL) 50 MG tablet Take 1 tablet (50 mg total) by mouth at bedtime as needed for sleep. 90 tablet 1   No current facility-administered medications for this visit.    REVIEW OF SYSTEMS:   Constitutional: ( - ) fevers, ( - )  chills , ( - ) night sweats Eyes: ( - ) blurriness of vision, ( - ) double vision, ( - ) watery eyes Ears, nose, mouth, throat, and face: ( - )  mucositis, ( - ) sore throat Respiratory: ( - ) cough, ( - ) dyspnea, ( - ) wheezes Cardiovascular: ( - ) palpitation, ( - ) chest discomfort, ( - ) lower extremity swelling Gastrointestinal:  ( - ) nausea, ( - ) heartburn, ( - ) change in bowel habits Skin: ( - ) abnormal skin rashes Lymphatics: ( - ) new lymphadenopathy, ( - ) easy bruising Neurological: ( - ) numbness, ( - ) tingling, ( - ) new weaknesses Behavioral/Psych: ( - ) mood change, ( - ) new changes  All other systems were reviewed with the patient and are negative.  PHYSICAL EXAMINATION: ECOG PERFORMANCE STATUS: 0 - Asymptomatic  Vitals:   05/29/19 1008  BP: (!) 142/87  Pulse: (!) 110  Resp: 17  Temp: 98.3 F (36.8 C)  SpO2: 99%   Filed Weights   05/29/19 1008  Weight: 121 lb 11.2 oz (55.2 kg)    GENERAL: well appearing youthful appearing African American female in NAD  SKIN: skin color, texture, turgor are normal, no rashes or significant lesions EYES: conjunctiva are pink and non-injected, sclera clear LUNGS: clear to auscultation and percussion with normal breathing effort HEART: regular rate & rhythm and no murmurs and no lower extremity edema Musculoskeletal: no cyanosis of digits and no clubbing  PSYCH: improved affect from prior. More variation in tone and improved communication.  NEURO: no focal motor/sensory deficits  LABORATORY DATA:  I have reviewed the data as listed CBC Latest Ref Rng & Units 05/29/2019 02/28/2019 12/05/2018  WBC 4.0 - 10.5 K/uL 5.9 7.5 6.1  Hemoglobin 12.0 - 15.0 g/dL 13.3 9.0(L) 11.8(L)  Hematocrit 36.0 - 46.0 % 40.8 28.8(L) 36.8  Platelets 150 - 400 K/uL 319 470(H) 396    CMP Latest Ref Rng & Units 05/29/2019 02/28/2019 11/28/2018  Glucose 70 - 99 mg/dL 227(H) 343(H) 182(H)  BUN 6 - 20 mg/dL 18 12 7   Creatinine 0.44 - 1.00 mg/dL 0.93 1.03(H) 0.73  Sodium 135 - 145 mmol/L 137 138 137  Potassium 3.5 - 5.1 mmol/L 4.5 4.4 3.7  Chloride 98 - 111 mmol/L 109 108 105  CO2 22 - 32  mmol/L 21(L) 19(L) 23  Calcium 8.9 - 10.3 mg/dL 9.5 9.1 9.1  Total Protein 6.5 - 8.1 g/dL 7.5 7.6 7.2  Total Bilirubin 0.3 - 1.2 mg/dL 0.2(L) <0.2(L) 0.4  Alkaline Phos 38 - 126 U/L 69 75 62  AST 15 - 41 U/L 14(L) 12(L) 18  ALT 0 - 44 U/L 14 10 18     RADIOGRAPHIC STUDIES: I have personally reviewed the radiological images as listed and agreed with the findings in the report: no evidence of VTE.   CLINICAL DATA:  Pulmonary embolism suspected, history of PE  EXAM: CT ANGIOGRAPHY CHEST WITH CONTRAST  TECHNIQUE: Multidetector CT imaging of the chest was performed using the standard protocol during bolus administration of intravenous contrast. Multiplanar CT image reconstructions and MIPs were obtained to evaluate the vascular anatomy.  CONTRAST:  137mL OMNIPAQUE IOHEXOL 350 MG/ML SOLN  COMPARISON:  05/25/2018  FINDINGS: Cardiovascular: Satisfactory opacification of the pulmonary arteries to the segmental level. No evidence of pulmonary embolism. Normal heart size. No pericardial effusion.  Mediastinum/Nodes: No enlarged mediastinal, hilar, or axillary lymph nodes. Thyroid gland, trachea, and esophagus demonstrate no significant findings.  Lungs/Pleura: Lungs are clear. No pleural effusion or pneumothorax.  Upper Abdomen: No acute abnormality.  Musculoskeletal: No chest wall abnormality. No acute or significant osseous findings.  Review of the MIP images confirms the above findings.  IMPRESSION: 1.  Negative examination for acute pulmonary embolism.  2. Previously noted pulmonary embolus on examination dated 05/25/2018 is resolved.   Electronically Signed   By: Eddie Candle M.D.   On: 03/05/2019 15:16   ASSESSMENT & PLAN Debbie Dorsey 49 y.o. female with medical history significant for normocytic anemia and PE presents for a follow up visit.  To review the labs and discussion with the patient her findings are most consistent with a marked  improvement of iron deficiency anemia while on p.o. iron therapy.  There has been a remarkable rebound in the hemoglobin from 9.0 up to 13.3.  Additionally the patient's thrombocytosis has resolved  and her iron studies are improving.  Given these findings I think is most likely that the cause of her anemia was iron deficiency anemia secondary to GYN blood loss.  I would recommend the patient continue her iron pills for at least an additional 3 months in order to build up her iron stores.  Given that we have identified the cause and the patient has improved on therapy there is no need for routine follow-up in our clinic any further.  In regards to her VTE it was not entirely clear whether this was a provoked or unprovoked incident.  Given her drop in hemoglobin and desire of her grandfather to discontinue inhalation therapy we repeated a CT PE study to assure that there was no residual thrombus given her symptoms and then discontinue therapy.  I think it is reasonable at this time to continue to monitor for recurrent VTE and have the patient return to our clinic in the event that there are signs or symptoms concerning for recurrent blood clot.  #Normocytic Anemia, resolved #Iron Deficiency Anemia, resolved --near normal Hgb in Oct 2020, though most recent labs from outside provider show drop to Hgb 8.9 with mild macrocytosis at MCV 100.4 --patient reportedly underwent GI intervention after being found to have decreased Hgb.  --today will repeat CBC, reticulocyte panel CMP, iron studies -- continue PO iron 325mg  daily with a source of vitamin C. Recommend she continue this for a minimum of 3 additional months.  --no need for routine f/u in our clinic as her labs and symptoms have markedly improved from her last visit.  --RTC PRN   # Pulmonary Embolism, resolved --initially diagnosed in Feb 2020 and found to have more clot burden in April 2020 (thought to be 2/2 to non-compliance). Unclear if this is a  provoked or unprovoked clot.  --previously had discussion with patient and her grandfather regarding d/c of anticoagulation therapy. I think given her young age it was reasonable to d/c Her drop in Hgb was concerning for bleeding which was further incentive for d/c of therapy.  -- CTA PE study on 03/05/2019 effectively r/o new or acute thrombus. --patient has been off anticoagulation therapy since 03/05/2019.   No orders of the defined types were placed in this encounter.   All questions were answered. The patient knows to call the clinic with any problems, questions or concerns.  A total of more than 20 minutes were spent on this encounter and over half of that time was spent on counseling and coordination of care as outlined above.   Ledell Peoples, MD Department of Hematology/Oncology [place] [facility] Phone: [phone] Pager: [phone] Email: [email or web address]  05/29/2019 11:44 AM

## 2020-07-14 ENCOUNTER — Other Ambulatory Visit: Payer: Self-pay | Admitting: Internal Medicine

## 2020-07-14 DIAGNOSIS — R109 Unspecified abdominal pain: Secondary | ICD-10-CM

## 2020-07-30 ENCOUNTER — Ambulatory Visit
Admission: RE | Admit: 2020-07-30 | Discharge: 2020-07-30 | Disposition: A | Discharge status: Home / Self Care | Payer: Medicare Other | Source: Ambulatory Visit | Attending: Internal Medicine | Admitting: Internal Medicine

## 2020-07-30 ENCOUNTER — Other Ambulatory Visit: Payer: Self-pay

## 2020-07-30 DIAGNOSIS — R109 Unspecified abdominal pain: Secondary | ICD-10-CM

## 2020-07-30 MED ORDER — IOPAMIDOL (ISOVUE-300) INJECTION 61%
100.0000 mL | Freq: Once | INTRAVENOUS | Status: AC | PRN
  Administered 2020-07-30: 100 mL via INTRAVENOUS

## 2020-08-02 ENCOUNTER — Telehealth: Payer: Self-pay

## 2020-08-02 DIAGNOSIS — N889 Noninflammatory disorder of cervix uteri, unspecified: Secondary | ICD-10-CM

## 2020-08-02 NOTE — Telephone Encounter (Signed)
Ultrasound order placed. 

## 2020-08-04 ENCOUNTER — Other Ambulatory Visit: Payer: Self-pay | Admitting: Internal Medicine

## 2020-08-04 DIAGNOSIS — N889 Noninflammatory disorder of cervix uteri, unspecified: Secondary | ICD-10-CM

## 2020-08-05 ENCOUNTER — Ambulatory Visit
Admission: RE | Admit: 2020-08-05 | Discharge: 2020-08-05 | Disposition: A | Discharge status: Home / Self Care | Payer: Medicare Other | Source: Ambulatory Visit | Attending: Internal Medicine | Admitting: Internal Medicine

## 2020-08-05 DIAGNOSIS — N889 Noninflammatory disorder of cervix uteri, unspecified: Secondary | ICD-10-CM

## 2020-08-16 ENCOUNTER — Ambulatory Visit: Payer: Medicare Other | Admitting: Obstetrics and Gynecology

## 2020-10-01 ENCOUNTER — Ambulatory Visit (INDEPENDENT_AMBULATORY_CARE_PROVIDER_SITE_OTHER): Payer: Medicare Other | Admitting: Obstetrics and Gynecology

## 2020-10-01 ENCOUNTER — Encounter: Payer: Self-pay | Admitting: Obstetrics and Gynecology

## 2020-10-01 ENCOUNTER — Other Ambulatory Visit (HOSPITAL_COMMUNITY)
Admission: RE | Admit: 2020-10-01 | Discharge: 2020-10-01 | Disposition: A | Discharge status: Home / Self Care | Payer: Medicare Other | Source: Ambulatory Visit | Attending: Obstetrics and Gynecology | Admitting: Obstetrics and Gynecology

## 2020-10-01 ENCOUNTER — Other Ambulatory Visit: Payer: Self-pay

## 2020-10-01 VITALS — BP 124/84 | HR 106 | Ht 63.5 in | Wt 125.0 lb

## 2020-10-01 DIAGNOSIS — Z01411 Encounter for gynecological examination (general) (routine) with abnormal findings: Secondary | ICD-10-CM

## 2020-10-01 DIAGNOSIS — Z01419 Encounter for gynecological examination (general) (routine) without abnormal findings: Secondary | ICD-10-CM

## 2020-10-01 DIAGNOSIS — Z1151 Encounter for screening for human papillomavirus (HPV): Secondary | ICD-10-CM | POA: Diagnosis not present

## 2020-10-01 NOTE — Progress Notes (Signed)
Subjective:     Debbie Dorsey is a 50 y.o. female P1 with BMI 21 and is here for a comprehensive physical exam. The patient reports lower abdominal pain for a while. Patient is seen alone and is a poor historian. She reports lower abdominal pain which radiates to her upper thigh and wakes her up at night. She states that tylenol eases her pain. The pain is present throughout the day but worst in the evening. She describes it as a sharp pain. Patient reports a monthly period lasting 5 days, heavy in flow. She is not sexually active. She reports a burning sensation in her vaginal and dysuria. She believes she has a "cyst in her private area" that needs to be drained. Patient is without any other complaints  Past Medical History:  Diagnosis Date   Diabetes mellitus without complication ([place])    Schizophrenia ([place])    History reviewed. No pertinent surgical history. Family History  Problem Relation Age of Onset   Mental illness Sister     Social History   Socioeconomic History   Marital status: Single    Spouse name: Not on file   Number of children: Not on file   Years of education: Not on file   Highest education level: Not on file  Occupational History   Not on file  Tobacco Use   Smoking status: Never   Smokeless tobacco: Never  Vaping Use   Vaping Use: Never used  Substance and Sexual Activity   Alcohol use: No   Drug use: No   Sexual activity: Not Currently  Other Topics Concern   Not on file  Social History Narrative   Not on file   Social Determinants of Health   Financial Resource Strain: Not on file  Food Insecurity: Not on file  Transportation Needs: Not on file  Physical Activity: Not on file  Stress: Not on file  Social Connections: Not on file  Intimate Partner Violence: Not on file   Health Maintenance  Topic Date Due   PNEUMOCOCCAL POLYSACCHARIDE VACCINE AGE 19-64 HIGH RISK  Never done   COVID-19 Vaccine (1) Never done   FOOT EXAM  Never done    OPHTHALMOLOGY EXAM  Never done   URINE MICROALBUMIN  Never done   Hepatitis C Screening  Never done   TETANUS/TDAP  Never done   PAP SMEAR-Modifier  Never done   COLONOSCOPY (Pts 45-47yr Insurance coverage will need to be confirmed)  Never done   HEMOGLOBIN A1C  06/05/2019   INFLUENZA VACCINE  09/06/2020   HIV Screening  Completed   Pneumococcal Vaccine 050663Years old  Aged Out   HPV VACCINES  Aged Out       Review of Systems Pertinent items noted in HPI and remainder of comprehensive ROS otherwise negative.   Objective:    GENERAL: Well-developed, well-nourished female in no acute distress.  HEENT: Normocephalic, atraumatic. Sclerae anicteric.  NECK: Supple. Normal thyroid.  LUNGS: Clear to auscultation bilaterally.  HEART: Regular rate and rhythm. BREASTS: Symmetric in size. No palpable masses or lymphadenopathy, skin changes, or nipple drainage. ABDOMEN: Soft, nontender, nondistended. No organomegaly. PELVIC: Normal external female genitalia. Vagina is pink and rugated.  Normal discharge. Normal appearing cervix. Uterus is normal in size. No adnexal mass or tenderness. Chaperone present during the pelvic exam EXTREMITIES: No cyanosis, clubbing, or edema, 2+ distal pulses.    CT ABDOMEN PELVIS W CONTRAST  Result Date: 07/30/2020 CLINICAL DATA:  Intermittent bloating and abdominal pain EXAM:  CT ABDOMEN AND PELVIS WITH CONTRAST TECHNIQUE: Multidetector CT imaging of the abdomen and pelvis was performed using the standard protocol following bolus administration of intravenous contrast. CONTRAST:  155m ISOVUE-300 IOPAMIDOL (ISOVUE-300) INJECTION 61% COMPARISON:  Abdominal radiograph July 16, 2018 and abdominal ultrasound March 24, 2003. FINDINGS: Lower chest: No acute abnormality. Normal size heart. No significant pericardial effusion/thickening. Hepatobiliary: No suspicious hepatic lesion. Gallbladder is unremarkable. No biliary ductal dilation. Pancreas: Within normal limits.  Spleen: Within normal limits. Adrenals/Urinary Tract: Bilateral adrenal glands are unremarkable. No hydronephrosis. Punctate nonobstructive right interpolar renal stone measuring 2-3 mm. No solid enhancing renal lesions. Urinary bladder is grossly unremarkable for degree of distension. Stomach/Bowel: Stomach is unremarkable. No pathologic dilation of small bowel. The stomach and terminal ileum are grossly unremarkable. Radiopaque enteric contrast traverses the hepatic flexure. Large volume of formed stool throughout the colon Vascular/Lymphatic: Aortic atherosclerosis without aneurysmal dilation. No pathologically enlarged abdominal or pelvic lymph nodes. Reproductive: Septated cystic lesion which appears to arise from the left adnexa, measuring 6.7 x 5.5 cm on image 57/2, with claw-like appearance of the ovarian parenchyma along the left lateral aspect of the cystic lesion as seen on image 52/3. Tiny right ovarian cyst likely dominant follicle, otherwise unremarkable. 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 Other: No abdominopelvic ascites.  No pneumoperitoneum. Musculoskeletal: No acute or significant osseous findings. IMPRESSION: 1. Septated 6.7 cm cystic left ovarian lesion. Because this lesion is not adequately characterized on CT, prompt [place] recommended for further evaluation. Note: This recommendation does not apply to premenarchal patients and to those with increased risk (genetic, family history, elevated tumor markers or other high-risk factors) of ovarian cancer. Reference: JACR 2020 Feb; 17(2):248-254 2. Large volume of formed stool throughout the colon suggesting constipation. 3. Punctate nonobstructive right renal stone. 4. 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 5.  Aortic Atherosclerosis (ICD10-I70.0). These results will be called to the ordering clinician or representative by the Radiologist Assistant, and communication documented in the PACS or [employer] Electronically Signed    By: JDahlia BailiffMD   On: 07/30/2020 14:10   [place] COMPLETE WITH TRANSVAGINAL  Result Date: 08/05/2020 CLINICAL DATA:  Premenopausal female with a cystic lesion in the pelvis seen on prior CT. EXAM: TRANSABDOMINAL ULTRASOUND OF PELVIS DOPPLER ULTRASOUND OF OVARIES TECHNIQUE: Transabdominal ultrasound examination of the pelvis was performed including evaluation of the uterus, ovaries, adnexal regions, and pelvic cul-de-sac. Color and duplex Doppler ultrasound was utilized to evaluate blood flow to the ovaries. COMPARISON:  CT abdomen pelvis dated 07/30/2020. FINDINGS: Uterus Measurements: 12.7 x 5.2 x 6.7 cm = volume: 232 mL. An isoechoic mass in the posterior aspect of the uterine fundus measures 4.3 x 4.0 x 4.1 cm. Endometrium Thickness: 9 mm.  No focal abnormality visualized. Right ovary Not definitely identified. Left ovary Not definitely identified. Pulsed Doppler evaluation demonstrates normal low-resistance arterial and venous waveforms in both ovaries. Other: A cyst with multiple thin septations located in the cul-de-sac measures 7.5 x 4.9 x 6.3 cm. This corresponds to the mass seen on prior CT. It is unclear on this exam if this cyst arises from an ovary. No color flow is identified within the septations. No definite solid nodule is identified as part of this cyst. IMPRESSION: 1. Cyst in the cul-de-sac with multiple thin septations is indeterminate. Surgical evaluation should be considered. This recommendation follows the consensus statement: Management of Asymptomatic Ovarian and Other Adnexal Cysts Imaged at [place] Society of Radiologists in [place] [facility] 2010; [phone] 2.  Mass in the uterus is most consistent with a leiomyoma. Electronically Signed   By: Zerita Boers M.D.   On: 08/05/2020 15:02    Assessment:    Healthy female exam.      Plan:    Pap smear  Screening mammogram ordered Reviewed imaging with the patient- Pelvic MRI ordered to  determine origin of pelvic mass Dicussed surgical intervention pending MRI Vaginal swab collected to rule out BV or yeast infection Urine culture collected See After Visit Summary for Counseling Recommendations

## 2020-10-01 NOTE — Progress Notes (Signed)
Pt presents today as new patient for yearly exam. States she is having pelvic pain. She has to wake up at night and take tylenol. Also states she has cyst on her vaginal area on the right side for about 1 month. Pt would also like to discuss ways to stop having cycles. LMP: 09/08/20. BCM: none. Pt states she is not sexually active.

## 2020-10-03 LAB — URINE CULTURE

## 2020-10-04 LAB — CERVICOVAGINAL ANCILLARY ONLY
Bacterial Vaginitis (gardnerella): NEGATIVE
Candida Glabrata: NEGATIVE
Candida Vaginitis: NEGATIVE
Comment: NEGATIVE
Comment: NEGATIVE
Comment: NEGATIVE

## 2020-10-06 LAB — CYTOLOGY - PAP
Comment: NEGATIVE
Diagnosis: NEGATIVE
High risk HPV: NEGATIVE

## 2020-10-18 ENCOUNTER — Ambulatory Visit (HOSPITAL_COMMUNITY)
Admission: RE | Admit: 2020-10-18 | Discharge: 2020-10-18 | Disposition: A | Discharge status: Home / Self Care | Payer: Medicare Other | Source: Ambulatory Visit | Attending: Obstetrics and Gynecology | Admitting: Obstetrics and Gynecology

## 2020-10-18 ENCOUNTER — Other Ambulatory Visit: Payer: Self-pay

## 2020-10-18 DIAGNOSIS — Z01411 Encounter for gynecological examination (general) (routine) with abnormal findings: Secondary | ICD-10-CM | POA: Diagnosis present

## 2020-10-18 MED ORDER — GADOBUTROL 1 MMOL/ML IV SOLN
7.5000 mL | Freq: Once | INTRAVENOUS | Status: AC | PRN
  Administered 2020-10-18: 7.5 mL via INTRAVENOUS

## 2020-10-19 ENCOUNTER — Telehealth: Payer: Self-pay | Admitting: *Deleted

## 2020-10-19 NOTE — Telephone Encounter (Signed)
Returned TC to patient's grandfather, legal guardian regarding request for results of MRI. Discussed MyChart release of information prior to MD review. Reassured guardian that Dr. Elly Modena would review the results and would follow up regarding any further instructions.

## 2020-10-25 ENCOUNTER — Other Ambulatory Visit: Payer: Self-pay | Admitting: Obstetrics and Gynecology

## 2020-10-25 ENCOUNTER — Ambulatory Visit (HOSPITAL_BASED_OUTPATIENT_CLINIC_OR_DEPARTMENT_OTHER): Admission: RE | Admit: 2020-10-25 | Payer: Medicare Other | Source: Ambulatory Visit | Admitting: Radiology

## 2020-10-25 DIAGNOSIS — R1900 Intra-abdominal and pelvic swelling, mass and lump, unspecified site: Secondary | ICD-10-CM

## 2020-10-26 ENCOUNTER — Ambulatory Visit (HOSPITAL_BASED_OUTPATIENT_CLINIC_OR_DEPARTMENT_OTHER): Payer: Medicare Other | Admitting: Radiology

## 2020-10-26 ENCOUNTER — Other Ambulatory Visit: Payer: Medicare Other

## 2020-10-26 ENCOUNTER — Ambulatory Visit (HOSPITAL_BASED_OUTPATIENT_CLINIC_OR_DEPARTMENT_OTHER)
Admission: EM | Admit: 2020-10-26 | Discharge: 2020-10-26 | Disposition: A | Discharge status: Home / Self Care | Payer: Medicare Other | Attending: Obstetrics and Gynecology | Admitting: Obstetrics and Gynecology

## 2020-10-26 ENCOUNTER — Other Ambulatory Visit: Payer: Self-pay

## 2020-10-26 DIAGNOSIS — Z01411 Encounter for gynecological examination (general) (routine) with abnormal findings: Secondary | ICD-10-CM | POA: Insufficient documentation

## 2020-10-26 DIAGNOSIS — Z1231 Encounter for screening mammogram for malignant neoplasm of breast: Secondary | ICD-10-CM | POA: Insufficient documentation

## 2020-10-27 LAB — CA 125: Cancer Antigen (CA) 125: 54.5 U/mL — ABNORMAL HIGH (ref 0.0–38.1)

## 2020-10-29 ENCOUNTER — Other Ambulatory Visit: Payer: Self-pay | Admitting: Obstetrics and Gynecology

## 2020-10-29 DIAGNOSIS — R1900 Intra-abdominal and pelvic swelling, mass and lump, unspecified site: Secondary | ICD-10-CM

## 2020-11-01 ENCOUNTER — Telehealth: Payer: Self-pay | Admitting: *Deleted

## 2020-11-01 ENCOUNTER — Other Ambulatory Visit: Payer: Self-pay

## 2020-11-01 ENCOUNTER — Encounter: Payer: Self-pay | Admitting: Obstetrics and Gynecology

## 2020-11-01 ENCOUNTER — Ambulatory Visit (INDEPENDENT_AMBULATORY_CARE_PROVIDER_SITE_OTHER): Payer: Medicare Other | Admitting: Obstetrics and Gynecology

## 2020-11-01 VITALS — BP 114/75 | HR 108 | Wt 123.0 lb

## 2020-11-01 DIAGNOSIS — R1900 Intra-abdominal and pelvic swelling, mass and lump, unspecified site: Secondary | ICD-10-CM | POA: Diagnosis not present

## 2020-11-01 NOTE — Progress Notes (Signed)
RGYN patient here to F/U on Imaging studies.  Last visit 10/01/20.  CC: pt notes still having vaginal burning had swab done last month was  WNL  No vaginal discharge notes itching sometimes

## 2020-11-01 NOTE — Progress Notes (Signed)
50 yo P1 presenting today to discuss MRI results and CA-125 which were obtained for the evaluation of a pelvic mass. Patient reports abdominal bloating and occasional pelvic pain which wakes her up in the evening. The pain is relieved with tylenol . She reports a monthly period associated with dysmenorrhea which is relieved with tylenol and ibuprofen  Past Medical History:  Diagnosis Date   Diabetes mellitus without complication ([place])    Schizophrenia ([place])    History reviewed. No pertinent surgical history. Family History  Problem Relation Age of Onset   Mental illness Sister    Social History   Tobacco Use   Smoking status: Never   Smokeless tobacco: Never  Vaping Use   Vaping Use: Never used  Substance Use Topics   Alcohol use: No   Drug use: No   ROS See pertinent in HPI. All other systems reviewed and non contributory GENERAL: Well-developed, well-nourished female in no acute distress.  NEURO: alert and oriented x 3  MR PELVIS W WO CONTRAST  Result Date: 10/18/2020 CLINICAL DATA:  Soft tissue mass in the pelvis. Previous imaging is nondiagnostic. EXAM: MRI PELVIS WITHOUT AND WITH CONTRAST TECHNIQUE: Multiplanar multisequence MR imaging of the pelvis was performed both before and after administration of intravenous contrast. CONTRAST:  7.54mL GADAVIST GADOBUTROL 1 MMOL/ML IV SOLN COMPARISON:  Previous CT and ultrasound imaging from August 05, 2020 and July 30, 2020 FINDINGS: Urinary Tract: Urinary bladder is unremarkable. No distal ureteral dilation. Bowel: Cystic area in the cul-de-sac abuts the sigmoid colon without signs of obstruction or colonic thickening, see below. Vascular/Lymphatic: No adenopathy in the pelvis. Normal caliber of pelvic vasculature. Reproductive: Uterus measures 12 x 5.9 x 7.8 cm. Endometrium is normal assuming this is a premenopausal patient at approximately 7 mm to 8 mm greatest thickness. Junctional zone is however thickened up to 19 mm in some areas. There  is no leiomyoma in the uterus. Thickened junctional zone shows areas of striated T2 signal and corresponding increased T1 signal. Cystic area in the cul-de-sac abuts and shows mild mass effect upon the adjacent sigmoid colon measuring approximately 5.5 x 3.1 x 6.1 cm. This shows no internal nodularity but does show septation. Ovaries appear to be along the RIGHT and LEFT pelvic sidewalls also displaced by this cystic area. T1 hyperintense lesion with mild T2 hypointensity and peripheral rim of T2 hypointensity best seen on image 22/3 and image 36/14 smaller adjacent lesion with similar imaging characteristics seen in the region of the LEFT adnexa. RIGHT adnexa with similar lesion (image 32/14 11 mm. Other small areas of T1 hyperintensity along the surface of the broad ligament. No internal enhancement in these areas. No nodularity with enhancement internally in the dominant cystic area in the cul-de-sac. Images are mildly motion degraded. Other:  None Musculoskeletal: No suspicious bone lesions identified. IMPRESSION: Cystic area in the cul-de-sac most suggestive of peritoneal inclusion cyst in the setting of endometriosis and adenomyosis. Gyn consultation is suggested for further evaluation. Suggest minimum short interval follow-up, initial follow-up at 3 months or less if there is concern based on elevated tumor markers or other clinical features that require more immediate assessment. Exact ovarian position is difficult to assess but appears to be along the pelvic sidewall displaced by the cystic area in the cul-de-sac and associated with small endometriomas. No evidence of leiomyoma in the uterus. Electronically Signed   By: Zetta Bills M.D.   On: 10/18/2020 15:51   US PELVIC COMPLETE WITH TRANSVAGINAL  Result Date:  08/05/2020 CLINICAL DATA:  Premenopausal female with a cystic lesion in the pelvis seen on prior CT. EXAM: TRANSABDOMINAL ULTRASOUND OF PELVIS DOPPLER ULTRASOUND OF OVARIES TECHNIQUE:  Transabdominal ultrasound examination of the pelvis was performed including evaluation of the uterus, ovaries, adnexal regions, and pelvic cul-de-sac. Color and duplex Doppler ultrasound was utilized to evaluate blood flow to the ovaries. COMPARISON:  CT abdomen pelvis dated 07/30/2020. FINDINGS: Uterus Measurements: 12.7 x 5.2 x 6.7 cm = volume: 232 mL. An isoechoic mass in the posterior aspect of the uterine fundus measures 4.3 x 4.0 x 4.1 cm. Endometrium Thickness: 9 mm.  No focal abnormality visualized. Right ovary Not definitely identified. Left ovary Not definitely identified. Pulsed Doppler evaluation demonstrates normal low-resistance arterial and venous waveforms in both ovaries. Other: A cyst with multiple thin septations located in the cul-de-sac measures 7.5 x 4.9 x 6.3 cm. This corresponds to the mass seen on prior CT. It is unclear on this exam if this cyst arises from an ovary. No color flow is identified within the septations. No definite solid nodule is identified as part of this cyst. IMPRESSION: 1. Cyst in the cul-de-sac with multiple thin septations is indeterminate. Surgical evaluation should be considered. This recommendation follows the consensus statement: Management of Asymptomatic Ovarian and Other Adnexal Cysts Imaged at [place]: Society of Radiologists in [place]. Radiology 2010; [phone]. 2. Mass in the uterus is most consistent with a leiomyoma. Electronically Signed   By: Zerita Boers M.D.   On: 08/05/2020 15:02     A/P 50 yo with a pelvic mass and elevated CA-125 - reviewed lab and radiologic findings - discussed need for surgical interventions - Given location of the mass and elevated Ca-125, patient has been referred to [facility] which is schedule on 9/30 at 9 am - Patient was seen today in the presence of her grandfather

## 2020-11-01 NOTE — Telephone Encounter (Signed)
Spoke with the patient and scheduled a new patient appt with Dr Berline Lopes on 9/30 at 9 am. Patient given the address and phone number for the clinic; along with the policy for mask and visitors

## 2020-11-04 ENCOUNTER — Encounter: Payer: Self-pay | Admitting: Gynecologic Oncology

## 2020-11-04 NOTE — Progress Notes (Signed)
GYNECOLOGIC ONCOLOGY NEW PATIENT CONSULTATION   Patient Name: Debbie Dorsey  Patient Age: 50 y.o. Date of Service: 11/05/20 Referring Provider: Mora Bellman, MD  Primary Care Provider: Prince Solian, MD Consulting Provider: Jeral Pinch, MD   Assessment/Plan:  Premenopausal patient with symptomatic pelvic mass.   I reviewed imaging findings and my exam with the patient and family present. There is a cystic mass within the cul de sac with rectum somewhat fixed on rectal exam. Difficult to determine if ovarian in origin on imaging. Given symptoms, I recommend surgical evaluation for diagnostic and therapeutic purposes.   We discussed that CA-125, which can be a tumor marker for ovarian cancer, is not a diagnostic test. It may be normal in up to 50% of early stage ovarian cancer and can be elevated in many noncancerous disease processes such as endometriosis. While the patient does not endorses symptoms that would suggest a history of endometriosis, this assessment is limited by her inability to give me detail with regards to her history.   Her grandfather is her healthcare POA. It is unclear to me whether he will need to sign surgical consent for her, so I have recommended that he come to her anesthesia appointment. I will plan to call him next week to review discussion today.   In the setting of diabetes, last A1c was 8.5%, we would ideally avoid open surgery. Repeat A1c will be obtained before surgery given increased cardiac and peri-operative risk with hyperglycemia.   We discussed plan today for robotic excision of mass, possible unilateral oophorectomy, bilateral salpingectomy, possible BSO, possible total hysterectomy, possible laparotomy, possible staging. Will plan to send mass for frozen section. If benign and does not involved either ovary, plan will be for removal of both tubes. If it involves one ovary, goal is to leave normal ovary in situ to allow for natural menopause. I  borderline tumor, then will plan on completion surgery with TRH/BSO. If malignancy, we discussed possible need for laparotomy, staging (including lymph node removal and omentectomy). Given somewhat fixed rectum on exam, the patient will do a bowel prep before surgery. We discussed possible risk of needing bowel resection that that goal would be reanastomosis. The patient voiced understanding that there is a risk of ostomy (temporary or permanent).   The risks of surgery were discussed in detail and she understands these to include infection; wound separation; hernia; vaginal cuff separation, injury to adjacent organs such as bowel, bladder, blood vessels, ureters and nerves; bleeding which may require blood transfusion; anesthesia risk; thromboembolic events; possible death; unforeseen complications; possible need for re-exploration; medical complications such as heart attack, stroke, pleural effusion and pneumonia; and, if full lymphadenectomy is performed the risk of lymphedema and lymphocyst. The patient will receive DVT and antibiotic prophylaxis as indicated. She voiced a clear understanding. She had the opportunity to ask questions. Perioperative instructions were reviewed with her. Prescriptions for post-op medications were sent to her pharmacy of choice.  A copy of this note was sent to the patient's referring provider.   70 minutes of total time was spent for this patient encounter, including preparation, face-to-face counseling with the patient and coordination of care, and documentation of the encounter.   Jeral Pinch, MD  Division of Gynecologic Oncology  Department of Obstetrics and Gynecology  [facility]  ___________________________________________  Chief Complaint: Chief Complaint  Patient presents with   Pelvic mass    History of Present Illness:  Debbie Dorsey is a 50 y.o.  y.o. female who is seen in consultation at the request of Dr. Elly Modena  for an evaluation of complex pelvic mass.  Patient is a rather poor historian.  She presents with her uncle and nephew today, who do not add much to her history.  Her healthcare power of attorney and caretaker is her grandfather who was not able to be here today secondary to having his own appointment.  Patient notes at least 6 months of pelvic pain which she describes as intermittent, happening most days, and fluctuating in nature.  It is usually sharp pain and she rates it at its highest at a 9 out of 10.  Activity increases her pain and it is sometimes associated with low back pain.  She takes Tylenol for her symptoms which helps to ease the pain.  She endorses a good appetite without nausea or emesis.  She notes normal bowel function although intermittently has pain in her low back when she defecates.  She denies any urinary symptoms.  She has a history of PE in 01/2019; repeat imaging in late 02/2019 showed no residual or recurrent VTE and her anticoagulation therapy was discontinued.  Medical history is also notable for diabetes with most recent hemoglobin A1c in July of 8.5%.  Patient lives in [place] grandfather.  She does not work.  She denies any tobacco or alcohol use.  PAST MEDICAL HISTORY:  Past Medical History:  Diagnosis Date   Diabetes mellitus without complication ([place])    History of pulmonary embolism    Schizophrenia ([place])      PAST SURGICAL HISTORY:  Past Surgical History:  Procedure Laterality Date   BREAST SURGERY      OB/GYN HISTORY:  OB History  Gravida Para Term Preterm AB Living  1 1 1  0 0 1  SAB IAB Ectopic Multiple Live Births  0 0 0 0 1    # Outcome Date GA Lbr Len/2nd Weight Sex Delivery Anes PTL Lv  1 Term             No LMP recorded.  Age at menarche: 6 Age at menopause: Not applicable, patient continues to have regular monthly menses Hx of HRT: Not applicable Hx of STDs: Denies Last pap: 09/2020 - [facility], HPV HR negative History of abnormal  pap smears: no  SCREENING STUDIES:  Last mammogram: 2022  Last colonoscopy: n/a  MEDICATIONS: Outpatient Encounter Medications as of 11/05/2020  Medication Sig   acetaminophen (TYLENOL) 325 MG tablet Take 650 mg by mouth every 6 (six) hours as needed for mild pain or headache.   albuterol (VENTOLIN HFA) 108 (90 Base) MCG/ACT inhaler Inhale 2 puffs into the lungs every 4 (four) hours as needed for shortness of breath or wheezing.   gabapentin (NEURONTIN) 300 MG capsule Take 1 capsule (300 mg total) by mouth 3 (three) times daily.   HUMALOG KWIKPEN 100 UNIT/ML KwikPen Inject into the skin with breakfast, with lunch, and with evening meal. Inject up to 25 units at meals 200-250: 1 unit 251-300: 2 units 301-350: 3 units >500: 5 units   LANTUS SOLOSTAR 100 UNIT/ML Solostar Pen SMARTSIG:30 Unit(s) SUB-Q Daily   mirtazapine (REMERON SOL-TAB) 15 MG disintegrating tablet Take 1 tablet (15 mg total) by mouth at bedtime.   Multiple Vitamin (MULTIVITAMIN WITH MINERALS) TABS tablet Take 1 tablet by mouth daily.   pantoprazole (PROTONIX) 40 MG tablet Take 40 mg by mouth daily.   perphenazine (TRILAFON) 4 MG tablet 1 a day 3 at hs   rosuvastatin (CRESTOR)  5 MG tablet Take 5 mg by mouth daily.   [DISCONTINUED] INGREZZA 40 MG capsule Take 40 mg by mouth daily.   [DISCONTINUED] insulin NPH-regular Human (70-30) 100 UNIT/ML injection Inject 32 Units into the skin 2 (two) times daily with a meal. (Patient not taking: Reported on 11/04/2020)   [DISCONTINUED] risperidone (RISPERDAL) 4 MG tablet Take by mouth.   [DISCONTINUED] Rivaroxaban 15 & 20 MG TBPK Take as directed on package: Start with one 15mg  tablet by mouth twice a day with food. On Day 22, switch to one 20mg  tablet once a day with food. (Patient not taking: No sig reported)   [DISCONTINUED] simvastatin (ZOCOR) 40 MG tablet Take 40 mg by mouth daily at 12 noon.  (Patient not taking: Reported on 11/04/2020)   [DISCONTINUED] traZODone (DESYREL) 50 MG  tablet Take 1 tablet (50 mg total) by mouth at bedtime as needed for sleep. (Patient not taking: Reported on 11/04/2020)   No facility-administered encounter medications on file as of 11/05/2020.    ALLERGIES:  No Known Allergies   FAMILY HISTORY:  Family History  Problem Relation Age of Onset   Breast cancer Mother    Prostate cancer Father    Mental illness Sister      SOCIAL HISTORY:  Social Connections: Not on file    REVIEW OF SYSTEMS:  Denies appetite changes, fevers, chills, fatigue, unexplained weight changes. Denies hearing loss, neck lumps or masses, mouth sores, ringing in ears or voice changes. Denies cough or wheezing.  Denies shortness of breath. Denies chest pain or palpitations. Denies leg swelling. Denies abdominal distention, pain, blood in stools, constipation, diarrhea, nausea, vomiting, or early satiety. Denies pain with intercourse, dysuria, frequency, hematuria or incontinence. Denies hot flashes, vaginal bleeding or vaginal discharge.   Denies joint pain, back pain or muscle pain/cramps. Denies itching, rash, or wounds. Denies dizziness, headaches, numbness or seizures. Denies swollen lymph nodes or glands, denies easy bruising or bleeding. Denies anxiety, depression, confusion, or decreased concentration.  Physical Exam:  Vital Signs for this encounter:  Blood pressure 131/86, pulse 98, temperature 98.6 F (37 C), temperature source Oral, resp. rate 18, height 5\' 3"  (1.6 m), weight 124 lb (56.2 kg), SpO2 100 %, unknown if currently breastfeeding. Body mass index is 21.97 kg/m. General: Alert, oriented, no acute distress.  HEENT: Normocephalic, atraumatic. Sclera anicteric.  Chest: Clear to auscultation bilaterally. No wheezes, rhonchi, or rales. Cardiovascular: Regular rate and rhythm, no murmurs, rubs, or gallops.  Abdomen: Normoactive bowel sounds. Soft, mildly distended, nontender to palpation. No masses or hepatosplenomegaly appreciated. No  palpable fluid wave.  Extremities: Grossly normal range of motion. Warm, well perfused. No edema bilaterally.  Skin: No rashes or lesions.  Lymphatics: No cervical, supraclavicular, or inguinal adenopathy.  GU:  Normal external female genitalia. No lesions. No discharge or bleeding.             Bladder/urethra:  No lesions or masses, well supported bladder             Vagina: Well rugated, no lesions.             Cervix: Normal appearing, no lesions.             Uterus: Small, minimally mobile, no parametrial involvement or nodularity.             Adnexa: Fullness noted within the cul-de-sac, minimally mobile, smooth.  Rectal: No nodularity.  LABORATORY AND RADIOLOGIC DATA:  Outside medical records were reviewed to synthesize the above history,  along with the history and physical obtained during the visit.   Lab Results  Component Value Date   WBC 5.9 05/29/2019   HGB 13.3 05/29/2019   HCT 40.8 05/29/2019   PLT 319 05/29/2019   GLUCOSE 227 (H) 05/29/2019   ALT 14 05/29/2019   AST 14 (L) 05/29/2019   NA 137 05/29/2019   K 4.5 05/29/2019   CL 109 05/29/2019   CREATININE 0.93 05/29/2019   BUN 18 05/29/2019   CO2 21 (L) 05/29/2019   TSH 0.926 02/28/2019   INR 1.3 (H) 05/26/2018   HGBA1C 9.8 (H) 12/05/2018   CA-125:  54.5  MRI pelvis 9/12: IMPRESSION: Cystic area in the cul-de-sac most suggestive of peritoneal inclusion cyst in the setting of endometriosis and adenomyosis. Gyn consultation is suggested for further evaluation. Suggest minimum short interval follow-up, initial follow-up at 3 months or less if there is concern based on elevated tumor markers or other clinical features that require more immediate assessment.   Exact ovarian position is difficult to assess but appears to be along the pelvic sidewall displaced by the cystic area in the cul-de-sac and associated with small endometriomas.   No evidence of leiomyoma in the uterus.  Pelvic ultrasound  6/30: IMPRESSION: 1. Cyst in the cul-de-sac with multiple thin septations is indeterminate. Surgical evaluation should be considered. This recommendation follows the consensus statement: Management of Asymptomatic Ovarian and Other Adnexal Cysts Imaged at [place]: Society of Radiologists in [place]. Radiology 2010; [phone]. 2. Mass in the uterus is most consistent with a leiomyoma.  CT A/P 6/24: IMPRESSION: 1. Septated 6.7 cm cystic left ovarian lesion. Because this lesion is not adequately characterized on CT, prompt [place] is recommended for further evaluation. Note: This recommendation does not apply to premenarchal patients and to those with increased risk (genetic, family history, elevated tumor markers or other high-risk factors) of ovarian cancer. Reference: JACR 2020 Feb; 17(2):248-254 2. Large volume of formed stool throughout the colon suggesting constipation. 3. Punctate nonobstructive right renal stone. 4. 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 5.  Aortic Atherosclerosis (ICD10-I70.0).

## 2020-11-04 NOTE — H&P (View-Only) (Signed)
GYNECOLOGIC ONCOLOGY NEW PATIENT CONSULTATION   Patient Name: Debbie Dorsey  Patient Age: 50 y.o. Date of Service: 11/05/20 Referring Provider: Mora Bellman, MD  Primary Care Provider: Prince Solian, MD Consulting Provider: Jeral Pinch, MD   Assessment/Plan:  Premenopausal patient with symptomatic pelvic mass.   I reviewed imaging findings and my exam with the patient and family present. There is a cystic mass within the cul de sac with rectum somewhat fixed on rectal exam. Difficult to determine if ovarian in origin on imaging. Given symptoms, I recommend surgical evaluation for diagnostic and therapeutic purposes.   We discussed that CA-125, which can be a tumor marker for ovarian cancer, is not a diagnostic test. It may be normal in up to 50% of early stage ovarian cancer and can be elevated in many noncancerous disease processes such as endometriosis. While the patient does not endorses symptoms that would suggest a history of endometriosis, this assessment is limited by her inability to give me detail with regards to her history.   Her grandfather is her healthcare POA. It is unclear to me whether he will need to sign surgical consent for her, so I have recommended that he come to her anesthesia appointment. I will plan to call him next week to review discussion today.   In the setting of diabetes, last A1c was 8.5%, we would ideally avoid open surgery. Repeat A1c will be obtained before surgery given increased cardiac and peri-operative risk with hyperglycemia.   We discussed plan today for robotic excision of mass, possible unilateral oophorectomy, bilateral salpingectomy, possible BSO, possible total hysterectomy, possible laparotomy, possible staging. Will plan to send mass for frozen section. If benign and does not involved either ovary, plan will be for removal of both tubes. If it involves one ovary, goal is to leave normal ovary in situ to allow for natural menopause. I  borderline tumor, then will plan on completion surgery with TRH/BSO. If malignancy, we discussed possible need for laparotomy, staging (including lymph node removal and omentectomy). Given somewhat fixed rectum on exam, the patient will do a bowel prep before surgery. We discussed possible risk of needing bowel resection that that goal would be reanastomosis. The patient voiced understanding that there is a risk of ostomy (temporary or permanent).   The risks of surgery were discussed in detail and she understands these to include infection; wound separation; hernia; vaginal cuff separation, injury to adjacent organs such as bowel, bladder, blood vessels, ureters and nerves; bleeding which may require blood transfusion; anesthesia risk; thromboembolic events; possible death; unforeseen complications; possible need for re-exploration; medical complications such as heart attack, stroke, pleural effusion and pneumonia; and, if full lymphadenectomy is performed the risk of lymphedema and lymphocyst. The patient will receive DVT and antibiotic prophylaxis as indicated. She voiced a clear understanding. She had the opportunity to ask questions. Perioperative instructions were reviewed with her. Prescriptions for post-op medications were sent to her pharmacy of choice.  A copy of this note was sent to the patient's referring provider.   70 minutes of total time was spent for this patient encounter, including preparation, face-to-face counseling with the patient and coordination of care, and documentation of the encounter.   Jeral Pinch, MD  Division of Gynecologic Oncology  Department of Obstetrics and Gynecology  [facility]  ___________________________________________  Chief Complaint: Chief Complaint  Patient presents with   Pelvic mass    History of Present Illness:  Debbie Dorsey is a 50 y.o.  y.o. female who is seen in consultation at the request of Dr. Elly Modena  for an evaluation of complex pelvic mass.  Patient is a rather poor historian.  She presents with her uncle and nephew today, who do not add much to her history.  Her healthcare power of attorney and caretaker is her grandfather who was not able to be here today secondary to having his own appointment.  Patient notes at least 6 months of pelvic pain which she describes as intermittent, happening most days, and fluctuating in nature.  It is usually sharp pain and she rates it at its highest at a 9 out of 10.  Activity increases her pain and it is sometimes associated with low back pain.  She takes Tylenol for her symptoms which helps to ease the pain.  She endorses a good appetite without nausea or emesis.  She notes normal bowel function although intermittently has pain in her low back when she defecates.  She denies any urinary symptoms.  She has a history of PE in 01/2019; repeat imaging in late 02/2019 showed no residual or recurrent VTE and her anticoagulation therapy was discontinued.  Medical history is also notable for diabetes with most recent hemoglobin A1c in July of 8.5%.  Patient lives in [place] grandfather.  She does not work.  She denies any tobacco or alcohol use.  PAST MEDICAL HISTORY:  Past Medical History:  Diagnosis Date   Diabetes mellitus without complication ([place])    History of pulmonary embolism    Schizophrenia ([place])      PAST SURGICAL HISTORY:  Past Surgical History:  Procedure Laterality Date   BREAST SURGERY      OB/GYN HISTORY:  OB History  Gravida Para Term Preterm AB Living  1 1 1  0 0 1  SAB IAB Ectopic Multiple Live Births  0 0 0 0 1    # Outcome Date GA Lbr Len/2nd Weight Sex Delivery Anes PTL Lv  1 Term             No LMP recorded.  Age at menarche: 89 Age at menopause: Not applicable, patient continues to have regular monthly menses Hx of HRT: Not applicable Hx of STDs: Denies Last pap: 09/2020 - [facility], HPV HR negative History of abnormal  pap smears: no  SCREENING STUDIES:  Last mammogram: 2022  Last colonoscopy: n/a  MEDICATIONS: Outpatient Encounter Medications as of 11/05/2020  Medication Sig   acetaminophen (TYLENOL) 325 MG tablet Take 650 mg by mouth every 6 (six) hours as needed for mild pain or headache.   albuterol (VENTOLIN HFA) 108 (90 Base) MCG/ACT inhaler Inhale 2 puffs into the lungs every 4 (four) hours as needed for shortness of breath or wheezing.   gabapentin (NEURONTIN) 300 MG capsule Take 1 capsule (300 mg total) by mouth 3 (three) times daily.   HUMALOG KWIKPEN 100 UNIT/ML KwikPen Inject into the skin with breakfast, with lunch, and with evening meal. Inject up to 25 units at meals 200-250: 1 unit 251-300: 2 units 301-350: 3 units >500: 5 units   LANTUS SOLOSTAR 100 UNIT/ML Solostar Pen SMARTSIG:30 Unit(s) SUB-Q Daily   mirtazapine (REMERON SOL-TAB) 15 MG disintegrating tablet Take 1 tablet (15 mg total) by mouth at bedtime.   Multiple Vitamin (MULTIVITAMIN WITH MINERALS) TABS tablet Take 1 tablet by mouth daily.   pantoprazole (PROTONIX) 40 MG tablet Take 40 mg by mouth daily.   perphenazine (TRILAFON) 4 MG tablet 1 a day 3 at hs   rosuvastatin (CRESTOR)  5 MG tablet Take 5 mg by mouth daily.   [DISCONTINUED] INGREZZA 40 MG capsule Take 40 mg by mouth daily.   [DISCONTINUED] insulin NPH-regular Human (70-30) 100 UNIT/ML injection Inject 32 Units into the skin 2 (two) times daily with a meal. (Patient not taking: Reported on 11/04/2020)   [DISCONTINUED] risperidone (RISPERDAL) 4 MG tablet Take by mouth.   [DISCONTINUED] Rivaroxaban 15 & 20 MG TBPK Take as directed on package: Start with one 15mg  tablet by mouth twice a day with food. On Day 22, switch to one 20mg  tablet once a day with food. (Patient not taking: No sig reported)   [DISCONTINUED] simvastatin (ZOCOR) 40 MG tablet Take 40 mg by mouth daily at 12 noon.  (Patient not taking: Reported on 11/04/2020)   [DISCONTINUED] traZODone (DESYREL) 50 MG  tablet Take 1 tablet (50 mg total) by mouth at bedtime as needed for sleep. (Patient not taking: Reported on 11/04/2020)   No facility-administered encounter medications on file as of 11/05/2020.    ALLERGIES:  No Known Allergies   FAMILY HISTORY:  Family History  Problem Relation Age of Onset   Breast cancer Mother    Prostate cancer Father    Mental illness Sister      SOCIAL HISTORY:  Social Connections: Not on file    REVIEW OF SYSTEMS:  Denies appetite changes, fevers, chills, fatigue, unexplained weight changes. Denies hearing loss, neck lumps or masses, mouth sores, ringing in ears or voice changes. Denies cough or wheezing.  Denies shortness of breath. Denies chest pain or palpitations. Denies leg swelling. Denies abdominal distention, pain, blood in stools, constipation, diarrhea, nausea, vomiting, or early satiety. Denies pain with intercourse, dysuria, frequency, hematuria or incontinence. Denies hot flashes, vaginal bleeding or vaginal discharge.   Denies joint pain, back pain or muscle pain/cramps. Denies itching, rash, or wounds. Denies dizziness, headaches, numbness or seizures. Denies swollen lymph nodes or glands, denies easy bruising or bleeding. Denies anxiety, depression, confusion, or decreased concentration.  Physical Exam:  Vital Signs for this encounter:  Blood pressure 131/86, pulse 98, temperature 98.6 F (37 C), temperature source Oral, resp. rate 18, height 5\' 3"  (1.6 m), weight 124 lb (56.2 kg), SpO2 100 %, unknown if currently breastfeeding. Body mass index is 21.97 kg/m. General: Alert, oriented, no acute distress.  HEENT: Normocephalic, atraumatic. Sclera anicteric.  Chest: Clear to auscultation bilaterally. No wheezes, rhonchi, or rales. Cardiovascular: Regular rate and rhythm, no murmurs, rubs, or gallops.  Abdomen: Normoactive bowel sounds. Soft, mildly distended, nontender to palpation. No masses or hepatosplenomegaly appreciated. No  palpable fluid wave.  Extremities: Grossly normal range of motion. Warm, well perfused. No edema bilaterally.  Skin: No rashes or lesions.  Lymphatics: No cervical, supraclavicular, or inguinal adenopathy.  GU:  Normal external female genitalia. No lesions. No discharge or bleeding.             Bladder/urethra:  No lesions or masses, well supported bladder             Vagina: Well rugated, no lesions.             Cervix: Normal appearing, no lesions.             Uterus: Small, minimally mobile, no parametrial involvement or nodularity.             Adnexa: Fullness noted within the cul-de-sac, minimally mobile, smooth.  Rectal: No nodularity.  LABORATORY AND RADIOLOGIC DATA:  Outside medical records were reviewed to synthesize the above history,  along with the history and physical obtained during the visit.   Lab Results  Component Value Date   WBC 5.9 05/29/2019   HGB 13.3 05/29/2019   HCT 40.8 05/29/2019   PLT 319 05/29/2019   GLUCOSE 227 (H) 05/29/2019   ALT 14 05/29/2019   AST 14 (L) 05/29/2019   NA 137 05/29/2019   K 4.5 05/29/2019   CL 109 05/29/2019   CREATININE 0.93 05/29/2019   BUN 18 05/29/2019   CO2 21 (L) 05/29/2019   TSH 0.926 02/28/2019   INR 1.3 (H) 05/26/2018   HGBA1C 9.8 (H) 12/05/2018   CA-125:  54.5  MRI pelvis 9/12: IMPRESSION: Cystic area in the cul-de-sac most suggestive of peritoneal inclusion cyst in the setting of endometriosis and adenomyosis. Gyn consultation is suggested for further evaluation. Suggest minimum short interval follow-up, initial follow-up at 3 months or less if there is concern based on elevated tumor markers or other clinical features that require more immediate assessment.   Exact ovarian position is difficult to assess but appears to be along the pelvic sidewall displaced by the cystic area in the cul-de-sac and associated with small endometriomas.   No evidence of leiomyoma in the uterus.  Pelvic ultrasound  6/30: IMPRESSION: 1. Cyst in the cul-de-sac with multiple thin septations is indeterminate. Surgical evaluation should be considered. This recommendation follows the consensus statement: Management of Asymptomatic Ovarian and Other Adnexal Cysts Imaged at [place]: Society of Radiologists in [place]. Radiology 2010; [id number]. 2. Mass in the uterus is most consistent with a leiomyoma.  CT A/P 6/24: IMPRESSION: 1. Septated 6.7 cm cystic left ovarian lesion. Because this lesion is not adequately characterized on CT, prompt [place] is recommended for further evaluation. Note: This recommendation does not apply to premenarchal patients and to those with increased risk (genetic, family history, elevated tumor markers or other high-risk factors) of ovarian cancer. Reference: JACR 2020 Feb; 17(2):248-254 2. Large volume of formed stool throughout the colon suggesting constipation. 3. Punctate nonobstructive right renal stone. 4. 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 5.  Aortic Atherosclerosis (ICD10-I70.0).

## 2020-11-05 ENCOUNTER — Inpatient Hospital Stay: Payer: Medicare Other | Attending: Gynecologic Oncology | Admitting: Gynecologic Oncology

## 2020-11-05 ENCOUNTER — Other Ambulatory Visit: Payer: Self-pay

## 2020-11-05 ENCOUNTER — Encounter: Payer: Self-pay | Admitting: Gynecologic Oncology

## 2020-11-05 ENCOUNTER — Inpatient Hospital Stay (HOSPITAL_BASED_OUTPATIENT_CLINIC_OR_DEPARTMENT_OTHER): Payer: Medicare Other | Admitting: Gynecologic Oncology

## 2020-11-05 VITALS — BP 131/86 | HR 98 | Temp 98.6°F | Resp 18 | Ht 63.0 in | Wt 124.0 lb

## 2020-11-05 DIAGNOSIS — F209 Schizophrenia, unspecified: Secondary | ICD-10-CM

## 2020-11-05 DIAGNOSIS — R1900 Intra-abdominal and pelvic swelling, mass and lump, unspecified site: Secondary | ICD-10-CM

## 2020-11-05 DIAGNOSIS — Z79899 Other long term (current) drug therapy: Secondary | ICD-10-CM | POA: Diagnosis not present

## 2020-11-05 DIAGNOSIS — E119 Type 2 diabetes mellitus without complications: Secondary | ICD-10-CM

## 2020-11-05 DIAGNOSIS — F200 Paranoid schizophrenia: Secondary | ICD-10-CM

## 2020-11-05 DIAGNOSIS — R102 Pelvic and perineal pain: Secondary | ICD-10-CM | POA: Insufficient documentation

## 2020-11-05 DIAGNOSIS — Z86711 Personal history of pulmonary embolism: Secondary | ICD-10-CM | POA: Diagnosis not present

## 2020-11-05 DIAGNOSIS — E1165 Type 2 diabetes mellitus with hyperglycemia: Secondary | ICD-10-CM

## 2020-11-05 DIAGNOSIS — Z794 Long term (current) use of insulin: Secondary | ICD-10-CM | POA: Diagnosis not present

## 2020-11-05 NOTE — Progress Notes (Signed)
Patient here with her uncle and nephew for new patient consultation and for a pre-operative discussion prior to her scheduled surgery on November 23, 2020. She is scheduled for robotic assisted laparoscopic pelvic mass resection, possible unilateral vs bilateral salpingo-oophorectomy, possible total laparoscopic hysterectomy, possible staging if a cancer is identified, possible laparotomy.  The surgery was discussed in detail.  See after visit summary for additional details.   Discussed measures to take at home to prevent DVT including frequent mobility.  Reportable signs and symptoms of DVT discussed. Post-operative instructions discussed and expectations for after surgery. Incisional care discussed as well including reportable signs and symptoms including erythema, drainage, wound separation.     10 minutes spent with the patient.  Verbalizing understanding of material discussed. No needs or concerns voiced at the end of the visit.   Advised patient and family to call for any needs.   This appointment will be repeated closer to surgery when her grandfather who is her legal guardian can be present. We will plan to discuss the bowel prep in detail and will obtain consent for a hysterectomy form at that visit.   This appointment is included in the global surgical bundle as pre-operative teaching and has no charge.

## 2020-11-05 NOTE — Patient Instructions (Signed)
Preparing for your Surgery   Plan for surgery on November 23, 2020 with Dr. Jeral Pinch at [facility] will be scheduled for robotic assisted laparoscopic pelvic mass resection, possible unilateral vs bilateral salpingo-oophorectomy (removal of one or both ovaries and fallopian tubes), possible total laparoscopic hysterectomy (removal of the uterus and cervix), possible staging if a cancer is identified, possible laparotomy (larger incision on your abdomen if needed).    Pre-operative Testing -You will receive a phone call from presurgical testing at [facility] to arrange for a pre-operative appointment and lab work.   -Bring your insurance card, copy of an advanced directive if applicable, medication list   -At that visit, you will be asked to sign a consent for a possible blood transfusion in case a transfusion becomes necessary during surgery.  The need for a blood transfusion is rare but having consent is a necessary part of your care.      -You should not be taking blood thinners or aspirin at least ten days prior to surgery unless instructed by your surgeon.   -Do not take supplements such as fish oil (omega 3), red yeast rice, turmeric before your surgery. You want to avoid medications with aspirin in them including headache powders such as BC or Goody's), Excedrin migraine.   Day Before Surgery at [facility]   If your bowels are filled with gas, your surgeon will have difficulty visualizing your pelvic organs which increases your surgical risks.   Your role in recovery We will plan to send you home the same day of surgery once you have recovered.    Your role is to become active as soon as directed by your doctor, while still giving yourself time to heal.  Rest when you feel tired. You will be asked to do the following in order to speed your recovery:   - Cough and breathe deeply. This helps to clear and expand your lungs and can  prevent pneumonia after surgery.  - [place]. Do mild physical activity. Walking or moving your legs help your circulation and body functions return to normal. Do not try to get up or walk alone the first time after surgery.   -If you develop swelling on one leg or the other, pain in the back of your leg, redness/warmth in one of your legs, please call the office or go to the Emergency Room to have a doppler to rule out a blood clot. For shortness of breath, chest pain-seek care in the Emergency Room as soon as possible. - Actively manage your pain. Managing your pain lets you move in comfort. We will ask you to rate your pain on a scale of zero to 10. It is your responsibility to tell your doctor or nurse where and how much you hurt so your pain can be treated.   Special Considerations -If you are diabetic, you may be placed on insulin after surgery to have closer control over your blood sugars to promote healing and recovery.  This does not mean that you will be discharged on insulin.  If applicable, your oral antidiabetics will be resumed when you are tolerating a solid diet.   -Your final pathology results from surgery should be available around one week after surgery and the results will be relayed to you when available.   -Dr. Lahoma Crocker is the surgeon that assists your GYN Oncologist with surgery.  If you end up staying the night,  the next day after your surgery you will either see Dr. Denman George, Dr. Berline Lopes, or Dr. Lahoma Crocker.   -FMLA forms can be faxed to [phone] and please allow 5-7 business days for completion.   Risks of Surgery Risks of surgery are low but include bleeding, infection, damage to surrounding structures, re-operation, blood clots, and very rarely death.     Blood Transfusion Information (For the consent to be signed before surgery)   We will be checking your blood type before surgery so in case of emergencies, we will know what type  of blood you would need.                                             WHAT IS A BLOOD TRANSFUSION?   A transfusion is the replacement of blood or some of its parts. Blood is made up of multiple cells which provide different functions. Red blood cells carry oxygen and are used for blood loss replacement. White blood cells fight against infection. Platelets control bleeding. Plasma helps clot blood. Other blood products are available for specialized needs, such as hemophilia or other clotting disorders. BEFORE THE TRANSFUSION  Who gives blood for transfusions?  You may be able to donate blood to be used at a later date on yourself (autologous donation). Relatives can be asked to donate blood. This is generally not any safer than if you have received blood from a stranger. The same precautions are taken to ensure safety when a relative's blood is donated. Healthy volunteers who are fully evaluated to make sure their blood is safe. This is blood bank blood. Transfusion therapy is the safest it has ever been in the practice of medicine. Before blood is taken from a donor, a complete history is taken to make sure that person has no history of diseases nor engages in risky social behavior (examples are intravenous drug use or sexual activity with multiple partners). The donor's travel history is screened to minimize risk of transmitting infections, such as malaria. The donated blood is tested for signs of infectious diseases, such as HIV and hepatitis. The blood is then tested to be sure it is compatible with you in order to minimize the chance of a transfusion reaction. If you or a relative donates blood, this is often done in anticipation of surgery and is not appropriate for emergency situations. It takes many days to process the donated blood. RISKS AND COMPLICATIONS Although transfusion therapy is very safe and saves many lives, the main dangers of transfusion include:  Getting an infectious  disease. Developing a transfusion reaction. This is an allergic reaction to something in the blood you were given. Every precaution is taken to prevent this. The decision to have a blood transfusion has been considered carefully by your caregiver before blood is given. Blood is not given unless the benefits outweigh the risks.   AFTER SURGERY INSTRUCTIONS   Return to work: 4-6 weeks if applicable   Activity: 1. Be up and out of the bed during the day.  Take a nap if needed.  You may walk up steps but be careful and use the hand rail.  Stair climbing will tire you more than you think, you may need to stop part way and rest.    2. No lifting or straining for 6 weeks over 10 pounds. No pushing, pulling, straining for 6 weeks.  3. No driving for 1 week if you are cleared to drive before surgery.  Do not drive if you are taking narcotic pain medicine and make sure that your reaction time has returned.    4. You can shower as soon as the next day after surgery. Shower daily.  Use your regular soap and water (not directly on the incision) and pat your incision(s) dry afterwards; don't rub.  No tub baths or submerging your body in water until cleared by your surgeon. If you have the soap that was given to you by pre-surgical testing that was used before surgery, you do not need to use it afterwards because this can irritate your incisions.    5. No sexual activity and nothing in the vagina for 4 weeks. Nothing in the vagina for 8 weeks if you have a hysterectomy.   6. You may experience a small amount of clear drainage from your incisions, which is normal.  If the drainage persists, increases, or changes color please call the office.   7. Do not use creams, lotions, or ointments such as neosporin on your incisions after surgery until advised by your surgeon because they can cause removal of the dermabond glue on your incisions.     8. You may experience vaginal spotting after surgery or around the  6-8 week mark from surgery when the stitches at the top of the vagina begin to dissolve.  The spotting is normal but if you experience heavy bleeding, call our office.   9. Take Tylenol or ibuprofen first for pain and only use narcotic pain medication for severe pain not relieved by the Tylenol or Ibuprofen.  Monitor your Tylenol intake to a max of 4,000 mg in a 24 hour period. You can alternate these medications after surgery.   Diet: 1. Low sodium Heart Healthy Diet is recommended but you are cleared to resume your normal (before surgery) diet after your procedure.   2. It is safe to use a laxative, such as Miralax or Colace, if you have difficulty moving your bowels. You have been prescribed Sennakot at bedtime every evening to keep bowel movements regular and to prevent constipation.     Wound Care: 1. Keep clean and dry.  Shower daily.   Reasons to call the Doctor: Fever - Oral temperature greater than 100.4 degrees Fahrenheit Foul-smelling vaginal discharge Difficulty urinating Nausea and vomiting Increased pain at the site of the incision that is unrelieved with pain medicine. Difficulty breathing with or without chest pain New calf pain especially if only on one side Sudden, continuing increased vaginal bleeding with or without clots.   Contacts: For questions or concerns you should contact:   Dr. Jeral Pinch at [phone]   Joylene John, NP at [phone]   After Hours: call [phone] and have the GYN Oncologist paged/contacted (after 5 pm or on the weekends).   Messages sent via mychart are for non-urgent matters and are not responded to after hours so for urgent needs, please call the after hours number.        COLON BOWEL PREP     FIVE DAYS PRIOR TO YOUR SURGERY   Obtain supplies for the bowel prep at a pharmacy of your choice: Office-e-mailed prescriptions for your antibiotic pills (Neomycin & Erythromycin)  1 jug of Golytely or Miralax (we will  update you on which medication)      Change your diet to make the bowel prep go more easily: Switch to a bland, low fiber diet  Stop eating any nuts, popcorn, or fruit with seeds.  Stop all fiber supplements such as Metamucil, Miralax, etc.     Improve nutrition: Consider drinking 2-3 nutritional shakes (Ex: Ensure Surgery) every day, starting 5 days prior to surgery     DAY PRIOR TO SURGERY    Switch to a full liquid diet the day before surgery Drink plenty of liquids all day to avoid getting dehydrated      12:00pm Drink the liquid bowel prep (either Golytely or Miralax).   (You should finish in 2 hours)   2:00pm Take 2 Neomycin 500mg  tablets & 2 Erythromycin 500mg  tablets   3:00pm Take 2 Neomycin 500mg  tablets & 2 Erythromycin 500mg  tablets  Drink plenty of clear liquids all evening to avoid getting dehydrated   10:00pm Take 2 Neomycin 500mg  tablets & 2 Erythromycin 500mg  tablets  Drink 2 Carbohydrate loading nutrition drinks (ex: Ensure Presurgery). These will be given at pre-op appointment. Do not eat anything solid after bedtime (midnight) the night before your surgery.   BUT DO drink plenty of clear liquids (Water, Gatorade, juice, soda, coffee, tea, broths, etc.) up to 3 hours prior to surgery to avoid getting dehydrated.      MORNING OF SURGERY   Remember to not to eat anything solid that morning  Drink one final carbohydrate loading nutritional drink (ex: Ensure Presurgery) upon waking up in the morning (needs to be 3 hours before your surgery). Hold or take medications as recommended by the hospital staff at your Preoperative visit Stop drinking liquids before you leave the house (>3 hours prior to surgery)       If you have questions or concerns, please call GYN Oncology Office at [phone] during business hours to speak to the clinical staff for advice.       WHAT DO I NEED TO KNOW ABOUT A FULL LIQUID DIET? -You may have any liquid. -You may have  any food that becomes a liquid at room temperature. The food is considered a liquid if it can be poured off a spoon at room temperature. WHAT FOODS CAN I EAT? -Grains: Any grain food that can be pureed in soup (such as crackers, pasta, and rice). Hot cereal (such as farina or oatmeal) that has been blended.  -Fruits: Fruit juice, including nectars and juices. -Meats and Other Protein Sources: Eggs in custard, eggnog mix, and eggs used in ice cream or pudding. Strained meats, like in baby food, may be allowed. Consult your health care provider.  -Dairy: Milk and milk-based beverages, including milk shakes and instant breakfast mixes. Smooth yogurt. Pureed cottage cheese. Avoid these foods if they are not well tolerated. -Beverages: All beverages, including liquid nutritional supplements. AVOID CARBONATED BEVERAGES. -Condiments: Iodized salt, pepper, spices, and flavorings. Cocoa powder. Vinegar, ketchup, yellow mustard, smooth sauces (such as hollandaise, cheese sauce, or white sauce), and soy sauce. -Sweets and Desserts: Custard, smooth pudding. Flavored gelatin. Tapioca, junket. Plain ice cream, sherbet, fruit ices. Frozen ice pops, frozen fudge pops, pudding pops, and other frozen bars with cream. Syrups, including chocolate syrup. Sugar, honey, jelly.  -Fats and Oils: Margarine, butter, cream, sour cream, and oils. -Other: Broth and cream soups. Strained, broth-based soups. The items listed above may not be a complete list of recommended foods or beverages.  WHAT FOODS CAN I NOT EAT? Grains: All breads. Grains are not allowed unless they are pureed into soup. Vegetables: No raw vegetables. Vegetables are not allowed unless they are juiced, or cooked and pureed  into soup. Fruits: Fruits are not allowed unless they are juiced. Meats and Other Protein Sources: Any meat or fish. Cooked or raw eggs. Nut butters.  Dairy: Cheese.  Condiments: Stone ground mustards. Fats and Oils: Fats that are coarse  or chunky. Sweets and Desserts: Ice cream or other frozen desserts that have any solids in them or on top, such as nuts, chocolate chips, and pieces of cookies. Cakes. Cookies. Candy. Others: Soups with chunks or pieces in them.

## 2020-11-05 NOTE — Patient Instructions (Addendum)
Preparing for your Surgery  Plan for surgery on November 23, 2020 with Dr. Jeral Pinch at [facility] will be scheduled for robotic assisted laparoscopic pelvic mass resection, possible unilateral vs bilateral salpingo-oophorectomy (removal of one or both ovaries and fallopian tubes), possible total laparoscopic hysterectomy (removal of the uterus and cervix), possible staging if a cancer is identified, possible laparotomy (larger incision on your abdomen if needed).   Pre-operative Testing -You will receive a phone call from presurgical testing at [facility] to arrange for a pre-operative appointment and lab work.  -Bring your insurance card, copy of an advanced directive if applicable, medication list  -At that visit, you will be asked to sign a consent for a possible blood transfusion in case a transfusion becomes necessary during surgery.  The need for a blood transfusion is rare but having consent is a necessary part of your care.     -You should not be taking blood thinners or aspirin at least ten days prior to surgery unless instructed by your surgeon.  -Do not take supplements such as fish oil (omega 3), red yeast rice, turmeric before your surgery. You want to avoid medications with aspirin in them including headache powders such as BC or Goody's), Excedrin migraine.  Day Before Surgery at [facility]  If your bowels are filled with gas, your surgeon will have difficulty visualizing your pelvic organs which increases your surgical risks.  Your role in recovery We will plan to send you home the same day of surgery once you have recovered.   Your role is to become active as soon as directed by your doctor, while still giving yourself time to heal.  Rest when you feel tired. You will be asked to do the following in order to speed your recovery:  - Cough and breathe deeply. This helps to clear and expand your lungs and can prevent  pneumonia after surgery.  - [place]. Do mild physical activity. Walking or moving your legs help your circulation and body functions return to normal. Do not try to get up or walk alone the first time after surgery.   -If you develop swelling on one leg or the other, pain in the back of your leg, redness/warmth in one of your legs, please call the office or go to the Emergency Room to have a doppler to rule out a blood clot. For shortness of breath, chest pain-seek care in the Emergency Room as soon as possible. - Actively manage your pain. Managing your pain lets you move in comfort. We will ask you to rate your pain on a scale of zero to 10. It is your responsibility to tell your doctor or nurse where and how much you hurt so your pain can be treated.  Special Considerations -If you are diabetic, you may be placed on insulin after surgery to have closer control over your blood sugars to promote healing and recovery.  This does not mean that you will be discharged on insulin.  If applicable, your oral antidiabetics will be resumed when you are tolerating a solid diet.  -Your final pathology results from surgery should be available around one week after surgery and the results will be relayed to you when available.  -Dr. Lahoma Crocker is the surgeon that assists your GYN Oncologist with surgery.  If you end up staying the night, the next day after your surgery you will either see Dr. Denman George, Dr. Berline Lopes,  or Dr. Lahoma Crocker.  -FMLA forms can be faxed to [phone] and please allow 5-7 business days for completion.  Risks of Surgery Risks of surgery are low but include bleeding, infection, damage to surrounding structures, re-operation, blood clots, and very rarely death.   Blood Transfusion Information (For the consent to be signed before surgery)  We will be checking your blood type before surgery so in case of emergencies, we will know what type of blood you  would need.                                            WHAT IS A BLOOD TRANSFUSION?  A transfusion is the replacement of blood or some of its parts. Blood is made up of multiple cells which provide different functions. Red blood cells carry oxygen and are used for blood loss replacement. White blood cells fight against infection. Platelets control bleeding. Plasma helps clot blood. Other blood products are available for specialized needs, such as hemophilia or other clotting disorders. BEFORE THE TRANSFUSION  Who gives blood for transfusions?  You may be able to donate blood to be used at a later date on yourself (autologous donation). Relatives can be asked to donate blood. This is generally not any safer than if you have received blood from a stranger. The same precautions are taken to ensure safety when a relative's blood is donated. Healthy volunteers who are fully evaluated to make sure their blood is safe. This is blood bank blood. Transfusion therapy is the safest it has ever been in the practice of medicine. Before blood is taken from a donor, a complete history is taken to make sure that person has no history of diseases nor engages in risky social behavior (examples are intravenous drug use or sexual activity with multiple partners). The donor's travel history is screened to minimize risk of transmitting infections, such as malaria. The donated blood is tested for signs of infectious diseases, such as HIV and hepatitis. The blood is then tested to be sure it is compatible with you in order to minimize the chance of a transfusion reaction. If you or a relative donates blood, this is often done in anticipation of surgery and is not appropriate for emergency situations. It takes many days to process the donated blood. RISKS AND COMPLICATIONS Although transfusion therapy is very safe and saves many lives, the main dangers of transfusion include:  Getting an infectious disease. Developing a  transfusion reaction. This is an allergic reaction to something in the blood you were given. Every precaution is taken to prevent this. The decision to have a blood transfusion has been considered carefully by your caregiver before blood is given. Blood is not given unless the benefits outweigh the risks.  AFTER SURGERY INSTRUCTIONS  Return to work: 4-6 weeks if applicable  Activity: 1. Be up and out of the bed during the day.  Take a nap if needed.  You may walk up steps but be careful and use the hand rail.  Stair climbing will tire you more than you think, you may need to stop part way and rest.   2. No lifting or straining for 6 weeks over 10 pounds. No pushing, pulling, straining for 6 weeks.  3. No driving for 1 week if you are cleared to drive before surgery.  Do not drive if you are taking narcotic pain  medicine and make sure that your reaction time has returned.   4. You can shower as soon as the next day after surgery. Shower daily.  Use your regular soap and water (not directly on the incision) and pat your incision(s) dry afterwards; don't rub.  No tub baths or submerging your body in water until cleared by your surgeon. If you have the soap that was given to you by pre-surgical testing that was used before surgery, you do not need to use it afterwards because this can irritate your incisions.   5. No sexual activity and nothing in the vagina for 4 weeks. Nothing in the vagina for 8 weeks if you have a hysterectomy.  6. You may experience a small amount of clear drainage from your incisions, which is normal.  If the drainage persists, increases, or changes color please call the office.  7. Do not use creams, lotions, or ointments such as neosporin on your incisions after surgery until advised by your surgeon because they can cause removal of the dermabond glue on your incisions.    8. You may experience vaginal spotting after surgery or around the 6-8 week mark from surgery when the  stitches at the top of the vagina begin to dissolve.  The spotting is normal but if you experience heavy bleeding, call our office.  9. Take Tylenol or ibuprofen first for pain and only use narcotic pain medication for severe pain not relieved by the Tylenol or Ibuprofen.  Monitor your Tylenol intake to a max of 4,000 mg in a 24 hour period. You can alternate these medications after surgery.  Diet: 1. Low sodium Heart Healthy Diet is recommended but you are cleared to resume your normal (before surgery) diet after your procedure.  2. It is safe to use a laxative, such as Miralax or Colace, if you have difficulty moving your bowels. You have been prescribed Sennakot at bedtime every evening to keep bowel movements regular and to prevent constipation.    Wound Care: 1. Keep clean and dry.  Shower daily.  Reasons to call the Doctor: Fever - Oral temperature greater than 100.4 degrees Fahrenheit Foul-smelling vaginal discharge Difficulty urinating Nausea and vomiting Increased pain at the site of the incision that is unrelieved with pain medicine. Difficulty breathing with or without chest pain New calf pain especially if only on one side Sudden, continuing increased vaginal bleeding with or without clots.   Contacts: For questions or concerns you should contact:  Dr. Jeral Pinch at [phone]  Joylene John, NP at [phone]  After Hours: call [phone] and have the GYN Oncologist paged/contacted (after 5 pm or on the weekends).  Messages sent via mychart are for non-urgent matters and are not responded to after hours so for urgent needs, please call the after hours number.     COLON BOWEL PREP   FIVE DAYS PRIOR TO YOUR SURGERY  Obtain supplies for the bowel prep at a pharmacy of your choice: Office-e-mailed prescriptions for your antibiotic pills (Neomycin & Erythromycin)  1 jug of Golytely or Miralax (we will update you on which medication)    Change your diet  to make the bowel prep go more easily: Switch to a bland, low fiber diet Stop eating any nuts, popcorn, or fruit with seeds.  Stop all fiber supplements such as Metamucil, Miralax, etc.    Improve nutrition: Consider drinking 2-3 nutritional shakes (Ex: Ensure Surgery) every day, starting 5 days prior to surgery   DAY PRIOR TO SURGERY  Switch to a full liquid diet the day before surgery Drink plenty of liquids all day to avoid getting dehydrated    12:00pm Drink the liquid bowel prep (either Golytely or Miralax).   (You should finish in 2 hours)  2:00pm Take 2 Neomycin 500mg  tablets & 2 Erythromycin 500mg  tablets  3:00pm Take 2 Neomycin 500mg  tablets & 2 Erythromycin 500mg  tablets  Drink plenty of clear liquids all evening to avoid getting dehydrated  10:00pm Take 2 Neomycin 500mg  tablets & 2 Erythromycin 500mg  tablets  Drink 2 Carbohydrate loading nutrition drinks (ex: Ensure Presurgery). These will be given at pre-op appointment. Do not eat anything solid after bedtime (midnight) the night before your surgery.   BUT DO drink plenty of clear liquids (Water, Gatorade, juice, soda, coffee, tea, broths, etc.) up to 3 hours prior to surgery to avoid getting dehydrated.    MORNING OF SURGERY  Remember to not to eat anything solid that morning  Drink one final carbohydrate loading nutritional drink (ex: Ensure Presurgery) upon waking up in the morning (needs to be 3 hours before your surgery). Hold or take medications as recommended by the hospital staff at your Preoperative visit Stop drinking liquids before you leave the house (>3 hours prior to surgery)    If you have questions or concerns, please call GYN Oncology Office at [phone] during business hours to speak to the clinical staff for advice.    WHAT DO I NEED TO KNOW ABOUT A FULL LIQUID DIET? -You may have any liquid. -You may have any food that becomes a liquid at room temperature. The food is considered a  liquid if it can be poured off a spoon at room temperature. WHAT FOODS CAN I EAT? -Grains: Any grain food that can be pureed in soup (such as crackers, pasta, and rice). Hot cereal (such as farina or oatmeal) that has been blended.  -Fruits: Fruit juice, including nectars and juices. -Meats and Other Protein Sources: Eggs in custard, eggnog mix, and eggs used in ice cream or pudding. Strained meats, like in baby food, may be allowed. Consult your health care provider.  -Dairy: Milk and milk-based beverages, including milk shakes and instant breakfast mixes. Smooth yogurt. Pureed cottage cheese. Avoid these foods if they are not well tolerated. -Beverages: All beverages, including liquid nutritional supplements. AVOID CARBONATED BEVERAGES. -Condiments: Iodized salt, pepper, spices, and flavorings. Cocoa powder. Vinegar, ketchup, yellow mustard, smooth sauces (such as hollandaise, cheese sauce, or white sauce), and soy sauce. -Sweets and Desserts: Custard, smooth pudding. Flavored gelatin. Tapioca, junket. Plain ice cream, sherbet, fruit ices. Frozen ice pops, frozen fudge pops, pudding pops, and other frozen bars with cream. Syrups, including chocolate syrup. Sugar, honey, jelly.  -Fats and Oils: Margarine, butter, cream, sour cream, and oils. -Other: Broth and cream soups. Strained, broth-based soups. The items listed above may not be a complete list of recommended foods or beverages.  WHAT FOODS CAN I NOT EAT? Grains: All breads. Grains are not allowed unless they are pureed into soup. Vegetables: No raw vegetables. Vegetables are not allowed unless they are juiced, or cooked and pureed into soup. Fruits: Fruits are not allowed unless they are juiced. Meats and Other Protein Sources: Any meat or fish. Cooked or raw eggs. Nut butters.  Dairy: Cheese.  Condiments: Stone ground mustards. Fats and Oils: Fats that are coarse or chunky. Sweets and Desserts: Ice cream or other frozen desserts that have  any solids in them or on top, such as nuts, chocolate  chips, and pieces of cookies. Cakes. Cookies. Candy. Others: Soups with chunks or pieces in them.

## 2020-11-09 ENCOUNTER — Telehealth: Payer: Self-pay | Admitting: Gynecologic Oncology

## 2020-11-09 ENCOUNTER — Telehealth: Payer: Self-pay | Admitting: *Deleted

## 2020-11-09 NOTE — Telephone Encounter (Signed)
Called the patient's grandfather (healthcare POA, signs consent for her). Discussed diagnosis of of pelvic mass, what has been seen on recent imaging, and my recommendations for surgery. All questions answered. He is aware he will physically need to be at anesthesia appointment to sign consent for her.  Jeral Pinch MD Gynecologic Oncology

## 2020-11-09 NOTE — Telephone Encounter (Signed)
Fax records and surgical optimization form to PCP

## 2020-11-16 NOTE — Patient Instructions (Addendum)
DUE TO COVID-19 ONLY ONE VISITOR IS ALLOWED TO COME WITH YOU AND STAY IN THE WAITING ROOM ONLY DURING PRE OP AND PROCEDURE.   **NO VISITORS ARE ALLOWED IN THE SHORT STAY AREA OR RECOVERY ROOM!!**       Your procedure is scheduled on: 11/23/20   Report to [facility] Main Entrance    Report to admitting at 12:30 PM   Call this number if you have problems the morning of surgery [phone]   Follow bowel prep and diet instructions given to you by surgeons office   May have liquids until 11:45 AM day of surgery  CLEAR LIQUID DIET  Foods Allowed                                                                     Foods Excluded  Water, Black Coffee and tea (no milk or creamer)          liquids that you cannot  Plain Jell-O in any flavor  (No red)                                   see through such as: Fruit ices (not with fruit pulp)                                           milk, soups, orange juice              Iced Popsicles (No red)                                               All solid food                                   Apple juices Sports drinks like Gatorade (No red) Lightly seasoned clear broth or consume(fat free) Sugar   Drink 2 G2 drinks the night before surgery by 10pm.  Complete one G2 drink the morning of surgery 3 hours prior to scheduled surgery at 11:45 AM.     The day of surgery:  Drink ONE (1) Pre-Surgery G2 by 11:45 am the morning of surgery. Drink in one sitting. Do not sip.  This drink was given to you during your hospital  pre-op appointment visit. Nothing else to drink after completing the  Pre-Surgery G2.          If you have questions, please contact your surgeon's office.     Oral Hygiene is also important to reduce your risk of infection.                                    Remember - BRUSH YOUR TEETH THE MORNING OF SURGERY WITH YOUR REGULAR TOOTHPASTE   Take these medicines the morning of surgery with A SIP OF WATER: Tylenol,  Inhalers,  Gabapentin, Protonix, Trilafon, Crestor  DO NOT TAKE ANY ORAL DIABETIC MEDICATIONS DAY OF YOUR SURGERY  How to Manage Your Diabetes Before and After Surgery  Why is it important to control my blood sugar before and after surgery? Improving blood sugar levels before and after surgery helps healing and can limit problems. A way of improving blood sugar control is eating a healthy diet by:  Eating less sugar and carbohydrates  Increasing activity/exercise  Talking with your doctor about reaching your blood sugar goals High blood sugars (greater than 180 mg/dL) can raise your risk of infections and slow your recovery, so you will need to focus on controlling your diabetes during the weeks before surgery. Make sure that the doctor who takes care of your diabetes knows about your planned surgery including the date and location.  How do I manage my blood sugar before surgery? Check your blood sugar at least 4 times a day, starting 2 days before surgery, to make sure that the level is not too high or low. Check your blood sugar the morning of your surgery when you wake up and every 2 hours until you get to the Short Stay unit. If your blood sugar is less than 70 mg/dL, you will need to treat for low blood sugar: Do not take insulin. Treat a low blood sugar (less than 70 mg/dL) with  cup of clear juice (cranberry or apple), 4 glucose tablets, OR glucose gel. Recheck blood sugar in 15 minutes after treatment (to make sure it is greater than 70 mg/dL). If your blood sugar is not greater than 70 mg/dL on recheck, call [phone] for further instructions. Report your blood sugar to the short stay nurse when you get to Short Stay.  If you are admitted to the hospital after surgery: Your blood sugar will be checked by the staff and you will probably be given insulin after surgery (instead of oral diabetes medicines) to make sure you have good blood sugar levels. The goal for blood sugar control after  surgery is 80-180 mg/dL.   WHAT DO I DO ABOUT MY DIABETES MEDICATION?  Do not take oral diabetes medicines (pills) the morning of surgery.  THE DAY BEFORE SURGERY, take Humalog as prescribed before meals, do not take a bedtime dose. Take Lantus as prescribed.       THE MORNING OF SURGERY, Do not take Humalog unless blood sugar is greater than 220, then talk half of normal dose. Take 50% of Lantus (7 units)  If your CBG is greater than 220 mg/dL, you may take  of your sliding scale  (correction) dose of insulin.     Reviewed and Endorsed by [facility] Patient Education Committee, August 2015                               You may not have any metal on your body including hair pins, jewelry, and body piercing             Do not wear make-up, lotions, powders, perfumes, or deodorant  Do not wear nail polish including gel and S&S, artificial/acrylic nails, or any other type of covering on natural nails including finger and toenails. If you have artificial nails, gel coating, etc. that needs to be removed by a nail salon please have this removed prior to surgery or surgery may need to be canceled/ delayed if the surgeon/ anesthesia feels like they are unable to  be safely monitored.   Do not shave  48 hours prior to surgery.    Do not bring valuables to the hospital. [place].   Patients discharged on the day of surgery will not be allowed to drive home.  Special Instructions: Bring a copy of your healthcare power of attorney and living will documents         the day of surgery if you haven't scanned them before.   Please read over the following fact sheets you were given: IF YOU HAVE QUESTIONS ABOUT YOUR PRE-OP INSTRUCTIONS PLEASE CALL [facility] - Preparing for Surgery Before surgery, you can play an important role.  Because skin is not sterile, your skin needs to be as free of germs as possible.  You can  reduce the number of germs on your skin by washing with CHG (chlorahexidine gluconate) soap before surgery.  CHG is an antiseptic cleaner which kills germs and bonds with the skin to continue killing germs even after washing. Please DO NOT use if you have an allergy to CHG or antibacterial soaps.  If your skin becomes reddened/irritated stop using the CHG and inform your nurse when you arrive at Short Stay. Do not shave (including legs and underarms) for at least 48 hours prior to the first CHG shower.  You may shave your face/neck.  Please follow these instructions carefully:  1.  Shower with CHG Soap the night before surgery and the  morning of surgery.  2.  If you choose to wash your hair, wash your hair first as usual with your normal  shampoo.  3.  After you shampoo, rinse your hair and body thoroughly to remove the shampoo.                             4.  Use CHG as you would any other liquid soap.  You can apply chg directly to the skin and wash.  Gently with a scrungie or clean washcloth.  5.  Apply the CHG Soap to your body ONLY FROM THE NECK DOWN.   Do   not use on face/ open                           Wound or open sores. Avoid contact with eyes, ears mouth and   genitals (private parts).                       Wash face,  Genitals (private parts) with your normal soap.             6.  Wash thoroughly, paying special attention to the area where your    surgery  will be performed.  7.  Thoroughly rinse your body with warm water from the neck down.  8.  DO NOT shower/wash with your normal soap after using and rinsing off the CHG Soap.                9.  Pat yourself dry with a clean towel.            10.  Wear clean pajamas.            11.  Place clean sheets on your bed the night of your first shower and do  not  sleep with pets. Day of Surgery : Do not apply any lotions/deodorants the morning of surgery.  Please wear clean clothes to the hospital/surgery center.  FAILURE TO FOLLOW THESE  INSTRUCTIONS MAY RESULT IN THE CANCELLATION OF YOUR SURGERY  PATIENT SIGNATURE_________________________________  NURSE SIGNATURE__________________________________  ________________________________________________________________________  WHAT IS A BLOOD TRANSFUSION? Blood Transfusion Information  A transfusion is the replacement of blood or some of its parts. Blood is made up of multiple cells which provide different functions. Red blood cells carry oxygen and are used for blood loss replacement. White blood cells fight against infection. Platelets control bleeding. Plasma helps clot blood. Other blood products are available for specialized needs, such as hemophilia or other clotting disorders. BEFORE THE TRANSFUSION  Who gives blood for transfusions?  Healthy volunteers who are fully evaluated to make sure their blood is safe. This is blood bank blood. Transfusion therapy is the safest it has ever been in the practice of medicine. Before blood is taken from a donor, a complete history is taken to make sure that person has no history of diseases nor engages in risky social behavior (examples are intravenous drug use or sexual activity with multiple partners). The donor's travel history is screened to minimize risk of transmitting infections, such as malaria. The donated blood is tested for signs of infectious diseases, such as HIV and hepatitis. The blood is then tested to be sure it is compatible with you in order to minimize the chance of a transfusion reaction. If you or a relative donates blood, this is often done in anticipation of surgery and is not appropriate for emergency situations. It takes many days to process the donated blood. RISKS AND COMPLICATIONS Although transfusion therapy is very safe and saves many lives, the main dangers of transfusion include:  Getting an infectious disease. Developing a transfusion reaction. This is an allergic reaction to something in the blood you  were given. Every precaution is taken to prevent this. The decision to have a blood transfusion has been considered carefully by your caregiver before blood is given. Blood is not given unless the benefits outweigh the risks. AFTER THE TRANSFUSION Right after receiving a blood transfusion, you will usually feel much better and more energetic. This is especially true if your red blood cells have gotten low (anemic). The transfusion raises the level of the red blood cells which carry oxygen, and this usually causes an energy increase. The nurse administering the transfusion will monitor you carefully for complications. HOME CARE INSTRUCTIONS  No special instructions are needed after a transfusion. You may find your energy is better. Speak with your caregiver about any limitations on activity for underlying diseases you may have. SEEK MEDICAL CARE IF:  Your condition is not improving after your transfusion. You develop redness or irritation at the intravenous (IV) site. SEEK IMMEDIATE MEDICAL CARE IF:  Any of the following symptoms occur over the next 12 hours: Shaking chills. You have a temperature by mouth above 102 F (38.9 C), not controlled by medicine. Chest, back, or muscle pain. People around you feel you are not acting correctly or are confused. Shortness of breath or difficulty breathing. Dizziness and fainting. You get a rash or develop hives. You have a decrease in urine output. Your urine turns a dark color or changes to pink, red, or brown. Any of the following symptoms occur over the next 10 days: You have a temperature by mouth above 102 F (38.9 C), not controlled by medicine. Shortness of breath.  Weakness after normal activity. The white part of the eye turns yellow (jaundice). You have a decrease in the amount of urine or are urinating less often. Your urine turns a dark color or changes to pink, red, or brown. Document Released: 01/21/2000 Document Revised: 04/17/2011  Document Reviewed: 09/09/2007 [facility] Patient Information 2014 [place], [place].  _______________________________________________________________________

## 2020-11-16 NOTE — Progress Notes (Addendum)
Interview completed with legal guardian present (grandfather)  COVID swab appointment: n/a  COVID Vaccine Completed: yes x3 Date COVID Vaccine completed: Has received booster: COVID vaccine manufacturer: Pfizer      Date of COVID positive in last 90 days: no  PCP - Montez Morita, MD Cardiologist - n/a  Chest x-ray - n/a EKG -  Stress Test - n/a ECHO - 04/23/18 Care Everywhere Cardiac Cath - n/a Pacemaker/ICD device last checked: n/a Spinal Cord Stimulator: n/a  Sleep Study - n/a CPAP -   Fasting Blood Sugar - 70-300 Checks Blood Sugar ___3__ times a day  Blood Thinner Instructions: Aspirin Instructions: ASA 81mg , hold 7 days prior to surgery Last Dose:  Activity level: Can go up a flight of stairs and perform activities of daily living without stopping and without symptoms of chest pain or shortness of breath.     Anesthesia review: A1C 8.5, DM, PE  Patient denies shortness of breath, fever, cough and chest pain at PAT appointment   Patient verbalized understanding of instructions that were given to them at the PAT appointment. Patient was also instructed that they will need to review over the PAT instructions again at home before surgery.

## 2020-11-17 ENCOUNTER — Other Ambulatory Visit: Payer: Self-pay

## 2020-11-17 ENCOUNTER — Inpatient Hospital Stay: Payer: Medicare Other | Admitting: Gynecologic Oncology

## 2020-11-17 ENCOUNTER — Encounter (HOSPITAL_COMMUNITY): Payer: Self-pay

## 2020-11-17 ENCOUNTER — Encounter (HOSPITAL_COMMUNITY)
Admission: RE | Admit: 2020-11-17 | Discharge: 2020-11-17 | Disposition: A | Discharge status: Home / Self Care | Payer: Medicare Other | Source: Ambulatory Visit | Attending: Gynecologic Oncology | Admitting: Gynecologic Oncology

## 2020-11-17 DIAGNOSIS — R1900 Intra-abdominal and pelvic swelling, mass and lump, unspecified site: Secondary | ICD-10-CM | POA: Diagnosis not present

## 2020-11-17 DIAGNOSIS — R971 Elevated cancer antigen 125 [CA 125]: Secondary | ICD-10-CM | POA: Diagnosis not present

## 2020-11-17 DIAGNOSIS — E119 Type 2 diabetes mellitus without complications: Secondary | ICD-10-CM | POA: Diagnosis not present

## 2020-11-17 DIAGNOSIS — Z01818 Encounter for other preprocedural examination: Secondary | ICD-10-CM | POA: Diagnosis not present

## 2020-11-17 LAB — HEMOGLOBIN A1C
Hgb A1c MFr Bld: 8.5 % — ABNORMAL HIGH (ref 4.8–5.6)
Mean Plasma Glucose: 197.25 mg/dL

## 2020-11-17 LAB — COMPREHENSIVE METABOLIC PANEL
ALT: 16 U/L (ref 0–44)
AST: 21 U/L (ref 15–41)
Albumin: 3.9 g/dL (ref 3.5–5.0)
Alkaline Phosphatase: 56 U/L (ref 38–126)
Anion gap: 6 (ref 5–15)
BUN: 13 mg/dL (ref 6–20)
CO2: 24 mmol/L (ref 22–32)
Calcium: 9.7 mg/dL (ref 8.9–10.3)
Chloride: 109 mmol/L (ref 98–111)
Creatinine, Ser: 0.66 mg/dL (ref 0.44–1.00)
GFR, Estimated: >60 mL/min (ref >60)
Glucose, Bld: 95 mg/dL (ref 70–99)
Potassium: 4.3 mmol/L (ref 3.5–5.1)
Sodium: 139 mmol/L (ref 135–145)
Total Bilirubin: 0.4 mg/dL (ref 0.3–1.2)
Total Protein: 7.7 g/dL (ref 6.5–8.1)

## 2020-11-17 LAB — CBC
HCT: 32.8 % — ABNORMAL LOW (ref 36.0–46.0)
Hemoglobin: 10.1 g/dL — ABNORMAL LOW (ref 12.0–15.0)
MCH: 30.8 pg (ref 26.0–34.0)
MCHC: 30.8 g/dL (ref 30.0–36.0)
MCV: 100 fL (ref 80.0–100.0)
Platelets: 453 10*3/uL — ABNORMAL HIGH (ref 150–400)
RBC: 3.28 MIL/uL — ABNORMAL LOW (ref 3.87–5.11)
RDW: 14.6 % (ref 11.5–15.5)
WBC: 5.6 10*3/uL (ref 4.0–10.5)
nRBC: 0 % (ref 0.0–0.2)

## 2020-11-17 LAB — URINALYSIS, ROUTINE W REFLEX MICROSCOPIC
Bilirubin Urine: NEGATIVE
Glucose, UA: NEGATIVE mg/dL
Hgb urine dipstick: NEGATIVE
Ketones, ur: NEGATIVE mg/dL
Leukocytes,Ua: NEGATIVE
Nitrite: NEGATIVE
Protein, ur: NEGATIVE mg/dL
Specific Gravity, Urine: 1.013 (ref 1.005–1.030)
pH: 5 (ref 5.0–8.0)

## 2020-11-17 LAB — GLUCOSE, CAPILLARY: Glucose-Capillary: 138 mg/dL — ABNORMAL HIGH (ref 70–99)

## 2020-11-17 MED ORDER — IBUPROFEN 800 MG PO TABS: 800.0000 mg | ORAL_TABLET | Freq: Three times a day (TID) | ORAL | 0 refills | Status: DC | PRN

## 2020-11-17 MED ORDER — ERYTHROMYCIN BASE 500 MG PO TABS: ORAL_TABLET | ORAL | 0 refills | Status: DC

## 2020-11-17 MED ORDER — PEG 3350-KCL-NABCB-NACL-NASULF 236 G PO SOLR: 2000.0000 mL | Freq: Once | ORAL | 0 refills | Status: AC

## 2020-11-17 MED ORDER — NEOMYCIN SULFATE 500 MG PO TABS: ORAL_TABLET | ORAL | 0 refills | Status: DC

## 2020-11-17 MED ORDER — TRAMADOL HCL 50 MG PO TABS: 50.0000 mg | ORAL_TABLET | Freq: Four times a day (QID) | ORAL | 0 refills | Status: DC | PRN

## 2020-11-17 MED ORDER — SENNOSIDES-DOCUSATE SODIUM 8.6-50 MG PO TABS: 2.0000 | ORAL_TABLET | Freq: Every day | ORAL | 0 refills | Status: DC

## 2020-11-17 NOTE — Patient Instructions (Addendum)
Preparing for your Surgery   Plan for surgery on November 23, 2020 with Dr. Jeral Pinch at [place] will be scheduled for robotic assisted laparoscopic pelvic mass resection, possible unilateral vs bilateral salpingo-oophorectomy (removal of one or both ovaries and fallopian tubes), possible total laparoscopic hysterectomy (removal of the uterus and cervix), possible staging if a cancer is identified, possible laparotomy (larger incision on your abdomen if needed).    Pre-operative Testing - (DONE) You will receive a phone call from presurgical testing at [facility] to arrange for a pre-operative appointment and lab work.   -Bring your insurance card, copy of an advanced directive if applicable, medication list   -At that visit, you will be asked to sign a consent for a possible blood transfusion in case a transfusion becomes necessary during surgery.  The need for a blood transfusion is rare but having consent is a necessary part of your care.      -You should not be taking blood thinners or aspirin at least ten days prior to surgery unless instructed by your surgeon.   -Do not take supplements such as fish oil (omega 3), red yeast rice, turmeric before your surgery. You want to avoid medications with aspirin in them including headache powders such as BC or Goody's), Excedrin migraine.   Day Before Surgery at [place]   If your bowels are filled with gas, your surgeon will have difficulty visualizing your pelvic organs which increases your surgical risks.   Your role in recovery We will plan to send you home the same day of surgery once you have recovered.    Your role is to become active as soon as directed by your doctor, while still giving yourself time to heal.  Rest when you feel tired. You will be asked to do the following in order to speed your recovery:   - Cough and breathe deeply. This helps to clear and expand your lungs and  can prevent pneumonia after surgery.  - Coffee. Do mild physical activity. Walking or moving your legs help your circulation and body functions return to normal. Do not try to get up or walk alone the first time after surgery.   -If you develop swelling on one leg or the other, pain in the back of your leg, redness/warmth in one of your legs, please call the office or go to the Emergency Room to have a doppler to rule out a blood clot. For shortness of breath, chest pain-seek care in the Emergency Room as soon as possible. - Actively manage your pain. Managing your pain lets you move in comfort. We will ask you to rate your pain on a scale of zero to 10. It is your responsibility to tell your doctor or nurse where and how much you hurt so your pain can be treated.   Special Considerations -If you are diabetic, you may be placed on insulin after surgery to have closer control over your blood sugars to promote healing and recovery.  This does not mean that you will be discharged on insulin.  If applicable, your oral antidiabetics will be resumed when you are tolerating a solid diet.   -Your final pathology results from surgery should be available around one week after surgery and the results will be relayed to you when available.   -Dr. Lahoma Crocker is the surgeon that assists your GYN Oncologist with surgery.  If you end up staying  the night, the next day after your surgery you will either see Dr. Denman George, Dr. Berline Lopes, or Dr. Lahoma Crocker.   -FMLA forms can be faxed to [phone] and please allow 5-7 business days for completion.   Risks of Surgery Risks of surgery are low but include bleeding, infection, damage to surrounding structures, re-operation, blood clots, and very rarely death.     Blood Transfusion Information (For the consent to be signed before surgery)   We will be checking your blood type before surgery so in case of emergencies, we will know what  type of blood you would need.                                             WHAT IS A BLOOD TRANSFUSION?   A transfusion is the replacement of blood or some of its parts. Blood is made up of multiple cells which provide different functions. Red blood cells carry oxygen and are used for blood loss replacement. White blood cells fight against infection. Platelets control bleeding. Plasma helps clot blood. Other blood products are available for specialized needs, such as hemophilia or other clotting disorders. BEFORE THE TRANSFUSION  Who gives blood for transfusions?  You may be able to donate blood to be used at a later date on yourself (autologous donation). Relatives can be asked to donate blood. This is generally not any safer than if you have received blood from a stranger. The same precautions are taken to ensure safety when a relative's blood is donated. Healthy volunteers who are fully evaluated to make sure their blood is safe. This is blood bank blood. Transfusion therapy is the safest it has ever been in the practice of medicine. Before blood is taken from a donor, a complete history is taken to make sure that person has no history of diseases nor engages in risky social behavior (examples are intravenous drug use or sexual activity with multiple partners). The donor's travel history is screened to minimize risk of transmitting infections, such as malaria. The donated blood is tested for signs of infectious diseases, such as HIV and hepatitis. The blood is then tested to be sure it is compatible with you in order to minimize the chance of a transfusion reaction. If you or a relative donates blood, this is often done in anticipation of surgery and is not appropriate for emergency situations. It takes many days to process the donated blood. RISKS AND COMPLICATIONS Although transfusion therapy is very safe and saves many lives, the main dangers of transfusion include:  Getting an infectious  disease. Developing a transfusion reaction. This is an allergic reaction to something in the blood you were given. Every precaution is taken to prevent this. The decision to have a blood transfusion has been considered carefully by your caregiver before blood is given. Blood is not given unless the benefits outweigh the risks.   AFTER SURGERY INSTRUCTIONS   Return to work: 4-6 weeks if applicable   Activity: 1. Be up and out of the bed during the day.  Take a nap if needed.  You may walk up steps but be careful and use the hand rail.  Stair climbing will tire you more than you think, you may need to stop part way and rest.    2. No lifting or straining for 6 weeks over 10 pounds. No pushing, pulling, straining for  6 weeks.   3. No driving for 1 week if you are cleared to drive before surgery.  Do not drive if you are taking narcotic pain medicine and make sure that your reaction time has returned.    4. You can shower as soon as the next day after surgery. Shower daily.  Use your regular soap and water (not directly on the incision) and pat your incision(s) dry afterwards; don't rub.  No tub baths or submerging your body in water until cleared by your surgeon. If you have the soap that was given to you by pre-surgical testing that was used before surgery, you do not need to use it afterwards because this can irritate your incisions.    5. No sexual activity and nothing in the vagina for 4 weeks. Nothing in the vagina for 8 weeks if you have a hysterectomy.   6. You may experience a small amount of clear drainage from your incisions, which is normal.  If the drainage persists, increases, or changes color please call the office.   7. Do not use creams, lotions, or ointments such as neosporin on your incisions after surgery until advised by your surgeon because they can cause removal of the dermabond glue on your incisions.     8. You may experience vaginal spotting after surgery or around the  6-8 week mark from surgery when the stitches at the top of the vagina begin to dissolve.  The spotting is normal but if you experience heavy bleeding, call our office.   9. Take Tylenol or ibuprofen first for pain and only use narcotic pain medication for severe pain not relieved by the Tylenol or Ibuprofen.  Monitor your Tylenol intake to a max of 4,000 mg in a 24 hour period. You can alternate these medications after surgery.   Diet: 1. Low sodium Heart Healthy Diet is recommended but you are cleared to resume your normal (before surgery) diet after your procedure.   2. It is safe to use a laxative, such as Miralax or Colace, if you have difficulty moving your bowels. You have been prescribed Sennakot at bedtime every evening to keep bowel movements regular and to prevent constipation.     Wound Care: 1. Keep clean and dry.  Shower daily.   Reasons to call the Doctor: Fever - Oral temperature greater than 100.4 degrees Fahrenheit Foul-smelling vaginal discharge Difficulty urinating Nausea and vomiting Increased pain at the site of the incision that is unrelieved with pain medicine. Difficulty breathing with or without chest pain New calf pain especially if only on one side Sudden, continuing increased vaginal bleeding with or without clots.   Contacts: For questions or concerns you should contact:   Dr. Jeral Pinch at [phone]   Joylene John, NP at [phone]   After Hours: call [phone] and have the GYN Oncologist paged/contacted (after 5 pm or on the weekends).   Messages sent via mychart are for non-urgent matters and are not responded to after hours so for urgent needs, please call the after hours number.        [facility] BOWEL PREP     FIVE DAYS PRIOR TO YOUR SURGERY   Obtain supplies for the bowel prep at a pharmacy of your choice: Office-e-mailed prescriptions for your antibiotic pills (Neomycin & Erythromycin)  1 jug of Golytely at the pharmacy       Change your diet to make the bowel prep go more easily: Switch to a bland, low fiber diet Stop eating  any nuts, popcorn, or fruit with seeds.  Stop all fiber supplements such as Metamucil, Miralax, etc.     Improve nutrition: Consider drinking 2-3 nutritional shakes (Ex: Ensure Surgery) every day, starting 5 days prior to surgery     DAY PRIOR TO SURGERY    Switch to a full liquid diet the day before surgery Drink plenty of liquids all day to avoid getting dehydrated      12:00pm Drink 1/2 jug of Golytely.   (You should finish in 2 hours)   2:00pm Take 2 Neomycin 500mg  tablets & 2 Erythromycin 500mg  tablets   3:00pm Take 2 Neomycin 500mg  tablets & 2 Erythromycin 500mg  tablets  Drink plenty of clear liquids all evening to avoid getting dehydrated   10:00pm Take 2 Neomycin 500mg  tablets & 2 Erythromycin 500mg  tablets  Drink 2 Carbohydrate loading nutrition drinks (ex: Ensure Presurgery). These will be given at pre-op appointment. Do not eat anything solid after bedtime (midnight) the night before your surgery.   BUT DO drink plenty of clear liquids (Water, Gatorade, juice, soda, coffee, tea, broths, etc.) up to 3 hours prior to surgery to avoid getting dehydrated.      MORNING OF SURGERY   Remember to not to eat anything solid that morning  Drink one final carbohydrate loading nutritional drink (ex: Ensure Presurgery) upon waking up in the morning (needs to be 3 hours before your surgery). Hold or take medications as recommended by the hospital staff at your Preoperative visit Stop drinking liquids before you leave the house (>3 hours prior to surgery)       If you have questions or concerns, please call GYN Oncology Office at [phone] during business hours to speak to the clinical staff for advice.       WHAT DO I NEED TO KNOW ABOUT A FULL LIQUID DIET? -You may have any liquid. -You may have any food that becomes a liquid at room temperature. The food is  considered a liquid if it can be poured off a spoon at room temperature. WHAT FOODS CAN I EAT? -Grains: Any grain food that can be pureed in soup (such as crackers, pasta, and rice). Hot cereal (such as farina or oatmeal) that has been blended.  -Fruits: Fruit juice, including nectars and juices. -Meats and Other Protein Sources: Eggs in custard, eggnog mix, and eggs used in ice cream or pudding. Strained meats, like in baby food, may be allowed. Consult your health care provider.  -Dairy: Milk and milk-based beverages, including milk shakes and instant breakfast mixes. Smooth yogurt. Pureed cottage cheese. Avoid these foods if they are not well tolerated. -Beverages: All beverages, including liquid nutritional supplements. AVOID CARBONATED BEVERAGES. -Condiments: Iodized salt, pepper, spices, and flavorings. Cocoa powder. Vinegar, ketchup, yellow mustard, smooth sauces (such as hollandaise, cheese sauce, or white sauce), and soy sauce. -Sweets and Desserts: Custard, smooth pudding. Flavored gelatin. Tapioca, junket. Plain ice cream, sherbet, fruit ices. Frozen ice pops, frozen fudge pops, pudding pops, and other frozen bars with cream. Syrups, including chocolate syrup. Sugar, honey, jelly.  -Fats and Oils: Margarine, butter, cream, sour cream, and oils. -Other: Broth and cream soups. Strained, broth-based soups. The items listed above may not be a complete list of recommended foods or beverages.  WHAT FOODS CAN I NOT EAT? Grains: All breads. Grains are not allowed unless they are pureed into soup. Vegetables: No raw vegetables. Vegetables are not allowed unless they are juiced, or cooked and pureed into soup. Fruits: Fruits are not  allowed unless they are juiced. Meats and Other Protein Sources: Any meat or fish. Cooked or raw eggs. Nut butters.  Dairy: Cheese.  Condiments: Stone ground mustards. Fats and Oils: Fats that are coarse or chunky. Sweets and Desserts: Ice cream or other frozen  desserts that have any solids in them or on top, such as nuts, chocolate chips, and pieces of cookies. Cakes. Cookies. Candy. Others: Soups with chunks or pieces in them.

## 2020-11-17 NOTE — Progress Notes (Deleted)
Patient here with her uncle and nephew for new patient consultation and for a pre-operative discussion prior to her scheduled surgery on November 23, 2020. She is scheduled for robotic assisted laparoscopic pelvic mass resection, possible unilateral vs bilateral salpingo-oophorectomy, possible total laparoscopic hysterectomy, possible staging if a cancer is identified, possible laparotomy.  The surgery was discussed in detail.  See after visit summary for additional details.    Discussed measures to take at home to prevent DVT including frequent mobility.  Reportable signs and symptoms of DVT discussed. Post-operative instructions discussed and expectations for after surgery. Incisional care discussed as well including reportable signs and symptoms including erythema, drainage, wound separation.     10 minutes spent with the patient.  Verbalizing understanding of material discussed. No needs or concerns voiced at the end of the visit.   Advised patient and family to call for any needs.    This appointment will be repeated closer to surgery when her grandfather who is her legal guardian can be present. We will plan to discuss the bowel prep in detail and will obtain consent for a hysterectomy form at that visit.    This appointment is included in the global surgical bundle as pre-operative teaching and has no charge.

## 2020-11-18 ENCOUNTER — Telehealth: Payer: Self-pay | Admitting: *Deleted

## 2020-11-18 ENCOUNTER — Inpatient Hospital Stay: Payer: Medicare Other | Attending: Gynecologic Oncology | Admitting: Gynecologic Oncology

## 2020-11-18 VITALS — BP 113/83 | HR 99 | Temp 98.4°F | Resp 20 | Ht 63.0 in | Wt 124.0 lb

## 2020-11-18 DIAGNOSIS — R1900 Intra-abdominal and pelvic swelling, mass and lump, unspecified site: Secondary | ICD-10-CM

## 2020-11-18 LAB — CEA: CEA: 3.8 ng/mL (ref 0.0–4.7)

## 2020-11-18 NOTE — Telephone Encounter (Signed)
Received surgical optimization form and fax to pre admission, original in chart

## 2020-11-18 NOTE — Patient Instructions (Signed)
Preparing for your Surgery   Plan for surgery on November 23, 2020 with Dr. Jeral Pinch at [facility] will be scheduled for robotic assisted laparoscopic pelvic mass resection, possible unilateral vs bilateral salpingo-oophorectomy (removal of one or both ovaries and fallopian tubes), possible total laparoscopic hysterectomy (removal of the uterus and cervix), possible staging if a cancer is identified, possible laparotomy (larger incision on your abdomen if needed).   Please keep tight control over your blood sugars. If elevated, your surgery could be postponed. Also elevated glucose levels can increase the risk of complications including infection, wound healing.   Pre-operative Testing - (DONE) You will receive a phone call from presurgical testing at [facility] to arrange for a pre-operative appointment and lab work.   -Bring your insurance card, copy of an advanced directive if applicable, medication list   -At that visit, you will be asked to sign a consent for a possible blood transfusion in case a transfusion becomes necessary during surgery.  The need for a blood transfusion is rare but having consent is a necessary part of your care.      -You should not be taking blood thinners or aspirin at least ten days prior to surgery unless instructed by your surgeon.   -Do not take supplements such as fish oil (omega 3), red yeast rice, turmeric before your surgery. You want to avoid medications with aspirin in them including headache powders such as BC or Goody's), Excedrin migraine.   Day Before Surgery at [place]   If your bowels are filled with gas, your surgeon will have difficulty visualizing your pelvic organs which increases your surgical risks.   Your role in recovery We will plan to send you home the same day of surgery once you have recovered.    Your role is to become active as soon as directed by your doctor, while still  giving yourself time to heal.  Rest when you feel tired. You will be asked to do the following in order to speed your recovery:   - Cough and breathe deeply. This helps to clear and expand your lungs and can prevent pneumonia after surgery.  - [place]. Do mild physical activity. Walking or moving your legs help your circulation and body functions return to normal. Do not try to get up or walk alone the first time after surgery.   -If you develop swelling on one leg or the other, pain in the back of your leg, redness/warmth in one of your legs, please call the office or go to the Emergency Room to have a doppler to rule out a blood clot. For shortness of breath, chest pain-seek care in the Emergency Room as soon as possible. - Actively manage your pain. Managing your pain lets you move in comfort. We will ask you to rate your pain on a scale of zero to 10. It is your responsibility to tell your doctor or nurse where and how much you hurt so your pain can be treated.   Special Considerations -If you are diabetic, you may be placed on insulin after surgery to have closer control over your blood sugars to promote healing and recovery.  This does not mean that you will be discharged on insulin.  If applicable, your oral antidiabetics will be resumed when you are tolerating a solid diet.   -Your final pathology results from surgery should be available around one week after surgery and  the results will be relayed to you when available.   -Dr. Lahoma Crocker is the surgeon that assists your GYN Oncologist with surgery.  If you end up staying the night, the next day after your surgery you will either see Dr. Denman George, Dr. Berline Lopes, or Dr. Lahoma Crocker.   -FMLA forms can be faxed to [phone] and please allow 5-7 business days for completion.   Risks of Surgery Risks of surgery are low but include bleeding, infection, damage to surrounding structures, re-operation, blood clots,  and very rarely death.     Blood Transfusion Information (For the consent to be signed before surgery)   We will be checking your blood type before surgery so in case of emergencies, we will know what type of blood you would need.                                             WHAT IS A BLOOD TRANSFUSION?   A transfusion is the replacement of blood or some of its parts. Blood is made up of multiple cells which provide different functions. Red blood cells carry oxygen and are used for blood loss replacement. White blood cells fight against infection. Platelets control bleeding. Plasma helps clot blood. Other blood products are available for specialized needs, such as hemophilia or other clotting disorders. BEFORE THE TRANSFUSION  Who gives blood for transfusions?  You may be able to donate blood to be used at a later date on yourself (autologous donation). Relatives can be asked to donate blood. This is generally not any safer than if you have received blood from a stranger. The same precautions are taken to ensure safety when a relative's blood is donated. Healthy volunteers who are fully evaluated to make sure their blood is safe. This is blood bank blood. Transfusion therapy is the safest it has ever been in the practice of medicine. Before blood is taken from a donor, a complete history is taken to make sure that person has no history of diseases nor engages in risky social behavior (examples are intravenous drug use or sexual activity with multiple partners). The donor's travel history is screened to minimize risk of transmitting infections, such as malaria. The donated blood is tested for signs of infectious diseases, such as HIV and hepatitis. The blood is then tested to be sure it is compatible with you in order to minimize the chance of a transfusion reaction. If you or a relative donates blood, this is often done in anticipation of surgery and is not appropriate for emergency situations. It  takes many days to process the donated blood. RISKS AND COMPLICATIONS Although transfusion therapy is very safe and saves many lives, the main dangers of transfusion include:  Getting an infectious disease. Developing a transfusion reaction. This is an allergic reaction to something in the blood you were given. Every precaution is taken to prevent this. The decision to have a blood transfusion has been considered carefully by your caregiver before blood is given. Blood is not given unless the benefits outweigh the risks.   AFTER SURGERY INSTRUCTIONS   Return to work: 4-6 weeks if applicable   Activity: 1. Be up and out of the bed during the day.  Take a nap if needed.  You may walk up steps but be careful and use the hand rail.  Stair climbing will tire you more than  you think, you may need to stop part way and rest.    2. No lifting or straining for 6 weeks over 10 pounds. No pushing, pulling, straining for 6 weeks.   3. No driving for 1 week if you are cleared to drive before surgery.  Do not drive if you are taking narcotic pain medicine and make sure that your reaction time has returned.    4. You can shower as soon as the next day after surgery. Shower daily.  Use your regular soap and water (not directly on the incision) and pat your incision(s) dry afterwards; don't rub.  No tub baths or submerging your body in water until cleared by your surgeon. If you have the soap that was given to you by pre-surgical testing that was used before surgery, you do not need to use it afterwards because this can irritate your incisions.    5. No sexual activity and nothing in the vagina for 4 weeks. Nothing in the vagina (no tampons, douching, intercourse) for 8 weeks if you have a hysterectomy.   6. You may experience a small amount of clear drainage from your incisions, which is normal.  If the drainage persists, increases, or changes color please call the office.   7. Do not use creams, lotions, or  ointments such as neosporin on your incisions after surgery until advised by your surgeon because they can cause removal of the dermabond glue on your incisions.     8. You may experience vaginal spotting after surgery or around the 6-8 week mark from surgery when the stitches at the top of the vagina begin to dissolve (IF YOU HAVE A HYSTERECTOMY).  The spotting is normal but if you experience heavy bleeding, call our office.   9. Take Tylenol or ibuprofen first for pain and only use narcotic pain medication for severe pain not relieved by the Tylenol or Ibuprofen.  Monitor your Tylenol intake to a max of 4,000 mg in a 24 hour period. You can alternate these medications after surgery.   Diet: 1. Low sodium Heart Healthy Diet is recommended but you are cleared to resume your normal (before surgery) diet after your procedure.   2. It is safe to use a laxative, such as Miralax or Colace, if you have difficulty moving your bowels. You have been prescribed Sennakot at bedtime every evening to keep bowel movements regular and to prevent constipation.     Wound Care: 1. Keep clean and dry.  Shower daily.   Reasons to call the Doctor: Fever - Oral temperature greater than 100.4 degrees Fahrenheit Foul-smelling vaginal discharge Difficulty urinating Nausea and vomiting Increased pain at the site of the incision that is unrelieved with pain medicine. Difficulty breathing with or without chest pain New calf pain especially if only on one side Sudden, continuing increased vaginal bleeding with or without clots.   Contacts: For questions or concerns you should contact:   Dr. Jeral Pinch at [phone]   Joylene John, NP at [phone]   After Hours: call [phone] and have the GYN Oncologist paged/contacted (after 5 pm or on the weekends).   Messages sent via mychart are for non-urgent matters and are not responded to after hours so for urgent needs, please call the after hours  number.     [facility]ON BOWEL PREP     FIVE DAYS PRIOR TO YOUR SURGERY   Obtain supplies for the bowel prep at a pharmacy of your choice: Office-e-mailed prescriptions for your antibiotic pills (  Neomycin & Erythromycin)  1 jug of Golytely at the pharmacy     Change your diet to make the bowel prep go more easily: Switch to a bland, low fiber diet Stop eating any nuts, popcorn, or fruit with seeds.  Stop all fiber supplements such as Metamucil, Miralax, etc.     Improve nutrition: Consider drinking 2-3 nutritional shakes (Ex: Glucerna) every day, starting 5 days prior to surgery     DAY PRIOR TO SURGERY    Switch to a full liquid diet the day before surgery Drink plenty of liquids all day to avoid getting dehydrated      12:00pm Drink 1/2 jug of Golytely (mix the full amount but only drink half)   (You should finish in 2 hours)   2:00pm Take 2 Neomycin 500mg  tablets & 2 Erythromycin 500mg  tablets   3:00pm Take 2 Neomycin 500mg  tablets & 2 Erythromycin 500mg  tablets  Drink plenty of clear liquids all evening to avoid getting dehydrated   10:00pm Take 2 Neomycin 500mg  tablets & 2 Erythromycin 500mg  tablets  Drink 2 Carbohydrate loading nutrition drinks (ex: G2 pre-surgery drinks given to you). These will be given at pre-op appointment. Do not eat anything solid after bedtime (midnight) the night before your surgery.   BUT DO drink plenty of clear liquids (Water, Gatorade, juice, soda, coffee, tea, broths, etc.) up to 3 hours prior to surgery to avoid getting dehydrated.      MORNING OF SURGERY   Remember to not to eat anything solid that morning  Drink one final pre-surgery nutritional drink (ex: G2 pre-surgery drinks) upon waking up in the morning (needs to be 3 hours before your surgery). Hold or take medications as recommended by the hospital staff at your Preoperative visit Stop drinking liquids before you leave the house (>3 hours prior to surgery)       If you have  questions or concerns, please call GYN Oncology Office at [phone] during business hours to speak to the clinical staff for advice.       WHAT DO I NEED TO KNOW ABOUT A FULL LIQUID DIET? -You may have any liquid. -You may have any food that becomes a liquid at room temperature. The food is considered a liquid if it can be poured off a spoon at room temperature. WHAT FOODS CAN I EAT? -Grains: Any grain food that can be pureed in soup (such as crackers, pasta, and rice). Hot cereal (such as farina or oatmeal) that has been blended.  -Fruits: Fruit juice, including nectars and juices. -Meats and Other Protein Sources: Eggs in custard, eggnog mix, and eggs used in ice cream or pudding. Strained meats, like in baby food, may be allowed. Consult your health care provider.  -Dairy: Milk and milk-based beverages, including milk shakes and instant breakfast mixes. Smooth yogurt. Pureed cottage cheese. Avoid these foods if they are not well tolerated. -Beverages: All beverages, including liquid nutritional supplements. AVOID CARBONATED BEVERAGES. -Condiments: Iodized salt, pepper, spices, and flavorings. Cocoa powder. Vinegar, ketchup, yellow mustard, smooth sauces (such as hollandaise, cheese sauce, or white sauce), and soy sauce. -Sweets and Desserts: Custard, smooth pudding. Flavored gelatin. Tapioca, junket. Plain ice cream, sherbet, fruit ices. Frozen ice pops, frozen fudge pops, pudding pops, and other frozen bars with cream. Syrups, including chocolate syrup. Sugar, honey, jelly.  -Fats and Oils: Margarine, butter, cream, sour cream, and oils. -Other: Broth and cream soups. Strained, broth-based soups. The items listed above may not be a complete list of  recommended foods or beverages.  WHAT FOODS CAN I NOT EAT? Grains: All breads. Grains are not allowed unless they are pureed into soup. Vegetables: No raw vegetables. Vegetables are not allowed unless they are juiced, or cooked and pureed into  soup. Fruits: Fruits are not allowed unless they are juiced. Meats and Other Protein Sources: Any meat or fish. Cooked or raw eggs. Nut butters.  Dairy: Cheese.  Condiments: Stone ground mustards. Fats and Oils: Fats that are coarse or chunky. Sweets and Desserts: Ice cream or other frozen desserts that have any solids in them or on top, such as nuts, chocolate chips, and pieces of cookies. Cakes. Cookies. Candy. Others: Soups with chunks or pieces in them.

## 2020-11-18 NOTE — Progress Notes (Signed)
Patient here with his grandfather (legal guardian) for a pre-operative appointment prior to her scheduled surgery on November 23, 2020. She is scheduled for robotic assisted laparoscopic pelvic mass resection, possible unilateral vs bilateral salpingo-oophorectomy, possible total laparoscopic hysterectomy, possible staging if a cancer is identified, possible laparotomy. She had her pre-admission testing appointment yesterday am at [facility].  The surgery was discussed in detail.  See after visit summary for additional details. Visual aids used to discuss items related to surgery including sequential compression stockings, foley catheter, IV pump, multi-modal pain regimen including tylenol, photo of the surgical robot, female reproductive system to discuss surgery in detail.      Discussed post-op pain management in detail including the aspects of the enhanced recovery pathway.  Advised her that a new prescription would be sent in for tramadol and it is only to be used for after her upcoming surgery.  We discussed the use of tylenol post-op and to monitor for a maximum of 4,000 mg in a 24 hour period.  Also prescribed sennakot to be used after surgery and to hold if having loose stools.  Discussed bowel regimen in detail.     Discussed the use of SCDs and measures to take at home to prevent DVT including frequent mobility.  Reportable signs and symptoms of DVT discussed. Post-operative instructions discussed and expectations for after surgery. Incisional care discussed as well including reportable signs and symptoms including erythema, drainage, wound separation.   Bowel prep discussed in detail. Medications prescribed for the prep including erythromycin, neomycin, and Golytely. Pre-operative lab work reviewed as well. Discussed keeping a log of her blood sugars and limiting sugary foods. Her grandfather states she like to eat cookies and other sugary foods but advised patient to limit these foods. Discussed  the risks of elevated glucose in regards to potential cancellation of her surgery and post-operative complications.     15 minutes spent with the patient.  Verbalizing understanding of material discussed. No needs or concerns voiced at the end of the visit.   Advised patient and family to call for any needs.  Advised that her pre-op and post-operative medications had been prescribed and could be picked up at any time. The grandfather signed the hysterectomy form after discussion.  This appointment is included in the global surgical bundle as pre-operative teaching and has no charge.

## 2020-11-19 ENCOUNTER — Telehealth: Payer: Self-pay

## 2020-11-19 NOTE — Telephone Encounter (Signed)
Spoke with Mr. Spell patients grandfather. Informed him that Debbie Dorsey's surgery needs to be moved from Tuesday 11/23/20 to Thursday 11/25/20. Instructed that bowel prep is to be completed on Wednesday 11/24/20. He verbalized understanding. Instructed to call with any questions or concerns.

## 2020-11-19 NOTE — Progress Notes (Signed)
Anesthesia Chart Review   Case: [id number] Date/Time: 11/23/20 1430   Procedures:      XI ROBOTIC ASSISTED BILATERAL SALPINGO OOPHORECTOMY VS UNILATERAL SALPINGO OOPHORECTOMY     EXCISION MASS     POSSIBLE XI ROBOTIC ASSISTED TOTAL HYSTERECTOMY,POSSIBLE LAPAROTOMY AND POSSIBLE STAGING   Anesthesia type: General   Pre-op diagnosis: PELVIC MASS   Location: [place] [facility] / [facility]   Surgeons: Lafonda Mosses, MD       DISCUSSION:50 y.o. never smoker with h/o DM II, schizophrenia, PE 2020, pelvic mass scheduled for above procedure 11/23/2020 with Dr. Jeral Pinch.   A1C 8.5, forwarded to surgeon.  CBG DOS.  VS: BP 114/78   Pulse 100   Temp 37.3 C (Oral)   Resp 16   Ht 5\' 4"  (1.626 m)   LMP 11/14/2020 (Approximate)   SpO2 100%   BMI 21.28 kg/m   PROVIDERS: Prince Solian, MD is PCP    LABS: Labs reviewed: Acceptable for surgery. (all labs ordered are listed, but only abnormal results are displayed)  Labs Reviewed  HEMOGLOBIN A1C - Abnormal; Notable for the following components:      Result Value   Hgb A1c MFr Bld 8.5 (*)    All other components within normal limits  CBC - Abnormal; Notable for the following components:   RBC 3.28 (*)    Hemoglobin 10.1 (*)    HCT 32.8 (*)    Platelets 453 (*)    All other components within normal limits  URINALYSIS, ROUTINE W REFLEX MICROSCOPIC - Abnormal; Notable for the following components:   Color, Urine STRAW (*)    All other components within normal limits  GLUCOSE, CAPILLARY - Abnormal; Notable for the following components:   Glucose-Capillary 138 (*)    All other components within normal limits  CEA  COMPREHENSIVE METABOLIC PANEL  TYPE AND SCREEN     IMAGES:   EKG: 11/17/2020 Rate 89 bpm  NSR  CV: Echo 04/23/2018 Summary:  1. The EF is estimated at 50-55%.  2. Left ventricular diastolic function is indeterminate on current  study.  3. There is a trivial amount of mitral regurgitation.   Past Medical  History:  Diagnosis Date   Diabetes mellitus without complication ([place])    History of pulmonary embolism    Schizophrenia ([place])     Past Surgical History:  Procedure Laterality Date   BREAST SURGERY      MEDICATIONS:  acetaminophen (TYLENOL) 325 MG tablet   albuterol (VENTOLIN HFA) 108 (90 Base) MCG/ACT inhaler   Ascorbic Acid (VITAMIN C) 1000 MG tablet   erythromycin base (E-MYCIN) 500 MG tablet   gabapentin (NEURONTIN) 300 MG capsule   HUMALOG KWIKPEN 100 UNIT/ML KwikPen   ibuprofen (ADVIL) 800 MG tablet   LANTUS SOLOSTAR 100 UNIT/ML Solostar Pen   naphazoline-pheniramine (ALLERGY EYE) 0.025-0.3 % ophthalmic solution   neomycin (MYCIFRADIN) 500 MG tablet   pantoprazole (PROTONIX) 40 MG tablet   perphenazine (TRILAFON) 4 MG tablet   [START ON 11/22/2020] polyethylene glycol (GOLYTELY) 236 g solution   rosuvastatin (CRESTOR) 5 MG tablet   senna-docusate (SENOKOT-S) 8.6-50 MG tablet   traMADol (ULTRAM) 50 MG tablet   No current facility-administered medications for this encounter.     Konrad Felix Ward, PA-C [facility] Pre-Surgical Testing [phone]

## 2020-11-23 ENCOUNTER — Inpatient Hospital Stay: Payer: Medicare Other

## 2020-11-23 ENCOUNTER — Telehealth: Payer: Self-pay

## 2020-11-23 ENCOUNTER — Other Ambulatory Visit: Payer: Self-pay

## 2020-11-23 DIAGNOSIS — R1900 Intra-abdominal and pelvic swelling, mass and lump, unspecified site: Secondary | ICD-10-CM

## 2020-11-23 NOTE — Telephone Encounter (Signed)
Spent 15 minutes on the phone with legal guardian. Mr. Debbie Dorsey is patient's grandfather.  He was not able to locate the bowel prep  and pre-op instructions given to him in correct sequence. Will have him come in today at 3:15 pm to review reprinted updated copies of instructions.

## 2020-11-23 NOTE — Patient Instructions (Signed)
Preparing for your Surgery   Plan for surgery on November 25, 2020 at 07:30 am (ARRIVE AT 05:30 AM) with Dr. Jeral Pinch at [facility] will be scheduled for robotic assisted laparoscopic pelvic mass resection, possible unilateral vs bilateral salpingo-oophorectomy (removal of one or both ovaries and fallopian tubes), possible total laparoscopic hysterectomy (removal of the uterus and cervix), possible staging if a cancer is identified, possible laparotomy (larger incision on your abdomen if needed).    Please keep tight control over your blood sugars. If elevated, your surgery could be postponed. Also elevated glucose levels can increase the risk of complications including infection, wound healing.   Pre-operative Testing - (DONE) You will receive a phone call from presurgical testing at [facility] to arrange for a pre-operative appointment and lab work.   -Bring your insurance card, copy of an advanced directive if applicable, medication list   -At that visit, you will be asked to sign a consent for a possible blood transfusion in case a transfusion becomes necessary during surgery.  The need for a blood transfusion is rare but having consent is a necessary part of your care.      -You should not be taking blood thinners or aspirin at least ten days prior to surgery unless instructed by your surgeon.   -Do not take supplements such as fish oil (omega 3), red yeast rice, turmeric before your surgery. You want to avoid medications with aspirin in them including headache powders such as BC or Goody's), Excedrin migraine.   Day Before Surgery at [place]   If your bowels are filled with gas, your surgeon will have difficulty visualizing your pelvic organs which increases your surgical risks.   Your role in recovery We will plan to send you home the same day of surgery once you have recovered.    Your role is to become active as soon as  directed by your doctor, while still giving yourself time to heal.  Rest when you feel tired. You will be asked to do the following in order to speed your recovery:   - Cough and breathe deeply. This helps to clear and expand your lungs and can prevent pneumonia after surgery.  - [place]. Do mild physical activity. Walking or moving your legs help your circulation and body functions return to normal. Do not try to get up or walk alone the first time after surgery.   -If you develop swelling on one leg or the other, pain in the back of your leg, redness/warmth in one of your legs, please call the office or go to the Emergency Room to have a doppler to rule out a blood clot. For shortness of breath, chest pain-seek care in the Emergency Room as soon as possible. - Actively manage your pain. Managing your pain lets you move in comfort. We will ask you to rate your pain on a scale of zero to 10. It is your responsibility to tell your doctor or nurse where and how much you hurt so your pain can be treated.   Special Considerations -If you are diabetic, you may be placed on insulin after surgery to have closer control over your blood sugars to promote healing and recovery.  This does not mean that you will be discharged on insulin.  If applicable, your oral antidiabetics will be resumed when you are tolerating a solid diet.   -Your final pathology results from surgery should  be available around one week after surgery and the results will be relayed to you when available.   -Dr. Lahoma Crocker is the surgeon that assists your GYN Oncologist with surgery.  If you end up staying the night, the next day after your surgery you will either see Dr. Denman George, Dr. Berline Lopes, or Dr. Lahoma Crocker.   -FMLA forms can be faxed to [phone] and please allow 5-7 business days for completion.   Risks of Surgery Risks of surgery are low but include bleeding, infection, damage to surrounding  structures, re-operation, blood clots, and very rarely death.     Blood Transfusion Information (For the consent to be signed before surgery)   We will be checking your blood type before surgery so in case of emergencies, we will know what type of blood you would need.                                             WHAT IS A BLOOD TRANSFUSION?   A transfusion is the replacement of blood or some of its parts. Blood is made up of multiple cells which provide different functions. Red blood cells carry oxygen and are used for blood loss replacement. White blood cells fight against infection. Platelets control bleeding. Plasma helps clot blood. Other blood products are available for specialized needs, such as hemophilia or other clotting disorders. BEFORE THE TRANSFUSION  Who gives blood for transfusions?  You may be able to donate blood to be used at a later date on yourself (autologous donation). Relatives can be asked to donate blood. This is generally not any safer than if you have received blood from a stranger. The same precautions are taken to ensure safety when a relative's blood is donated. Healthy volunteers who are fully evaluated to make sure their blood is safe. This is blood bank blood. Transfusion therapy is the safest it has ever been in the practice of medicine. Before blood is taken from a donor, a complete history is taken to make sure that person has no history of diseases nor engages in risky social behavior (examples are intravenous drug use or sexual activity with multiple partners). The donor's travel history is screened to minimize risk of transmitting infections, such as malaria. The donated blood is tested for signs of infectious diseases, such as HIV and hepatitis. The blood is then tested to be sure it is compatible with you in order to minimize the chance of a transfusion reaction. If you or a relative donates blood, this is often done in anticipation of surgery and is not  appropriate for emergency situations. It takes many days to process the donated blood. RISKS AND COMPLICATIONS Although transfusion therapy is very safe and saves many lives, the main dangers of transfusion include:  Getting an infectious disease. Developing a transfusion reaction. This is an allergic reaction to something in the blood you were given. Every precaution is taken to prevent this. The decision to have a blood transfusion has been considered carefully by your caregiver before blood is given. Blood is not given unless the benefits outweigh the risks.   AFTER SURGERY INSTRUCTIONS   Return to work: 4-6 weeks if applicable   Activity: 1. Be up and out of the bed during the day.  Take a nap if needed.  You may walk up steps but be careful and use the hand rail.  Stair climbing will tire you more than you think, you may need to stop part way and rest.    2. No lifting or straining for 6 weeks over 10 pounds. No pushing, pulling, straining for 6 weeks.   3. No driving for 1 week if you are cleared to drive before surgery.  Do not drive if you are taking narcotic pain medicine and make sure that your reaction time has returned.    4. You can shower as soon as the next day after surgery. Shower daily.  Use your regular soap and water (not directly on the incision) and pat your incision(s) dry afterwards; don't rub.  No tub baths or submerging your body in water until cleared by your surgeon. If you have the soap that was given to you by pre-surgical testing that was used before surgery, you do not need to use it afterwards because this can irritate your incisions.    5. No sexual activity and nothing in the vagina for 4 weeks. Nothing in the vagina (no tampons, douching, intercourse) for 8 weeks if you have a hysterectomy.   6. You may experience a small amount of clear drainage from your incisions, which is normal.  If the drainage persists, increases, or changes color please call the  office.   7. Do not use creams, lotions, or ointments such as neosporin on your incisions after surgery until advised by your surgeon because they can cause removal of the dermabond glue on your incisions.     8. You may experience vaginal spotting after surgery or around the 6-8 week mark from surgery when the stitches at the top of the vagina begin to dissolve (IF YOU HAVE A HYSTERECTOMY).  The spotting is normal but if you experience heavy bleeding, call our office.   9. Take Tylenol or ibuprofen first for pain and only use narcotic pain medication for severe pain not relieved by the Tylenol or Ibuprofen.  Monitor your Tylenol intake to a max of 4,000 mg in a 24 hour period. You can alternate these medications after surgery.   Diet: 1. Low sodium Heart Healthy Diet is recommended but you are cleared to resume your normal (before surgery) diet after your procedure.   2. It is safe to use a laxative, such as Miralax or Colace, if you have difficulty moving your bowels. You have been prescribed Sennakot at bedtime every evening to keep bowel movements regular and to prevent constipation.     Wound Care: 1. Keep clean and dry.  Shower daily.   Reasons to call the Doctor: Fever - Oral temperature greater than 100.4 degrees Fahrenheit Foul-smelling vaginal discharge Difficulty urinating Nausea and vomiting Increased pain at the site of the incision that is unrelieved with pain medicine. Difficulty breathing with or without chest pain New calf pain especially if only on one side Sudden, continuing increased vaginal bleeding with or without clots.   Contacts: For questions or concerns you should contact:   Dr. Jeral Pinch at [phone]   Joylene John, NP at [phone]   After Hours: call [phone] and have the GYN Oncologist paged/contacted (after 5 pm or on the weekends).   Messages sent via mychart are for non-urgent matters and are not responded to after hours so for  urgent needs, please call the after hours number.      COLON BOWEL PREP     FIVE DAYS PRIOR TO YOUR SURGERY   Obtain supplies for the bowel prep at a pharmacy of  your choice: Office-e-mailed prescriptions for your antibiotic pills (Neomycin & Erythromycin)  1 jug of Golytely at the pharmacy     Change your diet to make the bowel prep go more easily: Switch to a bland, low fiber diet Stop eating any nuts, popcorn, or fruit with seeds.  Stop all fiber supplements such as Metamucil, Miralax, etc.     Improve nutrition: Consider drinking 2-3 nutritional shakes (Ex: Glucerna) every day, starting 5 days prior to surgery     DAY PRIOR TO SURGERY    Switch to a full liquid diet the day before surgery Drink plenty of liquids all day to avoid getting dehydrated      12:00pm Drink 1/2 jug of Golytely (mix the full amount but only drink half)   (You should finish in 2 hours)   2:00pm Take 2 Neomycin 500mg  tablets & 2 Erythromycin 500mg  tablets   3:00pm Take 2 Neomycin 500mg  tablets & 2 Erythromycin 500mg  tablets  Drink plenty of clear liquids all evening to avoid getting dehydrated   10:00pm Take 2 Neomycin 500mg  tablets & 2 Erythromycin 500mg  tablets  Drink 2 Carbohydrate loading nutrition drinks (ex: G2 pre-surgery drinks given to you). These will be given at pre-op appointment. Do not eat anything solid after bedtime (midnight) the night before your surgery.   BUT DO drink plenty of clear liquids (Water, Gatorade, juice, soda, coffee, tea, broths, etc.) up to 3 hours prior to surgery to avoid getting dehydrated.      MORNING OF SURGERY   Remember to not to eat anything solid that morning  Drink one final pre-surgery nutritional drink (ex: G2 pre-surgery drinks) upon waking up in the morning (needs to be 3 hours before your surgery). Hold or take medications as recommended by the hospital staff at your Preoperative visit Stop drinking liquids before you leave the house (>3  hours prior to surgery)       If you have questions or concerns, please call GYN Oncology Office at [phone] during business hours to speak to the clinical staff for advice.       WHAT DO I NEED TO KNOW ABOUT A FULL LIQUID DIET? -You may have any liquid. -You may have any food that becomes a liquid at room temperature. The food is considered a liquid if it can be poured off a spoon at room temperature. WHAT FOODS CAN I EAT? -Grains: Any grain food that can be pureed in soup (such as crackers, pasta, and rice). Hot cereal (such as farina or oatmeal) that has been blended.  -Fruits: Fruit juice, including nectars and juices. -Meats and Other Protein Sources: Eggs in custard, eggnog mix, and eggs used in ice cream or pudding. Strained meats, like in baby food, may be allowed. Consult your health care provider.  -Dairy: Milk and milk-based beverages, including milk shakes and instant breakfast mixes. Smooth yogurt. Pureed cottage cheese. Avoid these foods if they are not well tolerated. -Beverages: All beverages, including liquid nutritional supplements. AVOID CARBONATED BEVERAGES. -Condiments: Iodized salt, pepper, spices, and flavorings. Cocoa powder. Vinegar, ketchup, yellow mustard, smooth sauces (such as hollandaise, cheese sauce, or white sauce), and soy sauce. -Sweets and Desserts: Custard, smooth pudding. Flavored gelatin. Tapioca, junket. Plain ice cream, sherbet, fruit ices. Frozen ice pops, frozen fudge pops, pudding pops, and other frozen bars with cream. Syrups, including chocolate syrup. Sugar, honey, jelly.  -Fats and Oils: Margarine, butter, cream, sour cream, and oils. -Other: Broth and cream soups. Strained, broth-based soups. The items listed  above may not be a complete list of recommended foods or beverages.  WHAT FOODS CAN I NOT EAT? Grains: All breads. Grains are not allowed unless they are pureed into soup. Vegetables: No raw vegetables. Vegetables are not allowed  unless they are juiced, or cooked and pureed into soup. Fruits: Fruits are not allowed unless they are juiced. Meats and Other Protein Sources: Any meat or fish. Cooked or raw eggs. Nut butters.  Dairy: Cheese.  Condiments: Stone ground mustards. Fats and Oils: Fats that are coarse or chunky. Sweets and Desserts: Ice cream or other frozen desserts that have any solids in them or on top, such as nuts, chocolate chips, and pieces of cookies. Cakes. Cookies. Candy. Others: Soups with chunks or pieces in them.

## 2020-11-23 NOTE — Progress Notes (Unsigned)
Met with patient's grandfather and legal guardian Mr. Spell to review the written  pre-op instructions for surgery 11-26-19.  Reviewed and highlighted instructions for Bowel prep. All medications for pre and post surgery were picked up except for senokot-S.  Told Mr Teryl Lucy that Joylene John, NP said that he could give Ms Cardiff a capful of Miralax at hs beginning Friday 11-26-20. Mr Teryl Lucy verbalized understanding. Teach back used as well for instructions.

## 2020-11-24 ENCOUNTER — Telehealth: Payer: Self-pay

## 2020-11-24 NOTE — Telephone Encounter (Signed)
Telephone call to check on pre-operative status.  Spoke with patients grandfather, patient compliant with pre-operative instructions. She is following a full liquid diet. She is going to start drinking 1/2 jug of golytely at noon today. Mr. Teryl Lucy states he has the directions for the medications and understands the instructions. Reinforced NPO after midnight.  She can have clear liquids until 0430 tomorrow. Patient needs to drink G2 pre-surgery drink at 0430 and nothing after that. No questions or concerns voiced.  Instructed to call for any needs.

## 2020-11-24 NOTE — Anesthesia Preprocedure Evaluation (Addendum)
Anesthesia Evaluation  Patient identified by MRN, date of birth, ID band Patient awake    Reviewed: Allergy & Precautions, NPO status , Patient's Chart, lab work & pertinent test results  Airway Mallampati: II  TM Distance: >3 FB Neck ROM: Full    Dental no notable dental hx.    Pulmonary neg pulmonary ROS,    Pulmonary exam normal breath sounds clear to auscultation       Cardiovascular negative cardio ROS Normal cardiovascular exam Rhythm:Regular Rate:Normal     Neuro/Psych Schizophrenia negative neurological ROS     GI/Hepatic negative GI ROS, Neg liver ROS,   Endo/Other  diabetes, Insulin Dependent  Renal/GU negative Renal ROS  negative genitourinary   Musculoskeletal negative musculoskeletal ROS (+)   Abdominal   Peds negative pediatric ROS (+)  Hematology  (+) anemia ,   Anesthesia Other Findings   Reproductive/Obstetrics negative OB ROS                            Anesthesia Physical Anesthesia Plan  ASA: 3  Anesthesia Plan: General   Post-op Pain Management:    Induction: Intravenous  PONV Risk Score and Plan: 3 and Ondansetron, Dexamethasone, Treatment may vary due to age or medical condition and Midazolam  Airway Management Planned: Oral ETT  Additional Equipment:   Intra-op Plan:   Post-operative Plan: Extubation in OR  Informed Consent: I have reviewed the patients History and Physical, chart, labs and discussed the procedure including the risks, benefits and alternatives for the proposed anesthesia with the patient or authorized representative who has indicated his/her understanding and acceptance.     Dental advisory given  Plan Discussed with: CRNA and Surgeon  Anesthesia Plan Comments:         Anesthesia Quick Evaluation

## 2020-11-25 ENCOUNTER — Ambulatory Visit (HOSPITAL_COMMUNITY): Payer: Medicare Other | Admitting: Physician Assistant

## 2020-11-25 ENCOUNTER — Ambulatory Visit (HOSPITAL_COMMUNITY)
Admission: RE | Admit: 2020-11-25 | Discharge: 2020-11-25 | Disposition: A | Discharge status: Home / Self Care | Payer: Medicare Other | Attending: Gynecologic Oncology | Admitting: Gynecologic Oncology

## 2020-11-25 ENCOUNTER — Other Ambulatory Visit: Payer: Self-pay | Admitting: Gynecologic Oncology

## 2020-11-25 ENCOUNTER — Encounter (HOSPITAL_COMMUNITY)
Admission: RE | Disposition: A | Discharge status: Home / Self Care | Payer: Self-pay | Source: Home / Self Care | Attending: Gynecologic Oncology

## 2020-11-25 ENCOUNTER — Encounter (HOSPITAL_COMMUNITY): Payer: Self-pay | Admitting: Gynecologic Oncology

## 2020-11-25 ENCOUNTER — Telehealth: Payer: Self-pay

## 2020-11-25 ENCOUNTER — Ambulatory Visit (HOSPITAL_COMMUNITY): Payer: Medicare Other | Admitting: Anesthesiology

## 2020-11-25 DIAGNOSIS — R970 Elevated carcinoembryonic antigen [CEA]: Secondary | ICD-10-CM | POA: Insufficient documentation

## 2020-11-25 DIAGNOSIS — Z90722 Acquired absence of ovaries, bilateral: Secondary | ICD-10-CM

## 2020-11-25 DIAGNOSIS — N83201 Unspecified ovarian cyst, right side: Secondary | ICD-10-CM

## 2020-11-25 DIAGNOSIS — Z86711 Personal history of pulmonary embolism: Secondary | ICD-10-CM | POA: Diagnosis not present

## 2020-11-25 DIAGNOSIS — I700 Atherosclerosis of aorta: Secondary | ICD-10-CM | POA: Insufficient documentation

## 2020-11-25 DIAGNOSIS — Z9889 Other specified postprocedural states: Secondary | ICD-10-CM

## 2020-11-25 DIAGNOSIS — Z794 Long term (current) use of insulin: Secondary | ICD-10-CM | POA: Insufficient documentation

## 2020-11-25 DIAGNOSIS — N838 Other noninflammatory disorders of ovary, fallopian tube and broad ligament: Secondary | ICD-10-CM | POA: Diagnosis not present

## 2020-11-25 DIAGNOSIS — R1900 Intra-abdominal and pelvic swelling, mass and lump, unspecified site: Secondary | ICD-10-CM

## 2020-11-25 DIAGNOSIS — E1165 Type 2 diabetes mellitus with hyperglycemia: Secondary | ICD-10-CM | POA: Diagnosis not present

## 2020-11-25 DIAGNOSIS — K5900 Constipation, unspecified: Secondary | ICD-10-CM | POA: Insufficient documentation

## 2020-11-25 DIAGNOSIS — N83202 Unspecified ovarian cyst, left side: Secondary | ICD-10-CM | POA: Diagnosis not present

## 2020-11-25 DIAGNOSIS — N200 Calculus of kidney: Secondary | ICD-10-CM | POA: Diagnosis not present

## 2020-11-25 DIAGNOSIS — R112 Nausea with vomiting, unspecified: Secondary | ICD-10-CM

## 2020-11-25 HISTORY — PX: ROBOTIC ASSISTED BILATERAL SALPINGO OOPHERECTOMY: SHX6078

## 2020-11-25 LAB — ABO/RH: ABO/RH(D): A POS

## 2020-11-25 LAB — PREGNANCY, URINE: Preg Test, Ur: NEGATIVE

## 2020-11-25 LAB — GLUCOSE, CAPILLARY
Glucose-Capillary: 160 mg/dL — ABNORMAL HIGH (ref 70–99)
Glucose-Capillary: 116 mg/dL — ABNORMAL HIGH (ref 70–99)

## 2020-11-25 LAB — TYPE AND SCREEN
ABO/RH(D): A POS
Antibody Screen: NEGATIVE

## 2020-11-25 SURGERY — SALPINGO-OOPHORECTOMY, BILATERAL, ROBOT-ASSISTED
Anesthesia: General

## 2020-11-25 MED ORDER — PROPOFOL 10 MG/ML IV BOLUS
INTRAVENOUS | Status: DC | PRN
  Administered 2020-11-25: 150 mg via INTRAVENOUS

## 2020-11-25 MED ORDER — HYDROMORPHONE HCL 1 MG/ML IJ SOLN
INTRAMUSCULAR | Status: AC
  Administered 2020-11-25: 0.5 mg via INTRAVENOUS
  Filled 2020-11-25: qty 1

## 2020-11-25 MED ORDER — PROPOFOL 10 MG/ML IV BOLUS
INTRAVENOUS | Status: AC
  Filled 2020-11-25: qty 20

## 2020-11-25 MED ORDER — ENSURE PRE-SURGERY PO LIQD
296.0000 mL | Freq: Once | ORAL | Status: DC
  Filled 2020-11-25: qty 296

## 2020-11-25 MED ORDER — PHENYLEPHRINE 40 MCG/ML (10ML) SYRINGE FOR IV PUSH (FOR BLOOD PRESSURE SUPPORT)
PREFILLED_SYRINGE | INTRAVENOUS | Status: AC
  Filled 2020-11-25: qty 10

## 2020-11-25 MED ORDER — CHLORHEXIDINE GLUCONATE 0.12 % MT SOLN
15.0000 mL | Freq: Once | OROMUCOSAL | Status: AC
  Administered 2020-11-25: 15 mL via OROMUCOSAL

## 2020-11-25 MED ORDER — CELECOXIB 200 MG PO CAPS
200.0000 mg | ORAL_CAPSULE | ORAL | Status: AC
  Administered 2020-11-25: 200 mg via ORAL

## 2020-11-25 MED ORDER — HYDROMORPHONE HCL 1 MG/ML IJ SOLN
0.2500 mg | INTRAMUSCULAR | Status: DC | PRN
  Administered 2020-11-25 (×2): 0.25 mg via INTRAVENOUS

## 2020-11-25 MED ORDER — BUPIVACAINE HCL 0.25 % IJ SOLN
INTRAMUSCULAR | Status: DC | PRN
  Administered 2020-11-25: 37 mL

## 2020-11-25 MED ORDER — LACTATED RINGERS IR SOLN
Status: DC | PRN
  Administered 2020-11-25: 1000 mL

## 2020-11-25 MED ORDER — LIDOCAINE HCL (PF) 2 % IJ SOLN
INTRAMUSCULAR | Status: AC
  Filled 2020-11-25: qty 5

## 2020-11-25 MED ORDER — ONDANSETRON HCL 4 MG/2ML IJ SOLN
INTRAMUSCULAR | Status: AC
  Filled 2020-11-25: qty 2

## 2020-11-25 MED ORDER — PHENYLEPHRINE 40 MCG/ML (10ML) SYRINGE FOR IV PUSH (FOR BLOOD PRESSURE SUPPORT)
PREFILLED_SYRINGE | INTRAVENOUS | Status: DC | PRN
  Administered 2020-11-25 (×2): 120 ug via INTRAVENOUS

## 2020-11-25 MED ORDER — ONDANSETRON HCL 4 MG/2ML IJ SOLN: 4.0000 mg | Freq: Once | INTRAMUSCULAR | Status: DC | PRN

## 2020-11-25 MED ORDER — LACTATED RINGERS IV SOLN: INTRAVENOUS | Status: DC

## 2020-11-25 MED ORDER — ORAL CARE MOUTH RINSE: 15.0000 mL | Freq: Once | OROMUCOSAL | Status: AC

## 2020-11-25 MED ORDER — ENSURE PRE-SURGERY PO LIQD
592.0000 mL | Freq: Once | ORAL | Status: DC
  Filled 2020-11-25: qty 592

## 2020-11-25 MED ORDER — MIDAZOLAM HCL 2 MG/2ML IJ SOLN
INTRAMUSCULAR | Status: AC
  Filled 2020-11-25: qty 2

## 2020-11-25 MED ORDER — SODIUM CHLORIDE 0.9% FLUSH: 3.0000 mL | Freq: Two times a day (BID) | INTRAVENOUS | Status: DC

## 2020-11-25 MED ORDER — HEPARIN SODIUM (PORCINE) 5000 UNIT/ML IJ SOLN
5000.0000 [IU] | INTRAMUSCULAR | Status: AC
  Administered 2020-11-25: 5000 [IU] via SUBCUTANEOUS
  Filled 2020-11-25: qty 1

## 2020-11-25 MED ORDER — MIDAZOLAM HCL 2 MG/2ML IJ SOLN
INTRAMUSCULAR | Status: DC | PRN
  Administered 2020-11-25: 2 mg via INTRAVENOUS

## 2020-11-25 MED ORDER — FENTANYL CITRATE (PF) 100 MCG/2ML IJ SOLN
INTRAMUSCULAR | Status: DC | PRN
  Administered 2020-11-25 (×2): 50 ug via INTRAVENOUS

## 2020-11-25 MED ORDER — DEXAMETHASONE SODIUM PHOSPHATE 10 MG/ML IJ SOLN
INTRAMUSCULAR | Status: DC | PRN
  Administered 2020-11-25: 4 mg via INTRAVENOUS

## 2020-11-25 MED ORDER — ROCURONIUM BROMIDE 10 MG/ML (PF) SYRINGE
PREFILLED_SYRINGE | INTRAVENOUS | Status: AC
  Filled 2020-11-25: qty 10

## 2020-11-25 MED ORDER — SCOPOLAMINE 1 MG/3DAYS TD PT72
1.0000 | MEDICATED_PATCH | TRANSDERMAL | Status: DC
  Administered 2020-11-25: 1.5 mg via TRANSDERMAL
  Filled 2020-11-25: qty 1

## 2020-11-25 MED ORDER — DEXAMETHASONE SODIUM PHOSPHATE 10 MG/ML IJ SOLN
INTRAMUSCULAR | Status: AC
  Filled 2020-11-25: qty 1

## 2020-11-25 MED ORDER — FENTANYL CITRATE (PF) 100 MCG/2ML IJ SOLN
INTRAMUSCULAR | Status: AC
  Filled 2020-11-25: qty 2

## 2020-11-25 MED ORDER — ROCURONIUM BROMIDE 10 MG/ML (PF) SYRINGE
PREFILLED_SYRINGE | INTRAVENOUS | Status: DC | PRN
  Administered 2020-11-25: 70 mg via INTRAVENOUS

## 2020-11-25 MED ORDER — ONDANSETRON HCL 4 MG PO TABS: 4.0000 mg | ORAL_TABLET | Freq: Three times a day (TID) | ORAL | 0 refills | Status: DC | PRN

## 2020-11-25 MED ORDER — ACETAMINOPHEN 500 MG PO TABS
1000.0000 mg | ORAL_TABLET | ORAL | Status: AC
  Administered 2020-11-25: 1000 mg via ORAL
  Filled 2020-11-25: qty 2

## 2020-11-25 MED ORDER — STERILE WATER FOR IRRIGATION IR SOLN
Status: DC | PRN
  Administered 2020-11-25: 1000 mL

## 2020-11-25 MED ORDER — ONDANSETRON HCL 4 MG/2ML IJ SOLN
INTRAMUSCULAR | Status: DC | PRN
  Administered 2020-11-25: 4 mg via INTRAVENOUS

## 2020-11-25 MED ORDER — DEXAMETHASONE SODIUM PHOSPHATE 4 MG/ML IJ SOLN: 4.0000 mg | INTRAMUSCULAR | Status: DC

## 2020-11-25 MED ORDER — OXYCODONE HCL 5 MG PO TABS
5.0000 mg | ORAL_TABLET | Freq: Once | ORAL | Status: DC | PRN
Start: 2020-11-25 — End: 2020-11-25

## 2020-11-25 MED ORDER — BUPIVACAINE HCL 0.25 % IJ SOLN
INTRAMUSCULAR | Status: AC
  Filled 2020-11-25: qty 1

## 2020-11-25 MED ORDER — SUGAMMADEX SODIUM 200 MG/2ML IV SOLN
INTRAVENOUS | Status: DC | PRN
  Administered 2020-11-25: 120 mg via INTRAVENOUS

## 2020-11-25 MED ORDER — OXYCODONE HCL 5 MG/5ML PO SOLN
5.0000 mg | Freq: Once | ORAL | Status: DC | PRN
Start: 2020-11-25 — End: 2020-11-25

## 2020-11-25 MED ORDER — LIDOCAINE 2% (20 MG/ML) 5 ML SYRINGE
INTRAMUSCULAR | Status: DC | PRN
  Administered 2020-11-25: 40 mg via INTRAVENOUS

## 2020-11-25 SURGICAL SUPPLY — 65 items
APPLICATOR SURGIFLO ENDO (HEMOSTASIS) IMPLANT
BAG COUNTER SPONGE SURGICOUNT (BAG) IMPLANT
BAG LAPAROSCOPIC 12 15 PORT 16 (BASKET) ×1 IMPLANT
BAG RETRIEVAL 12/15 (BASKET) ×2
BLADE SURG SZ10 CARB STEEL (BLADE) IMPLANT
COVER BACK TABLE 60X90IN (DRAPES) ×2 IMPLANT
COVER TIP SHEARS 8 DVNC (MISCELLANEOUS) ×1 IMPLANT
COVER TIP SHEARS 8MM DA VINCI (MISCELLANEOUS) ×2
DECANTER SPIKE VIAL GLASS SM (MISCELLANEOUS) IMPLANT
DERMABOND ADVANCED (GAUZE/BANDAGES/DRESSINGS) ×1
DERMABOND ADVANCED .7 DNX12 (GAUZE/BANDAGES/DRESSINGS) ×1 IMPLANT
DRAPE ARM DVNC X/XI (DISPOSABLE) ×4 IMPLANT
DRAPE COLUMN DVNC XI (DISPOSABLE) ×1 IMPLANT
DRAPE DA VINCI XI ARM (DISPOSABLE) ×8
DRAPE DA VINCI XI COLUMN (DISPOSABLE) ×2
DRAPE SHEET LG 3/4 BI-LAMINATE (DRAPES) ×2 IMPLANT
DRAPE SURG IRRIG POUCH 19X23 (DRAPES) ×2 IMPLANT
DRSG OPSITE POSTOP 4X6 (GAUZE/BANDAGES/DRESSINGS) IMPLANT
DRSG OPSITE POSTOP 4X8 (GAUZE/BANDAGES/DRESSINGS) IMPLANT
ELECT PENCIL ROCKER SW 15FT (MISCELLANEOUS) IMPLANT
ELECT REM PT RETURN 15FT ADLT (MISCELLANEOUS) ×2 IMPLANT
GAUZE 4X4 16PLY ~~LOC~~+RFID DBL (SPONGE) ×2 IMPLANT
GLOVE SURG ENC MOIS LTX SZ6 (GLOVE) ×8 IMPLANT
GLOVE SURG ENC MOIS LTX SZ6.5 (GLOVE) ×4 IMPLANT
GOWN STRL REUS W/ TWL LRG LVL3 (GOWN DISPOSABLE) ×4 IMPLANT
GOWN STRL REUS W/TWL LRG LVL3 (GOWN DISPOSABLE) ×8
HOLDER FOLEY CATH W/STRAP (MISCELLANEOUS) IMPLANT
IRRIG SUCT STRYKERFLOW 2 WTIP (MISCELLANEOUS) ×2
IRRIGATION SUCT STRKRFLW 2 WTP (MISCELLANEOUS) ×1 IMPLANT
KIT PROCEDURE DA VINCI SI (MISCELLANEOUS)
KIT PROCEDURE DVNC SI (MISCELLANEOUS) IMPLANT
MANIPULATOR ADVINCU DEL 3.0 PL (MISCELLANEOUS) IMPLANT
MANIPULATOR ADVINCU DEL 3.5 PL (MISCELLANEOUS) ×2 IMPLANT
MANIPULATOR UTERINE 4.5 ZUMI (MISCELLANEOUS) IMPLANT
NEEDLE HYPO 21X1.5 SAFETY (NEEDLE) ×2 IMPLANT
NEEDLE SPNL 18GX3.5 QUINCKE PK (NEEDLE) IMPLANT
OBTURATOR OPTICAL STANDARD 8MM (TROCAR) ×2
OBTURATOR OPTICAL STND 8 DVNC (TROCAR) ×1
OBTURATOR OPTICALSTD 8 DVNC (TROCAR) ×1 IMPLANT
PACK ROBOT GYN CUSTOM WL (TRAY / TRAY PROCEDURE) ×2 IMPLANT
PAD POSITIONING PINK XL (MISCELLANEOUS) ×2 IMPLANT
PORT ACCESS TROCAR AIRSEAL 12 (TROCAR) ×1 IMPLANT
PORT ACCESS TROCAR AIRSEAL 5M (TROCAR) ×1
POUCH SPECIMEN RETRIEVAL 10MM (ENDOMECHANICALS) IMPLANT
SCRUB EXIDINE 4% CHG 4OZ (MISCELLANEOUS) ×2 IMPLANT
SEAL CANN UNIV 5-8 DVNC XI (MISCELLANEOUS) ×4 IMPLANT
SEAL XI 5MM-8MM UNIVERSAL (MISCELLANEOUS) ×8
SET TRI-LUMEN FLTR TB AIRSEAL (TUBING) ×2 IMPLANT
SPONGE T-LAP 18X18 ~~LOC~~+RFID (SPONGE) IMPLANT
SURGIFLO W/THROMBIN 8M KIT (HEMOSTASIS) IMPLANT
SUT MNCRL AB 4-0 PS2 18 (SUTURE) IMPLANT
SUT PDS AB 1 TP1 96 (SUTURE) IMPLANT
SUT VIC AB 0 CT1 27 (SUTURE)
SUT VIC AB 0 CT1 27XBRD ANTBC (SUTURE) IMPLANT
SUT VIC AB 2-0 CT1 27 (SUTURE)
SUT VIC AB 2-0 CT1 TAPERPNT 27 (SUTURE) IMPLANT
SUT VIC AB 4-0 PS2 18 (SUTURE) ×4 IMPLANT
SYR 10ML LL (SYRINGE) IMPLANT
TOWEL OR NON WOVEN STRL DISP B (DISPOSABLE) ×2 IMPLANT
TRAP SPECIMEN MUCUS 40CC (MISCELLANEOUS) IMPLANT
TRAY FOLEY MTR SLVR 16FR STAT (SET/KITS/TRAYS/PACK) ×2 IMPLANT
TROCAR XCEL NON-BLD 5MMX100MML (ENDOMECHANICALS) IMPLANT
UNDERPAD 30X36 HEAVY ABSORB (UNDERPADS AND DIAPERS) ×2 IMPLANT
WATER STERILE IRR 1000ML POUR (IV SOLUTION) ×2 IMPLANT
YANKAUER SUCT BULB TIP 10FT TU (MISCELLANEOUS) IMPLANT

## 2020-11-25 NOTE — Progress Notes (Signed)
See RN note. Patient having post-op nausea and emesis.

## 2020-11-25 NOTE — Anesthesia Procedure Notes (Signed)
Procedure Name: Intubation Date/Time: 11/25/2020 7:37 AM Performed by: Niel Hummer, CRNA Pre-anesthesia Checklist: Patient identified, Emergency Drugs available, Suction available and Patient being monitored Patient Re-evaluated:Patient Re-evaluated prior to induction Oxygen Delivery Method: Circle system utilized Preoxygenation: Pre-oxygenation with 100% oxygen Induction Type: IV induction Ventilation: Mask ventilation without difficulty Laryngoscope Size: Mac and 3 Grade View: Grade I Tube type: Oral Tube size: 7.0 mm Number of attempts: 1 Airway Equipment and Method: Stylet Placement Confirmation: ETT inserted through vocal cords under direct vision, positive ETCO2 and breath sounds checked- equal and bilateral Secured at: 23 cm Tube secured with: Tape Dental Injury: Teeth and Oropharynx as per pre-operative assessment

## 2020-11-25 NOTE — Op Note (Signed)
OPERATIVE NOTE  Pre-operative Diagnosis: Complex pelvic mass  Post-operative Diagnosis: same, suspected endometriosis, left paraovarian simple cyst  Operation: Robotic-assisted laparoscopic bilateral salpingectomy and left oophorectomy  Surgeon: Jeral Pinch MD  Assistant Surgeon: Lahoma Crocker MD (an MD assistant was necessary for tissue manipulation, management of robotic instrumentation, retraction and positioning due to the complexity of the case and hospital policies).   Anesthesia: GET  Urine Output: 250cc  Operative Findings: On EUA, mobile cyst on exam within the cul de sac, smooth. 12cm mobile uterus. On intra-abdominal entry, normal upper abdominal survey. Normal small and large bowel, omentum. No ascites. 6cm simple appearing paratubal and para-ovarian cyst. Normal ovary and tube otherwise. Normal right adnexa with 1-2cm simple ovarian cyst. 12cm uterus. During excision of left adnexa, small older appearing clot noted raising possibility of endometriosis. No pelvic peritoneal findings c/w endometriosis.  Estimated Blood Loss:  less than 50 mL      Total IV Fluids: see I&o flowsheet         Specimens: pelvic washings, bilateral tubes, left ovary         Complications:  None apparent; patient tolerated the procedure well.         Disposition: PACU - hemodynamically stable.  Procedure Details  The patient was seen in the Holding Room. The risks, benefits, complications, treatment options, and expected outcomes were discussed with the patient.  The patient concurred with the proposed plan, giving informed consent.  The site of surgery properly noted/marked. The patient was identified as Debbie Dorsey and the procedure verified as a Robotic-assisted pelvic mass excision and any other indicated procedures.   After induction of anesthesia, the patient was draped and prepped in the usual sterile manner. Patient was placed in supine position after anesthesia and draped  and prepped in the usual sterile manner as follows: Her arms were tucked to her side with all appropriate precautions.  The shoulders were stabilized with padded shoulder blocks applied to the acromium processes.  The patient was placed in the semi-lithotomy position in [place].  The perineum and vagina were prepped with CholoraPrep. The patient was draped after the CholoraPrep had been allowed to dry for 3 minutes.  A Time Out was held and the above information confirmed.  The urethra was prepped with Betadine. Foley catheter was placed.  A sterile speculum was placed in the vagina.  The cervix was grasped with a single-tooth tenaculum. The cervix was dilated with Kennon Rounds dilators.  The 3.5 Delineator uterine manipulator with a colpotomizer ring was placed without difficulty.  A pneum occluder balloon was placed over the manipulator.  OG tube placement was confirmed and to suction.   Next, a 10 mm skin incision was made 1 cm below the subcostal margin in the midclavicular line.  The 5 mm Optiview port and scope was used for direct entry.  Opening pressure was under 10 mm CO2.  The abdomen was insufflated and the findings were noted as above.   At this point and all points during the procedure, the patient's intra-abdominal pressure did not exceed 15 mmHg. Next, an 8 mm skin incision was made superior to the umbilicus and a right and left port were placed about 8 cm lateral to the robot port on the right and left side.  A fourth arm was placed on the right.  The 5 mm assist trocar was exchanged for a 10-12 mm port. All ports were placed under direct visualization.  The patient was placed in steep Trendelenburg.  The robot was docked in the normal manner.  Pelvic washings were collected.  Pictures were taken of pelvic findings. Given association with tube and ovary, decision made to perform left USO. The left peritoneum were opened parallel to the IP ligament to open the retroperitoneal space. The ureter  was noted to be on the medial leaf of the broad ligament.  The peritoneum above the ureter was incised and stretched and the infundibulopelvic ligament was skeletonized, cauterized and cut.  The utero-ovarian and fallopian tube were skeletonized, cauterized, and cut, freeing the left adnexa which was placed in an Endocatch bag.  On the right, the fallopian tube was elevated, cauterized along the mesosalpinx, freeing the tube from the underlying ovary. The tube was cauterized and transected just lateral to the fundus and the tube was handed off through the assist trocar. The simple cyst was ruptured during this.   The left adnexa was removed after contained cyst drainage was performed. It was handed off the field and sent for frozen.  Frozen pathology returned showing benign adnexal cyst.  Irrigation was used and excellent hemostasis was achieved.  At this point in the procedure was completed.  Robotic instruments were removed under direct visulaization.  The robot was undocked. The fascia at the 10-12 mm port was closed with 0 Vicryl on a UR-5 needle.  The subcuticular tissue was closed with 4-0 Vicryl and the skin was closed with 4-0 Monocryl in a subcuticular manner.  Dermabond was applied.    The vagina was swabbed with  minimal bleeding noted. Foley catheter was removed. All sponge, lap and needle counts were correct x  3.   The patient was transferred to the recovery room in stable condition.  Jeral Pinch, MD

## 2020-11-25 NOTE — Discharge Instructions (Signed)

## 2020-11-25 NOTE — Telephone Encounter (Signed)
Received call from patients grandfather, he states Debbie Dorsey is having nausea and vomiting. She vomited on the way home from the hospital and again at home after eating some soup. Grandfather is concerned and would like to know what to do. Advised to eat a lighter diet and ensure patient is drinking plenty of fluids and staying hydrated. Melissa Cross,NP sent in prescription for zofran. Instructed to take senokot to avoid constipation. He verbalized understanding and will call with questions or concerns.

## 2020-11-25 NOTE — Transfer of Care (Signed)
Immediate Anesthesia Transfer of Care Note  Patient: Ennis Forts  Procedure(s) Performed: XI ROBOTIC ASSISTED RIGHT SALPINGECTOMY, LEFT SALPINGOOPHORECTOMY  Patient Location: PACU  Anesthesia Type:General  Level of Consciousness: awake, alert  and oriented  Airway & Oxygen Therapy: Patient Spontanous Breathing and Patient connected to face mask oxygen  Post-op Assessment: Report given to RN, Post -op Vital signs reviewed and stable and Patient moving all extremities X 4  Post vital signs: Reviewed and stable  Last Vitals:  Vitals Value Taken Time  BP 134/91 11/25/20 0910  Temp    Pulse 92 11/25/20 0911  Resp 21 11/25/20 0911  SpO2 100 % 11/25/20 0911  Vitals shown include unvalidated device data.  Last Pain:  Vitals:   11/25/20 0618  TempSrc:   PainSc: 0-No pain      Patients Stated Pain Goal: 4 (15/40/08 6761)  Complications: No notable events documented.

## 2020-11-25 NOTE — Anesthesia Postprocedure Evaluation (Signed)
Anesthesia Post Note  Patient: Debbie Dorsey  Procedure(s) Performed: XI ROBOTIC ASSISTED RIGHT SALPINGECTOMY, LEFT SALPINGOOPHORECTOMY     Patient location during evaluation: PACU Anesthesia Type: General Level of consciousness: awake and alert Pain management: pain level controlled Vital Signs Assessment: post-procedure vital signs reviewed and stable Respiratory status: spontaneous breathing, nonlabored ventilation, respiratory function stable and patient connected to nasal cannula oxygen Cardiovascular status: blood pressure returned to baseline and stable Postop Assessment: no apparent nausea or vomiting Anesthetic complications: no   No notable events documented.  Last Vitals:  Vitals:   11/25/20 0948 11/25/20 1000  BP:  (!) 142/87  Pulse:  86  Resp:  16  Temp:    SpO2: 100% 96%    Last Pain:  Vitals:   11/25/20 0948  TempSrc:   PainSc: 0-No pain                 [name] S

## 2020-11-25 NOTE — Interval H&P Note (Signed)
History and Physical Interval Note:  11/25/2020 6:53 AM  Debbie Dorsey  has presented today for surgery, with the diagnosis of PELVIC MASS.  The various methods of treatment have been discussed with the patient and family. After consideration of risks, benefits and other options for treatment, the patient has consented to  Procedure(s): XI ROBOTIC ASSISTED BILATERAL SALPINGO OOPHORECTOMY VS UNILATERAL SALPINGO OOPHORECTOMY (N/A) EXCISION MASS (N/A) POSSIBLE XI ROBOTIC ASSISTED TOTAL HYSTERECTOMY,POSSIBLE LAPAROTOMY AND POSSIBLE STAGING (N/A) as a surgical intervention.  The patient's history has been reviewed, patient examined, no change in status, stable for surgery.  I have reviewed the patient's chart and labs.  Questions were answered to the patient's satisfaction.     Lafonda Mosses

## 2020-11-26 ENCOUNTER — Telehealth: Payer: Self-pay

## 2020-11-26 ENCOUNTER — Encounter (HOSPITAL_COMMUNITY): Payer: Self-pay | Admitting: Gynecologic Oncology

## 2020-11-26 LAB — CYTOLOGY - NON PAP

## 2020-11-26 LAB — SURGICAL PATHOLOGY

## 2020-11-26 NOTE — Telephone Encounter (Signed)
Received call from Mr. Spell stating Debbie Dorsey is having rectal bleeding. She went to the bathroom and saw bright red blood. Mr. Debbie Dorsey is unsure of how much blood but states "a lot". Patient denies pain. Asked if certain that it is rectal bleeding compared to vaginal. Mr. Debbie Dorsey asked Debbie Dorsey and it was determined to be vaginal bleeding. He states she does have a history hemorrhoids and it could be her period. Instructed to put a pad on and monitor the bleeding. If she is soaking through pads and bleeding is heavier than her period to notify our office. He verbalizes understanding. Mr. Debbie Dorsey states Debbie Dorsey  is doing well otherwise. Confirmed that they have both the office number and the after hours number.   Per Dr. Berline Lopes reviewed surgical pathology results with patient and her grandfather.  Pathology shows no malignancy. They are very happy with news.   Instructed to call office with any questions or concerns.

## 2020-11-26 NOTE — Telephone Encounter (Signed)
Attempted to reach patients grandfather Debbie Dorsey to review surgical pathology results. No answer and unable to leave voicemail.

## 2020-11-26 NOTE — Telephone Encounter (Signed)
Spoke with Ms. Brier grandfather this morning. He states she is eating, drinking and urinating well. She had a BM last night.She is taking senokot as prescribed and encouraged her to drink plenty of water. He denies fever or chills. Incisions are dry and intact. Her pain is controlled with tylenol and rates her pain as 1/10.  Instructed to call office with any fever, chills, purulent drainage, uncontrolled pain or any other questions or concerns. Patient verbalizes understanding.   Pt aware of post op appointments as well as the office number [phone] and after hours number [phone] to call if she has any questions or concerns

## 2020-11-30 ENCOUNTER — Telehealth: Payer: Medicare Other | Admitting: Gynecologic Oncology

## 2020-12-10 ENCOUNTER — Encounter: Payer: Self-pay | Admitting: Gynecologic Oncology

## 2020-12-13 ENCOUNTER — Inpatient Hospital Stay: Payer: Medicare Other

## 2020-12-13 ENCOUNTER — Encounter: Payer: Self-pay | Admitting: Gynecologic Oncology

## 2020-12-13 ENCOUNTER — Other Ambulatory Visit: Payer: Self-pay

## 2020-12-13 ENCOUNTER — Inpatient Hospital Stay: Payer: Medicare Other | Attending: Gynecologic Oncology | Admitting: Gynecologic Oncology

## 2020-12-13 VITALS — BP 108/74 | HR 104 | Temp 98.0°F | Resp 16 | Ht 63.0 in | Wt 125.2 lb

## 2020-12-13 DIAGNOSIS — R300 Dysuria: Secondary | ICD-10-CM

## 2020-12-13 DIAGNOSIS — N83202 Unspecified ovarian cyst, left side: Secondary | ICD-10-CM | POA: Diagnosis present

## 2020-12-13 DIAGNOSIS — Z90721 Acquired absence of ovaries, unilateral: Secondary | ICD-10-CM | POA: Diagnosis not present

## 2020-12-13 DIAGNOSIS — Z9079 Acquired absence of other genital organ(s): Secondary | ICD-10-CM | POA: Insufficient documentation

## 2020-12-13 DIAGNOSIS — R1900 Intra-abdominal and pelvic swelling, mass and lump, unspecified site: Secondary | ICD-10-CM

## 2020-12-13 DIAGNOSIS — N83201 Unspecified ovarian cyst, right side: Secondary | ICD-10-CM | POA: Insufficient documentation

## 2020-12-13 NOTE — Progress Notes (Signed)
Gynecologic Oncology Return Clinic Visit  12/13/20  Reason for Visit: follow-up after recent surgery  Treatment History: Robotic BS, left oophorectomy on 11/25/20 in the setting of a complex adnexal mass. Findings at the time of surgery were a left simple para-ovarian cyst and suspected endometriosis.  Interval History: Patient presents today for follow-up after surgery.  She comes in with her grandfather today.  She notes overall doing well although has some continued abdominal pain in her mid abdomen.  This has been improving daily.  She uses Tylenol which provide some relief.  She reports a good appetite without nausea or emesis.  She endorses regular bowel and bladder function.  She notes about a weeks worth of dysuria and pruritus.  Denies any frequency or hematuria.  Grandfather, who is her legal guardian, tells me that the patient is doing "great".  Past Medical/Surgical History: Past Medical History:  Diagnosis Date   Diabetes mellitus without complication ([place])    History of pulmonary embolism    Schizophrenia ([place])     Past Surgical History:  Procedure Laterality Date   BREAST SURGERY     ROBOTIC ASSISTED BILATERAL SALPINGO OOPHERECTOMY N/A 11/25/2020   Procedure: XI ROBOTIC ASSISTED RIGHT SALPINGECTOMY, LEFT SALPINGOOPHORECTOMY;  Surgeon: Lafonda Mosses, MD;  Location: [facility];  Service: Gynecology;  Laterality: N/A;    Family History  Problem Relation Age of Onset   Breast cancer Mother    Prostate cancer Father    Mental illness Sister     Social History   Socioeconomic History   Marital status: Single    Spouse name: Not on file   Number of children: Not on file   Years of education: Not on file   Highest education level: Not on file  Occupational History   Not on file  Tobacco Use   Smoking status: Never   Smokeless tobacco: Never  Vaping Use   Vaping Use: Never used  Substance and Sexual Activity   Alcohol use: No   Drug use: No   Sexual  activity: Not Currently  Other Topics Concern   Not on file  Social History Narrative   Not on file   Social Determinants of Health   Financial Resource Strain: Not on file  Food Insecurity: Not on file  Transportation Needs: Not on file  Physical Activity: Not on file  Stress: Not on file  Social Connections: Not on file    Current Medications:  Current Outpatient Medications:    acetaminophen (TYLENOL) 325 MG tablet, Take 650 mg by mouth every 6 (six) hours as needed for mild pain or headache., Disp: , Rfl:    albuterol (VENTOLIN HFA) 108 (90 Base) MCG/ACT inhaler, Inhale 2 puffs into the lungs every 4 (four) hours as needed for shortness of breath or wheezing., Disp: , Rfl:    Ascorbic Acid (VITAMIN C) 1000 MG tablet, Take 1,000 mg by mouth in the morning., Disp: , Rfl:    gabapentin (NEURONTIN) 300 MG capsule, Take 1 capsule (300 mg total) by mouth 3 (three) times daily., Disp: 90 capsule, Rfl: 1   HUMALOG KWIKPEN 100 UNIT/ML KwikPen, Inject 0-18 Units into the skin with breakfast, with lunch, and with evening meal. If blood sugar is 190 or up-=18 units  If less than 190=0 units, Disp: , Rfl:    LANTUS SOLOSTAR 100 UNIT/ML Solostar Pen, Inject 14 Units into the skin daily., Disp: , Rfl:    naphazoline-pheniramine (ALLERGY EYE) 0.025-0.3 % ophthalmic solution, Place 1-2 drops into both  eyes 4 (four) times daily as needed for eye irritation., Disp: , Rfl:    pantoprazole (PROTONIX) 40 MG tablet, Take 40 mg by mouth in the morning., Disp: , Rfl:    perphenazine (TRILAFON) 4 MG tablet, 1 a day 3 at hs (Patient taking differently: Take 4 mg by mouth in the morning.), Disp: 120 tablet, Rfl: 3   erythromycin base (E-MYCIN) 500 MG tablet, Take 500 mg by mouth. For BEFORE Surgery, Take 2 tablets at 2 pm, 3 pm, and 10 pm the day BEFORE surgery. (Patient not taking: No sig reported), Disp: , Rfl:    ibuprofen (ADVIL) 800 MG tablet, Take 1 tablet (800 mg total) by mouth every 8 (eight) hours as  needed for moderate pain. For AFTER surgery only (Patient not taking: No sig reported), Disp: 30 tablet, Rfl: 0   neomycin (MYCIFRADIN) 500 MG tablet, Take 500 mg by mouth. For BEFORE surgery, Take 2 tablets at 2 pm, 3 pm, and 10 pm the day BEFORE surgery. (Patient not taking: No sig reported), Disp: , Rfl:    ondansetron (ZOFRAN) 4 MG tablet, Take 1 tablet (4 mg total) by mouth every 8 (eight) hours as needed for nausea or vomiting. (Patient not taking: Reported on 12/10/2020), Disp: 5 tablet, Rfl: 0   polyethylene glycol-electrolytes (NULYTELY) 420 g solution, SMARTSIG:Gallon(s) By Mouth (Patient not taking: Reported on 12/10/2020), Disp: , Rfl:    rosuvastatin (CRESTOR) 5 MG tablet, Take 5 mg by mouth daily. (Patient not taking: Reported on 12/10/2020), Disp: , Rfl:    senna-docusate (SENOKOT-S) 8.6-50 MG tablet, Take 2 tablets by mouth at bedtime. For AFTER surgery, do not take if having diarrhea (Patient not taking: No sig reported), Disp: 30 tablet, Rfl: 0   traMADol (ULTRAM) 50 MG tablet, Take 1 tablet (50 mg total) by mouth every 6 (six) hours as needed for severe pain. For AFTER surgery only, do not take and drive (Patient not taking: Reported on 12/10/2020), Disp: 15 tablet, Rfl: 0  Review of Systems: + abdominal pain, back pain, itching Denies appetite changes, fevers, chills, fatigue, unexplained weight changes. Denies hearing loss, neck lumps or masses, mouth sores, ringing in ears or voice changes. Denies cough or wheezing.  Denies shortness of breath. Denies chest pain or palpitations. Denies leg swelling. Denies abdominal distention, blood in stools, constipation, diarrhea, nausea, vomiting, or early satiety. Denies pain with intercourse, dysuria, frequency, hematuria or incontinence. Denies hot flashes, pelvic pain, vaginal bleeding or vaginal discharge.   Denies joint pain or muscle pain/cramps. Denies rash or wounds. Denies dizziness, headaches, numbness or seizures. Denies swollen  lymph nodes or glands, denies easy bruising or bleeding. Denies anxiety, depression, confusion, or decreased concentration.  Physical Exam: BP 108/74 (BP Location: Right Arm, Patient Position: Sitting)   Pulse (!) 104   Temp 98 F (36.7 C) (Oral)   Resp 16   Ht 5\' 3"  (1.6 m)   Wt 125 lb 3.2 oz (56.8 kg)   LMP 11/14/2020 (Approximate)   SpO2 100%   BMI 22.18 kg/m  General: Alert, oriented, no acute distress. HEENT: Normocephalic, atraumatic, sclera anicteric. Chest: Clear to auscultation bilaterally.  Unlabored breathing on room air. Abdomen: soft, nontender, mildly distended, mildly tympanitic.  Normoactive bowel sounds.  No masses or hepatosplenomegaly appreciated.  Well-healed incisions. Extremities: Grossly normal range of motion.  Warm, well perfused.  No edema bilaterally. Skin: No rashes or lesions noted. GU: Normal appearing external genitalia without erythema, excoriation, or lesions.  Speculum exam reveals minimal physiologic appearing  discharge, no significant discharge or bleeding.  No vulvar vaginal erythema.  Laboratory & Radiologic Studies: A. OVARY AND FALLOPIAN TUBE, LEFT, SALPINGO OOPHORECTOMY:  - Left ovary:      Hemorrhagic corpus luteum and corpora albicans.      No endometriosis or malignancy.  - Left Fallopian tube:      Benign paratubal cyst.      No endometriosis or malignancy.   B. FALLOPIAN TUBE, RIGHT, SALPINGECTOMY:  - Right Fallopian tube:      Benign paratubal cyst.      No endometriosis or malignancy.   Assessment & Plan: Debbie Dorsey is a 50 y.o. woman 3 weeks s/p Robotic UO/BS for complex adnexal mass with benign final pathology.  Patient is overall doing well from a postoperative standpoint.  We discussed continued postoperative expectations as well as lifting limitations.  I recommended that the patient could try heating pad or ibuprofen as needed for improving abdominal pain.  With regard to her urinary and vulvar symptoms, we will  plan to send urinalysis and culture today.  If patient has a urinary tract infection, we will treat this with antibiotics.  If no evidence of UTI, then I discussed sending a one-time dose of Diflucan to treat possible yeast infection.  Patient grandfather was given a copy of her final pathology report, which we reviewed in detail again today together.  I will call her once I have urine results.  Otherwise, from a gynecologic standpoint, I am releasing her back to her primary care and gynecologist.  28 minutes of total time was spent for this patient encounter, including preparation, face-to-face counseling with the patient and coordination of care, and documentation of the encounter.  Jeral Pinch, MD  Division of Gynecologic Oncology  Department of Obstetrics and Gynecology  [facility]

## 2020-12-13 NOTE — Patient Instructions (Addendum)
You are healing great from surgery! I am releasing you back to your PCP and gynecologist. Please let me know if you need anything in the future.  Remember no lifting, pushing, or pulling more than 10 pounds until 6 weeks after surgery.  I will call you with urine results.  If it looks like you have a urine infection, we will treat that with antibiotics.  If no urine infection, then we will send a one-time dose to treat a yeast infection given your symptoms.

## 2020-12-14 ENCOUNTER — Other Ambulatory Visit: Payer: Self-pay | Admitting: Gynecologic Oncology

## 2020-12-14 ENCOUNTER — Telehealth: Payer: Self-pay

## 2020-12-14 DIAGNOSIS — N898 Other specified noninflammatory disorders of vagina: Secondary | ICD-10-CM

## 2020-12-14 LAB — URINE CULTURE: Culture: <10000 — AB

## 2020-12-14 MED ORDER — FLUCONAZOLE 150 MG PO TABS: 150.0000 mg | ORAL_TABLET | Freq: Once | ORAL | 0 refills | Status: AC

## 2020-12-14 NOTE — Telephone Encounter (Signed)
Spoke with patients grandfather Mr. Spell. Reviewed urine culture results. Per Joylene John, NP there are no signs of a UTI. He verbalized understanding.  Per Dr. Lulu Riding office note on 12/13/20 "If no urine infection, then we will send a one-time dose to treat a yeast infection given symptoms". Verified pharmacy with Mr. Spell and let him know the prescription will be sent in. Instructed to call for any questions or concerns.

## 2020-12-14 NOTE — Addendum Note (Signed)
Addended by: Joylene John D on: 12/14/2020 03:13 PM   Modules accepted: Orders

## 2020-12-21 NOTE — Progress Notes (Signed)
This encounter was created in error - please disregard.

## 2021-05-31 ENCOUNTER — Encounter: Payer: Self-pay | Admitting: Advanced Practice Midwife

## 2021-05-31 ENCOUNTER — Ambulatory Visit (INDEPENDENT_AMBULATORY_CARE_PROVIDER_SITE_OTHER): Payer: Medicare Other | Admitting: Advanced Practice Midwife

## 2021-05-31 VITALS — BP 118/80 | HR 119 | Wt 132.0 lb

## 2021-05-31 DIAGNOSIS — N924 Excessive bleeding in the premenopausal period: Secondary | ICD-10-CM

## 2021-05-31 DIAGNOSIS — N939 Abnormal uterine and vaginal bleeding, unspecified: Secondary | ICD-10-CM | POA: Diagnosis not present

## 2021-05-31 DIAGNOSIS — Z30430 Encounter for insertion of intrauterine contraceptive device: Secondary | ICD-10-CM

## 2021-05-31 DIAGNOSIS — R140 Abdominal distension (gaseous): Secondary | ICD-10-CM | POA: Diagnosis not present

## 2021-05-31 MED ORDER — LEVONORGESTREL 20.1 MCG/DAY IU IUD
1.0000 | INTRAUTERINE_SYSTEM | Freq: Once | INTRAUTERINE | Status: AC
  Administered 2021-05-31: 1 via INTRAUTERINE

## 2021-05-31 NOTE — Progress Notes (Addendum)
51 y.o GYN presents for heavy periods, pain 8/10 x 4 months  ? ?Last PAP 10/01/2020 ? ?Administrations This Visit   ? ? levonorgestrel (LILETTA) 20.1 MCG/DAY IUD 1 each   ? ? Admin Date ?05/31/2021 Action ?Given Dose ?1 each Route ?Intrauterine Administered By ?Tamela Oddi, RMA  ? ?  ?  ? ?  ?  ?

## 2021-05-31 NOTE — Addendum Note (Signed)
Addended by: Tamela Oddi on: 05/31/2021 03:07 PM ? ? Modules accepted: Orders ? ?

## 2021-05-31 NOTE — Progress Notes (Signed)
? ?  GYNECOLOGY PROGRESS NOTE ? ?History:  ?51 y.o. G1P1001 presents to [place] office today for problem gyn visit. Her hx is significant for bilateral salpingectomy and left oophorectomy in 2022 for pelvic mass.  Her grandfather is her legal guardian and is present for today's evaluation.  She reports heavy irregular menses x 2-3 months.  Periods are painful and heavy, soaking 1 pad/hour for several days.   ? ?She denies h/a, dizziness, shortness of breath, n/v, or fever/chills.   ? ?The following portions of the patient's history were reviewed and updated as appropriate: allergies, current medications, past family history, past medical history, past social history, past surgical history and problem list. Last pap smear on 10/01/2020 was normal. ? ?Health Maintenance Due  ?Topic Date Due  ? COVID-19 Vaccine (1) Never done  ? FOOT EXAM  Never done  ? OPHTHALMOLOGY EXAM  Never done  ? URINE MICROALBUMIN  Never done  ? Hepatitis C Screening  Never done  ? TETANUS/TDAP  Never done  ? COLONOSCOPY (Pts 45-51yr Insurance coverage will need to be confirmed)  Never done  ? HEMOGLOBIN A1C  05/18/2021  ?  ? ?Review of Systems:  ?Pertinent items are noted in HPI. ?  ?Objective:  ?Physical Exam ?Blood pressure 118/80, pulse (!) 119, weight 132 lb (59.9 kg), last menstrual period 05/11/2021, unknown if currently breastfeeding. ?VS reviewed, nursing note reviewed,  ?Constitutional: well developed, well nourished, no distress ?HEENT: normocephalic ?CV: normal rate ?Pulm/chest wall: normal effort ?Breast Exam: deferred ?Abdomen: soft, slightly distended ?Neuro: alert and oriented x 3 ?Skin: warm, dry ?Psych: affect normal ?Pelvic exam: Cervix pink, visually closed, without lesion, scant white creamy discharge, vaginal walls and external genitalia normal ?Bimanual exam: Cervix 0/long/high, firm, anterior, neg CMT, uterus nontender, nonenlarged, adnexa without tenderness, enlargement, or mass ? ? ? ?IUD Procedure Note ?Patient  identified, informed consent performed.  Discussed risks of irregular bleeding, cramping, infection, malpositioning or misplacement of the IUD outside the uterus which may require further procedures. Time out was performed.  Urine pregnancy test negative. ? ?Speculum placed in the vagina.  Cervix visualized.  Cleaned with Betadine x 2.  Grasped anteriorly with a single tooth tenaculum.  Uterus sounded to 6.5 cm.  Liletta IUD placed per manufacturer's recommendations.  Strings trimmed to 3 cm. Tenaculum was removed, good hemostasis noted.  Patient tolerated procedure well.  ? ?Patient was given post-procedure instructions and the Mirena care card with expiration date.  Patient was also asked to check IUD strings periodically and follow up in 4-6 weeks for IUD check.  ? ?Assessment & Plan:  ?1. Abnormal uterine bleeding (AUB) ?--Discussed options, likely perimenopausal, as menses have been regular until this year, then irregular and heavier gradually. ?--Options include OCPs, Depo Provera, or IUD. Pt and legal guardian present for discussion and patient decision supported by guardian. ?--Pt opts to try IUD to improve periods.  Liletta placed today. ?--F/U in 4-6 weeks for string check and in 3 months for AUB ? ?2. Abnormal perimenopausal bleeding ? ? ?3. Encounter for IUD insertion ?--Liletta IUD inserted without difficulty, see procedure note above  ? ?4. Abdominal distension (gaseous) ?--soft, nontender abdomen.  Pt concerned about enlargement.  IUD today for AUB, may reduce bloating with less menses.  Discussed dietary changes to improve digestion, pt to f/u with PCP. ? ?Return for string check in 4-6, then 3 months gyn f/u for AUB.  ? ?LFatima Blank CNM ?2:50 PM  ?

## 2021-06-13 ENCOUNTER — Ambulatory Visit (HOSPITAL_COMMUNITY)
Admission: RE | Admit: 2021-06-13 | Discharge: 2021-06-13 | Disposition: A | Discharge status: Home / Self Care | Payer: Medicare Other | Source: Ambulatory Visit | Attending: Advanced Practice Midwife | Admitting: Advanced Practice Midwife

## 2021-06-13 DIAGNOSIS — N939 Abnormal uterine and vaginal bleeding, unspecified: Secondary | ICD-10-CM | POA: Insufficient documentation

## 2021-06-14 ENCOUNTER — Telehealth: Payer: Self-pay | Admitting: Advanced Practice Midwife

## 2021-06-14 NOTE — Telephone Encounter (Signed)
I called pt to discuss her recent US results.  A family member answered and reported she would call back as soon as she was home.  Pt to return call to [place] at [phone].   ? ?Pt has adenomyosis on recent US.  IUD was placed for AUB and follow up string check is scheduled 07/12/21 with me.  If AUB is well controlled, she does not need any treatment.  If it is not, pt will need to see MD to discuss medical/surgical options.   ? ? ?

## 2021-06-15 ENCOUNTER — Telehealth: Payer: Self-pay | Admitting: Advanced Practice Midwife

## 2021-06-15 NOTE — Telephone Encounter (Signed)
I called patient and reviewed her pelvic US results from 06/13/21.  Liletta IUD was placed on 05/31/21. Pt reports very light bleeding and no pain since IUD placed.  Discussed US findings of possible adenomyosis.  If IUD resolves pt symptoms, she does not need follow up. If she continues to have AUB or cramping pain, she can f/u in the office with MD to discuss further management options including surgery. Pt states understanding. She has IUD string check appt on 07/12/21.  ?

## 2021-07-12 ENCOUNTER — Ambulatory Visit (INDEPENDENT_AMBULATORY_CARE_PROVIDER_SITE_OTHER): Payer: Medicare Other | Admitting: Advanced Practice Midwife

## 2021-07-12 ENCOUNTER — Encounter: Payer: Self-pay | Admitting: Advanced Practice Midwife

## 2021-07-12 VITALS — BP 114/77 | HR 94 | Ht 62.0 in | Wt 129.2 lb

## 2021-07-12 DIAGNOSIS — R140 Abdominal distension (gaseous): Secondary | ICD-10-CM | POA: Insufficient documentation

## 2021-07-12 DIAGNOSIS — K5900 Constipation, unspecified: Secondary | ICD-10-CM | POA: Diagnosis not present

## 2021-07-12 DIAGNOSIS — Z30431 Encounter for routine checking of intrauterine contraceptive device: Secondary | ICD-10-CM | POA: Diagnosis not present

## 2021-07-12 NOTE — Patient Instructions (Signed)
For constipation:  Eat high fiber foods like raisin bran cereal, fruits with the skin (apples, pears, grapes), and salad vegetables.   Drink 6-8 glasses of water a day Try Metamucil   Also try yogurt or a probiotic tablet every day Miralax can be used daily if diet changes do not work

## 2021-07-12 NOTE — Progress Notes (Signed)
   GYNECOLOGY PROGRESS NOTE  History:  51 y.o. G1P1001 presents to [place] office today for an IUD string check but also reports vaginal pain and bloating of her stomach.  Liletta IUD was placed 05/30/21. She denies any problems with the IUD but does have some vaginal pain when she walks.  There is no abdominal pain. She is concerned about her abdomen being enlarged. She does report some chronic constipation.   The following portions of the patient's history were reviewed and updated as appropriate: allergies, current medications, past family history, past medical history, past social history, past surgical history and problem list. Last pap smear on 10/01/20 was normal, negative HRHPV.  Health Maintenance Due  Topic Date Due   COVID-19 Vaccine (1) Never done   FOOT EXAM  Never done   OPHTHALMOLOGY EXAM  Never done   URINE MICROALBUMIN  Never done   Hepatitis C Screening  Never done   TETANUS/TDAP  Never done   COLONOSCOPY (Pts 45-45yr Insurance coverage will need to be confirmed)  Never done   HEMOGLOBIN A1C  05/18/2021     Review of Systems:  Pertinent items are noted in HPI.   Objective:  Physical Exam Blood pressure 114/77, pulse 94, height '5\' 2"'$  (1.575 m), weight 129 lb 3.2 oz (58.6 kg), unknown if currently breastfeeding. VS reviewed, nursing note reviewed,  Constitutional: well developed, well nourished, no distress HEENT: normocephalic CV: normal rate Pulm/chest wall: normal effort Breast Exam: deferred Abdomen: soft Neuro: alert and oriented x 3 Skin: warm, dry Psych: affect normal Pelvic exam: Cervix pink, visually closed, without lesion, scant white creamy discharge, vaginal walls and external genitalia normal Bimanual exam: Cervix 0/long/high, firm, anterior, neg CMT, uterus nontender, nonenlarged, adnexa without tenderness, enlargement, or mass  Assessment & Plan:  1. IUD check up --No complications but with some vaginal sharp pain, may be strings so cut to 3  cm in length, were closer to 4 cm on initial exam  2. Constipation, unspecified constipation type --May contribute to pt bloating/enlarged abdomen --Discussed dietary changes with pt, how to add more fiber to diet and to try yogurt/probiotics. --F/U with PCP  3. Bloated abdomen --Soft and nontender on exam --F/U with PCP if resolving constipation does not improve bloating   Return in about 1 year (around 07/13/2022) for annual exam.   LFatima Blank CNM 10:04 AM

## 2021-07-12 NOTE — Progress Notes (Signed)
Pt presents for IUD string check. Pt c/o of vaginal pain since IUD insertion. She has not had intercourse since insertion.

## 2021-07-18 ENCOUNTER — Telehealth: Payer: Self-pay

## 2021-07-18 NOTE — Telephone Encounter (Signed)
Pt left vm asking for [place] to return call. Attempted to return call, no answer.

## 2021-07-20 ENCOUNTER — Ambulatory Visit (INDEPENDENT_AMBULATORY_CARE_PROVIDER_SITE_OTHER): Payer: Medicare Other | Admitting: Obstetrics

## 2021-07-20 ENCOUNTER — Encounter: Payer: Self-pay | Admitting: Obstetrics

## 2021-07-20 VITALS — BP 103/71 | Ht 62.0 in | Wt 130.6 lb

## 2021-07-20 DIAGNOSIS — Z30432 Encounter for removal of intrauterine contraceptive device: Secondary | ICD-10-CM | POA: Diagnosis not present

## 2021-07-20 DIAGNOSIS — R102 Pelvic and perineal pain: Secondary | ICD-10-CM

## 2021-07-20 NOTE — Progress Notes (Signed)
Patient ID: Debbie Dorsey, female   DOB: [birth year], 51 y.o.   MRN: [id number]  Chief Complaint  Patient presents with   Vaginal Pain    HPI [name] is a 51 y.o. female.  Complains of vaginal pain.  She thinks that the IUD is causing the pain and wants IUD removed.  Denies vaginal discharge or dysuria. HPI  Past Medical History:  Diagnosis Date   Diabetes mellitus without complication ([place])    History of pulmonary embolism    Schizophrenia ([place])     Past Surgical History:  Procedure Laterality Date   BREAST SURGERY     ROBOTIC ASSISTED BILATERAL SALPINGO OOPHERECTOMY N/A 11/25/2020   Procedure: XI ROBOTIC ASSISTED RIGHT SALPINGECTOMY, LEFT SALPINGOOPHORECTOMY;  Surgeon: Lafonda Mosses, MD;  Location: [facility];  Service: Gynecology;  Laterality: N/A;    Family History  Problem Relation Age of Onset   Breast cancer Mother    Prostate cancer Father    Mental illness Sister     Social History Social History   Tobacco Use   Smoking status: Never   Smokeless tobacco: Never  Vaping Use   Vaping Use: Never used  Substance Use Topics   Alcohol use: No   Drug use: No    No Known Allergies  Current Outpatient Medications  Medication Sig Dispense Refill   acetaminophen (TYLENOL) 325 MG tablet Take 650 mg by mouth every 6 (six) hours as needed for mild pain or headache.     albuterol (VENTOLIN HFA) 108 (90 Base) MCG/ACT inhaler Inhale 2 puffs into the lungs every 4 (four) hours as needed for shortness of breath or wheezing.     Ascorbic Acid (VITAMIN C) 1000 MG tablet Take 1,000 mg by mouth in the morning.     gabapentin (NEURONTIN) 300 MG capsule Take 1 capsule (300 mg total) by mouth 3 (three) times daily. 90 capsule 1   HUMALOG KWIKPEN 100 UNIT/ML KwikPen Inject 0-18 Units into the skin with breakfast, with lunch, and with evening meal. If blood sugar is 190 or up-=18 units  If less than 190=0 units     ibuprofen (ADVIL) 800 MG tablet Take 1 tablet (800 mg  total) by mouth every 8 (eight) hours as needed for moderate pain. For AFTER surgery only (Patient not taking: Reported on 11/18/2020) 30 tablet 0   LANTUS SOLOSTAR 100 UNIT/ML Solostar Pen Inject 14 Units into the skin daily.     naphazoline-pheniramine (ALLERGY EYE) 0.025-0.3 % ophthalmic solution Place 1-2 drops into both eyes 4 (four) times daily as needed for eye irritation. (Patient not taking: Reported on 07/12/2021)     ondansetron (ZOFRAN) 4 MG tablet Take 1 tablet (4 mg total) by mouth every 8 (eight) hours as needed for nausea or vomiting. (Patient not taking: Reported on 12/10/2020) 5 tablet 0   pantoprazole (PROTONIX) 40 MG tablet Take 40 mg by mouth in the morning. (Patient not taking: Reported on 05/31/2021)     perphenazine (TRILAFON) 4 MG tablet 1 a day 3 at hs (Patient not taking: Reported on 07/12/2021) 120 tablet 3   rosuvastatin (CRESTOR) 5 MG tablet Take 5 mg by mouth daily. (Patient not taking: Reported on 07/12/2021)     senna-docusate (SENOKOT-S) 8.6-50 MG tablet Take 2 tablets by mouth at bedtime. For AFTER surgery, do not take if having diarrhea (Patient not taking: Reported on 11/18/2020) 30 tablet 0   traMADol (ULTRAM) 50 MG tablet Take 1 tablet (50 mg total) by mouth every 6 (  six) hours as needed for severe pain. For AFTER surgery only, do not take and drive (Patient not taking: Reported on 12/10/2020) 15 tablet 0   No current facility-administered medications for this visit.    Review of Systems Review of Systems Constitutional: negative for fatigue and weight loss Respiratory: negative for cough and wheezing Cardiovascular: negative for chest pain, fatigue and palpitations Gastrointestinal: negative for abdominal pain and change in bowel habits Genitourinary:negative Integument/breast: negative for nipple discharge Musculoskeletal:negative for myalgias Neurological: negative for gait problems and tremors Behavioral/Psych: negative for abusive relationship,  depression Endocrine: negative for temperature intolerance      Blood pressure 103/71, height '5\' 2"'$  (1.575 m), weight 130 lb 9.6 oz (59.2 kg), last menstrual period 06/10/2021, not currently breastfeeding.  Physical Exam Physical Exam General:   Alert and no distress  Skin:   no rash or abnormalities  Lungs:   clear to auscultation bilaterally  Heart:   regular rate and rhythm, S1, S2 normal, no murmur, click, rub or gallop  Breasts:   Not examined  Abdomen:  normal findings: no organomegaly, soft, non-tender and no hernia  Pelvis:  External genitalia: normal general appearance Urinary system: urethral meatus normal and bladder without fullness, nontender Vaginal: normal without tenderness, induration or masses Cervix: normal appearance.  IUD string visible, grasped with forceps and IUD removed ( see note ) Adnexa: normal bimanual exam Uterus: anteverted and non-tender, normal size    I have spent a total of 20 minutes of face-to-face time, excluding clinical staff time, reviewing notes and preparing to see patient, ordering tests and/or medications, and counseling the patient.   Data Reviewed Labs Ultrasound:    US PELVIC COMPLETE WITH TRANSVAGINAL (Accession [id number]) (Order [id number]) Imaging Date: 06/13/2021 Department: [facility] Released By: Leida Lauth, Dynazade V Authorizing: Elvera Maria, CNM   Exam Status  Status  Final [99]   PACS Intelerad Image Link   Show images for US PELVIC COMPLETE WITH TRANSVAGINAL Study Result  Narrative & Impression  CLINICAL DATA:  Initial evaluation for abnormal uterine bleeding. Prior left oophorectomy.   EXAM: TRANSABDOMINAL AND TRANSVAGINAL ULTRASOUND OF PELVIS   TECHNIQUE: Both transabdominal and transvaginal ultrasound examinations of the pelvis were performed. Transabdominal technique was performed for global imaging of the pelvis including uterus, ovaries, adnexal regions, and  pelvic cul-de-sac. It was necessary to proceed with endovaginal exam following the transabdominal exam to visualize the endometrium and ovaries.   COMPARISON:  Prior ultrasound from 08/05/2020.   FINDINGS: Uterus   Measurements: 9.7 x 4.9 x 6.1 cm = volume: 153.1 mL. Uterus is anteverted. Heterogeneous echotexture seen throughout the uterine myometrium with poor definition of the endometrial-myometrial interface, with a few scattered subcentimeter cystic foci, suggesting adenomyosis. No discrete fibroid by sonography. Small nabothian cyst noted at the cervix.   Endometrium   Thickness: 3 mm. No focal abnormality visualized. IUD in appropriate position within the endometrial cavity.   Right ovary   Measurements: 2.8 x 1.8 x 1.2 cm = volume: 3.2 mL. Normal appearance/no adnexal mass.   Left ovary   Not visualized, reportedly surgically absent.  No adnexal mass.   Other findings   No abnormal free fluid.   IMPRESSION: 1. Heterogeneous echotexture throughout the uterine myometrium with poor definition of the endometrial-myometrial interface, suggesting adenomyosis. No discrete fibroids by sonography. 2. Endometrial stripe measures 3 mm in thickness. If bleeding remains unresponsive to hormonal or medical therapy, sonohysterogram should be considered for focal lesion work-up. (Ref: Radiological Reasoning: Algorithmic  Workup of Abnormal Vaginal Bleeding with Endovaginal Sonography and Sonohysterography. AJR 2008; 785:Y85-02). 3. IUD in appropriate position within the endometrial cavity. 4. Normal right ovary, with nonvisualization of the left ovary (reportedly surgically absent). No adnexal mass or free fluid.     Electronically Signed   By: Jeannine Boga M.D.   On: 06/13/2021 21:10        Assessment     1. Pelvic pain in female  2. Encounter for IUD removal ( see procedure note )    Plan   Follow up in 3 months  Shelly Bombard, MD 07/20/2021  10:36 AM        GYNECOLOGY OFFICE PROCEDURE NOTE  ELVINA BOSCH is a 51 y.o. G1P1001 here for [place] IUD removal. No GYN concerns.  Last pap smear was on 10-01-2020 and was normal.  IUD Removal  Patient identified, informed consent performed, consent signed.  Patient was in the dorsal lithotomy position, normal external genitalia was noted.  A speculum was placed in the patient's vagina, normal discharge was noted, no lesions. The cervix was visualized, no lesions, no abnormal discharge.  The strings of the IUD were grasped and pulled using ring forceps. The IUD was removed in its entirety.   Patient tolerated the procedure well.    Patient will use abstinence for contraception/.  Routine preventative health maintenance measures emphasized.   Shelly Bombard, MD, [place] [facility], [place], [place] 07/20/21

## 2021-07-20 NOTE — Progress Notes (Signed)
Pt presents for vaginal and rectum. Denies discharge, itching, odor, irritation. Reports cysts in "private parts" from and U/S in May. Last Pap 11/2020

## 2021-08-01 ENCOUNTER — Telehealth: Payer: Self-pay

## 2021-08-01 NOTE — Telephone Encounter (Signed)
Pt called and stated that she is having breast pain. Pt denies chest pain or difficulty breathing. Advised to schedule an appt to be evaluated, transferred to scheduler.

## 2021-08-01 NOTE — Telephone Encounter (Signed)
Returned call, no answer, left vm 

## 2021-08-02 ENCOUNTER — Other Ambulatory Visit: Payer: Self-pay | Admitting: Student

## 2021-08-02 ENCOUNTER — Encounter: Payer: Self-pay | Admitting: Student

## 2021-08-02 ENCOUNTER — Ambulatory Visit (INDEPENDENT_AMBULATORY_CARE_PROVIDER_SITE_OTHER): Payer: Medicare Other | Admitting: Student

## 2021-08-02 VITALS — Ht 62.0 in | Wt 143.3 lb

## 2021-08-02 DIAGNOSIS — N644 Mastodynia: Secondary | ICD-10-CM | POA: Diagnosis not present

## 2021-08-02 DIAGNOSIS — R1084 Generalized abdominal pain: Secondary | ICD-10-CM

## 2021-08-02 NOTE — Progress Notes (Signed)
Pt presents with breast problem. Reports pain left armpit and left breast that has been going on for about 4 months. Denies change in appearance, color, texture.  Last mammogram 10/26/2020- normal  Has concerns about enlarged abdomen, bloating.

## 2021-08-03 NOTE — Progress Notes (Signed)
  History:  Ms. Debbie Dorsey is a 51 y.o. G1P1001 who presents to clinic today for breast and abdominal pain.  Reports pain left armpit and left breast that has been going on for about 4 months. Denies change in appearance, color, texture. Last mammogram 10/26/2020- normal   Has concerns about enlarged abdomen, bloating that has been going on for 5-66month. Patient denies any constipation, diarrhea, or relief from this discomfort.   The following portions of the patient's history were reviewed and updated as appropriate: allergies, current medications, family history, past medical history, social history, past surgical history and problem list.  Review of Systems:  Review of Systems  Constitutional:  Negative for chills, fever, malaise/fatigue and weight loss.  Respiratory:  Negative for cough and shortness of breath.   Cardiovascular:  Negative for chest pain.  Gastrointestinal:  Positive for abdominal pain. Negative for blood in stool, constipation, diarrhea, heartburn, nausea and vomiting.  Genitourinary:  Negative for dysuria, frequency and urgency.  Psychiatric/Behavioral:  Negative for depression. The patient is nervous/anxious.      Objective:  Physical Exam Ht '5\' 2"'$  (1.575 m)   Wt 143 lb 4.8 oz (65 kg)   LMP 06/10/2021 (Approximate)   BMI 26.21 kg/m  Physical Exam Constitutional:      Appearance: Normal appearance. She is normal weight.  Cardiovascular:     Rate and Rhythm: Normal rate and regular rhythm.  Pulmonary:     Effort: Pulmonary effort is normal.     Breath sounds: Normal breath sounds.  Abdominal:     General: Bowel sounds are normal. There is distension.     Tenderness: There is abdominal tenderness.  Genitourinary:    Comments: Deferred   Neurological:     Mental Status: She is alert. Mental status is at baseline.  Psychiatric:        Mood and Affect: Mood normal.     Comments: Thought content altered    Breasts: Symmetric in size. No masses,  tenderness, skin changes, nipple drainage, or lymphadenopathy bilaterally. Old scarring bilaterally  Labs and Imaging No results found for this or any previous visit (from the past 24 hour(s)).  No results found.   Assessment & Plan:  1. Breast pain - Normal breast exam - MM DIAG BREAST TOMO UNI LEFT; Future  2. Generalized abdominal pain - Severe bloating present on exam - Provided reassurance that current symptoms are not related to gynecological issues given recent ultrasound, screening, and pelvic exams. Care management consulted with Dr. DDamita Dunnings Patient has been recommended to follow-up with a PCP at a prior visit. Discussed with patient the importance of seeking medical attention from primary care or GI team.  - Ambulatory referral to Gastroenterology placed  Approximately 30 minutes of face-to-face time was spent with this patient and their family member to coordinate and counsel care. Caretaker updated on plan of care  FJohnston Ebbs NP

## 2021-08-16 ENCOUNTER — Encounter: Payer: Self-pay | Admitting: Gastroenterology

## 2021-08-18 ENCOUNTER — Ambulatory Visit
Admission: RE | Admit: 2021-08-18 | Discharge: 2021-08-18 | Disposition: A | Discharge status: Home / Self Care | Payer: Medicare Other | Source: Ambulatory Visit | Attending: Student | Admitting: Student

## 2021-08-18 ENCOUNTER — Ambulatory Visit: Payer: Medicare Other

## 2021-08-18 DIAGNOSIS — N644 Mastodynia: Secondary | ICD-10-CM

## 2021-09-07 ENCOUNTER — Other Ambulatory Visit (INDEPENDENT_AMBULATORY_CARE_PROVIDER_SITE_OTHER): Payer: Medicare Other

## 2021-09-07 ENCOUNTER — Encounter: Payer: Self-pay | Admitting: Gastroenterology

## 2021-09-07 ENCOUNTER — Ambulatory Visit (INDEPENDENT_AMBULATORY_CARE_PROVIDER_SITE_OTHER): Payer: Medicare Other | Admitting: Gastroenterology

## 2021-09-07 VITALS — BP 124/74 | HR 104 | Ht 61.0 in | Wt 131.2 lb

## 2021-09-07 DIAGNOSIS — D649 Anemia, unspecified: Secondary | ICD-10-CM

## 2021-09-07 DIAGNOSIS — Z1211 Encounter for screening for malignant neoplasm of colon: Secondary | ICD-10-CM

## 2021-09-07 DIAGNOSIS — R140 Abdominal distension (gaseous): Secondary | ICD-10-CM | POA: Diagnosis not present

## 2021-09-07 LAB — IBC + FERRITIN
Ferritin: 35.2 ng/mL (ref 10.0–291.0)
Iron: 40 ug/dL — ABNORMAL LOW (ref 42–145)
Saturation Ratios: 10.2 % — ABNORMAL LOW (ref 20.0–50.0)
TIBC: 392 ug/dL (ref 250.0–450.0)
Transferrin: 280 mg/dL (ref 212.0–360.0)

## 2021-09-07 LAB — CBC WITH DIFFERENTIAL/PLATELET
Basophils Absolute: 0.1 10*3/uL (ref 0.0–0.1)
Basophils Relative: 1 % (ref 0.0–3.0)
Eosinophils Absolute: 0.3 10*3/uL (ref 0.0–0.7)
Eosinophils Relative: 4.1 % (ref 0.0–5.0)
HCT: 34.5 % — ABNORMAL LOW (ref 36.0–46.0)
Hemoglobin: 11.6 g/dL — ABNORMAL LOW (ref 12.0–15.0)
Lymphocytes Relative: 35.2 % (ref 12.0–46.0)
Lymphs Abs: 2.2 10*3/uL (ref 0.7–4.0)
MCHC: 33.7 g/dL (ref 30.0–36.0)
MCV: 99.9 fl (ref 78.0–100.0)
Monocytes Absolute: 0.4 10*3/uL (ref 0.1–1.0)
Monocytes Relative: 6.8 % (ref 3.0–12.0)
Neutro Abs: 3.3 10*3/uL (ref 1.4–7.7)
Neutrophils Relative %: 52.9 % (ref 43.0–77.0)
Platelets: 401 10*3/uL — ABNORMAL HIGH (ref 150.0–400.0)
RBC: 3.45 Mil/uL — ABNORMAL LOW (ref 3.87–5.11)
RDW: 12.7 % (ref 11.5–15.5)
WBC: 6.2 10*3/uL (ref 4.0–10.5)

## 2021-09-07 MED ORDER — SIMETHICONE 80 MG PO CHEW: 80.0000 mg | CHEWABLE_TABLET | Freq: Four times a day (QID) | ORAL | 0 refills | Status: DC | PRN

## 2021-09-07 MED ORDER — IBGARD 90 MG PO CPCR: ORAL_CAPSULE | ORAL | 0 refills | Status: DC

## 2021-09-07 MED ORDER — SUTAB 1479-225-188 MG PO TABS: 1.0000 | ORAL_TABLET | ORAL | 0 refills | Status: DC

## 2021-09-07 NOTE — Patient Instructions (Addendum)
_______________________________________________________  If you are age 49 or older, your body mass index should be between 23-30. Your Body mass index is 24.8 kg/m. If this is out of the aforementioned range listed, please consider follow up with your Primary Care Provider.  If you are age 78 or younger, your body mass index should be between 19-25. Your Body mass index is 24.8 kg/m. If this is out of the aformentioned range listed, please consider follow up with your Primary Care Provider.   ________________________________________________________  The [facility] providers would like to encourage you to use [facility] to communicate with providers for non-urgent requests or questions.  Due to long hold times on the telephone, sending your provider a message by [facility] may be a faster and more efficient way to get a response.  Please allow 48 business hours for a response.  Please remember that this is for non-urgent requests.  _______________________________________________________   We are giving you a Low-fodmap diet to follow to help with bloating.  You can try over-the-counter GAS-X.  We have given you samples of the following medication to take: IBGard - Use as directed as needed  You have been scheduled for a colonoscopy. Please follow written instructions given to you at your visit today.  Please pick up your prep supplies at the pharmacy within the next 1-3 days. If you use inhalers (even only as needed), please bring them with you on the day of your procedure.  Please go to the lab in the basement of our building to have lab work done as you leave today. Hit "B" for basement when you get on the elevator.  When the doors open the lab is on your left.  We will call you with the results. Thank you.  Thank you for entrusting me with your care and for choosing [facility], Dr. [place] Cellar

## 2021-09-07 NOTE — Progress Notes (Signed)
HPI :  51 year old female with a history of schizophrenia, diabetes, GERD, referred here by Johnston Ebbs NP for abdominal bloating and distention.  Patient is in the office by herself today.  She states she has had bloating and abdominal distention for "years and years".  She is asking if she can have surgery or some other sort of intervention to reduce her gas and bloating.  She states this is present mostly every day but it can come and go.  Usually gets better at night and then progressively gets worse over the course of the day, perhaps after eating.  She denies any nausea or vomiting.  She eats normally without any early satiety.  She does have some belching at times.  She denies any dysphagia.  She denies any constipation.  No diarrhea.  Moves her bowels regularly without any blood.  She does pass a lot of gas.  She is not in any discomfort or pain.  Her weight has been stable.  It appears she had a CT scan for abdominal bloating back in June 2022.  She was found to have a left sided ovarian cystic lesion and high stool burden, otherwise no other concerning pathology to cause her symptoms.  She ended up having surgery to remove the ovarian lesion, which was benign/nonmalignant.  She denies any new changes to her diet.  She eats a lot of fruits and vegetables at baseline.  No family history of colon cancer.  She has never had a prior colonoscopy before.  We discussed options to manage this.  Again, symptoms appear longstanding and stable over time.  She has had an anemia noted on her last labs in October of last year, has not been followed up yet.  Of note she takes Protonix 40 mg daily for history of GERD.  She states this works well to control her symptoms of reflux, she denies any breakthrough on a routine basis.   CT abdomen pelvis 07/30/20 IMPRESSION: 1. Septated 6.7 cm cystic left ovarian lesion. Because this lesion is not adequately characterized on CT, prompt [place] is recommended  for further evaluation. Note: This recommendation does not apply to premenarchal patients and to those with increased risk (genetic, family history, elevated tumor markers or other high-risk factors) of ovarian cancer. Reference: JACR 2020 Feb; 17(2):248-254 2. Large volume of formed stool throughout the colon suggesting constipation. 3. Punctate nonobstructive right renal stone. 4. 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 5.  Aortic Atherosclerosis (ICD10-I70.0).     Past Medical History:  Diagnosis Date   Asthma    Diabetes mellitus without complication ([place])    GERD (gastroesophageal reflux disease)    History of pulmonary embolism    HLD (hyperlipidemia)    Schizophrenia ([place])      Past Surgical History:  Procedure Laterality Date   BREAST SURGERY Right    ROBOTIC ASSISTED BILATERAL SALPINGO OOPHERECTOMY N/A 11/25/2020   Procedure: XI ROBOTIC ASSISTED RIGHT SALPINGECTOMY, LEFT SALPINGOOPHORECTOMY;  Surgeon: Lafonda Mosses, MD;  Location: [facility];  Service: Gynecology;  Laterality: N/A;   Family History  Problem Relation Age of Onset   Breast cancer Mother    Prostate cancer Father    Mental illness Sister    Social History   Tobacco Use   Smoking status: Never   Smokeless tobacco: Never  Vaping Use   Vaping Use: Never used  Substance Use Topics   Alcohol use: No   Drug use: No   Current Outpatient Medications  Medication Sig Dispense  Refill   acetaminophen (TYLENOL) 325 MG tablet Take 650 mg by mouth every 6 (six) hours as needed for mild pain or headache.     albuterol (VENTOLIN HFA) 108 (90 Base) MCG/ACT inhaler Inhale 2 puffs into the lungs every 4 (four) hours as needed for shortness of breath or wheezing.     Ascorbic Acid (VITAMIN C) 1000 MG tablet Take 1,000 mg by mouth in the morning.     gabapentin (NEURONTIN) 300 MG capsule Take 1 capsule (300 mg total) by mouth 3 (three) times daily. 90 capsule 1   HUMALOG KWIKPEN 100 UNIT/ML KwikPen Inject  0-18 Units into the skin with breakfast, with lunch, and with evening meal. If blood sugar is 190 or up-=18 units  If less than 190=0 units     LANTUS SOLOSTAR 100 UNIT/ML Solostar Pen Inject 14 Units into the skin daily.     naphazoline-pheniramine (ALLERGY EYE) 0.025-0.3 % ophthalmic solution Place 1-2 drops into both eyes 4 (four) times daily as needed for eye irritation.     pantoprazole (PROTONIX) 40 MG tablet Take 40 mg by mouth in the morning.     perphenazine (TRILAFON) 4 MG tablet 1 a day 3 at hs 120 tablet 3   rosuvastatin (CRESTOR) 5 MG tablet Take 5 mg by mouth daily.     ibuprofen (ADVIL) 800 MG tablet Take 1 tablet (800 mg total) by mouth every 8 (eight) hours as needed for moderate pain. For AFTER surgery only (Patient not taking: Reported on 11/18/2020) 30 tablet 0   ondansetron (ZOFRAN) 4 MG tablet Take 1 tablet (4 mg total) by mouth every 8 (eight) hours as needed for nausea or vomiting. (Patient not taking: Reported on 12/10/2020) 5 tablet 0   senna-docusate (SENOKOT-S) 8.6-50 MG tablet Take 2 tablets by mouth at bedtime. For AFTER surgery, do not take if having diarrhea (Patient not taking: Reported on 11/18/2020) 30 tablet 0   traMADol (ULTRAM) 50 MG tablet Take 1 tablet (50 mg total) by mouth every 6 (six) hours as needed for severe pain. For AFTER surgery only, do not take and drive (Patient not taking: Reported on 12/10/2020) 15 tablet 0   No current facility-administered medications for this visit.   No Known Allergies   Review of Systems: All systems reviewed and negative except where noted in HPI.   Lab Results  Component Value Date   WBC 5.6 11/17/2020   HGB 10.1 (L) 11/17/2020   HCT 32.8 (L) 11/17/2020   MCV 100.0 11/17/2020   PLT 453 (H) 11/17/2020    Lab Results  Component Value Date   CREATININE 0.66 11/17/2020   BUN 13 11/17/2020   NA 139 11/17/2020   K 4.3 11/17/2020   CL 109 11/17/2020   CO2 24 11/17/2020    Lab Results  Component Value Date    ALT 16 11/17/2020   AST 21 11/17/2020   ALKPHOS 56 11/17/2020   BILITOT 0.4 11/17/2020     Physical Exam: BP 124/74 (BP Location: Left Arm, Patient Position: Sitting, Cuff Size: Normal)   Pulse (!) 104   Ht '5\' 1"'$  (1.549 m) Comment: height measured without shoes  Wt 131 lb 4 oz (59.5 kg)   LMP 08/29/2021   BMI 24.80 kg/m  Constitutional: Pleasant,well-developed, female in no acute distress. HEENT: Normocephalic and atraumatic. Conjunctivae are normal. No scleral icterus. Neck supple.  Cardiovascular: Normal rate, regular rhythm.  Pulmonary/chest: Effort normal and breath sounds normal.  Abdominal: Soft, protuberant abdomen, mild distension, nontender.  There are no masses palpable. Extremities: no edema Lymphadenopathy: No cervical adenopathy noted. Neurological: Alert and oriented to person place and time. Skin: Skin is warm and dry. No rashes noted. Psychiatric: Normal mood and affect. Behavior is normal.   ASSESSMENT: 51 y.o. female here for assessment of the following  1. Bloating   2. Abdominal distension   3. Anemia, unspecified type   4. Colon cancer screening    History as outlined above.  Patient endorses years and years of abdominal bloating and distention.  She had a CT scan for this issue about a year ago which revealed an ovarian cystic lesion which was removed and turned out to be benign disease.  Given her intermittent nature of this and progressive worsening over the day, she likely has intestinal gas leading to bloating and abdominal distention.  We discussed pathophysiology of intestinal gas and bloating.  I discussed options to treat this.  I counseled her on a low FODMAP diet and provided some handouts to see if there is any foods that are leading to gas production.  She is really not tried anything for this yet, recommend some Gas-X over-the-counter to see if that helps, I will also give her some IBgard samples and she can get this over-the-counter if that  helps.  She is inquiring about a surgery to treat this, I counseled her that she has had prior imaging that has looked okay and there is no surgery recommended for this issue at present time.  Medical therapy is usually effective and can consider other options if her symptoms persist.  She is in need of a colonoscopy for colon cancer screening.  I recommend optical colonoscopy in light of her symptoms, I discussed risk benefits of colonoscopy and anesthesia with her and she agrees to proceed.  She otherwise had anemia on her last set of labs.  I will repeat CBC and TIBC ferritin today to ensure no iron deficiency.  If she is iron deficient we may add an EGD onto the colonoscopy.  She agrees with the plan, all questions answered.   PLAN: - trial of low FODMAP diet, handout provided - trial of Gas-ex OTC - IB gard samples given - lab today for CBC, TIBC / ferritin panel - schedule colonoscopy in the [place] - further recommendations pending her course and results  Jolly Mango, MD [facility]  CC: Johnston Ebbs, NP

## 2021-09-08 ENCOUNTER — Other Ambulatory Visit: Payer: Self-pay

## 2021-09-08 MED ORDER — FERROUS SULFATE 325 (65 FE) MG PO TBEC: 325.0000 mg | DELAYED_RELEASE_TABLET | Freq: Every day | ORAL | 0 refills | Status: DC

## 2021-09-20 ENCOUNTER — Encounter: Payer: Self-pay | Admitting: Certified Registered Nurse Anesthetist

## 2021-09-26 ENCOUNTER — Encounter: Payer: Medicare Other | Admitting: Gastroenterology

## 2021-09-27 ENCOUNTER — Ambulatory Visit (AMBULATORY_SURGERY_CENTER): Payer: Medicare Other | Admitting: Gastroenterology

## 2021-09-27 ENCOUNTER — Encounter: Payer: Self-pay | Admitting: Gastroenterology

## 2021-09-27 VITALS — BP 126/79 | HR 92 | Temp 96.8°F | Resp 14 | Ht 61.0 in | Wt 131.0 lb

## 2021-09-27 DIAGNOSIS — K219 Gastro-esophageal reflux disease without esophagitis: Secondary | ICD-10-CM | POA: Diagnosis not present

## 2021-09-27 DIAGNOSIS — K317 Polyp of stomach and duodenum: Secondary | ICD-10-CM

## 2021-09-27 DIAGNOSIS — D509 Iron deficiency anemia, unspecified: Secondary | ICD-10-CM | POA: Diagnosis not present

## 2021-09-27 DIAGNOSIS — K2970 Gastritis, unspecified, without bleeding: Secondary | ICD-10-CM

## 2021-09-27 DIAGNOSIS — R140 Abdominal distension (gaseous): Secondary | ICD-10-CM

## 2021-09-27 DIAGNOSIS — Z538 Procedure and treatment not carried out for other reasons: Secondary | ICD-10-CM | POA: Diagnosis not present

## 2021-09-27 DIAGNOSIS — K2950 Unspecified chronic gastritis without bleeding: Secondary | ICD-10-CM

## 2021-09-27 DIAGNOSIS — K31A0 Gastric intestinal metaplasia, unspecified: Secondary | ICD-10-CM

## 2021-09-27 DIAGNOSIS — Z1211 Encounter for screening for malignant neoplasm of colon: Secondary | ICD-10-CM

## 2021-09-27 HISTORY — PX: COLONOSCOPY: SHX174

## 2021-09-27 HISTORY — PX: UPPER GASTROINTESTINAL ENDOSCOPY: SHX188

## 2021-09-27 MED ORDER — SODIUM CHLORIDE 0.9 % IV SOLN: 500.0000 mL | Freq: Once | INTRAVENOUS | Status: DC

## 2021-09-27 MED ORDER — OMEPRAZOLE 40 MG PO CPDR: 40.0000 mg | DELAYED_RELEASE_CAPSULE | Freq: Two times a day (BID) | ORAL | 3 refills | Status: DC

## 2021-09-27 MED ORDER — PANTOPRAZOLE SODIUM 40 MG PO TBEC
40.0000 mg | DELAYED_RELEASE_TABLET | Freq: Two times a day (BID) | ORAL | 4 refills | Status: AC
Start: 2021-09-27 — End: ?

## 2021-09-27 NOTE — Progress Notes (Signed)
Report given to PACU, vss 

## 2021-09-27 NOTE — Progress Notes (Signed)
Late entry 1521  Robinul 0.1 mg IV given due large amount of secretions upon assessment.  MD made aware, vss

## 2021-09-27 NOTE — Progress Notes (Signed)
Called to room to assist during endoscopic procedure.  Patient ID and intended procedure confirmed with present staff. Received instructions for my participation in the procedure from the performing physician.  

## 2021-09-27 NOTE — Progress Notes (Signed)
VS completed by DT.  Pt's states no medical or surgical changes since previsit or office visit.  

## 2021-09-27 NOTE — Progress Notes (Signed)
Patient rescheduled for 11/09/2021 @ 9:30 with PV one week before.  Patient will be having double prep next time.   All instructions reviewed with patient's grandfather, Marland Kitchen, [place]

## 2021-09-27 NOTE — Patient Instructions (Addendum)
Thank you for coming in to see [place] today. Resume your normal diet and medications today. Return to your regular activities tomorrow.  We have increased your Pantoprazole ( Protonix) to 40 mg twice per day before meals.  We have rescheduled your colonoscopy for Wednesday November 09, 2021 .  Please arrive at 8:30 am. (4th floor)(  You will have a pre visit the week before on Wednesday September 27,2023 at 11:00 to go over instructions  ( 2nd floor)      YOU HAD AN ENDOSCOPIC PROCEDURE TODAY AT [facility]:   Refer to the procedure report that was given to you for any specific questions about what was found during the examination.  If the procedure report does not answer your questions, please call your gastroenterologist to clarify.  If you requested that your care partner not be given the details of your procedure findings, then the procedure report has been included in a sealed envelope for you to review at your convenience later.  YOU SHOULD EXPECT: Some feelings of bloating in the abdomen. Passage of more gas than usual.  Walking can help get rid of the air that was put into your GI tract during the procedure and reduce the bloating. If you had a lower endoscopy (such as a colonoscopy or flexible sigmoidoscopy) you may notice spotting of blood in your stool or on the toilet paper. If you underwent a bowel prep for your procedure, you may not have a normal bowel movement for a few days.  Please Note:  You might notice some irritation and congestion in your nose or some drainage.  This is from the oxygen used during your procedure.  There is no need for concern and it should clear up in a day or so.  SYMPTOMS TO REPORT IMMEDIATELY:  Following lower endoscopy (colonoscopy or flexible sigmoidoscopy):  Excessive amounts of blood in the stool  Significant tenderness or worsening of abdominal pains  Swelling of the abdomen that is new, acute  Fever of 100F or higher  Following  upper endoscopy (EGD)  Vomiting of blood or coffee ground material  New chest pain or pain under the shoulder blades  Painful or persistently difficult swallowing  New shortness of breath  Fever of 100F or higher  Black, tarry-looking stools  For urgent or emergent issues, a gastroenterologist can be reached at any hour by calling [phone]. Do not use MyChart messaging for urgent concerns.    DIET:  We do recommend a small meal at first, but then you may proceed to your regular diet.  Drink plenty of fluids but you should avoid alcoholic beverages for 24 hours.  ACTIVITY:  You should plan to take it easy for the rest of today and you should NOT DRIVE or use heavy machinery until tomorrow (because of the sedation medicines used during the test).    FOLLOW UP: Our staff will call the number listed on your records the next business day following your procedure.  We will call around 7:15- 8:00 am to check on you and address any questions or concerns that you may have regarding the information given to you following your procedure. If we do not reach you, we will leave a message.  If you develop any symptoms (ie: fever, flu-like symptoms, shortness of breath, cough etc.) before then, please call [phone].  If you test positive for Covid 19 in the 2 weeks post procedure, please call and report this information to [place].  If any biopsies were taken you will be contacted by phone or by letter within the next 1-3 weeks.  Please call us at [phone] if you have not heard about the biopsies in 3 weeks.    SIGNATURES/CONFIDENTIALITY: You and/or your care partner have signed paperwork which will be entered into your electronic medical record.  These signatures attest to the fact that that the information above on your After Visit Summary has been reviewed and is understood.  Full responsibility of the confidentiality of this discharge information lies with you and/or your care-partner.

## 2021-09-27 NOTE — Op Note (Addendum)
[facility] Patient Name: Debbie Dorsey Procedure Date: 09/27/2021 3:04 PM MRN: [id number] Endoscopist: Remo Lipps P. Havery [name] , MD Age: 51 Referring MD:  Date of Birth: 04-Aug-1970 Gender: Female Account #: [id number] Procedure:                Colonoscopy Indications:              Iron deficiency - first colonoscopy, screening Medicines:                Monitored Anesthesia Care Procedure:                Pre-Anesthesia Assessment:                           - Prior to the procedure, a History and Physical                            was performed, and patient medications and                            allergies were reviewed. The patient's tolerance of                            previous anesthesia was also reviewed. The risks                            and benefits of the procedure and the sedation                            options and risks were discussed with the patient.                            All questions were answered, and informed consent                            was obtained. Prior Anticoagulants: The patient has                            taken no previous anticoagulant or antiplatelet                            agents. ASA Grade Assessment: II - A patient with                            mild systemic disease. After reviewing the risks                            and benefits, the patient was deemed in                            satisfactory condition to undergo the procedure.                           After obtaining informed consent, the colonoscope  was passed under direct vision. Throughout the                            procedure, the patient's blood pressure, pulse, and                            oxygen saturations were monitored continuously. The                            PCF-HQ190L Colonoscope was introduced through the                            anus with the intention of advancing to the cecum.                            The  scope was advanced to the transverse colon                            before the procedure was aborted due to prep.                            Medications were given. The colonoscopy was                            performed without difficulty. The patient tolerated                            the procedure well. The quality of the bowel                            preparation was poor. The rectum was photographed. Scope In: 3:35:44 PM Scope Out: 3:39:58 PM Total Procedure Duration: 0 hours 4 minutes 14 seconds  Findings:                 The perianal and digital rectal examinations were                            normal.                           A large amount of semi-liquid stool was found in                            the entire colon, precluding visualization. Lavage                            of the colon was performed using copious amounts,                            resulting in incomplete clearance with continued                            poor visualization. In the transverse colon it  became clear the prep was inadequate and the                            procedure was aborted. Complications:            No immediate complications. Estimated blood loss:                            Minimal. Estimated Blood Loss:     Estimated blood loss was minimal. Impression:               - Preparation of the colon was poor.                           - Stool in the entire examined colon.                           - No specimens collected. Recommendation:           - Patient has a contact number available for                            emergencies. The signs and symptoms of potential                            delayed complications were discussed with the                            patient. Return to normal activities tomorrow.                            Written discharge instructions were provided to the                            patient.                           -  Resume previous diet.                           - Continue present medications.                           - Repeat colonoscopy because the bowel preparation                            was suboptimal. Will discuss options with the                            patient Carlota Raspberry. Havery [name], MD 09/27/2021 3:47:18 PM This report has been signed electronically.

## 2021-09-27 NOTE — Progress Notes (Signed)
History and Physical Interval Note: Seen on 09/07/21 for symptoms as below. Noted to have mild anemia and iron deficient on labs since I have seen her. EGD added to colonoscopy to further evaluate. She feels the same since I have last seen her, no cardiopulmonary symptoms. She wishes to proceed after discussion of risks / benefits of these exams.  09/27/2021 3:22 PM  Debbie Dorsey  has presented today for endoscopic procedure(s), with the diagnosis of  Encounter Diagnoses  Name Primary?   Iron deficiency anemia, unspecified iron deficiency anemia type Yes   Bloating    Abdominal distension    Colon cancer screening   .  The various methods of evaluation and treatment have been discussed with the patient and/or family. After consideration of risks, benefits and other options for treatment, the patient has consented to  the endoscopic procedure(s).   The patient's history has been reviewed, patient examined, no change in status, stable for surgery.  I have reviewed the patient's chart and labs.  Questions were answered to the patient's satisfaction.    Jolly Mango, MD [facility]

## 2021-09-27 NOTE — Op Note (Signed)
[facility] Patient Name: Debbie Dorsey Procedure Date: 09/27/2021 3:14 PM MRN: [id number] Endoscopist: Remo Lipps P. Havery [name] , MD Age: 52 Referring MD:  Date of Birth: December 09, 1970 Gender: Female Account #: [id number] Procedure:                Upper GI endoscopy Indications:              Iron deficiency, Abdominal bloating / distension,                            history of GERD Medicines:                Monitored Anesthesia Care Procedure:                Pre-Anesthesia Assessment:                           - Prior to the procedure, a History and Physical                            was performed, and patient medications and                            allergies were reviewed. The patient's tolerance of                            previous anesthesia was also reviewed. The risks                            and benefits of the procedure and the sedation                            options and risks were discussed with the patient.                            All questions were answered, and informed consent                            was obtained. Prior Anticoagulants: The patient has                            taken no previous anticoagulant or antiplatelet                            agents. ASA Grade Assessment: II - A patient with                            mild systemic disease. After reviewing the risks                            and benefits, the patient was deemed in                            satisfactory condition to undergo the procedure.  After obtaining informed consent, the endoscope was                            passed under direct vision. Throughout the                            procedure, the patient's blood pressure, pulse, and                            oxygen saturations were monitored continuously. The                            GIF HQ190 #[id number] was introduced through the                            mouth, and advanced to the second  part of duodenum.                            The upper GI endoscopy was accomplished without                            difficulty. The patient tolerated the procedure                            well. Scope In: Scope Out: Findings:                 Esophagogastric landmarks were identified: the                            Z-line was found at 37 cm, the gastroesophageal                            junction was found at 37 cm and the upper extent of                            the gastric folds was found at 37 cm from the                            incisors.                           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                           The exam of the esophagus was otherwise normal.                           A single 3 mm sessile whitish colored polypoid was  found in the gastric antrum, perhaps benign                            lymphangioma. The polyp was removed with a cold                            biopsy forceps. Resection and retrieval were                            complete.                           Patchy inflammation characterized by erythema and                            granularity was found in the entire examined                            stomach. Biopsies were taken with a cold forceps                            for Helicobacter pylori testing.                           The exam of the stomach was otherwise normal.                           The examined duodenum was normal. Biopsies for                            histology were taken with a cold forceps for                            evaluation of celiac disease. Complications:            No immediate complications. Estimated blood loss:                            Minimal. Estimated Blood Loss:     Estimated blood loss was minimal. Impression:               - Esophagogastric landmarks identified.                            - Ectopic gastric mucosa in the upper third of the                            esophagus.                           - Normal esophagus otherwise                           - A single gastric polyp. Resected and retrieved.                           -  Gastritis. Biopsied.                           - Normal examined duodenum. Biopsied.                           Possible gastritis could be causing symptoms or                            related to iron deficiency. Recommendation:           - Patient has a contact number available for                            emergencies. The signs and symptoms of potential                            delayed complications were discussed with the                            patient. Return to normal activities tomorrow.                            Written discharge instructions were provided to the                            patient.                           - Resume previous diet.                           - Continue present medications.                           - Increase protonix to '40mg'$  twice daily                           - Avoid NSAID use                           - Await pathology results with further                            recommendations Remo Lipps P. Havery [name], MD 09/27/2021 3:55:51 PM This report has been signed electronically.

## 2021-09-28 ENCOUNTER — Telehealth: Payer: Self-pay | Admitting: *Deleted

## 2021-09-28 NOTE — Telephone Encounter (Signed)
  Follow up Call-     09/27/2021    2:37 PM  Call back number  Post procedure Call Back phone  # [phone]  Permission to leave phone message Yes     Patient questions:  Do you have a fever, pain , or abdominal swelling? No. Pain Score  0 *  Have you tolerated food without any problems? Yes.    Have you been able to return to your normal activities? Yes.    Do you have any questions about your discharge instructions: Diet   No. Medications  No. Follow up visit  No.  Do you have questions or concerns about your Care? No.  Actions: * If pain score is 4 or above: No action needed, pain <4.

## 2021-10-04 ENCOUNTER — Encounter: Payer: Self-pay | Admitting: Gastroenterology

## 2021-11-02 ENCOUNTER — Ambulatory Visit: Payer: Medicare Other | Admitting: Student

## 2021-11-02 ENCOUNTER — Encounter: Payer: Self-pay | Admitting: Gastroenterology

## 2021-11-02 ENCOUNTER — Ambulatory Visit (AMBULATORY_SURGERY_CENTER): Payer: Self-pay | Admitting: *Deleted

## 2021-11-02 VITALS — Ht 61.0 in | Wt 134.0 lb

## 2021-11-02 DIAGNOSIS — Z1211 Encounter for screening for malignant neoplasm of colon: Secondary | ICD-10-CM

## 2021-11-02 MED ORDER — NA SULFATE-K SULFATE-MG SULF 17.5-3.13-1.6 GM/177ML PO SOLN: 1.0000 | ORAL | 0 refills | Status: DC

## 2021-11-02 NOTE — Progress Notes (Signed)
Patient and Suezanne Jacquet pt's grandfather is here in-person for PV. Patient denies any allergies to eggs or soy. Patient denies any problems with anesthesia/sedation. Patient is not on any oxygen at home. Patient is not taking any diet/weight loss medications or blood thinners. Went over procedure prep instructions with the patient and grandfather. Patient is aware of our care-partner policy. Patient denies any medical hx changes since last GI visit.

## 2021-11-09 ENCOUNTER — Encounter: Payer: Self-pay | Admitting: Gastroenterology

## 2021-11-09 ENCOUNTER — Ambulatory Visit (AMBULATORY_SURGERY_CENTER): Payer: Medicare Other | Admitting: Gastroenterology

## 2021-11-09 VITALS — BP 128/73 | HR 88 | Temp 96.2°F | Resp 12 | Ht 61.0 in | Wt 134.0 lb

## 2021-11-09 DIAGNOSIS — K648 Other hemorrhoids: Secondary | ICD-10-CM

## 2021-11-09 DIAGNOSIS — E611 Iron deficiency: Secondary | ICD-10-CM | POA: Diagnosis not present

## 2021-11-09 DIAGNOSIS — D124 Benign neoplasm of descending colon: Secondary | ICD-10-CM

## 2021-11-09 MED ORDER — SODIUM CHLORIDE 0.9 % IV SOLN: 500.0000 mL | Freq: Once | INTRAVENOUS | Status: DC

## 2021-11-09 NOTE — Patient Instructions (Signed)
Please read handouts provided. Continue present medications. Await pathology results. Continue oral iron supplementation and trend labs.   YOU HAD AN ENDOSCOPIC PROCEDURE TODAY AT [facility]:   Refer to the procedure report that was given to you for any specific questions about what was found during the examination.  If the procedure report does not answer your questions, please call your gastroenterologist to clarify.  If you requested that your care partner not be given the details of your procedure findings, then the procedure report has been included in a sealed envelope for you to review at your convenience later.  YOU SHOULD EXPECT: Some feelings of bloating in the abdomen. Passage of more gas than usual.  Walking can help get rid of the air that was put into your GI tract during the procedure and reduce the bloating. If you had a lower endoscopy (such as a colonoscopy or flexible sigmoidoscopy) you may notice spotting of blood in your stool or on the toilet paper. If you underwent a bowel prep for your procedure, you may not have a normal bowel movement for a few days.  Please Note:  You might notice some irritation and congestion in your nose or some drainage.  This is from the oxygen used during your procedure.  There is no need for concern and it should clear up in a day or so.  SYMPTOMS TO REPORT IMMEDIATELY:  Following lower endoscopy (colonoscopy or flexible sigmoidoscopy):  Excessive amounts of blood in the stool  Significant tenderness or worsening of abdominal pains  Swelling of the abdomen that is new, acute  Fever of 100F or higher.  For urgent or emergent issues, a gastroenterologist can be reached at any hour by calling [phone]. Do not use MyChart messaging for urgent concerns.    DIET:  We do recommend a small meal at first, but then you may proceed to your regular diet.  Drink plenty of fluids but you should avoid alcoholic beverages for 24  hours.  ACTIVITY:  You should plan to take it easy for the rest of today and you should NOT DRIVE or use heavy machinery until tomorrow (because of the sedation medicines used during the test).    FOLLOW UP: Our staff will call the number listed on your records the next business day following your procedure.  We will call around 7:15- 8:00 am to check on you and address any questions or concerns that you may have regarding the information given to you following your procedure. If we do not reach you, we will leave a message.     If any biopsies were taken you will be contacted by phone or by letter within the next 1-3 weeks.  Please call us at [phone] if you have not heard about the biopsies in 3 weeks.    SIGNATURES/CONFIDENTIALITY: You and/or your care partner have signed paperwork which will be entered into your electronic medical record.  These signatures attest to the fact that that the information above on your After Visit Summary has been reviewed and is understood.  Full responsibility of the confidentiality of this discharge information lies with you and/or your care-partner.

## 2021-11-09 NOTE — Progress Notes (Signed)
[facility] History and Physical   Primary Care Physician:  Prince Solian, MD   Reason for Procedure:   Iron deficiency, first time colonoscopy  Plan:    Colonoscopy      HPI: Debbie Dorsey is a 51 y.o. female  here for colonoscopy - , history of iron deficiency. Last exam in August limited by poor prep which was her first exam. Double prep for this exam. No family history of colon cancer known. Otherwise feels well without any cardiopulmonary symptoms.   I have discussed risks / benefits of anesthesia and endoscopic procedure with Ennis Forts and they wish to proceed with the exams as outlined today.    Past Medical History:  Diagnosis Date   Asthma    Diabetes mellitus without complication ([place])    GERD (gastroesophageal reflux disease)    History of pulmonary embolism    HLD (hyperlipidemia)    Schizophrenia ([place])     Past Surgical History:  Procedure Laterality Date   BREAST SURGERY Right    COLONOSCOPY  09/27/2021   Dr.Joslyne Marshburn   ROBOTIC ASSISTED BILATERAL SALPINGO OOPHERECTOMY N/A 11/25/2020   Procedure: XI ROBOTIC ASSISTED RIGHT SALPINGECTOMY, LEFT SALPINGOOPHORECTOMY;  Surgeon: Lafonda Mosses, MD;  Location: [facility];  Service: Gynecology;  Laterality: N/A;   UPPER GASTROINTESTINAL ENDOSCOPY  09/27/2021   Dr.Layal Javid    Prior to Admission medications   Medication Sig Start Date End Date Taking? Authorizing Provider  acetaminophen (TYLENOL) 325 MG tablet Take 650 mg by mouth every 6 (six) hours as needed for mild pain or headache.   Yes [provider]  Ascorbic Acid (VITAMIN C) 1000 MG tablet Take 1,000 mg by mouth in the morning.   Yes [provider]  gabapentin (NEURONTIN) 300 MG capsule Take 1 capsule (300 mg total) by mouth 3 (three) times daily. 08/28/18  Yes Johnn Hai, MD  HUMALOG KWIKPEN 100 UNIT/ML KwikPen Inject 20 Units into the skin with breakfast, with lunch, and with evening meal. If blood sugar is  190 or up-=18 units  If less than 190=0 units 10/14/20  Yes [provider]  albuterol (VENTOLIN HFA) 108 (90 Base) MCG/ACT inhaler Inhale 2 puffs into the lungs every 4 (four) hours as needed for shortness of breath or wheezing. 11/01/18   [provider]  LANTUS SOLOSTAR 100 UNIT/ML Solostar Pen Inject 25 Units into the skin daily. 08/11/20   [provider]  naphazoline-pheniramine (ALLERGY EYE) 0.025-0.3 % ophthalmic solution Place 1-2 drops into both eyes 4 (four) times daily as needed for eye irritation.    [provider]  pantoprazole (PROTONIX) 40 MG tablet Take 1 tablet (40 mg total) by mouth 2 (two) times daily before a meal. 09/27/21   Yatzil Clippinger, Carlota Raspberry, MD  Peppermint Oil (IBGARD) 90 MG CPCR Use as directed as needed Patient not taking: Reported on 11/09/2021 09/07/21   Yetta Flock, MD  perphenazine (TRILAFON) 4 MG tablet 1 a day 3 at hs Patient not taking: Reported on 11/09/2021 12/06/18   Johnn Hai, MD  rosuvastatin (CRESTOR) 5 MG tablet Take 5 mg by mouth daily. 09/02/20   [provider]  senna-docusate (SENOKOT-S) 8.6-50 MG tablet Take 2 tablets by mouth at bedtime. For AFTER surgery, do not take if having diarrhea 11/17/20   Joylene John D, NP  simethicone (GAS-X) 80 MG chewable tablet Chew 1 tablet (80 mg total) by mouth every 6 (six) hours as needed for flatulence. 09/07/21   Quadasia Newsham, Carlota Raspberry, MD  Current Outpatient Medications  Medication Sig Dispense Refill   acetaminophen (TYLENOL) 325 MG tablet Take 650 mg by mouth every 6 (six) hours as needed for mild pain or headache.     Ascorbic Acid (VITAMIN C) 1000 MG tablet Take 1,000 mg by mouth in the morning.     gabapentin (NEURONTIN) 300 MG capsule Take 1 capsule (300 mg total) by mouth 3 (three) times daily. 90 capsule 1   HUMALOG KWIKPEN 100 UNIT/ML KwikPen Inject 20 Units into the skin with breakfast, with lunch, and with evening meal. If blood sugar is 190 or up-=18  units  If less than 190=0 units     albuterol (VENTOLIN HFA) 108 (90 Base) MCG/ACT inhaler Inhale 2 puffs into the lungs every 4 (four) hours as needed for shortness of breath or wheezing.     LANTUS SOLOSTAR 100 UNIT/ML Solostar Pen Inject 25 Units into the skin daily.     naphazoline-pheniramine (ALLERGY EYE) 0.025-0.3 % ophthalmic solution Place 1-2 drops into both eyes 4 (four) times daily as needed for eye irritation.     pantoprazole (PROTONIX) 40 MG tablet Take 1 tablet (40 mg total) by mouth 2 (two) times daily before a meal. 60 tablet 4   Peppermint Oil (IBGARD) 90 MG CPCR Use as directed as needed (Patient not taking: Reported on 11/09/2021) 12 capsule 0   perphenazine (TRILAFON) 4 MG tablet 1 a day 3 at hs (Patient not taking: Reported on 11/09/2021) 120 tablet 3   rosuvastatin (CRESTOR) 5 MG tablet Take 5 mg by mouth daily.     senna-docusate (SENOKOT-S) 8.6-50 MG tablet Take 2 tablets by mouth at bedtime. For AFTER surgery, do not take if having diarrhea 30 tablet 0   simethicone (GAS-X) 80 MG chewable tablet Chew 1 tablet (80 mg total) by mouth every 6 (six) hours as needed for flatulence. 30 tablet 0   Current Facility-Administered Medications  Medication Dose Route Frequency Provider Last Rate Last Admin   0.9 %  sodium chloride infusion  500 mL Intravenous Once Melitta Tigue, Carlota Raspberry, MD        Allergies as of 11/09/2021   (No Known Allergies)    Family History  Problem Relation Age of Onset   Breast cancer Mother    Prostate cancer Father    Mental illness Sister    Colon cancer Neg Hx    Esophageal cancer Neg Hx    Rectal cancer Neg Hx    Stomach cancer Neg Hx     Social History   Socioeconomic History   Marital status: Single    Spouse name: Not on file   Number of children: 1   Years of education: Not on file   Highest education level: Not on file  Occupational History   Not on file  Tobacco Use   Smoking status: Never   Smokeless tobacco: Never  Vaping  Use   Vaping Use: Never used  Substance and Sexual Activity   Alcohol use: No   Drug use: No   Sexual activity: Not Currently  Other Topics Concern   Not on file  Social History Narrative   Not on file   Social Determinants of Health   Financial Resource Strain: Not on file  Food Insecurity: Not on file  Transportation Needs: Not on file  Physical Activity: Not on file  Stress: Not on file  Social Connections: Not on file  Intimate Partner Violence: Not on file    Review of Systems: All other review  of systems negative except as mentioned in the HPI.  Physical Exam: Vital signs BP 114/63   Pulse 86   Temp (!) 96.2 F (35.7 C)   Ht '5\' 1"'$  (1.549 m)   Wt 134 lb (60.8 kg)   LMP 11/07/2021 Comment: currently having menstual period,pt says has not been sexually avtive for over one year  SpO2 98%   BMI 25.32 kg/m   General:   Alert,  Well-developed, pleasant and cooperative in NAD Lungs:  Clear throughout to auscultation.   Heart:  Regular rate and rhythm Abdomen:  Soft, nontender and nondistended.   Neuro/Psych:  Alert and cooperative. Normal mood and affect. A and O x 3  Jolly Mango, MD [facility]

## 2021-11-09 NOTE — Progress Notes (Signed)
A and O x3. Report to RN. Tolerated MAC anesthesia well. 

## 2021-11-09 NOTE — Progress Notes (Signed)
Pt's states no medical or surgical changes since previsit or office visit.   Urine pregnancy test done by Butch  T  as ok'd by Dr Havery [name] after pt says she is here because abdomen is getting big . Pt asked when is her stomach going to go down. Urine pregnancy test negative

## 2021-11-09 NOTE — Progress Notes (Signed)
Called to room to assist during endoscopic procedure.  Patient ID and intended procedure confirmed with present staff. Received instructions for my participation in the procedure from the performing physician.  

## 2021-11-09 NOTE — Op Note (Signed)
Debbie Dorsey Procedure Date: 11/09/2021 10:02 AM MRN: [id number] Endoscopist: Remo Lipps P. Havery Debbie Dorsey , MD Age: 51 Referring MD:  Date of Birth: 31-May-1970 Gender: Female Account #: [id number] Procedure:                Colonoscopy Indications:              Iron deficiency anemia, first colonoscopy for                            screening - EGD negative for cause of iron                            deficiency. Last colonoscopy limited by poor prep                            In August Medicines:                Monitored Anesthesia Care Procedure:                Pre-Anesthesia Assessment:                           - Prior to the procedure, a History and Physical                            was performed, and patient medications and                            allergies were reviewed. The patient's tolerance of                            previous anesthesia was also reviewed. The risks                            and benefits of the procedure and the sedation                            options and risks were discussed with the patient.                            All questions were answered, and informed consent                            was obtained. Prior Anticoagulants: The patient has                            taken no previous anticoagulant or antiplatelet                            agents. ASA Grade Assessment: III - A patient with                            severe systemic disease. After reviewing the risks  and benefits, the patient was deemed in                            satisfactory condition to undergo the procedure.                           After obtaining informed consent, the colonoscope                            was passed under direct vision. Throughout the                            procedure, the patient's blood pressure, pulse, and                            oxygen saturations were monitored continuously. The                             PCF-HQ190L Colonoscope was introduced through the                            anus and advanced to the the terminal ileum, with                            identification of the appendiceal orifice and IC                            valve. The colonoscopy was performed without                            difficulty. The patient tolerated the procedure                            well. The quality of the bowel preparation was                            good. The terminal ileum, ileocecal valve,                            appendiceal orifice, and rectum were photographed. Scope In: 10:12:40 AM Scope Out: 10:33:35 AM Scope Withdrawal Time: 0 hours 14 minutes 7 seconds  Total Procedure Duration: 0 hours 20 minutes 55 seconds  Findings:                 The perianal and digital rectal examinations were                            normal.                           The terminal ileum appeared normal.                           The colon was tortuous.  A 3 mm polyp was found in the descending colon. The                            polyp was sessile. The polyp was removed with a                            cold snare. Resection and retrieval were complete.                           Internal hemorrhoids were found during                            retroflexion. The hemorrhoids were small.                           The exam was otherwise without abnormality. Complications:            No immediate complications. Estimated blood loss:                            Minimal. Estimated Blood Loss:     Estimated blood loss was minimal. Impression:               - The examined portion of the ileum was normal.                           - Tortuous colon.                           - One 3 mm polyp in the descending colon, removed                            with a cold snare. Resected and retrieved.                           - Internal hemorrhoids.                            - The examination was otherwise normal.                           No cause for iron deficiency on colonoscopy, which                            could be due to menstrual blood loss. Recommendation:           - Patient has a contact number available for                            emergencies. The signs and symptoms of potential                            delayed complications were discussed with the  patient. Return to normal activities tomorrow.                            Written discharge instructions were provided to the                            patient.                           - Resume previous diet.                           - Continue present medications.                           - Await pathology results.                           - Continue oral iron supplementation and trend labs [employer]. [name], MD 11/09/2021 10:39:18 AM This report has been signed electronically.

## 2021-11-10 ENCOUNTER — Telehealth: Payer: Self-pay

## 2021-11-10 NOTE — Telephone Encounter (Signed)
  Follow up Call-     11/09/2021    9:01 AM 09/27/2021    2:37 PM  Call back number  Post procedure Call Back phone  # [phone]  Permission to leave phone message Yes Yes     Patient questions:  Do you have a fever, pain , or abdominal swelling? No. Pain Score  0 *  Have you tolerated food without any problems? Yes.    Have you been able to return to your normal activities? Yes.    Do you have any questions about your discharge instructions: Diet   No. Medications  No. Follow up visit  No.  Do you have questions or concerns about your Care? No.  Actions: * If pain score is 4 or above: No action needed, pain <4.

## 2021-11-14 ENCOUNTER — Other Ambulatory Visit: Payer: Self-pay

## 2021-11-14 DIAGNOSIS — D649 Anemia, unspecified: Secondary | ICD-10-CM

## 2021-11-14 DIAGNOSIS — E611 Iron deficiency: Secondary | ICD-10-CM

## 2021-11-29 ENCOUNTER — Telehealth: Payer: Self-pay

## 2021-11-29 NOTE — Telephone Encounter (Signed)
-----   Message from Yevette Edwards, RN sent at 11/14/2021 11:25 AM EDT ----- Regarding: Labs CBC, IBC + Ferritin - orders in epic

## 2021-11-29 NOTE — Telephone Encounter (Signed)
Spoke with patient to remind her that she is due for repeat CBC and iron studies at this time. No appointment is necessary. Patient is aware that she can stop by the lab in the basement at her convenience between 7:30 AM - 5 PM, Monday through Friday. Patient verbalized understanding and had no concerns at the end of the call.

## 2021-12-05 ENCOUNTER — Other Ambulatory Visit (INDEPENDENT_AMBULATORY_CARE_PROVIDER_SITE_OTHER): Payer: Medicare Other

## 2021-12-05 DIAGNOSIS — D649 Anemia, unspecified: Secondary | ICD-10-CM

## 2021-12-05 DIAGNOSIS — E611 Iron deficiency: Secondary | ICD-10-CM | POA: Diagnosis not present

## 2021-12-05 LAB — CBC WITH DIFFERENTIAL/PLATELET
Basophils Absolute: 0 10*3/uL (ref 0.0–0.1)
Basophils Relative: 0.7 % (ref 0.0–3.0)
Eosinophils Absolute: 0.3 10*3/uL (ref 0.0–0.7)
Eosinophils Relative: 4.2 % (ref 0.0–5.0)
HCT: 34.2 % — ABNORMAL LOW (ref 36.0–46.0)
Hemoglobin: 11.7 g/dL — ABNORMAL LOW (ref 12.0–15.0)
Lymphocytes Relative: 32.1 % (ref 12.0–46.0)
Lymphs Abs: 2 10*3/uL (ref 0.7–4.0)
MCHC: 34.1 g/dL (ref 30.0–36.0)
MCV: 98.6 fl (ref 78.0–100.0)
Monocytes Absolute: 0.4 10*3/uL (ref 0.1–1.0)
Monocytes Relative: 6.6 % (ref 3.0–12.0)
Neutro Abs: 3.6 10*3/uL (ref 1.4–7.7)
Neutrophils Relative %: 56.4 % (ref 43.0–77.0)
Platelets: 340 10*3/uL (ref 150.0–400.0)
RBC: 3.47 Mil/uL — ABNORMAL LOW (ref 3.87–5.11)
RDW: 13 % (ref 11.5–15.5)
WBC: 6.3 10*3/uL (ref 4.0–10.5)

## 2021-12-05 LAB — IBC + FERRITIN
Ferritin: 26.5 ng/mL (ref 10.0–291.0)
Iron: 113 ug/dL (ref 42–145)
Saturation Ratios: 32.2 % (ref 20.0–50.0)
TIBC: 351.4 ug/dL (ref 250.0–450.0)
Transferrin: 251 mg/dL (ref 212.0–360.0)

## 2024-01-03 IMAGING — US US PELVIS COMPLETE WITH TRANSVAGINAL
1 series · 13 of 25 positions shown · non-contrast
Comparison: Prior ultrasound from 08/05/2020.

CLINICAL DATA: Initial evaluation for abnormal uterine bleeding.
Prior left oophorectomy.

EXAM:
TRANSABDOMINAL AND TRANSVAGINAL ULTRASOUND OF PELVIS
TECHNIQUE: Both transabdominal and transvaginal ultrasound examinations of the
pelvis were performed. Transabdominal technique was performed for
global imaging of the pelvis including uterus, ovaries, adnexal
regions, and pelvic cul-de-sac. It was necessary to proceed with
endovaginal exam following the transabdominal exam to visualize the
endometrium and ovaries.

[Series 1: us pelvic complete with transvaginal · 105 acquisitions, 13 frames shown]
[im 1/105]
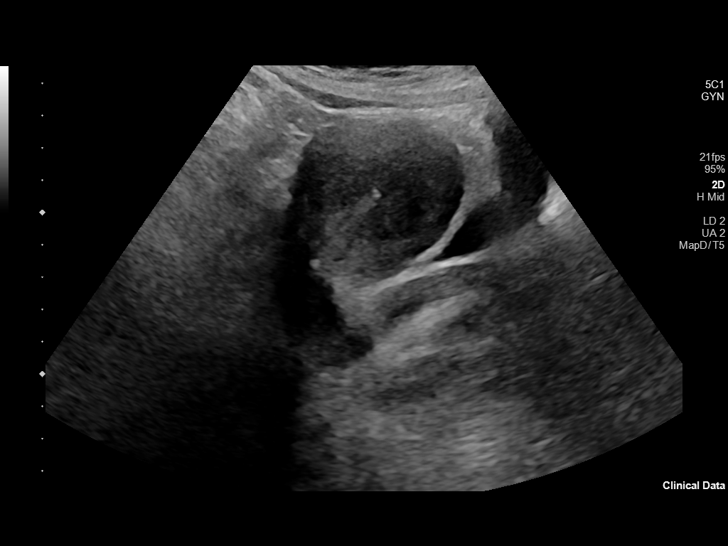
[im 9/105]
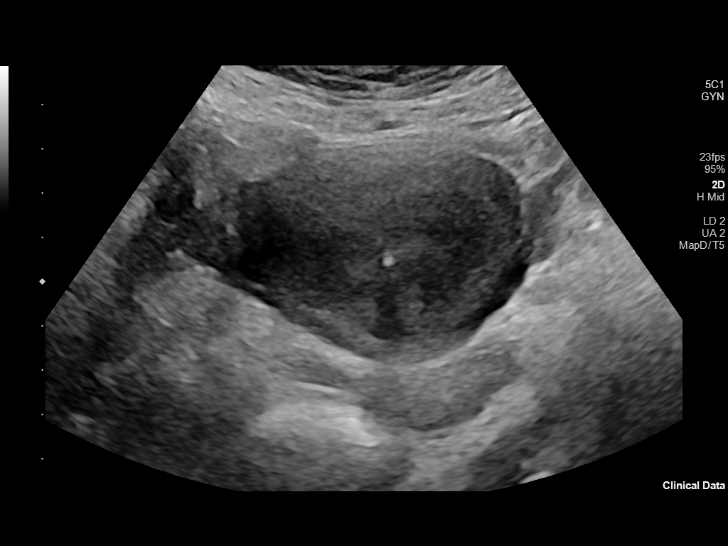
[im 18/105]
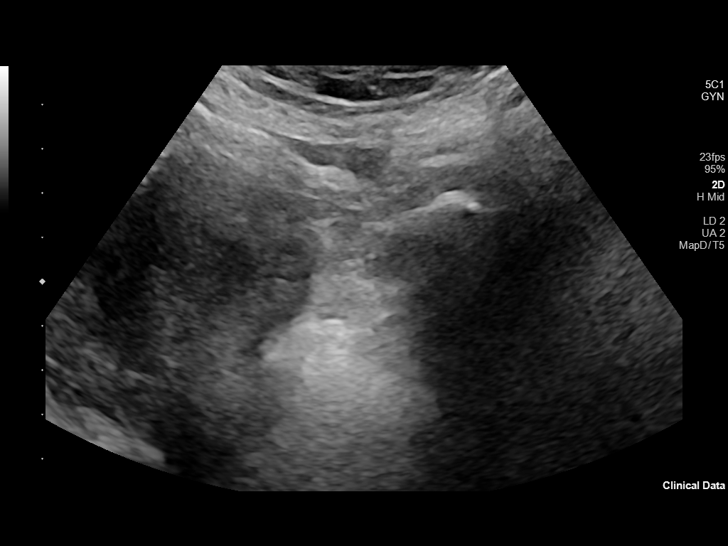
[im 27/105]
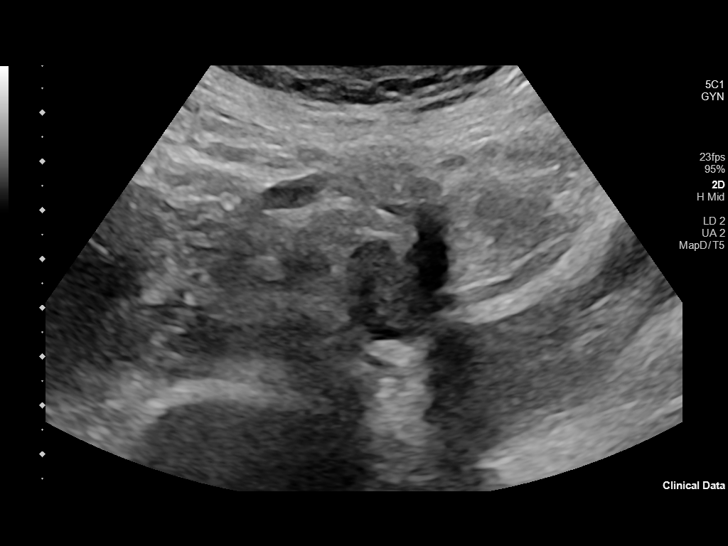
[im 35/105]
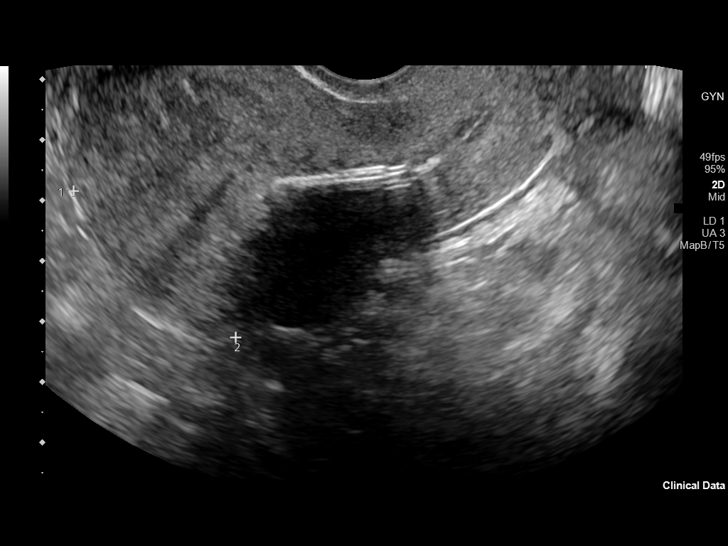
[im 44/105]
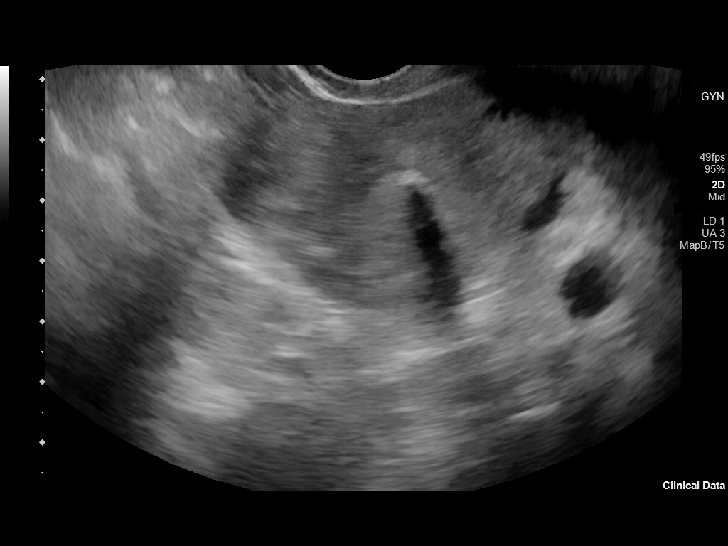
[im 53/105]
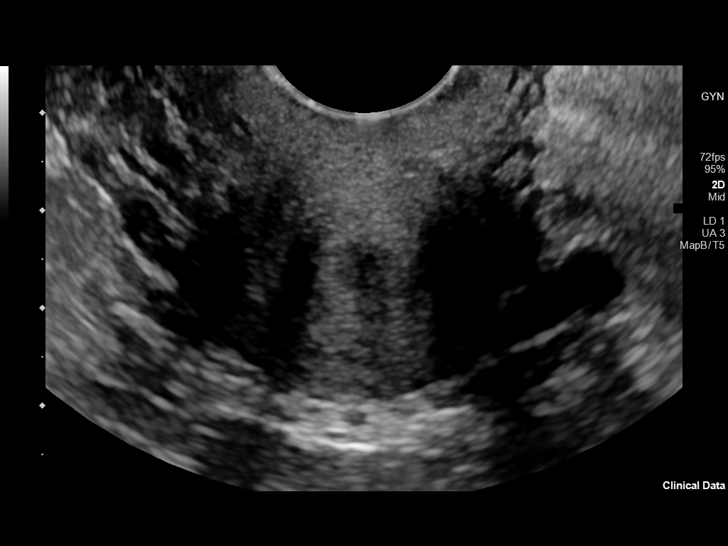
[im 61/105]
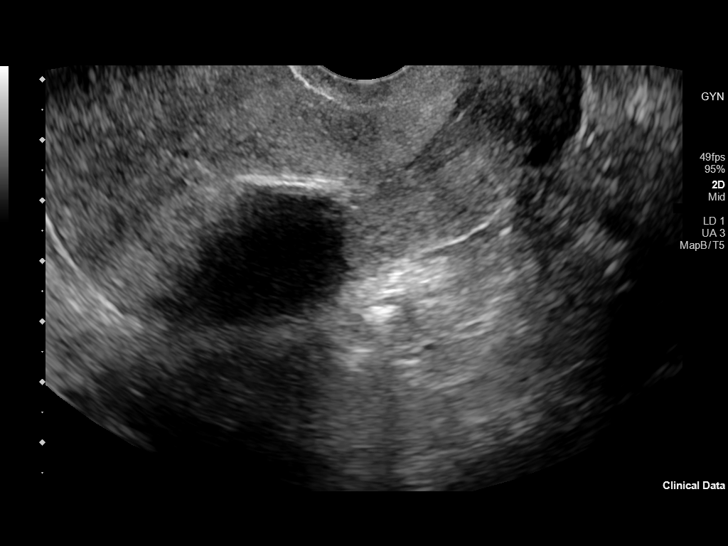
[im 70/105]
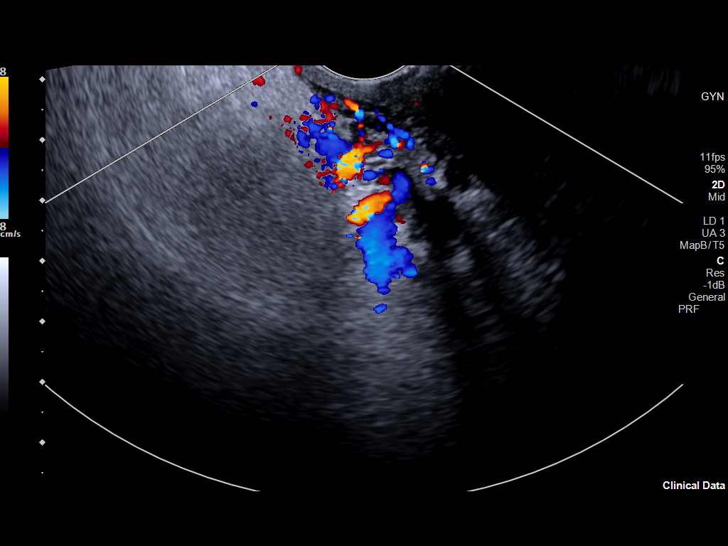
[im 79/105]
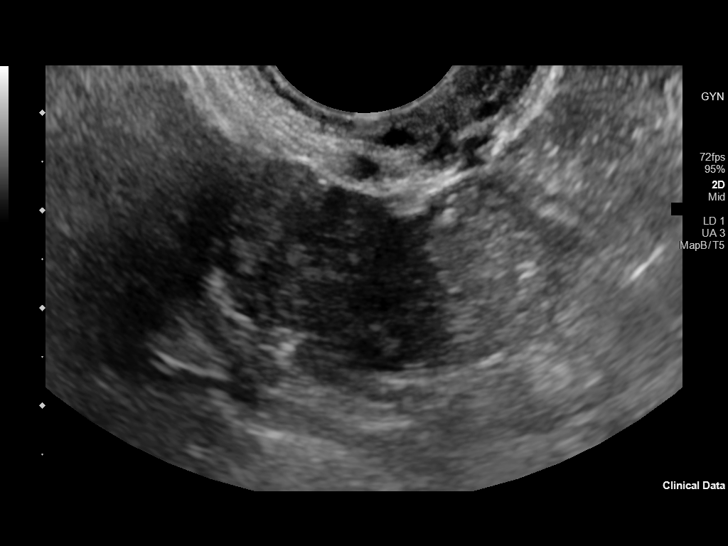
[im 87/105]
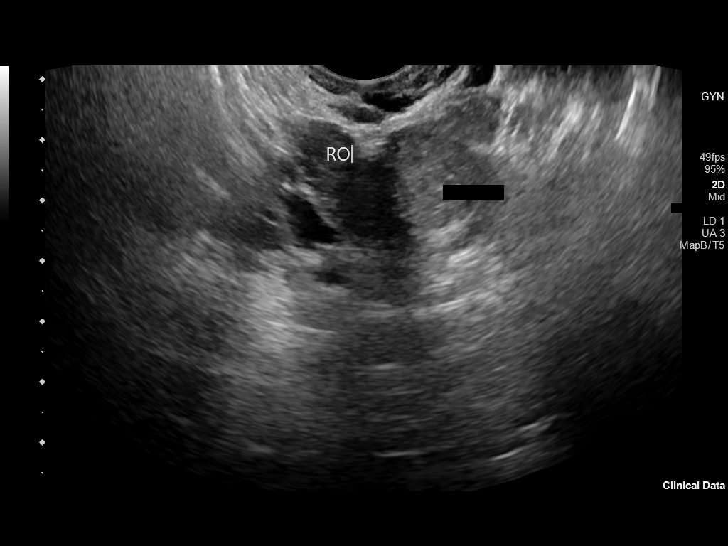
[im 96/105]
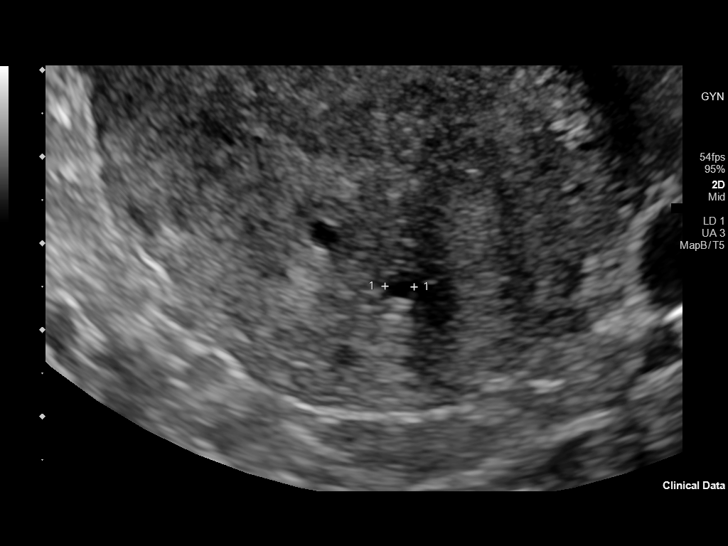
[im 105/105]
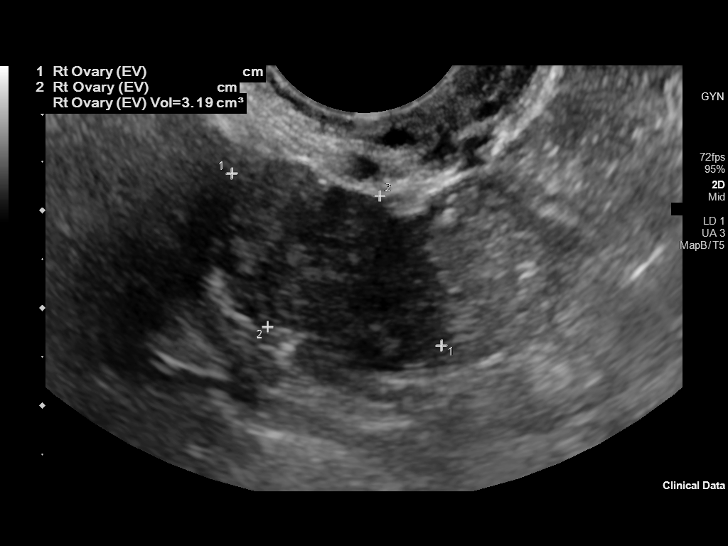

[13 of 25 positions shown; findings below may reference images not displayed]

FINDINGS: Uterus

Measurements: 9.7 x 4.9 x 6.1 cm = volume: 153.1 mL. Uterus is
anteverted. Heterogeneous echotexture seen throughout the uterine
myometrium with poor definition of the endometrial-myometrial
interface, with a few scattered subcentimeter cystic foci,
suggesting adenomyosis. No discrete fibroid by sonography. Small
nabothian cyst noted at the cervix.

Endometrium

Thickness: 3 mm. No focal abnormality visualized. IUD in appropriate
position within the endometrial cavity.

Right ovary

Measurements: 2.8 x 1.8 x 1.2 cm = volume: 3.2 mL. Normal
appearance/no adnexal mass.

Left ovary

Not visualized, reportedly surgically absent.  No adnexal mass.

Other findings

No abnormal free fluid.
IMPRESSION: 1. Heterogeneous echotexture throughout the uterine myometrium with
poor definition of the endometrial-myometrial interface, suggesting
adenomyosis. No discrete fibroids by sonography.
2. Endometrial stripe measures 3 mm in thickness. If bleeding
remains unresponsive to hormonal or medical therapy, sonohysterogram
should be considered for focal lesion work-up. (Ref: Radiological
Reasoning: Algorithmic Workup of Abnormal Vaginal Bleeding with
Endovaginal Sonography and Sonohysterography. AJR 7111; 191:S68-73).
3. IUD in appropriate position within the endometrial cavity.
4. Normal right ovary, with nonvisualization of the left ovary
(reportedly surgically absent). No adnexal mass or free fluid.

## 2024-02-10 ENCOUNTER — Emergency Department (HOSPITAL_COMMUNITY)
Admission: EM | Admit: 2024-02-10 | Discharge: 2024-02-10 | Disposition: A | Discharge status: Home / Self Care | Attending: Emergency Medicine | Admitting: Emergency Medicine

## 2024-02-10 ENCOUNTER — Emergency Department (HOSPITAL_COMMUNITY)

## 2024-02-10 ENCOUNTER — Other Ambulatory Visit: Payer: Self-pay

## 2024-02-10 DIAGNOSIS — Z794 Long term (current) use of insulin: Secondary | ICD-10-CM | POA: Insufficient documentation

## 2024-02-10 DIAGNOSIS — R000 Tachycardia, unspecified: Secondary | ICD-10-CM | POA: Diagnosis not present

## 2024-02-10 DIAGNOSIS — B349 Viral infection, unspecified: Secondary | ICD-10-CM | POA: Insufficient documentation

## 2024-02-10 DIAGNOSIS — Z79899 Other long term (current) drug therapy: Secondary | ICD-10-CM | POA: Insufficient documentation

## 2024-02-10 DIAGNOSIS — R058 Other specified cough: Secondary | ICD-10-CM | POA: Diagnosis present

## 2024-02-10 DIAGNOSIS — R509 Fever, unspecified: Secondary | ICD-10-CM

## 2024-02-10 LAB — CBC WITH DIFFERENTIAL/PLATELET
Abs Immature Granulocytes: 0.01 K/uL (ref 0.00–0.07)
Basophils Absolute: 0 K/uL (ref 0.0–0.1)
Basophils Relative: 0 %
Eosinophils Absolute: 0 K/uL (ref 0.0–0.5)
Eosinophils Relative: 0 %
HCT: 41.8 % (ref 36.0–46.0)
Hemoglobin: 14 g/dL (ref 12.0–15.0)
Immature Granulocytes: 0 %
Lymphocytes Relative: 20 %
Lymphs Abs: 0.9 K/uL (ref 0.7–4.0)
MCH: 32.1 pg (ref 26.0–34.0)
MCHC: 33.5 g/dL (ref 30.0–36.0)
MCV: 95.9 fL (ref 80.0–100.0)
Monocytes Absolute: 0.4 K/uL (ref 0.1–1.0)
Monocytes Relative: 8 %
Neutro Abs: 3.4 K/uL (ref 1.7–7.7)
Neutrophils Relative %: 72 %
Platelets: 326 K/uL (ref 150–400)
RBC: 4.36 MIL/uL (ref 3.87–5.11)
RDW: 13.2 % (ref 11.5–15.5)
WBC: 4.8 K/uL (ref 4.0–10.5)
nRBC: 0 % (ref 0.0–0.2)

## 2024-02-10 LAB — RESP PANEL BY RT-PCR (RSV, FLU A&B, COVID)  RVPGX2
Influenza A by PCR: NEGATIVE
Influenza B by PCR: NEGATIVE
Resp Syncytial Virus by PCR: NEGATIVE
SARS Coronavirus 2 by RT PCR: NEGATIVE

## 2024-02-10 LAB — URINALYSIS, W/ REFLEX TO CULTURE (INFECTION SUSPECTED)
Bacteria, UA: NONE SEEN
Bilirubin Urine: NEGATIVE
Glucose, UA: >=500 mg/dL — AB
Ketones, ur: 5 mg/dL — AB
Leukocytes,Ua: NEGATIVE
Nitrite: NEGATIVE
Protein, ur: 100 mg/dL — AB
Specific Gravity, Urine: 1.029 (ref 1.005–1.030)
pH: 5 (ref 5.0–8.0)

## 2024-02-10 LAB — COMPREHENSIVE METABOLIC PANEL WITH GFR
ALT: 66 U/L — ABNORMAL HIGH (ref 0–44)
AST: 112 U/L — ABNORMAL HIGH (ref 15–41)
Albumin: 4.5 g/dL (ref 3.5–5.0)
Alkaline Phosphatase: 62 U/L (ref 38–126)
Anion gap: 14 (ref 5–15)
BUN: 13 mg/dL (ref 6–20)
CO2: 19 mmol/L — ABNORMAL LOW (ref 22–32)
Calcium: 9.7 mg/dL (ref 8.9–10.3)
Chloride: 103 mmol/L (ref 98–111)
Creatinine, Ser: 1.09 mg/dL — ABNORMAL HIGH (ref 0.44–1.00)
GFR, Estimated: >60 mL/min
Glucose, Bld: 297 mg/dL — ABNORMAL HIGH (ref 70–99)
Potassium: 4.6 mmol/L (ref 3.5–5.1)
Sodium: 137 mmol/L (ref 135–145)
Total Bilirubin: 0.5 mg/dL (ref 0.0–1.2)
Total Protein: 8.1 g/dL (ref 6.5–8.1)

## 2024-02-10 LAB — PROTIME-INR
INR: 1 (ref 0.8–1.2)
Prothrombin Time: 13.3 s (ref 11.4–15.2)

## 2024-02-10 LAB — I-STAT CG4 LACTIC ACID, ED
Lactic Acid, Venous: 3.7 mmol/L (ref 0.5–1.9)
Lactic Acid, Venous: 1.4 mmol/L (ref 0.5–1.9)

## 2024-02-10 LAB — CBG MONITORING, ED: Glucose-Capillary: 246 mg/dL — ABNORMAL HIGH (ref 70–99)

## 2024-02-10 MED ORDER — ACETAMINOPHEN 325 MG PO TABS: 650.0000 mg | ORAL_TABLET | Freq: Once | ORAL | Status: DC | PRN

## 2024-02-10 MED ORDER — SODIUM CHLORIDE 0.9 % IV SOLN
2.0000 g | Freq: Once | INTRAVENOUS | Status: DC
  Filled 2024-02-10: qty 20

## 2024-02-10 MED ORDER — ACETAMINOPHEN 500 MG PO TABS
1000.0000 mg | ORAL_TABLET | Freq: Once | ORAL | Status: AC
  Administered 2024-02-10: 1000 mg via ORAL
  Filled 2024-02-10: qty 2

## 2024-02-10 MED ORDER — SODIUM CHLORIDE 0.9 % IV SOLN: 1.0000 g | Freq: Once | INTRAVENOUS | Status: DC

## 2024-02-10 MED ORDER — IBUPROFEN 200 MG PO TABS
600.0000 mg | ORAL_TABLET | Freq: Once | ORAL | Status: AC
  Administered 2024-02-10: 600 mg via ORAL
  Filled 2024-02-10: qty 3

## 2024-02-10 MED ORDER — SODIUM CHLORIDE 0.9 % IV SOLN
500.0000 mg | Freq: Once | INTRAVENOUS | Status: AC
  Administered 2024-02-10: 500 mg via INTRAVENOUS
  Filled 2024-02-10: qty 5

## 2024-02-10 MED ORDER — SODIUM CHLORIDE 0.9 % IV BOLUS
1000.0000 mL | Freq: Once | INTRAVENOUS | Status: AC
  Administered 2024-02-10: 1000 mL via INTRAVENOUS

## 2024-02-10 NOTE — ED Triage Notes (Signed)
 Pt here with reports of fever, cough, vomiting since yesterday.

## 2024-02-10 NOTE — ED Notes (Signed)
 Dr. Ginger notified of pt vitals.

## 2024-02-10 NOTE — ED Provider Notes (Signed)
 " [place] EMERGENCY DEPARTMENT AT [facility] Provider Note   CSN: [id number] Arrival date & time: 02/10/24  1211     Patient presents with: Fever   Debbie Dorsey is a 54 y.o. female.  {Add pertinent medical, surgical, social history, OB history to YEP:67052} Patient to ED with cough that started one week ago, productive, and fever that started last night. She reports nausea and vomiting, but denies diarrhea. No chest pain. She denies SOB.   The history is provided by the patient. No language interpreter was used.  Fever      Prior to Admission medications  Medication Sig Start Date End Date Taking? Authorizing Provider  acetaminophen  (TYLENOL ) 325 MG tablet Take 650 mg by mouth every 6 (six) hours as needed for mild pain or headache.    [provider]  albuterol  (VENTOLIN  HFA) 108 (90 Base) MCG/ACT inhaler Inhale 2 puffs into the lungs every 4 (four) hours as needed for shortness of breath or wheezing. 11/01/18   [provider]  Ascorbic Acid (VITAMIN C) 1000 MG tablet Take 1,000 mg by mouth in the morning.    [provider]  gabapentin  (NEURONTIN ) 300 MG capsule Take 1 capsule (300 mg total) by mouth 3 (three) times daily. 08/28/18   Malinda Rogue, MD  HUMALOG KWIKPEN 100 UNIT/ML KwikPen Inject 20 Units into the skin with breakfast, with lunch, and with evening meal. If blood sugar is 190 or up-=18 units  If less than 190=0 units 10/14/20   [provider]  LANTUS  SOLOSTAR 100 UNIT/ML Solostar Pen Inject 25 Units into the skin daily. 08/11/20   [provider]  naphazoline-pheniramine (ALLERGY EYE) 0.025-0.3 % ophthalmic solution Place 1-2 drops into both eyes 4 (four) times daily as needed for eye irritation.    [provider]  pantoprazole  (PROTONIX ) 40 MG tablet Take 1 tablet (40 mg total) by mouth 2 (two) times daily before a meal. 09/27/21   Armbruster, Elspeth SQUIBB, MD  Peppermint Oil (IBGARD) 90 MG CPCR Use as  directed as needed Patient not taking: Reported on 11/09/2021 09/07/21   Leigh Elspeth SQUIBB, MD  perphenazine  (TRILAFON ) 4 MG tablet 1 a day 3 at hs Patient not taking: Reported on 11/09/2021 12/06/18   Malinda Rogue, MD  rosuvastatin (CRESTOR) 5 MG tablet Take 5 mg by mouth daily. 09/02/20   [provider]  senna-docusate (SENOKOT-S) 8.6-50 MG tablet Take 2 tablets by mouth at bedtime. For AFTER surgery, do not take if having diarrhea 11/17/20   Micheline Setter D, NP  simethicone  (GAS-X) 80 MG chewable tablet Chew 1 tablet (80 mg total) by mouth every 6 (six) hours as needed for flatulence. 09/07/21   Armbruster, Elspeth SQUIBB, MD    Allergies: Patient has no known allergies.    Review of Systems  Constitutional:  Positive for fever.    Updated Vital Signs BP 113/69   Pulse (!) 128   Temp (!) 102.2 F (39 C) (Oral)   Resp (!) 22   SpO2 96%   Physical Exam Vitals and nursing note reviewed.  Constitutional:      General: She is not in acute distress.    Appearance: Normal appearance. She is not ill-appearing.  HENT:     Head: Normocephalic.     Mouth/Throat:     Mouth: Mucous membranes are dry.  Cardiovascular:     Rate and Rhythm: Regular rhythm. Tachycardia present.     Heart sounds: No murmur heard. Pulmonary:  Effort: Pulmonary effort is normal.     Breath sounds: No wheezing, rhonchi or rales.     Comments: Diminished air movement R>L bases.  Abdominal:     Palpations: Abdomen is soft.     Tenderness: There is no abdominal tenderness.  Musculoskeletal:     Cervical back: Normal range of motion and neck supple.  Skin:    General: Skin is warm and dry.  Neurological:     Mental Status: She is alert and oriented to person, place, and time.  Psychiatric:        Attention and Perception: Attention normal.        Mood and Affect: Affect is flat.        Speech: Speech normal.        Behavior: Behavior is cooperative.        Thought Content: Thought content is not  paranoid or delusional.     (all labs ordered are listed, but only abnormal results are displayed) Labs Reviewed  COMPREHENSIVE METABOLIC PANEL WITH GFR - Abnormal; Notable for the following components:      Result Value   CO2 19 (*)    Glucose, Bld 297 (*)    Creatinine, Ser 1.09 (*)    AST 112 (*)    ALT 66 (*)    All other components within normal limits  I-STAT CG4 LACTIC ACID, ED - Abnormal; Notable for the following components:   Lactic Acid, Venous 3.7 (*)    All other components within normal limits  CBG MONITORING, ED - Abnormal; Notable for the following components:   Glucose-Capillary 246 (*)    All other components within normal limits  CULTURE, BLOOD (ROUTINE X 2)  CULTURE, BLOOD (ROUTINE X 2)  RESP PANEL BY RT-PCR (RSV, FLU A&B, COVID)  RVPGX2  CBC WITH DIFFERENTIAL/PLATELET  PROTIME-INR  URINALYSIS, W/ REFLEX TO CULTURE (INFECTION SUSPECTED)  I-STAT CG4 LACTIC ACID, ED    EKG: None  Radiology: DG Chest 2 View if patient is not in a treatment room. Result Date: 02/10/2024 EXAM: 2 VIEW(S) XRAY OF THE CHEST 02/10/2024 01:18:00 PM COMPARISON: 10/27/2018 CLINICAL HISTORY: Suspected Sepsis FINDINGS: LUNGS AND PLEURA: No focal pulmonary opacity. No pleural effusion. No pneumothorax. HEART AND MEDIASTINUM: No acute abnormality of the cardiac and mediastinal silhouettes. BONES AND SOFT TISSUES: No acute osseous abnormality. IMPRESSION: 1. No acute cardiopulmonary abnormality. Electronically signed by: Waddell Calk MD 02/10/2024 01:23 PM EST RP Workstation: [id number]    {Document cardiac monitor, telemetry assessment procedure when appropriate:32947} Procedures   Medications Ordered in the ED  cefTRIAXone  (ROCEPHIN ) 1 g in sodium chloride  0.9 % 100 mL IVPB (has no administration in time range)  azithromycin  (ZITHROMAX ) 500 mg in sodium chloride  0.9 % 250 mL IVPB (has no administration in time range)  acetaminophen  (TYLENOL ) tablet 1,000 mg (1,000 mg Oral Given 02/10/24  1250)  sodium chloride  0.9 % bolus 1,000 mL (1,000 mLs Intravenous New Bag/Given 02/10/24 1325)  ibuprofen  (ADVIL ) tablet 600 mg (600 mg Oral Given 02/10/24 1356)    Clinical Course as of 02/10/24 1408  Sun Feb 10, 2024  1358 Patient to ED febrile, tachycardic, tachypneic, concern for sepsis given VS on arrival. She is awake, in NAD, nontoxic in appearance. IV fluids started. Initial lactic acid 3.7.  [SU]    Clinical Course User Index [SU] Odell Balls, PA-C   {Click here for ABCD2, HEART and other calculators REFRESH Note before signing:1}  Medical Decision Making Amount and/or Complexity of Data Reviewed Labs: ordered. Radiology: ordered.  Risk OTC drugs.   ***  {Document critical care time when appropriate  Document review of labs and clinical decision tools ie CHADS2VASC2, etc  Document your independent review of radiology images and any outside records  Document your discussion with family members, caretakers and with consultants  Document social determinants of health affecting pt's care  Document your decision making why or why not admission, treatments were needed:32947:::1}   Final diagnoses:  None    ED Discharge Orders     None        "

## 2024-02-10 NOTE — Discharge Instructions (Signed)
 As we talked about, your labs have improved over time and your fever is now gone. You can be discharged home. Continue Tylenol  and/or ibuprofen  for fever and aches. It is important to drink lots of fluids like juices and water . Get plenty of rest. If you are no better in 3-4 days, you can call your doctor for a phone or televisit for further advice. But please also, if you become worse or have concerning symptoms like chest pain or significant shortness of breath, return to the ED for further evaluation.

## 2024-02-12 ENCOUNTER — Ambulatory Visit (HOSPITAL_COMMUNITY)
Admission: EM | Admit: 2024-02-12 | Discharge: 2024-02-12 | Disposition: A | Discharge status: Home / Self Care | Attending: Physician Assistant | Admitting: Physician Assistant

## 2024-02-12 ENCOUNTER — Encounter (HOSPITAL_COMMUNITY): Payer: Self-pay

## 2024-02-12 DIAGNOSIS — R110 Nausea: Secondary | ICD-10-CM | POA: Diagnosis not present

## 2024-02-12 DIAGNOSIS — Z794 Long term (current) use of insulin: Secondary | ICD-10-CM | POA: Diagnosis not present

## 2024-02-12 DIAGNOSIS — E1169 Type 2 diabetes mellitus with other specified complication: Secondary | ICD-10-CM

## 2024-02-12 LAB — GLUCOSE, POCT (MANUAL RESULT ENTRY): POC Glucose: 208 mg/dL — AB (ref 70–99)

## 2024-02-12 MED ORDER — ONDANSETRON 4 MG PO TBDP: 4.0000 mg | ORAL_TABLET | Freq: Three times a day (TID) | ORAL | 0 refills | Status: AC | PRN

## 2024-02-12 MED ORDER — ONDANSETRON 4 MG PO TBDP
ORAL_TABLET | ORAL | Status: AC
  Filled 2024-02-12: qty 1

## 2024-02-12 MED ORDER — ONDANSETRON 4 MG PO TBDP
4.0000 mg | ORAL_TABLET | Freq: Once | ORAL | Status: AC
  Administered 2024-02-12: 4 mg via ORAL

## 2024-02-12 NOTE — ED Triage Notes (Addendum)
 Patient reports that she has had vomiting, a non productive cough, weakness, and a fever x 1 week.    Patient states she has been taking Tylenol  for her symptoms.

## 2024-02-12 NOTE — Discharge Instructions (Addendum)
 Return if any problems.  Take the nausea medication and eat and drink.  You can take tylenol  if you have any discomfort

## 2024-02-12 NOTE — ED Provider Notes (Signed)
 " [facility]    CSN: [id number] Arrival date & time: 02/12/24  1103      History   Chief Complaint Chief Complaint  Patient presents with   Weakness   Emesis   Cough   Fever    HPI Debbie Dorsey is a 54 y.o. female.   Patient complains of vomiting on and off for the past 2 weeks.  Patient reports that she has had a cough.  Patient was seen by the emergency department 2 days ago and had laboratory testing.  Patient states that they did not find anything.  Patient reports that when she eats it makes her nauseated.  Patient states she vomits after eating.  Patient has drank some water  today.  Patient denies any fever or chills.  Patient was supposed to follow-up with her primary care doctor yesterday but she did not go to the appointment.  The history is provided by the patient. No language interpreter was used.  Weakness Severity:  Moderate Onset quality:  Gradual Progression:  Worsening Relieved by:  Nothing Worsened by:  Nothing Ineffective treatments:  None tried Associated symptoms: cough, fever and vomiting   Emesis Severity:  Moderate Timing:  Constant Associated symptoms: cough and fever   Cough Associated symptoms: fever   Fever Associated symptoms: cough and vomiting     Past Medical History:  Diagnosis Date   Asthma    Diabetes mellitus without complication (HCC)    GERD (gastroesophageal reflux disease)    History of pulmonary embolism    HLD (hyperlipidemia)    Schizophrenia (HCC)     Patient Active Problem List   Diagnosis Date Noted   Bloated abdomen 07/12/2021   Constipation 07/12/2021   Abnormal uterine bleeding (AUB) 05/31/2021   Pelvic mass    Schizophrenia (HCC) 07/30/2018   DKA (diabetic ketoacidosis) (HCC) 07/29/2018   ARF (acute renal failure) 07/29/2018   Altered mental status, unspecified    Acute encephalopathy 05/25/2018   Acute pulmonary embolism (HCC) 05/25/2018   Type 2 diabetes mellitus (HCC) 05/25/2018    Psychosis (HCC) 04/08/2018   Paranoid schizophrenia (HCC) 04/02/2018    Past Surgical History:  Procedure Laterality Date   BREAST SURGERY Right    COLONOSCOPY  09/27/2021   Dr.Armbruster   ROBOTIC ASSISTED BILATERAL SALPINGO OOPHERECTOMY N/A 11/25/2020   Procedure: XI ROBOTIC ASSISTED RIGHT SALPINGECTOMY, LEFT SALPINGOOPHORECTOMY;  Surgeon: Viktoria Comer SAUNDERS, MD;  Location: [facility];  Service: Gynecology;  Laterality: N/A;   UPPER GASTROINTESTINAL ENDOSCOPY  09/27/2021   Dr.Armbruster    OB History     Gravida  1   Para  1   Term  1   Preterm  0   AB  0   Living  1      SAB  0   IAB  0   Ectopic  0   Multiple  0   Live Births  1            Home Medications    Prior to Admission medications  Medication Sig Start Date End Date Taking? Authorizing Provider  ondansetron  (ZOFRAN -ODT) 4 MG disintegrating tablet Take 1 tablet (4 mg total) by mouth every 8 (eight) hours as needed for nausea or vomiting. 02/12/24  Yes Abanoub Hanken K, PA-C  acetaminophen  (TYLENOL ) 325 MG tablet Take 650 mg by mouth every 6 (six) hours as needed for mild pain or headache.    [provider]  albuterol  (VENTOLIN  HFA) 108 (90 Base) MCG/ACT inhaler Inhale 2  puffs into the lungs every 4 (four) hours as needed for shortness of breath or wheezing. 11/01/18   [provider]  Ascorbic Acid (VITAMIN C) 1000 MG tablet Take 1,000 mg by mouth in the morning.    [provider]  gabapentin  (NEURONTIN ) 300 MG capsule Take 1 capsule (300 mg total) by mouth 3 (three) times daily. 08/28/18   Malinda Rogue, MD  HUMALOG KWIKPEN 100 UNIT/ML KwikPen Inject 20 Units into the skin with breakfast, with lunch, and with evening meal. If blood sugar is 190 or up-=18 units  If less than 190=0 units 10/14/20   [provider]  LANTUS  SOLOSTAR 100 UNIT/ML Solostar Pen Inject 25 Units into the skin daily. 08/11/20   [provider]  naphazoline-pheniramine (ALLERGY EYE)  0.025-0.3 % ophthalmic solution Place 1-2 drops into both eyes 4 (four) times daily as needed for eye irritation.    [provider]  pantoprazole  (PROTONIX ) 40 MG tablet Take 1 tablet (40 mg total) by mouth 2 (two) times daily before a meal. 09/27/21   Armbruster, Elspeth SQUIBB, MD  Peppermint Oil (IBGARD) 90 MG CPCR Use as directed as needed Patient not taking: Reported on 11/09/2021 09/07/21   Leigh Elspeth SQUIBB, MD  perphenazine  (TRILAFON ) 4 MG tablet 1 a day 3 at hs Patient not taking: Reported on 11/09/2021 12/06/18   Malinda Rogue, MD  rosuvastatin (CRESTOR) 5 MG tablet Take 5 mg by mouth daily. 09/02/20   [provider]  senna-docusate (SENOKOT-S) 8.6-50 MG tablet Take 2 tablets by mouth at bedtime. For AFTER surgery, do not take if having diarrhea 11/17/20   Micheline Setter D, NP  simethicone  (GAS-X) 80 MG chewable tablet Chew 1 tablet (80 mg total) by mouth every 6 (six) hours as needed for flatulence. 09/07/21   Armbruster, Elspeth SQUIBB, MD    Family History Family History  Problem Relation Age of Onset   Breast cancer Mother    Prostate cancer Father    Mental illness Sister    Colon cancer Neg Hx    Esophageal cancer Neg Hx    Rectal cancer Neg Hx    Stomach cancer Neg Hx     Social History Social History[1]   Allergies   Patient has no known allergies.   Review of Systems Review of Systems  Constitutional:  Positive for fever.  Respiratory:  Positive for cough.   Gastrointestinal:  Positive for vomiting.  Neurological:  Positive for weakness.  All other systems reviewed and are negative.    Physical Exam Triage Vital Signs ED Triage Vitals  Encounter Vitals Group     BP 02/12/24 1202 124/79     Girls Systolic BP Percentile --      Girls Diastolic BP Percentile --      Boys Systolic BP Percentile --      Boys Diastolic BP Percentile --      Pulse Rate 02/12/24 1202 99     Resp 02/12/24 1202 16     Temp 02/12/24 1202 98.5 F (36.9 C)     Temp Source  02/12/24 1202 Oral     SpO2 02/12/24 1202 99 %     Weight --      Height --      Head Circumference --      Peak Flow --      Pain Score 02/12/24 1205 0     Pain Loc --      Pain Education --      Exclude from  Growth Chart --    No data found.  Updated Vital Signs BP 124/79 (BP Location: Right Arm)   Pulse 99   Temp 98.5 F (36.9 C) (Oral)   Resp 16   LMP  (LMP Unknown)   SpO2 99%   Visual Acuity Right Eye Distance:   Left Eye Distance:   Bilateral Distance:    Right Eye Near:   Left Eye Near:    Bilateral Near:     Physical Exam Vitals and nursing note reviewed.  Constitutional:      Appearance: She is well-developed.  HENT:     Head: Normocephalic.     Nose: Nose normal.     Mouth/Throat:     Mouth: Mucous membranes are moist.  Cardiovascular:     Rate and Rhythm: Normal rate.  Pulmonary:     Effort: Pulmonary effort is normal.  Abdominal:     General: There is no distension.  Musculoskeletal:        General: Normal range of motion.     Cervical back: Normal range of motion.  Skin:    General: Skin is warm.  Neurological:     General: No focal deficit present.     Mental Status: She is alert and oriented to person, place, and time.  Psychiatric:        Mood and Affect: Mood normal.      UC Treatments / Results  Labs (all labs ordered are listed, but only abnormal results are displayed) Labs Reviewed  GLUCOSE, POCT (MANUAL RESULT ENTRY) - Abnormal; Notable for the following components:      Result Value   POC Glucose 208 (*)    All other components within normal limits    EKG   Radiology No results found.  Procedures Procedures (including critical care time)  Medications Ordered in UC Medications  ondansetron  (ZOFRAN -ODT) disintegrating tablet 4 mg (4 mg Oral Given 02/12/24 1338)    Initial Impression / Assessment and Plan / UC Course  I have reviewed the triage vital signs and the nursing notes.  Pertinent labs & imaging results  that were available during my care of the patient were reviewed by me and considered in my medical decision making (see chart for details).    Pt not eating or drinking due to nausea.  Pt given zofran  odt.  Pt encouraged to drink and eat.  Pt advised to follow up with her provider for recheck.  Final Clinical Impressions(s) / UC Diagnoses   Final diagnoses:  Type 2 diabetes mellitus with other specified complication, with long-term current use of insulin  (HCC)  Nausea without vomiting     Discharge Instructions      Return if any problems.  Take the nausea medication and eat and drink.  You can take tylenol  if you have any discomfort   ED Prescriptions     Medication Sig Dispense Auth. Provider   ondansetron  (ZOFRAN -ODT) 4 MG disintegrating tablet Take 1 tablet (4 mg total) by mouth every 8 (eight) hours as needed for nausea or vomiting. 20 tablet Tashai Catino K, PA-C      PDMP not reviewed this encounter. An After Visit Summary was printed and given to the patient.         [1]  Social History Tobacco Use   Smoking status: Never   Smokeless tobacco: Never  Vaping Use   Vaping status: Never Used  Substance Use Topics   Alcohol use: No   Drug use: No  Flint Sonny POUR, PA-C 02/12/24 1759  "

## 2024-02-14 ENCOUNTER — Telehealth (HOSPITAL_COMMUNITY): Payer: Self-pay

## 2024-02-14 NOTE — ED Notes (Addendum)
 02/14/24 1220 Micro called with + Blood culture, Follow up with pt was done by Dena, RN

## 2024-02-14 NOTE — ED Provider Notes (Signed)
 Pt had 4 blood cultures done several days ago showing gram+ rods 1 out of 4.  This is likely a contaminant.  Request for our staff to call for a symptom check.  Pt may return if sxs worsen.     Nivia Colon, PA-C 02/14/24 1227    Mannie Pac T, DO 02/16/24 [phone]

## 2024-02-15 LAB — CULTURE, BLOOD (ROUTINE X 2)
Culture: NO GROWTH
Special Requests: ADEQUATE

## 2024-02-17 ENCOUNTER — Encounter (HOSPITAL_COMMUNITY): Payer: Self-pay

## 2024-02-17 ENCOUNTER — Emergency Department (HOSPITAL_COMMUNITY)

## 2024-02-17 ENCOUNTER — Other Ambulatory Visit: Payer: Self-pay

## 2024-02-17 ENCOUNTER — Inpatient Hospital Stay (HOSPITAL_COMMUNITY)
Admission: EM | Admit: 2024-02-17 | Discharge: 2024-02-21 | DRG: 871 | Disposition: A | Discharge status: Home / Self Care | Attending: Internal Medicine | Admitting: Internal Medicine

## 2024-02-17 DIAGNOSIS — R6520 Severe sepsis without septic shock: Secondary | ICD-10-CM | POA: Diagnosis not present

## 2024-02-17 DIAGNOSIS — F200 Paranoid schizophrenia: Secondary | ICD-10-CM | POA: Diagnosis present

## 2024-02-17 DIAGNOSIS — E785 Hyperlipidemia, unspecified: Secondary | ICD-10-CM | POA: Diagnosis present

## 2024-02-17 DIAGNOSIS — Z794 Long term (current) use of insulin: Secondary | ICD-10-CM

## 2024-02-17 DIAGNOSIS — E119 Type 2 diabetes mellitus without complications: Secondary | ICD-10-CM

## 2024-02-17 DIAGNOSIS — R6510 Systemic inflammatory response syndrome (SIRS) of non-infectious origin without acute organ dysfunction: Secondary | ICD-10-CM | POA: Diagnosis present

## 2024-02-17 DIAGNOSIS — E1110 Type 2 diabetes mellitus with ketoacidosis without coma: Secondary | ICD-10-CM | POA: Diagnosis present

## 2024-02-17 DIAGNOSIS — G9341 Metabolic encephalopathy: Secondary | ICD-10-CM | POA: Diagnosis present

## 2024-02-17 DIAGNOSIS — E1142 Type 2 diabetes mellitus with diabetic polyneuropathy: Secondary | ICD-10-CM | POA: Diagnosis present

## 2024-02-17 DIAGNOSIS — Z803 Family history of malignant neoplasm of breast: Secondary | ICD-10-CM

## 2024-02-17 DIAGNOSIS — R339 Retention of urine, unspecified: Secondary | ICD-10-CM | POA: Diagnosis present

## 2024-02-17 DIAGNOSIS — Z8042 Family history of malignant neoplasm of prostate: Secondary | ICD-10-CM

## 2024-02-17 DIAGNOSIS — Z818 Family history of other mental and behavioral disorders: Secondary | ICD-10-CM

## 2024-02-17 DIAGNOSIS — E1100 Type 2 diabetes mellitus with hyperosmolarity without nonketotic hyperglycemic-hyperosmolar coma (NKHHC): Secondary | ICD-10-CM | POA: Diagnosis present

## 2024-02-17 DIAGNOSIS — Z79899 Other long term (current) drug therapy: Secondary | ICD-10-CM

## 2024-02-17 DIAGNOSIS — Z1152 Encounter for screening for COVID-19: Secondary | ICD-10-CM

## 2024-02-17 DIAGNOSIS — E1165 Type 2 diabetes mellitus with hyperglycemia: Secondary | ICD-10-CM | POA: Diagnosis not present

## 2024-02-17 DIAGNOSIS — J45909 Unspecified asthma, uncomplicated: Secondary | ICD-10-CM | POA: Diagnosis present

## 2024-02-17 DIAGNOSIS — E860 Dehydration: Secondary | ICD-10-CM | POA: Diagnosis present

## 2024-02-17 DIAGNOSIS — A419 Sepsis, unspecified organism: Secondary | ICD-10-CM | POA: Diagnosis not present

## 2024-02-17 DIAGNOSIS — E871 Hypo-osmolality and hyponatremia: Secondary | ICD-10-CM | POA: Diagnosis present

## 2024-02-17 DIAGNOSIS — G9340 Encephalopathy, unspecified: Secondary | ICD-10-CM | POA: Diagnosis present

## 2024-02-17 DIAGNOSIS — Z86711 Personal history of pulmonary embolism: Secondary | ICD-10-CM

## 2024-02-17 DIAGNOSIS — B9729 Other coronavirus as the cause of diseases classified elsewhere: Secondary | ICD-10-CM | POA: Diagnosis present

## 2024-02-17 DIAGNOSIS — K219 Gastro-esophageal reflux disease without esophagitis: Secondary | ICD-10-CM | POA: Diagnosis present

## 2024-02-17 DIAGNOSIS — J069 Acute upper respiratory infection, unspecified: Secondary | ICD-10-CM | POA: Diagnosis present

## 2024-02-17 LAB — URINALYSIS, W/ REFLEX TO CULTURE (INFECTION SUSPECTED)
Bacteria, UA: NONE SEEN
Bilirubin Urine: NEGATIVE
Glucose, UA: >=500 mg/dL — AB
Hgb urine dipstick: NEGATIVE
Ketones, ur: 20 mg/dL — AB
Leukocytes,Ua: NEGATIVE
Nitrite: NEGATIVE
Protein, ur: NEGATIVE mg/dL
Specific Gravity, Urine: 1.013 (ref 1.005–1.030)
pH: 6 (ref 5.0–8.0)

## 2024-02-17 LAB — LACTIC ACID, PLASMA
Lactic Acid, Venous: 2.7 mmol/L (ref 0.5–1.9)
Lactic Acid, Venous: 2.4 mmol/L (ref 0.5–1.9)

## 2024-02-17 LAB — HEMOGLOBIN A1C
Hgb A1c MFr Bld: 9.9 % — ABNORMAL HIGH (ref 4.8–5.6)
Mean Plasma Glucose: 237.43 mg/dL

## 2024-02-17 LAB — TROPONIN T, HIGH SENSITIVITY
Troponin T High Sensitivity: 19 ng/L (ref 0–19)
Troponin T High Sensitivity: 17 ng/L (ref 0–19)

## 2024-02-17 LAB — I-STAT VENOUS BLOOD GAS, ED
Acid-base deficit: 4 mmol/L — ABNORMAL HIGH (ref 0.0–2.0)
Bicarbonate: 19.6 mmol/L — ABNORMAL LOW (ref 20.0–28.0)
Calcium, Ion: 1.13 mmol/L — ABNORMAL LOW (ref 1.15–1.40)
HCT: 43 % (ref 36.0–46.0)
Hemoglobin: 14.6 g/dL (ref 12.0–15.0)
O2 Saturation: 59 %
Potassium: 4.4 mmol/L (ref 3.5–5.1)
Sodium: 133 mmol/L — ABNORMAL LOW (ref 135–145)
TCO2: 21 mmol/L — ABNORMAL LOW (ref 22–32)
pCO2, Ven: 32.3 mmHg — ABNORMAL LOW (ref 44–60)
pH, Ven: 7.391 (ref 7.25–7.43)
pO2, Ven: 30 mmHg — CL (ref 32–45)

## 2024-02-17 LAB — CBC WITH DIFFERENTIAL/PLATELET
Abs Immature Granulocytes: 0.01 K/uL (ref 0.00–0.07)
Basophils Absolute: 0 K/uL (ref 0.0–0.1)
Basophils Relative: 1 %
Eosinophils Absolute: 0.1 K/uL (ref 0.0–0.5)
Eosinophils Relative: 2 %
HCT: 41.6 % (ref 36.0–46.0)
Hemoglobin: 14.1 g/dL (ref 12.0–15.0)
Immature Granulocytes: 0 %
Lymphocytes Relative: 48 %
Lymphs Abs: 1.8 K/uL (ref 0.7–4.0)
MCH: 32.8 pg (ref 26.0–34.0)
MCHC: 33.9 g/dL (ref 30.0–36.0)
MCV: 96.7 fL (ref 80.0–100.0)
Monocytes Absolute: 0.3 K/uL (ref 0.1–1.0)
Monocytes Relative: 8 %
Neutro Abs: 1.5 K/uL — ABNORMAL LOW (ref 1.7–7.7)
Neutrophils Relative %: 41 %
Platelets: 568 K/uL — ABNORMAL HIGH (ref 150–400)
RBC: 4.3 MIL/uL (ref 3.87–5.11)
RDW: 13.4 % (ref 11.5–15.5)
WBC: 3.7 K/uL — ABNORMAL LOW (ref 4.0–10.5)
nRBC: 0 % (ref 0.0–0.2)

## 2024-02-17 LAB — COMPREHENSIVE METABOLIC PANEL WITH GFR
ALT: 34 U/L (ref 0–44)
AST: 28 U/L (ref 15–41)
Albumin: 4.5 g/dL (ref 3.5–5.0)
Alkaline Phosphatase: 67 U/L (ref 38–126)
Anion gap: 15 (ref 5–15)
BUN: 11 mg/dL (ref 6–20)
CO2: 19 mmol/L — ABNORMAL LOW (ref 22–32)
Calcium: 9.5 mg/dL (ref 8.9–10.3)
Chloride: 97 mmol/L — ABNORMAL LOW (ref 98–111)
Creatinine, Ser: 0.92 mg/dL (ref 0.44–1.00)
GFR, Estimated: >60 mL/min
Glucose, Bld: 303 mg/dL — ABNORMAL HIGH (ref 70–99)
Potassium: 4.6 mmol/L (ref 3.5–5.1)
Sodium: 131 mmol/L — ABNORMAL LOW (ref 135–145)
Total Bilirubin: 0.8 mg/dL (ref 0.0–1.2)
Total Protein: 7.6 g/dL (ref 6.5–8.1)

## 2024-02-17 LAB — RESP PANEL BY RT-PCR (RSV, FLU A&B, COVID)  RVPGX2
Influenza A by PCR: NEGATIVE
Influenza B by PCR: NEGATIVE
Resp Syncytial Virus by PCR: NEGATIVE
SARS Coronavirus 2 by RT PCR: NEGATIVE

## 2024-02-17 LAB — APTT: aPTT: 26 s (ref 24–36)

## 2024-02-17 LAB — PROTIME-INR
INR: 1 (ref 0.8–1.2)
INR: 1.1 (ref 0.8–1.2)
Prothrombin Time: 13.7 s (ref 11.4–15.2)
Prothrombin Time: 14.3 s (ref 11.4–15.2)

## 2024-02-17 LAB — HIV ANTIBODY (ROUTINE TESTING W REFLEX): HIV Screen 4th Generation wRfx: NONREACTIVE

## 2024-02-17 LAB — BETA-HYDROXYBUTYRIC ACID: Beta-Hydroxybutyric Acid: 1.38 mmol/L — ABNORMAL HIGH (ref 0.05–0.27)

## 2024-02-17 LAB — I-STAT CG4 LACTIC ACID, ED
Lactic Acid, Venous: 2 mmol/L (ref 0.5–1.9)
Lactic Acid, Venous: 2.2 mmol/L (ref 0.5–1.9)
Lactic Acid, Venous: 2.8 mmol/L (ref 0.5–1.9)

## 2024-02-17 LAB — CBG MONITORING, ED: Glucose-Capillary: 298 mg/dL — ABNORMAL HIGH (ref 70–99)

## 2024-02-17 LAB — C-REACTIVE PROTEIN: CRP: <0.5 mg/dL

## 2024-02-17 LAB — PHOSPHORUS: Phosphorus: 1.7 mg/dL — ABNORMAL LOW (ref 2.5–4.6)

## 2024-02-17 LAB — GLUCOSE, CAPILLARY
Glucose-Capillary: 254 mg/dL — ABNORMAL HIGH (ref 70–99)
Glucose-Capillary: 234 mg/dL — ABNORMAL HIGH (ref 70–99)

## 2024-02-17 LAB — HCG, SERUM, QUALITATIVE: Preg, Serum: NEGATIVE

## 2024-02-17 LAB — SEDIMENTATION RATE: Sed Rate: 14 mm/h (ref 0–22)

## 2024-02-17 LAB — MAGNESIUM: Magnesium: 2.4 mg/dL (ref 1.7–2.4)

## 2024-02-17 MED ORDER — TRAZODONE HCL 50 MG PO TABS
25.0000 mg | ORAL_TABLET | Freq: Every evening | ORAL | Status: DC | PRN
  Administered 2024-02-17 – 2024-02-19 (×3): 25 mg via ORAL
  Filled 2024-02-17 (×3): qty 1

## 2024-02-17 MED ORDER — PANTOPRAZOLE SODIUM 40 MG PO TBEC
40.0000 mg | DELAYED_RELEASE_TABLET | Freq: Every day | ORAL | Status: DC
  Administered 2024-02-18 – 2024-02-21 (×4): 40 mg via ORAL
  Filled 2024-02-17 (×4): qty 1

## 2024-02-17 MED ORDER — SODIUM CHLORIDE 0.9% FLUSH: 3.0000 mL | Freq: Two times a day (BID) | INTRAVENOUS | Status: DC

## 2024-02-17 MED ORDER — METOPROLOL TARTRATE 5 MG/5ML IV SOLN
5.0000 mg | Freq: Once | INTRAVENOUS | Status: AC
  Administered 2024-02-17: 5 mg via INTRAVENOUS
  Filled 2024-02-17: qty 5

## 2024-02-17 MED ORDER — ROSUVASTATIN CALCIUM 5 MG PO TABS: 5.0000 mg | ORAL_TABLET | Freq: Every day | ORAL | Status: DC

## 2024-02-17 MED ORDER — IPRATROPIUM BROMIDE 0.02 % IN SOLN: 0.5000 mg | Freq: Four times a day (QID) | RESPIRATORY_TRACT | Status: DC | PRN

## 2024-02-17 MED ORDER — HEPARIN SODIUM (PORCINE) 5000 UNIT/ML IJ SOLN
5000.0000 [IU] | Freq: Three times a day (TID) | INTRAMUSCULAR | Status: DC
  Administered 2024-02-17 – 2024-02-21 (×12): 5000 [IU] via SUBCUTANEOUS
  Filled 2024-02-17 (×12): qty 1

## 2024-02-17 MED ORDER — LACTATED RINGERS IV BOLUS (SEPSIS)
1000.0000 mL | Freq: Once | INTRAVENOUS | Status: AC
  Administered 2024-02-17: 1000 mL via INTRAVENOUS

## 2024-02-17 MED ORDER — METRONIDAZOLE 500 MG/100ML IV SOLN: 500.0000 mg | Freq: Once | INTRAVENOUS | Status: DC

## 2024-02-17 MED ORDER — SODIUM CHLORIDE 0.9 % IV SOLN
2.0000 g | Freq: Three times a day (TID) | INTRAVENOUS | Status: DC
  Administered 2024-02-17 – 2024-02-18 (×3): 2 g via INTRAVENOUS
  Filled 2024-02-17 (×3): qty 12.5

## 2024-02-17 MED ORDER — INSULIN ASPART 100 UNIT/ML IJ SOLN: 0.0000 [IU] | Freq: Three times a day (TID) | INTRAMUSCULAR | Status: DC

## 2024-02-17 MED ORDER — ACETAMINOPHEN 325 MG PO TABS
650.0000 mg | ORAL_TABLET | Freq: Four times a day (QID) | ORAL | Status: DC | PRN
  Administered 2024-02-17 – 2024-02-19 (×4): 650 mg via ORAL
  Filled 2024-02-17 (×5): qty 2

## 2024-02-17 MED ORDER — LACTATED RINGERS IV BOLUS (SEPSIS): 1000.0000 mL | Freq: Once | INTRAVENOUS | Status: DC

## 2024-02-17 MED ORDER — FLORANEX PO PACK
1.0000 g | PACK | Freq: Three times a day (TID) | ORAL | Status: DC
  Administered 2024-02-17 – 2024-02-21 (×9): 1 g via ORAL
  Filled 2024-02-17 (×12): qty 1

## 2024-02-17 MED ORDER — HYDROMORPHONE HCL 1 MG/ML IJ SOLN: 0.5000 mg | INTRAMUSCULAR | Status: DC | PRN

## 2024-02-17 MED ORDER — ONDANSETRON HCL 4 MG/2ML IJ SOLN: 4.0000 mg | Freq: Four times a day (QID) | INTRAMUSCULAR | Status: DC | PRN

## 2024-02-17 MED ORDER — LACTATED RINGERS IV BOLUS
1000.0000 mL | Freq: Once | INTRAVENOUS | Status: AC
  Administered 2024-02-17: 1000 mL via INTRAVENOUS

## 2024-02-17 MED ORDER — INSULIN ASPART 100 UNIT/ML IJ SOLN
0.0000 [IU] | Freq: Three times a day (TID) | INTRAMUSCULAR | Status: DC
  Administered 2024-02-17: 11 [IU] via SUBCUTANEOUS
  Administered 2024-02-18: 3 [IU] via SUBCUTANEOUS
  Administered 2024-02-18: 4 [IU] via SUBCUTANEOUS
  Administered 2024-02-18: 2 [IU] via SUBCUTANEOUS
  Administered 2024-02-19 (×2): 4 [IU] via SUBCUTANEOUS
  Administered 2024-02-19: 3 [IU] via SUBCUTANEOUS
  Filled 2024-02-17 (×7): qty 1

## 2024-02-17 MED ORDER — INSULIN LISPRO (1 UNIT DIAL) 100 UNIT/ML (KWIKPEN): 20.0000 [IU] | PEN_INJECTOR | Freq: Three times a day (TID) | SUBCUTANEOUS | Status: DC

## 2024-02-17 MED ORDER — SODIUM CHLORIDE 0.9% FLUSH
3.0000 mL | Freq: Two times a day (BID) | INTRAVENOUS | Status: DC
  Administered 2024-02-17 – 2024-02-21 (×9): 3 mL via INTRAVENOUS

## 2024-02-17 MED ORDER — VANCOMYCIN HCL 500 MG/100ML IV SOLN
500.0000 mg | Freq: Two times a day (BID) | INTRAVENOUS | Status: DC
  Administered 2024-02-17: 500 mg via INTRAVENOUS
  Filled 2024-02-17 (×2): qty 100

## 2024-02-17 MED ORDER — INSULIN GLARGINE 100 UNIT/ML ~~LOC~~ SOLN
30.0000 [IU] | Freq: Every day | SUBCUTANEOUS | Status: DC
  Administered 2024-02-17 – 2024-02-19 (×3): 30 [IU] via SUBCUTANEOUS
  Filled 2024-02-17 (×4): qty 0.3

## 2024-02-17 MED ORDER — SENNOSIDES-DOCUSATE SODIUM 8.6-50 MG PO TABS
1.0000 | ORAL_TABLET | Freq: Every day | ORAL | Status: DC
  Administered 2024-02-17 – 2024-02-20 (×4): 1 via ORAL
  Filled 2024-02-17 (×4): qty 1

## 2024-02-17 MED ORDER — SODIUM CHLORIDE 0.9 % IV SOLN: 2.0000 g | Freq: Once | INTRAVENOUS | Status: DC

## 2024-02-17 MED ORDER — GABAPENTIN 300 MG PO CAPS
300.0000 mg | ORAL_CAPSULE | Freq: Three times a day (TID) | ORAL | Status: DC
  Administered 2024-02-17 – 2024-02-21 (×12): 300 mg via ORAL
  Filled 2024-02-17 (×5): qty 1
  Filled 2024-02-17: qty 3
  Filled 2024-02-17 (×7): qty 1

## 2024-02-17 MED ORDER — SODIUM CHLORIDE 0.9 % IV SOLN: INTRAVENOUS | Status: DC

## 2024-02-17 MED ORDER — PANTOPRAZOLE SODIUM 40 MG PO TBEC: 40.0000 mg | DELAYED_RELEASE_TABLET | Freq: Two times a day (BID) | ORAL | Status: DC

## 2024-02-17 MED ORDER — FLEET ENEMA RE ENEM: 1.0000 | ENEMA | Freq: Once | RECTAL | Status: DC | PRN

## 2024-02-17 MED ORDER — ROSUVASTATIN CALCIUM 5 MG PO TABS
5.0000 mg | ORAL_TABLET | Freq: Every day | ORAL | Status: DC
  Administered 2024-02-18 – 2024-02-21 (×4): 5 mg via ORAL
  Filled 2024-02-17 (×4): qty 1

## 2024-02-17 MED ORDER — INSULIN ASPART 100 UNIT/ML IJ SOLN
0.0000 [IU] | Freq: Every day | INTRAMUSCULAR | Status: DC
  Administered 2024-02-17: 2 [IU] via SUBCUTANEOUS
  Filled 2024-02-17 (×2): qty 1

## 2024-02-17 MED ORDER — ACETAMINOPHEN 650 MG RE SUPP: 650.0000 mg | Freq: Four times a day (QID) | RECTAL | Status: DC | PRN

## 2024-02-17 MED ORDER — LACTATED RINGERS IV SOLN: INTRAVENOUS | Status: AC

## 2024-02-17 MED ORDER — LACTATED RINGERS IV SOLN: 150.0000 mL/h | INTRAVENOUS | Status: DC

## 2024-02-17 MED ORDER — VANCOMYCIN HCL IN DEXTROSE 1-5 GM/200ML-% IV SOLN: 1000.0000 mg | Freq: Once | INTRAVENOUS | Status: DC

## 2024-02-17 MED ORDER — VANCOMYCIN HCL 1250 MG/250ML IV SOLN
1250.0000 mg | Freq: Once | INTRAVENOUS | Status: AC
  Administered 2024-02-17: 1250 mg via INTRAVENOUS
  Filled 2024-02-17: qty 250

## 2024-02-17 MED ORDER — HYDRALAZINE HCL 20 MG/ML IJ SOLN: 10.0000 mg | INTRAMUSCULAR | Status: DC | PRN

## 2024-02-17 MED ORDER — ONDANSETRON HCL 4 MG PO TABS: 4.0000 mg | ORAL_TABLET | Freq: Four times a day (QID) | ORAL | Status: DC | PRN

## 2024-02-17 MED ORDER — METRONIDAZOLE 500 MG/100ML IV SOLN
500.0000 mg | Freq: Two times a day (BID) | INTRAVENOUS | Status: DC
  Administered 2024-02-17 (×2): 500 mg via INTRAVENOUS
  Filled 2024-02-17 (×2): qty 100

## 2024-02-17 MED ORDER — OXYCODONE HCL 5 MG PO TABS: 5.0000 mg | ORAL_TABLET | ORAL | Status: DC | PRN

## 2024-02-17 MED ORDER — BISACODYL 5 MG PO TBEC: 5.0000 mg | DELAYED_RELEASE_TABLET | Freq: Every day | ORAL | Status: DC | PRN

## 2024-02-17 MED ORDER — INSULIN GLARGINE-YFGN 100 UNIT/ML ~~LOC~~ SOLN: 25.0000 [IU] | Freq: Every day | SUBCUTANEOUS | Status: DC

## 2024-02-17 MED ORDER — SENNOSIDES-DOCUSATE SODIUM 8.6-50 MG PO TABS: 1.0000 | ORAL_TABLET | Freq: Every evening | ORAL | Status: DC | PRN

## 2024-02-17 NOTE — Assessment & Plan Note (Signed)
 Generalized metabolic encephalopathy Ruling out sepsis, underlying infection versus polypharmacy, with underlying history of schizophrenia -Mentation has improved -Will follow closely

## 2024-02-17 NOTE — Assessment & Plan Note (Signed)
-   Continue rosuvastatin

## 2024-02-17 NOTE — Assessment & Plan Note (Signed)
 Meeting SIRS, early sepsis criteria:  Tmax 100.7, HR 112, lactic acid 2.0, 2.2, WBC 3.7,  -Unknown etiology, unknown source of infection -UA chest x-ray head imaging negative -Per sepsis protocol, will continue with IV fluid resuscitation and broad-spectrum antibiotics (cefepime /Flagyl /vancomycin ) -Will follow-up with cultures-narrow down antibiotics in next 24-48 hours -Obtaining procalcitonin level, ESR, CRP, Will monitor closely

## 2024-02-17 NOTE — Progress Notes (Signed)
 Tried to call grandfather to do admission questions and neither number on file works

## 2024-02-17 NOTE — Assessment & Plan Note (Addendum)
 Does not quite meet quite DKA criteria -Although blood glucose levels 303, lactic acid 2.0, 2.2, mild hyponatremia with sodium 131, beta-hydroxybutyrate elevated at 1.38, Anion gap 15 - Will continue with aggressive fluid resuscitation -Will initiate sliding scale insulin , will check CBG every hour's then every 4 hours given then  ACHS - with SSI coverage - Resume long-acting insulin  25 >> 30 U nightly -Last A1c 8.5, repeating

## 2024-02-17 NOTE — Hospital Course (Signed)
 Debbie Dorsey is a 58 history of schizophrenia, DM2, HLD, GERD, and peripheral neuropathy, who presents today with a one-week history of generalized malaise, chills, and progressive weakness.  Earlier this week, the patient sought care at [facility] for similar symptoms. According to the patient and her grandfather, no definitive etiology was identified at that time; however, she was discharged on a course of oral antibiotics. Despite adherence to the medication, she reports no clinical improvement and notes that her weakness has become progressively worse.  This morning, the patient was found with significant shivering/rigors. She is currently a limited historian due to her baseline mental health status, but she denies dysuria, polyuria, or increased urinary frequency. She further denies chest pain, shortness of breath, headache, or visual changes. There are no reported focal neurological deficits, such as asymmetric weakness, and the patient remains at her baseline mental status without new confusion now.      ED Evaluation: Blood pressure 119/85, pulse (!) 108, temperature (!) 100.7 F (38.2 C), temperature source Oral, resp. rate 17, height 5' 4.8 (1.646 m), weight 60.8 kg, SpO2 100%.  LABs: VBG 7.39/32 point 05/06/2019 bicarb of 19.6 Sodium 133, glucose 303, calcium  9.5, ionized 1.13, lactic acid 2.0 >>> 2.2, WBC 3.7 Neutrophil 1.5, beta-hydroxybutyrate 1.38, respiratory panel negative, UA > glucose otherwise unremarkable -CT head -within normal limits, chest x-ray-no acute cardiopulmonary disease

## 2024-02-17 NOTE — H&P (Signed)
 " History and Physical   Patient: Debbie Dorsey                            PCP: Janey Santos, MD                    DOB: 06-Nov-1970            DOA: 02/17/2024 FMW:[id number]             DOS: 02/17/2024, 12:15 PM  Avva, Santos, MD  Patient coming from:   HOME  I have personally reviewed patient's medical records, in electronic medical records, including:  [place] link, and care everywhere.    Chief Complaint:   Chief Complaint  Patient presents with   Weakness   Generalized Body Aches    History of present illness:    Debbie Dorsey is a 54 history of schizophrenia, DM2, HLD, GERD, and peripheral neuropathy, who presents today with a one-week history of generalized malaise, chills, and progressive weakness.  Earlier this week, the patient sought care at [facility] for similar symptoms. According to the patient and her grandfather, no definitive etiology was identified at that time; however, she was discharged on a course of oral antibiotics. Despite adherence to the medication, she reports no clinical improvement and notes that her weakness has become progressively worse.  This morning, the patient was found with significant shivering/rigors. She is currently a limited historian due to her baseline mental health status, but she denies dysuria, polyuria, or increased urinary frequency. She further denies chest pain, shortness of breath, headache, or visual changes. There are no reported focal neurological deficits, such as asymmetric weakness, and the patient remains at her baseline mental status without new confusion now.      ED Evaluation: Blood pressure 119/85, pulse (!) 108, temperature (!) 100.7 F (38.2 C), temperature source Oral, resp. rate 17, height 5' 4.8 (1.646 m), weight 60.8 kg, SpO2 100%.  LABs: VBG 7.39/32 point 05/06/2019 bicarb of 19.6 Sodium 133, glucose 303, calcium  9.5, ionized 1.13, lactic acid 2.0 >>> 2.2, WBC 3.7 Neutrophil 1.5,  beta-hydroxybutyrate 1.38, respiratory panel negative, UA > glucose otherwise unremarkable -CT head -within normal limits, chest x-ray-no acute cardiopulmonary disease    Patient Denies having: Fever, Chills, Cough, SOB, Chest Pain, Abd pain, N/V/D, headache, dizziness, lightheadedness,  Dysuria, Joint pain, rash, open wounds  ED Course:   Blood pressure (!) 181/109, pulse (!) 130, temperature (!) 100.7 F (38.2 C), temperature source Oral, resp. rate (!) 24, height 5' 4.8 (1.646 m), weight 60.8 kg, SpO2 100%. Abnormal labs;   Review of Systems: As per HPI, otherwise 10 point review of systems were negative.   ----------------------------------------------------------------------------------------------------------------------  Allergies[1]  Home MEDs:  Prior to Admission medications  Medication Sig Start Date End Date Taking? Authorizing Provider  acetaminophen  (TYLENOL ) 325 MG tablet Take 650 mg by mouth every 6 (six) hours as needed for mild pain or headache.    [provider]  albuterol  (VENTOLIN  HFA) 108 (90 Base) MCG/ACT inhaler Inhale 2 puffs into the lungs every 4 (four) hours as needed for shortness of breath or wheezing. 11/01/18   [provider]  Ascorbic Acid (VITAMIN C) 1000 MG tablet Take 1,000 mg by mouth in the morning.    [provider]  gabapentin  (NEURONTIN ) 300 MG capsule Take 1 capsule (300 mg total) by mouth 3 (three) times daily. 08/28/18   Malinda Rogue, MD  HUMALOG  KWIKPEN 100 UNIT/ML KwikPen Inject 20 Units into the skin with breakfast, with lunch, and with evening meal. If blood sugar is 190 or up-=18 units  If less than 190=0 units 10/14/20   [provider]  LANTUS  SOLOSTAR 100 UNIT/ML Solostar Pen Inject 25 Units into the skin daily. 08/11/20   [provider]  naphazoline-pheniramine (ALLERGY EYE) 0.025-0.3 % ophthalmic solution Place 1-2 drops into both eyes 4 (four) times daily as needed for eye irritation.     [provider]  ondansetron  (ZOFRAN -ODT) 4 MG disintegrating tablet Take 1 tablet (4 mg total) by mouth every 8 (eight) hours as needed for nausea or vomiting. 02/12/24   Sofia, Leslie K, PA-C  pantoprazole  (PROTONIX ) 40 MG tablet Take 1 tablet (40 mg total) by mouth 2 (two) times daily before a meal. 09/27/21   Armbruster, Elspeth SQUIBB, MD  Peppermint Oil (IBGARD) 90 MG CPCR Use as directed as needed Patient not taking: Reported on 11/09/2021 09/07/21   Armbruster, Elspeth SQUIBB, MD  perphenazine  (TRILAFON ) 4 MG tablet 1 a day 3 at hs Patient not taking: Reported on 11/09/2021 12/06/18   Malinda Rogue, MD  rosuvastatin  (CRESTOR ) 5 MG tablet Take 5 mg by mouth daily. 09/02/20   [provider]  senna-docusate (SENOKOT-S) 8.6-50 MG tablet Take 2 tablets by mouth at bedtime. For AFTER surgery, do not take if having diarrhea 11/17/20   Micheline Setter D, NP  simethicone  (GAS-X) 80 MG chewable tablet Chew 1 tablet (80 mg total) by mouth every 6 (six) hours as needed for flatulence. 09/07/21   Armbruster, Elspeth SQUIBB, MD    PRN MEDs: acetaminophen  **OR** acetaminophen , bisacodyl , hydrALAZINE , HYDROmorphone  (DILAUDID ) injection, ipratropium, ondansetron  **OR** ondansetron  (ZOFRAN ) IV, oxyCODONE , traZODone   Past Medical History:  Diagnosis Date   Asthma    Diabetes mellitus without complication (HCC)    GERD (gastroesophageal reflux disease)    History of pulmonary embolism    HLD (hyperlipidemia)    Schizophrenia (HCC)     Past Surgical History:  Procedure Laterality Date   BREAST SURGERY Right    COLONOSCOPY  09/27/2021   Dr.Armbruster   ROBOTIC ASSISTED BILATERAL SALPINGO OOPHERECTOMY N/A 11/25/2020   Procedure: XI ROBOTIC ASSISTED RIGHT SALPINGECTOMY, LEFT SALPINGOOPHORECTOMY;  Surgeon: Viktoria Comer SAUNDERS, MD;  Location: [facility];  Service: Gynecology;  Laterality: N/A;   UPPER GASTROINTESTINAL ENDOSCOPY  09/27/2021   Dr.Armbruster     reports that she has never smoked. She has never used  smokeless tobacco. She reports that she does not drink alcohol and does not use drugs.   Family History  Problem Relation Age of Onset   Breast cancer Mother    Prostate cancer Father    Mental illness Sister    Colon cancer Neg Hx    Esophageal cancer Neg Hx    Rectal cancer Neg Hx    Stomach cancer Neg Hx     Physical Exam:   Vitals:   02/17/24 0915 02/17/24 1118 02/17/24 1137 02/17/24 1145  BP:   (!) 181/109   Pulse: (!) 108  (!) 130 (!) 130  Resp: 17  (!) 24   Temp:  (!) 100.7 F (38.2 C)    TempSrc:  Oral    SpO2: 100%  100% 100%  Weight:      Height:       Constitutional: NAD, calm, comfortable Eyes: PERRL, lids and conjunctivae normal ENMT: Mucous membranes are moist. Posterior pharynx clear of any exudate or lesions.Normal dentition.  Neck: normal, supple, no  masses, no thyromegaly Respiratory: clear to auscultation bilaterally, no wheezing, no crackles. Normal respiratory effort. No accessory muscle use.  Cardiovascular: Regular rate and rhythm, no murmurs / rubs / gallops. No extremity edema. 2+ pedal pulses. No carotid bruits.  Abdomen: no tenderness, no masses palpated. No hepatosplenomegaly. Bowel sounds positive.  Musculoskeletal: no clubbing / cyanosis. No joint deformity upper and lower extremities. Good ROM, no contractures. Normal muscle tone.  Neurologic: CN II-XII grossly intact. Sensation intact, DTR normal. Strength 5/5 in all 4.  Psychiatric: Normal judgment and insight. Alert and oriented x 3. Normal mood.  Skin: no rashes, lesions, ulcers. No induration Decubitus/ulcers:  Wounds: per nursing documentation         Labs on admission:    I have personally reviewed following labs and imaging studies  CBC: Recent Labs  Lab 02/10/24 1233 02/17/24 0740 02/17/24 0803  WBC 4.8 3.7*  --   NEUTROABS 3.4 1.5*  --   HGB 14.0 14.1 14.6  HCT 41.8 41.6 43.0  MCV 95.9 96.7  --   PLT 326 568*  --    Basic Metabolic Panel: Recent Labs  Lab  02/10/24 1233 02/17/24 0740 02/17/24 0803  NA 137 131* 133*  K 4.6 4.6 4.4  CL 103 97*  --   CO2 19* 19*  --   GLUCOSE 297* 303*  --   BUN 13 11  --   CREATININE 1.09* 0.92  --   CALCIUM  9.7 9.5  --    GFR: Estimated Creatinine Clearance: 64.5 mL/min (by C-G formula based on SCr of 0.92 mg/dL). Liver Function Tests: Recent Labs  Lab 02/10/24 1233 02/17/24 0740  AST 112* 28  ALT 66* 34  ALKPHOS 62 67  BILITOT 0.5 0.8  PROT 8.1 7.6  ALBUMIN 4.5 4.5   No results for input(s): LIPASE, AMYLASE in the last 168 hours. No results for input(s): AMMONIA in the last 168 hours. Coagulation Profile: Recent Labs  Lab 02/10/24 1233 02/17/24 0740  INR 1.0 1.0   Cardiac Enzymes: No results for input(s): CKTOTAL, CKMB, CKMBINDEX, TROPONINI in the last 168 hours. BNP (last 3 results) No results for input(s): PROBNP in the last 8760 hours. HbA1C: No results for input(s): HGBA1C in the last 72 hours. CBG: Recent Labs  Lab 02/10/24 1334 02/17/24 0805  GLUCAP 246* 298*   Lipid Profile: No results for input(s): CHOL, HDL, LDLCALC, TRIG, CHOLHDL, LDLDIRECT in the last 72 hours. Thyroid  Function Tests: No results for input(s): TSH, T4TOTAL, FREET4, T3FREE, THYROIDAB in the last 72 hours. Anemia Panel: No results for input(s): VITAMINB12, FOLATE, FERRITIN, TIBC, IRON, RETICCTPCT in the last 72 hours. Urine analysis:    Component Value Date/Time   COLORURINE YELLOW 02/17/2024 0740   APPEARANCEUR CLEAR 02/17/2024 0740   LABSPEC 1.013 02/17/2024 0740   PHURINE 6.0 02/17/2024 0740   GLUCOSEU >=500 (A) 02/17/2024 0740   HGBUR NEGATIVE 02/17/2024 0740   BILIRUBINUR NEGATIVE 02/17/2024 0740   KETONESUR 20 (A) 02/17/2024 0740   PROTEINUR NEGATIVE 02/17/2024 0740   NITRITE NEGATIVE 02/17/2024 0740   LEUKOCYTESUR NEGATIVE 02/17/2024 0740    Last A1C:  Lab Results  Component Value Date   HGBA1C 8.5 (H) 11/17/2020      Radiologic Exams on Admission:   CT Head Wo Contrast Result Date: 02/17/2024 EXAM: CT HEAD WITHOUT CONTRAST 02/17/2024 08:45:27 AM TECHNIQUE: CT of the head was performed without the administration of intravenous contrast. Automated exposure control, iterative reconstruction, and/or weight based adjustment of the mA/kV was utilized to  reduce the radiation dose to as low as reasonably achievable. COMPARISON: Head CT 11/01/2018. CLINICAL HISTORY: 54 year old female with new onset headache, weakness, and body aches. FINDINGS: Streak artifact related to hair items along the posterior convexity. BRAIN AND VENTRICLES: No acute hemorrhage. No evidence of acute infarct. No hydrocephalus. No extra-axial collection. No mass effect or midline shift. Brain volume remains within normal limits for age. When allowing for streak artifact, gray-white differentiation remains within normal limits. Faint vascular calcification at the skull base. No suspicious intracranial vascular hyperdensity. ORBITS: No acute abnormality. SINUSES: Visible paranasal sinuses are clear. Tympanic cavities and mastoids are clear. SOFT TISSUES AND SKULL: No acute soft tissue abnormality. No skull fracture. IMPRESSION: 1. Normal for age non-contrast head CT when allowing for streak artifact. Electronically signed by: Helayne Hurst MD MD 02/17/2024 08:51 AM EST RP Workstation: [id number]   DG Chest Port 1 View Result Date: 02/17/2024 EXAM: 1 VIEW XRAY OF THE CHEST 02/17/2024 08:11:17 AM COMPARISON: 02/10/2024 CLINICAL HISTORY: 54 year old female. Questionable sepsis - evaluate for abnormality. FINDINGS: LUNGS AND PLEURA: No focal pulmonary opacity. No pleural effusion. No pneumothorax. HEART AND MEDIASTINUM: No acute abnormality of the cardiac and mediastinal silhouettes. BONES AND SOFT TISSUES: No acute osseous abnormality. IMPRESSION: 1. No acute cardiopulmonary abnormality. Electronically signed by: Helayne Hurst MD MD 02/17/2024 08:24 AM EST RP  Workstation: [id number]    EKG:   Independently reviewed.  Orders placed or performed during the hospital encounter of 02/17/24   ED EKG   ED EKG   EKG 12-Lead   EKG 12-Lead   EKG 12-Lead   ---------------------------------------------------------------------------------------------------------------------------------------    Assessment / Plan:   Principal Problem:   Sepsis (HCC) Active Problems:   Acute encephalopathy   Paranoid schizophrenia (HCC)   Type 2 diabetes mellitus (HCC)   DKA (diabetic ketoacidosis) (HCC)   Hyperlipidemia   GERD (gastroesophageal reflux disease)   Assessment and Plan: * Sepsis (HCC) Meeting SIRS, early sepsis criteria:  Tmax 100.7, HR 112, lactic acid 2.0, 2.2, WBC 3.7,  -Unknown etiology, unknown source of infection -UA chest x-ray head imaging negative -Per sepsis protocol, will continue with IV fluid resuscitation and broad-spectrum antibiotics (cefepime /Flagyl /vancomycin ) -Will follow-up with cultures-narrow down antibiotics in next 24-48 hours -Obtaining procalcitonin level, ESR, CRP, Will monitor closely  Acute encephalopathy Generalized metabolic encephalopathy Ruling out sepsis, underlying infection versus polypharmacy, with underlying history of schizophrenia -Mentation has improved -Will follow closely   DKA (diabetic ketoacidosis) (HCC) Does not quite meet quite DKA criteria -Although blood glucose levels 303, lactic acid 2.0, 2.2, mild hyponatremia with sodium 131, beta-hydroxybutyrate elevated at 1.38, Anion gap 15 - Will continue with aggressive fluid resuscitation -Will initiate sliding scale insulin , will check CBG every hour's then every 4 hours given then  ACHS - with SSI coverage - Resume long-acting insulin  25 >> 30 U nightly -Last A1c 8.5, repeating   Type 2 diabetes mellitus (HCC) Uncontrolled diabetes mellitus type 2, with last A1c 8.5 Review home medication, titrating for better glycemic  control -Currently listed  - 25 units long-acting insulin  Lantus  nightly,  - Humalog  20 units 3 times daily   Paranoid schizophrenia (HCC) Mentation and mood stable -Reviewing home medication -Continue Neurontin ,  (Not on any other medications at home)  GERD (gastroesophageal reflux disease) Continue PPI  Hyperlipidemia Continue rosuvastatin               Consults called:  None -------------------------------------------------------------------------------------------------------------------------------------------- DVT prophylaxis:  heparin  injection 5,000 Units Start: 02/17/24 1400 SCDs Start: 02/17/24 1142   Code Status:  Code Status: Full Code   Admission status: Patient will be admitted as Observation, with a greater than 2 midnight length of stay. Level of care: Telemetry   Family Communication:  none at bedside  (The above findings and plan of care has been discussed with patient in detail, the patient expressed understanding and agreement of above plan)  --------------------------------------------------------------------------------------------------------------------------------------------------  Disposition Plan:  Anticipated 1-2 days Status is: Observation The patient remains OBS appropriate and will d/c before 2 midnights.     ----------------------------------------------------------------------------------------------------------------------------------------------------  Time spent:  66  Min.  Was spent seeing and evaluating the patient, reviewing all medical records, drawn plan of care.  SIGNED: Adriana DELENA Grams, MD, FHM. FAAFP. [place] [facility], Pager  (Please use [email or web address] to page/ or secure chat through epic) If 7PM-7AM, please contact night-coverage [email or web address],  02/17/2024, 12:15 PM     [1] No Known Allergies  "

## 2024-02-17 NOTE — ED Triage Notes (Signed)
 BIB EMS from home r/t Body aches and weakness that started a few days ago. Endorsing N/V. Denies fevers. A&Ox4.

## 2024-02-17 NOTE — Progress Notes (Signed)
 Pharmacy Antibiotic Note  Debbie Dorsey is a 54 y.o. female admitted on 02/17/2024 with concern for sepsis.  Pharmacy has been consulted for vancomycin  dosing.  Plan: Vancomycin  1250 mg IV x 1, then 500 mg IV q 12h (eAUC 438) Monitor renal function, Cx and clinical progression to narrow Vancomycin  levels as indicated  Height: 5' 4.8 (164.6 cm) Weight: 60.8 kg (134 lb 0.6 oz) IBW/kg (Calculated) : 56.54  Temp (24hrs), Avg:99.6 F (37.6 C), Min:98.5 F (36.9 C), Max:100.7 F (38.2 C)  Recent Labs  Lab 02/10/24 1233 02/10/24 1239 02/10/24 1615 02/17/24 0740 02/17/24 0804 02/17/24 0931  WBC 4.8  --   --  3.7*  --   --   CREATININE 1.09*  --   --  0.92  --   --   LATICACIDVEN  --  3.7* 1.4  --  2.0* 2.2*    Estimated Creatinine Clearance: 64.5 mL/min (by C-G formula based on SCr of 0.92 mg/dL).    Allergies[1]  Dorn Poot, PharmD, [facility] Clinical Pharmacist ED Pharmacist Phone # [phone] 02/17/2024 11:50 AM      [1] No Known Allergies

## 2024-02-17 NOTE — ED Provider Notes (Signed)
 " [place] [facility] AT [facility] Provider Note   CSN: [id number] Arrival date & time: 02/17/24  9287     Patient presents with: Weakness and Generalized Body Aches   Debbie Dorsey is a 54 y.o. female.   HPI 54 year old female presents with shaking this morning.  History is primarily from the grandfather over the phone, who is her power of attorney as well.  The patient request that I talk to him first.  The patient has been ill for almost a week.  She had a fever earlier in the course but no longer.  She has been having a little bit of a headache as well although she denies a headache currently.  She has been having some shaking and shaking this morning is what led EMS to be called this morning.  She had vomiting earlier in the course but that has resolved.  She went to [facility] on 1/4 and was prescribed antibiotics but it does not seem like she got any better.  Apolinar is concerned because she is weaker than normal and not as active and laying around more.  Based on my conversation with him it does not seem like she is confused compared to her baseline but more just less active.  Prior to Admission medications  Medication Sig Start Date End Date Taking? Authorizing Provider  acetaminophen  (TYLENOL ) 325 MG tablet Take 650 mg by mouth every 6 (six) hours as needed for mild pain or headache.    [provider]  albuterol  (VENTOLIN  HFA) 108 (90 Base) MCG/ACT inhaler Inhale 2 puffs into the lungs every 4 (four) hours as needed for shortness of breath or wheezing. 11/01/18   [provider]  Ascorbic Acid (VITAMIN C) 1000 MG tablet Take 1,000 mg by mouth in the morning.    [provider]  gabapentin  (NEURONTIN ) 300 MG capsule Take 1 capsule (300 mg total) by mouth 3 (three) times daily. 08/28/18   Malinda Rogue, MD  HUMALOG  KWIKPEN 100 UNIT/ML KwikPen Inject 20 Units into the skin with breakfast, with lunch, and with evening meal. If blood  sugar is 190 or up-=18 units  If less than 190=0 units 10/14/20   [provider]  LANTUS  SOLOSTAR 100 UNIT/ML Solostar Pen Inject 25 Units into the skin daily. 08/11/20   [provider]  naphazoline-pheniramine (ALLERGY EYE) 0.025-0.3 % ophthalmic solution Place 1-2 drops into both eyes 4 (four) times daily as needed for eye irritation.    [provider]  ondansetron  (ZOFRAN -ODT) 4 MG disintegrating tablet Take 1 tablet (4 mg total) by mouth every 8 (eight) hours as needed for nausea or vomiting. 02/12/24   Sofia, Leslie K, PA-C  pantoprazole  (PROTONIX ) 40 MG tablet Take 1 tablet (40 mg total) by mouth 2 (two) times daily before a meal. 09/27/21   Armbruster, Elspeth SQUIBB, MD  Peppermint Oil (IBGARD) 90 MG CPCR Use as directed as needed Patient not taking: Reported on 11/09/2021 09/07/21   Leigh Elspeth SQUIBB, MD  perphenazine  (TRILAFON ) 4 MG tablet 1 a day 3 at hs Patient not taking: Reported on 11/09/2021 12/06/18   Malinda Rogue, MD  rosuvastatin  (CRESTOR ) 5 MG tablet Take 5 mg by mouth daily. 09/02/20   [provider]  senna-docusate (SENOKOT-S) 8.6-50 MG tablet Take 2 tablets by mouth at bedtime. For AFTER surgery, do not take if having diarrhea 11/17/20   Micheline Setter D, NP  simethicone  (GAS-X) 80 MG chewable tablet Chew 1 tablet (80 mg total)  by mouth every 6 (six) hours as needed for flatulence. 09/07/21   Armbruster, Elspeth SQUIBB, MD    Allergies: Patient has no known allergies.    Review of Systems  Respiratory:  Negative for cough and shortness of breath.   Gastrointestinal:  Negative for abdominal pain.  Neurological:  Positive for tremors and headaches.    Updated Vital Signs BP (!) 181/109   Pulse (!) 130   Temp (!) 100.7 F (38.2 C) (Oral)   Resp (!) 24   Ht 5' 4.8 (1.646 m)   Wt 60.8 kg   LMP  (LMP Unknown)   SpO2 100%   BMI 22.44 kg/m   Physical Exam Vitals and nursing note reviewed.  Constitutional:      General: She is not in acute  distress.    Appearance: She is well-developed. She is not ill-appearing or diaphoretic.  HENT:     Head: Normocephalic and atraumatic.  Eyes:     Extraocular Movements: Extraocular movements intact.  Cardiovascular:     Rate and Rhythm: Regular rhythm. Tachycardia present.     Heart sounds: Normal heart sounds.  Pulmonary:     Effort: Pulmonary effort is normal.     Breath sounds: Normal breath sounds. No wheezing or rales.  Abdominal:     Palpations: Abdomen is soft.     Tenderness: There is no abdominal tenderness.  Musculoskeletal:     Cervical back: Normal range of motion. No rigidity.  Skin:    General: Skin is warm and dry.  Neurological:     Mental Status: She is alert and oriented to person, place, and time.     Comments: CN 3-12 grossly intact. 5/5 strength in all 4 extremities. Normal finger to nose.      (all labs ordered are listed, but only abnormal results are displayed) Labs Reviewed  COMPREHENSIVE METABOLIC PANEL WITH GFR - Abnormal; Notable for the following components:      Result Value   Sodium 131 (*)    Chloride 97 (*)    CO2 19 (*)    Glucose, Bld 303 (*)    All other components within normal limits  CBC WITH DIFFERENTIAL/PLATELET - Abnormal; Notable for the following components:   WBC 3.7 (*)    Platelets 568 (*)    Neutro Abs 1.5 (*)    All other components within normal limits  URINALYSIS, W/ REFLEX TO CULTURE (INFECTION SUSPECTED) - Abnormal; Notable for the following components:   Glucose, UA >=500 (*)    Ketones, ur 20 (*)    All other components within normal limits  BETA-HYDROXYBUTYRIC ACID - Abnormal; Notable for the following components:   Beta-Hydroxybutyric Acid 1.38 (*)    All other components within normal limits  I-STAT VENOUS BLOOD GAS, ED - Abnormal; Notable for the following components:   pCO2, Ven 32.3 (*)    pO2, Ven 30 (*)    Bicarbonate 19.6 (*)    TCO2 21 (*)    Acid-base deficit 4.0 (*)    Sodium 133 (*)    Calcium ,  Ion 1.13 (*)    All other components within normal limits  CBG MONITORING, ED - Abnormal; Notable for the following components:   Glucose-Capillary 298 (*)    All other components within normal limits  I-STAT CG4 LACTIC ACID, ED - Abnormal; Notable for the following components:   Lactic Acid, Venous 2.0 (*)    All other components within normal limits  I-STAT CG4 LACTIC ACID, ED - Abnormal;  Notable for the following components:   Lactic Acid, Venous 2.2 (*)    All other components within normal limits  RESP PANEL BY RT-PCR (RSV, FLU A&B, COVID)  RVPGX2  CULTURE, BLOOD (ROUTINE X 2)  CULTURE, BLOOD (ROUTINE X 2)  URINE CULTURE  EXPECTORATED SPUTUM ASSESSMENT W GRAM STAIN, RFLX TO RESP C  PROTIME-INR  HCG, SERUM, QUALITATIVE  HIV ANTIBODY (ROUTINE TESTING W REFLEX)  MAGNESIUM   PHOSPHORUS  PROTIME-INR  APTT  SEDIMENTATION RATE  C-REACTIVE PROTEIN  I-STAT CG4 LACTIC ACID, ED  TROPONIN T, HIGH SENSITIVITY  TROPONIN T, HIGH SENSITIVITY    EKG: EKG Interpretation Date/Time:  Sunday February 17 2024 08:04:18 EST Ventricular Rate:  106 PR Interval:  156 QRS Duration:  78 QT Interval:  334 QTC Calculation: 444 R Axis:   75  Text Interpretation: Sinus tachycardia Probable anteroseptal infarct, old Confirmed by Freddi Hamilton [phone]) on 02/17/2024 8:32:59 AM  Radiology: CT Head Wo Contrast Result Date: 02/17/2024 EXAM: CT HEAD WITHOUT CONTRAST 02/17/2024 08:45:27 AM TECHNIQUE: CT of the head was performed without the administration of intravenous contrast. Automated exposure control, iterative reconstruction, and/or weight based adjustment of the mA/kV was utilized to reduce the radiation dose to as low as reasonably achievable. COMPARISON: Head CT 11/01/2018. CLINICAL HISTORY: 54 year old female with new onset headache, weakness, and body aches. FINDINGS: Streak artifact related to hair items along the posterior convexity. BRAIN AND VENTRICLES: No acute hemorrhage. No evidence of  acute infarct. No hydrocephalus. No extra-axial collection. No mass effect or midline shift. Brain volume remains within normal limits for age. When allowing for streak artifact, gray-white differentiation remains within normal limits. Faint vascular calcification at the skull base. No suspicious intracranial vascular hyperdensity. ORBITS: No acute abnormality. SINUSES: Visible paranasal sinuses are clear. Tympanic cavities and mastoids are clear. SOFT TISSUES AND SKULL: No acute soft tissue abnormality. No skull fracture. IMPRESSION: 1. Normal for age non-contrast head CT when allowing for streak artifact. Electronically signed by: Helayne Hurst MD MD 02/17/2024 08:51 AM EST RP Workstation: [id number]   DG Chest Port 1 View Result Date: 02/17/2024 EXAM: 1 VIEW XRAY OF THE CHEST 02/17/2024 08:11:17 AM COMPARISON: 02/10/2024 CLINICAL HISTORY: 54 year old female. Questionable sepsis - evaluate for abnormality. FINDINGS: LUNGS AND PLEURA: No focal pulmonary opacity. No pleural effusion. No pneumothorax. HEART AND MEDIASTINUM: No acute abnormality of the cardiac and mediastinal silhouettes. BONES AND SOFT TISSUES: No acute osseous abnormality. IMPRESSION: 1. No acute cardiopulmonary abnormality. Electronically signed by: Helayne Hurst MD MD 02/17/2024 08:24 AM EST RP Workstation: [id number]     .Critical Care  Performed by: Freddi Hamilton, MD Authorized by: Freddi Hamilton, MD   Critical care provider statement:    Critical care time (minutes):  40   Critical care time was exclusive of:  Separately billable procedures and treating other patients   Critical care was necessary to treat or prevent imminent or life-threatening deterioration of the following conditions:  Sepsis and dehydration   Critical care was time spent personally by me on the following activities:  Development of treatment plan with patient or surrogate, discussions with consultants, evaluation of patient's response to treatment, examination  of patient, ordering and review of laboratory studies, ordering and review of radiographic studies, ordering and performing treatments and interventions, pulse oximetry, re-evaluation of patient's condition and review of old charts    Medications Ordered in the ED  lactated ringers  infusion (has no administration in time range)  heparin  injection 5,000 Units (has no administration in time range)  sodium chloride   flush (NS) 0.9 % injection 3 mL (has no administration in time range)  0.9 %  sodium chloride  infusion (has no administration in time range)  sodium chloride  flush (NS) 0.9 % injection 3 mL (has no administration in time range)  acetaminophen  (TYLENOL ) tablet 650 mg (has no administration in time range)    Or  acetaminophen  (TYLENOL ) suppository 650 mg (has no administration in time range)  oxyCODONE  (Oxy IR/ROXICODONE ) immediate release tablet 5 mg (has no administration in time range)  HYDROmorphone  (DILAUDID ) injection 0.5-1 mg (has no administration in time range)  traZODone  (DESYREL ) tablet 25 mg (has no administration in time range)  senna-docusate (Senokot-S) tablet 1 tablet (has no administration in time range)  bisacodyl  (DULCOLAX) EC tablet 5 mg (has no administration in time range)  sodium phosphate  (FLEET) enema 1 enema (has no administration in time range)  ondansetron  (ZOFRAN ) tablet 4 mg (has no administration in time range)    Or  ondansetron  (ZOFRAN ) injection 4 mg (has no administration in time range)  ipratropium (ATROVENT ) nebulizer solution 0.5 mg (has no administration in time range)  hydrALAZINE  (APRESOLINE ) injection 10 mg (has no administration in time range)  lactated ringers  infusion (has no administration in time range)  lactated ringers  bolus 1,000 mL (has no administration in time range)    And  lactated ringers  bolus 1,000 mL (has no administration in time range)  metroNIDAZOLE  (FLAGYL ) IVPB 500 mg (has no administration in time range)  vancomycin   (VANCOREADY) IVPB 1250 mg/250 mL (has no administration in time range)  ceFEPIme  (MAXIPIME ) 2 g in sodium chloride  0.9 % 100 mL IVPB (has no administration in time range)  vancomycin  (VANCOREADY) IVPB 500 mg/100 mL (has no administration in time range)  lactated ringers  bolus 1,000 mL (0 mLs Intravenous Stopped 02/17/24 1121)  lactated ringers  bolus 1,000 mL (0 mLs Intravenous Stopped 02/17/24 1121)                                    Medical Decision Making Amount and/or Complexity of Data Reviewed Labs: ordered.    Details: Elevated lactic acid.  Hyperglycemia with elevated beta hydroxy.  Mild leukopenia Radiology: ordered and independent interpretation performed.    Details: No head bleed.  No pneumonia ECG/medicine tests: ordered and independent interpretation performed.    Details: Sinus tachycardia  Risk Prescription drug management. Decision regarding hospitalization.   Patient presents with generalized weakness.  No focal symptoms on exam/history.  She is not a great historian but based on talking to grandfather and examining patient, does not seem like she is acutely altered.  Did spike a fever here.  Workup is borderline for DKA but with the fever and elevated lactate, will treat broadly for possible sepsis.  She will need admission.  No other focal findings on exam, no meningismus.  Discussed with Dr. Willette for admission.     Final diagnoses:  Sepsis, due to unspecified organism, unspecified whether acute organ dysfunction present [facility])    ED Discharge Orders     None          Freddi Hamilton, MD 02/17/24 1534  "

## 2024-02-17 NOTE — Assessment & Plan Note (Signed)
 Mentation and mood stable -Reviewing home medication -Continue Neurontin ,  (Not on any other medications at home)

## 2024-02-17 NOTE — Assessment & Plan Note (Signed)
 Uncontrolled diabetes mellitus type 2, with last A1c 8.5 Review home medication, titrating for better glycemic control -Currently listed  - 25 units long-acting insulin  Lantus  nightly,  - Humalog  20 units 3 times daily

## 2024-02-17 NOTE — Progress Notes (Signed)
 Elink following for sepsis protocol.

## 2024-02-17 NOTE — Assessment & Plan Note (Signed)
 Continue PPI

## 2024-02-18 ENCOUNTER — Inpatient Hospital Stay (HOSPITAL_COMMUNITY)

## 2024-02-18 ENCOUNTER — Observation Stay (HOSPITAL_COMMUNITY)

## 2024-02-18 DIAGNOSIS — R339 Retention of urine, unspecified: Secondary | ICD-10-CM | POA: Diagnosis present

## 2024-02-18 DIAGNOSIS — K219 Gastro-esophageal reflux disease without esophagitis: Secondary | ICD-10-CM | POA: Diagnosis present

## 2024-02-18 DIAGNOSIS — Z794 Long term (current) use of insulin: Secondary | ICD-10-CM | POA: Diagnosis not present

## 2024-02-18 DIAGNOSIS — E1142 Type 2 diabetes mellitus with diabetic polyneuropathy: Secondary | ICD-10-CM | POA: Diagnosis present

## 2024-02-18 DIAGNOSIS — A419 Sepsis, unspecified organism: Secondary | ICD-10-CM | POA: Diagnosis present

## 2024-02-18 DIAGNOSIS — J45909 Unspecified asthma, uncomplicated: Secondary | ICD-10-CM | POA: Diagnosis present

## 2024-02-18 DIAGNOSIS — R6510 Systemic inflammatory response syndrome (SIRS) of non-infectious origin without acute organ dysfunction: Secondary | ICD-10-CM | POA: Diagnosis present

## 2024-02-18 DIAGNOSIS — Z1152 Encounter for screening for COVID-19: Secondary | ICD-10-CM | POA: Diagnosis not present

## 2024-02-18 DIAGNOSIS — R6520 Severe sepsis without septic shock: Secondary | ICD-10-CM | POA: Diagnosis not present

## 2024-02-18 DIAGNOSIS — F200 Paranoid schizophrenia: Secondary | ICD-10-CM | POA: Diagnosis present

## 2024-02-18 DIAGNOSIS — B9729 Other coronavirus as the cause of diseases classified elsewhere: Secondary | ICD-10-CM | POA: Diagnosis present

## 2024-02-18 DIAGNOSIS — E871 Hypo-osmolality and hyponatremia: Secondary | ICD-10-CM | POA: Diagnosis present

## 2024-02-18 DIAGNOSIS — J9600 Acute respiratory failure, unspecified whether with hypoxia or hypercapnia: Secondary | ICD-10-CM | POA: Diagnosis not present

## 2024-02-18 DIAGNOSIS — E785 Hyperlipidemia, unspecified: Secondary | ICD-10-CM | POA: Diagnosis present

## 2024-02-18 DIAGNOSIS — Z79899 Other long term (current) drug therapy: Secondary | ICD-10-CM | POA: Diagnosis not present

## 2024-02-18 DIAGNOSIS — E1110 Type 2 diabetes mellitus with ketoacidosis without coma: Secondary | ICD-10-CM | POA: Diagnosis present

## 2024-02-18 DIAGNOSIS — G9341 Metabolic encephalopathy: Secondary | ICD-10-CM | POA: Diagnosis present

## 2024-02-18 DIAGNOSIS — Z818 Family history of other mental and behavioral disorders: Secondary | ICD-10-CM | POA: Diagnosis not present

## 2024-02-18 DIAGNOSIS — E1100 Type 2 diabetes mellitus with hyperosmolarity without nonketotic hyperglycemic-hyperosmolar coma (NKHHC): Secondary | ICD-10-CM | POA: Diagnosis present

## 2024-02-18 DIAGNOSIS — Z803 Family history of malignant neoplasm of breast: Secondary | ICD-10-CM | POA: Diagnosis not present

## 2024-02-18 DIAGNOSIS — J069 Acute upper respiratory infection, unspecified: Secondary | ICD-10-CM | POA: Diagnosis present

## 2024-02-18 DIAGNOSIS — E860 Dehydration: Secondary | ICD-10-CM | POA: Diagnosis present

## 2024-02-18 DIAGNOSIS — Z86711 Personal history of pulmonary embolism: Secondary | ICD-10-CM | POA: Diagnosis not present

## 2024-02-18 DIAGNOSIS — Z8042 Family history of malignant neoplasm of prostate: Secondary | ICD-10-CM | POA: Diagnosis not present

## 2024-02-18 LAB — CBC
HCT: 35.4 % — ABNORMAL LOW (ref 36.0–46.0)
Hemoglobin: 12.2 g/dL (ref 12.0–15.0)
MCH: 32.5 pg (ref 26.0–34.0)
MCHC: 34.5 g/dL (ref 30.0–36.0)
MCV: 94.4 fL (ref 80.0–100.0)
Platelets: 503 K/uL — ABNORMAL HIGH (ref 150–400)
RBC: 3.75 MIL/uL — ABNORMAL LOW (ref 3.87–5.11)
RDW: 13.2 % (ref 11.5–15.5)
WBC: 4.3 K/uL (ref 4.0–10.5)
nRBC: 0 % (ref 0.0–0.2)

## 2024-02-18 LAB — RESPIRATORY PANEL BY PCR

## 2024-02-18 LAB — COMPREHENSIVE METABOLIC PANEL WITH GFR
ALT: 28 U/L (ref 0–44)
AST: 33 U/L (ref 15–41)
Albumin: 4 g/dL (ref 3.5–5.0)
Alkaline Phosphatase: 56 U/L (ref 38–126)
Anion gap: 12 (ref 5–15)
BUN: 8 mg/dL (ref 6–20)
CO2: 23 mmol/L (ref 22–32)
Calcium: 9.3 mg/dL (ref 8.9–10.3)
Chloride: 99 mmol/L (ref 98–111)
Creatinine, Ser: 0.7 mg/dL (ref 0.44–1.00)
GFR, Estimated: >60 mL/min
Glucose, Bld: 95 mg/dL (ref 70–99)
Potassium: 4 mmol/L (ref 3.5–5.1)
Sodium: 134 mmol/L — ABNORMAL LOW (ref 135–145)
Total Bilirubin: 0.6 mg/dL (ref 0.0–1.2)
Total Protein: 6.7 g/dL (ref 6.5–8.1)

## 2024-02-18 LAB — GLUCOSE, CAPILLARY
Glucose-Capillary: 146 mg/dL — ABNORMAL HIGH (ref 70–99)
Glucose-Capillary: 181 mg/dL — ABNORMAL HIGH (ref 70–99)
Glucose-Capillary: 135 mg/dL — ABNORMAL HIGH (ref 70–99)
Glucose-Capillary: 155 mg/dL — ABNORMAL HIGH (ref 70–99)
Glucose-Capillary: 172 mg/dL — ABNORMAL HIGH (ref 70–99)

## 2024-02-18 LAB — MAGNESIUM: Magnesium: 2 mg/dL (ref 1.7–2.4)

## 2024-02-18 LAB — PROCALCITONIN: Procalcitonin: <0.1 ng/mL

## 2024-02-18 LAB — C-REACTIVE PROTEIN: CRP: <0.5 mg/dL

## 2024-02-18 MED ORDER — POLYETHYLENE GLYCOL 3350 17 G PO PACK
17.0000 g | PACK | Freq: Two times a day (BID) | ORAL | Status: DC
  Administered 2024-02-18 – 2024-02-21 (×6): 17 g via ORAL
  Filled 2024-02-18 (×5): qty 1

## 2024-02-18 MED ORDER — CHLORHEXIDINE GLUCONATE CLOTH 2 % EX PADS
6.0000 | MEDICATED_PAD | Freq: Every day | CUTANEOUS | Status: DC
  Administered 2024-02-19 – 2024-02-21 (×3): 6 via TOPICAL

## 2024-02-18 MED ORDER — IOHEXOL 350 MG/ML SOLN
75.0000 mL | Freq: Once | INTRAVENOUS | Status: AC | PRN
  Administered 2024-02-18: 75 mL via INTRAVENOUS

## 2024-02-18 MED ORDER — DOXYCYCLINE HYCLATE 100 MG PO TABS
100.0000 mg | ORAL_TABLET | Freq: Two times a day (BID) | ORAL | Status: AC
  Administered 2024-02-18 – 2024-02-19 (×4): 100 mg via ORAL
  Filled 2024-02-18 (×4): qty 1

## 2024-02-18 MED ORDER — SODIUM PHOSPHATES 45 MMOLE/15ML IV SOLN
30.0000 mmol | Freq: Once | INTRAVENOUS | Status: AC
  Administered 2024-02-18: 30 mmol via INTRAVENOUS
  Filled 2024-02-18: qty 10

## 2024-02-18 MED ORDER — LACTATED RINGERS IV SOLN: INTRAVENOUS | Status: DC

## 2024-02-18 MED ORDER — LACTATED RINGERS IV BOLUS
500.0000 mL | Freq: Once | INTRAVENOUS | Status: AC
  Administered 2024-02-18: 500 mL via INTRAVENOUS

## 2024-02-18 NOTE — Plan of Care (Signed)

## 2024-02-18 NOTE — TOC Initial Note (Addendum)
 Transition of Care [facility]) - Initial/Assessment Note    Patient Details  Name: Debbie Dorsey MRN: [id number] Date of Birth: Jun 05, 1970  [facility]) CM/SW Contact:    Landry DELENA Senters, RN Phone Number: 02/18/2024, 3:21 PM  Clinical Narrative:                 RR:pdunmb of schizophrenia, DM2, HLD, GERD, and peripheral neuropathy, who presents today with a one-week history of generalized malaise, chills, and progressive weakness.  Earlier this week, the patient sought care at [facility] for similar symptoms. According to the patient and her grandfather, no definitive etiology was identified at that time; however, she was discharged on a course of oral antibiotics. Despite adherence to the medication, she reports no clinical improvement and notes that her weakness has become progressively worse.  Patient with confusion so unable to provide hx. CM contacted patient's grandfather to confirm home hx.   Patient lives with grandfather, who provides support at home and will transport home at d/c. Patient has PCP, normally manages her own medications, no DME at home, grandfather normally drives patient to appts.   Currently waiting for therapy eval.  CM will continue to follow.  Expected Discharge Plan:  (TBD) Barriers to Discharge: Continued Medical Work up   Patient Goals and CMS Choice            Expected Discharge Plan and Services       Living arrangements for the past 2 months: Single Family Home                                      Prior Living Arrangements/Services Living arrangements for the past 2 months: Single Family Home Lives with:: Self, Other (Comment) (grandfather) Patient language and need for interpreter reviewed:: Yes Do you feel safe going back to the place where you live?: Yes      Need for Family Participation in Patient Care: Yes (Comment) Care giver support system in place?: Yes (comment)   Criminal Activity/Legal Involvement  Pertinent to Current Situation/Hospitalization: No - Comment as needed  Activities of Daily Living   ADL Screening (condition at time of admission) Independently performs ADLs?: Yes (appropriate for developmental age) (needs som eassistance) Is the patient deaf or have difficulty hearing?: No Does the patient have difficulty seeing, even when wearing glasses/contacts?: No Does the patient have difficulty concentrating, remembering, or making decisions?: No  Permission Sought/Granted                  Emotional Assessment Appearance:: Developmentally appropriate Attitude/Demeanor/Rapport: Engaged Affect (typically observed): Calm Orientation: : Oriented to Self Alcohol / Substance Use: Not Applicable Psych Involvement: No (comment)  Admission diagnosis:  SIRS (systemic inflammatory response syndrome) (HCC) [R65.10] Sepsis, due to unspecified organism, unspecified whether acute organ dysfunction present [facility]) [A41.9] Patient Active Problem List   Diagnosis Date Noted   SIRS (systemic inflammatory response syndrome) (HCC) 02/18/2024   Sepsis (HCC) 02/17/2024   Hyperlipidemia 02/17/2024   GERD (gastroesophageal reflux disease) 02/17/2024   Pelvic mass    Schizophrenia (HCC) 07/30/2018   DKA (diabetic ketoacidosis) (HCC) 07/29/2018   Acute encephalopathy 05/25/2018   Acute pulmonary embolism (HCC) 05/25/2018   Type 2 diabetes mellitus (HCC) 05/25/2018   Psychosis (HCC) 04/08/2018   Paranoid schizophrenia (HCC) 04/02/2018   PCP:  Janey Santos, MD Pharmacy:   [facility] [phone] - [place], [place] - [address] AT [facility] [address] [place] [address] Phone: [phone] Fax: [phone]     Social Drivers of Health (SDOH) Social History: SDOH Screenings   Food Insecurity: No Food Insecurity (02/17/2024)  Housing: Low Risk (02/17/2024)  Transportation Needs: No Transportation Needs (02/17/2024)  Utilities: Not  At Risk (02/17/2024)  Social Connections: Socially Isolated (02/17/2024)  Tobacco Use: Low Risk (02/17/2024)   SDOH Interventions:     Readmission Risk Interventions     No data to display

## 2024-02-18 NOTE — Progress Notes (Addendum)
 "                                                                                                                                                                                                                                                                                PROGRESS NOTE     Patient Demographics:    Debbie Dorsey, is a 54 y.o. female, DOB - Sep 21, 1970, FMW:[id number]  Outpatient Primary MD for the patient is Avva, Ravisankar, MD    LOS - 0  Admit date - 02/17/2024    Chief Complaint  Patient presents with   Weakness   Generalized Body Aches       Brief Narrative (HPI from H&P)    53 history of schizophrenia, DM2, HLD, GERD, and peripheral neuropathy, who presents today with a one-week history of generalized malaise, chills, and progressive weakness.  Earlier this week, the patient sought care at [facility] for similar symptoms. According to the patient and her grandfather, no definitive etiology was identified at that time; however, she was discharged on a course of oral antibiotics. Despite adherence to the medication, she reports no clinical improvement and notes that her weakness has become progressively worse.  This morning, the patient was found with significant shivering/rigors. She is currently a limited historian due to her baseline mental health status, but she denies dysuria, polyuria, or increased urinary frequency. She further denies chest pain, shortness of breath, headache, or visual changes. There are no reported focal neurological deficits, such as asymmetric weakness, and the patient remains at her baseline mental status without new confusion now.     Subjective:    Debbie Dorsey today has, No headache, No chest pain, No abdominal pain - No Nausea, No new weakness tingling or numbness, no shortness of breath   Assessment  & Plan :   Sepsis (HCC) of unclear origin, twice in a few weeks, acute metabolic encephalopathy due to combination of sepsis and  nonketotic hyperosmolar state.  No clear source of infection, stable inflammatory markers, initial viral panel negative, of note she was admitted to [facility] long hospital for sepsis of unclear origin few weeks ago, currently on empiric antibiotics sepsis pathophysiology has resolved,  mentation back to normal, no headache or focal deficits, head CT unremarkable.  For now we will we will de-escalate antibiotics. Will obtain an extended viral panel, monitor cultures, check CT chest abdomen pelvis to rule out any infectious source.  Paranoid schizophrenia (HCC)  Mentation and mood stable, no acute issues, on Neurontin  only for now which will be continued.   GERD (gastroesophageal reflux disease)  Continue PPI   Hyperlipidemia Continue rosuvastatin   Nonketotic hyperosmolar state in a patient with type 2 diabetes mellitus (HCC) poor outpatient control due to high A1c, diabetic education.  Insulin  registered on 02/18/2024 for better control.     Lab Results  Component Value Date   HGBA1C 9.9 (H) 02/17/2024   CBG (last 3)  Recent Labs    02/17/24 2015 02/18/24 0807 02/18/24 0946  GLUCAP 234* 146* 181*        Condition - Fair  Family Communication  : Family member sleeping bedside on 02/18/2024, updated [name] patient's grandfather [phone] was updated 02/18/2024  Code Status :  Full  Consults  :  DM educator  PUD Prophylaxis :  PPI   Procedures  :     CT head.  Nonacute      Disposition Plan  :    Status is: Inpatient   DVT Prophylaxis  :    heparin  injection 5,000 Units Start: 02/17/24 1400 SCDs Start: 02/17/24 1142    Lab Results  Component Value Date   PLT 503 (H) 02/18/2024    Diet :  Diet Order             Diet Carb Modified Room service appropriate? Yes  Diet effective now                    Inpatient Medications  Scheduled Meds:  doxycycline   100 mg Oral Q12H   gabapentin   300 mg Oral TID   heparin   5,000 Units Subcutaneous Q8H   insulin   aspart  0-20 Units Subcutaneous TID WC   insulin  aspart  0-5 Units Subcutaneous QHS   insulin  glargine  30 Units Subcutaneous QHS   lactobacillus  1 g Oral TID WC   pantoprazole   40 mg Oral Daily   rosuvastatin   5 mg Oral Daily   senna-docusate  1 tablet Oral QHS   sodium chloride  flush  3 mL Intravenous Q12H   Continuous Infusions:  lactated ringers      sodium PHOSPHATE  IVPB (in mmol)     PRN Meds:.acetaminophen  **OR** acetaminophen , bisacodyl , hydrALAZINE , HYDROmorphone  (DILAUDID ) injection, ipratropium, ondansetron  **OR** ondansetron  (ZOFRAN ) IV, oxyCODONE , traZODone   Antibiotics  :    Anti-infectives (From admission, onward)    Start     Dose/Rate Route Frequency Ordered Stop   02/18/24 1200  doxycycline  (VIBRA -TABS) tablet 100 mg        100 mg Oral Every 12 hours 02/18/24 1105 02/20/24 0959   02/17/24 2300  vancomycin  (VANCOREADY) IVPB 500 mg/100 mL  Status:  Discontinued        500 mg 100 mL/hr over 60 Minutes Intravenous Every 12 hours 02/17/24 1152 02/18/24 1105   02/17/24 1200  ceFEPIme  (MAXIPIME ) 2 g in sodium chloride  0.9 % 100 mL IVPB  Status:  Discontinued        2 g 200 mL/hr over 30 Minutes Intravenous Every 8 hours 02/17/24 1147 02/18/24 1105   02/17/24 1145  ceFEPIme  (MAXIPIME ) 2 g in sodium chloride  0.9 % 100 mL IVPB  Status:  Discontinued  2 g 200 mL/hr over 30 Minutes Intravenous  Once 02/17/24 1144 02/17/24 1147   02/17/24 1145  metroNIDAZOLE  (FLAGYL ) IVPB 500 mg  Status:  Discontinued        500 mg 100 mL/hr over 60 Minutes Intravenous Every 12 hours 02/17/24 1144 02/18/24 1105   02/17/24 1145  vancomycin  (VANCOREADY) IVPB 1250 mg/250 mL        1,250 mg 166.7 mL/hr over 90 Minutes Intravenous  Once 02/17/24 1144 02/17/24 1535   02/17/24 1130  ceFEPIme  (MAXIPIME ) 2 g in sodium chloride  0.9 % 100 mL IVPB  Status:  Discontinued        2 g 200 mL/hr over 30 Minutes Intravenous  Once 02/17/24 1117 02/17/24 1144   02/17/24 1130  metroNIDAZOLE  (FLAGYL )  IVPB 500 mg  Status:  Discontinued        500 mg 100 mL/hr over 60 Minutes Intravenous  Once 02/17/24 1117 02/17/24 1144   02/17/24 1130  vancomycin  (VANCOCIN ) IVPB 1000 mg/200 mL premix  Status:  Discontinued        1,000 mg 200 mL/hr over 60 Minutes Intravenous  Once 02/17/24 1117 02/17/24 1144         Objective:   Vitals:   02/18/24 0400 02/18/24 0500 02/18/24 0600 02/18/24 0805  BP: 100/75 106/61 117/84 132/85  Pulse: (!) 108 (!) 108 (!) 103 98  Resp: (!) 27 (!) 22 (!) 22 20  Temp: 98 F (36.7 C)   98.2 F (36.8 C)  TempSrc: Oral   Oral  SpO2: 95% 94% 96%   Weight:      Height:        Wt Readings from Last 3 Encounters:  02/17/24 54.7 kg  11/09/21 60.8 kg  11/02/21 60.8 kg     Intake/Output Summary (Last 24 hours) at 02/18/2024 1116 Last data filed at 02/18/2024 0636 Gross per 24 hour  Intake 1821.59 ml  Output 1150 ml  Net 671.59 ml     Physical Exam  Awake Alert, No new F.N deficits, Normal affect [place].AT,PERRAL Supple Neck, No JVD,   Symmetrical Chest wall movement, Good air movement bilaterally, CTAB RRR,No Gallops,Rubs or new Murmurs,  +ve B.Sounds, Abd Soft, No tenderness,   No Cyanosis, Clubbing or edema     Data Review:    Recent Labs  Lab 02/17/24 0740 02/17/24 0803 02/18/24 0327  WBC 3.7*  --  4.3  HGB 14.1 14.6 12.2  HCT 41.6 43.0 35.4*  PLT 568*  --  503*  MCV 96.7  --  94.4  MCH 32.8  --  32.5  MCHC 33.9  --  34.5  RDW 13.4  --  13.2  LYMPHSABS 1.8  --   --   MONOABS 0.3  --   --   EOSABS 0.1  --   --   BASOSABS 0.0  --   --     Recent Labs  Lab 02/17/24 0740 02/17/24 0803 02/17/24 0804 02/17/24 0931 02/17/24 1143 02/17/24 1221 02/17/24 1541 02/17/24 1634 02/18/24 0327 02/18/24 0754  NA 131* 133*  --   --   --   --   --   --  134*  --   K 4.6 4.4  --   --   --   --   --   --  4.0  --   CL 97*  --   --   --   --   --   --   --  99  --   CO2  19*  --   --   --   --   --   --   --  23  --   ANIONGAP 15  --   --   --    --   --   --   --  12  --   GLUCOSE 303*  --   --   --   --   --   --   --  95  --   BUN 11  --   --   --   --   --   --   --  8  --   CREATININE 0.92  --   --   --   --   --   --   --  0.70  --   AST 28  --   --   --   --   --   --   --  33  --   ALT 34  --   --   --   --   --   --   --  28  --   ALKPHOS 67  --   --   --   --   --   --   --  56  --   BILITOT 0.8  --   --   --   --   --   --   --  0.6  --   ALBUMIN 4.5  --   --   --   --   --   --   --  4.0  --   CRP  --   --   --   --  <0.5  --   --   --   --  <0.5  PROCALCITON  --   --   --   --   --   --   --   --   --  <0.10  LATICACIDVEN  --   --  2.0* 2.2*  --  2.7* 2.8* 2.4*  --   --   INR 1.0  --   --   --  1.1  --   --   --   --   --   HGBA1C 9.9*  --   --   --   --   --   --   --   --   --   MG  --   --   --   --  2.4  --   --   --   --  2.0  PHOS  --   --   --   --  1.7*  --   --   --   --   --   CALCIUM  9.5  --   --   --   --   --   --   --  9.3  --       Recent Labs  Lab 02/17/24 0740 02/17/24 0804 02/17/24 0931 02/17/24 1143 02/17/24 1221 02/17/24 1541 02/17/24 1634 02/18/24 0327 02/18/24 0754  CRP  --   --   --  <0.5  --   --   --   --  <0.5  PROCALCITON  --   --   --   --   --   --   --   --  <0.10  LATICACIDVEN  --  2.0* 2.2*  --  2.7* 2.8* 2.4*  --   --  INR 1.0  --   --  1.1  --   --   --   --   --   HGBA1C 9.9*  --   --   --   --   --   --   --   --   MG  --   --   --  2.4  --   --   --   --  2.0  CALCIUM  9.5  --   --   --   --   --   --  9.3  --     --------------------------------------------------------------------------------------------------------------- No results found for: CHOL, HDL, LDLCALC, LDLDIRECT, TRIG, CHOLHDL  Lab Results  Component Value Date   HGBA1C 9.9 (H) 02/17/2024   No results for input(s): TSH, T4TOTAL, FREET4, T3FREE, THYROIDAB in the last 72 hours. No results for input(s): VITAMINB12, FOLATE, FERRITIN, TIBC, IRON, RETICCTPCT in the last  72 hours. ------------------------------------------------------------------------------------------------------------------ Cardiac Enzymes No results for input(s): CKMB, TROPONINI, MYOGLOBIN in the last 168 hours.  Invalid input(s): CK  Micro Results Recent Results (from the past 240 hours)  Culture, blood (Routine x 2)     Status: None   Collection Time: 02/10/24 12:33 PM   Specimen: Right Antecubital; Blood  Result Value Ref Range Status   Specimen Description   Final    RIGHT ANTECUBITAL BOTTLES DRAWN AEROBIC AND ANAEROBIC Performed at [facility], [address], [place], [place] [address]    Special Requests   Final    Blood Culture adequate volume Performed at [facility], [address] [place], [place] [address]    Culture   Final    NO GROWTH 5 DAYS Performed at [facility], [address], [place], [place] [address]    Report Status 02/15/2024 FINAL  Final  Culture, blood (Routine x 2)     Status: None (Preliminary result)   Collection Time: 02/10/24  1:00 PM   Specimen: Site Not Specified; Blood  Result Value Ref Range Status   Specimen Description SITE NOT SPECIFIED BLOOD  Final   Special Requests   Final    Blood Culture adequate volume BOTTLES DRAWN AEROBIC AND ANAEROBIC   Culture  Setup Time   Final    GRAM POSITIVE RODS AEROBIC BOTTLE ONLY CRITICAL RESULT CALLED TO, READ BACK BY AND VERIFIED WITH: RN Pattie D on [phone] @1220  by SM    Culture   Final    GRAM POSITIVE RODS ISOLATE REFERRED FOR ID ONLY Performed at [facility], [address], [place], [place] [address]    Report Status PENDING  Incomplete  Resp panel by RT-PCR (RSV, Flu A&B, Covid) Anterior Nasal Swab     Status: None   Collection Time: 02/10/24  2:01 PM   Specimen: Anterior Nasal Swab  Result Value Ref Range Status   SARS Coronavirus 2 by RT PCR NEGATIVE NEGATIVE Final    Comment: (NOTE) SARS-CoV-2 target nucleic acids  are NOT DETECTED.  The SARS-CoV-2 RNA is generally detectable in upper respiratory specimens during the acute phase of infection. The lowest concentration of SARS-CoV-2 viral copies this assay can detect is 138 copies/mL. A negative result does not preclude SARS-Cov-2 infection and should not be used as the sole basis for treatment or other patient management decisions. A negative result may occur with  improper specimen collection/handling, submission of specimen other than nasopharyngeal swab, presence of viral mutation(s) within the areas targeted by this assay, and inadequate  number of viral copies(<138 copies/mL). A negative result must be combined with clinical observations, patient history, and epidemiological information. The expected result is Negative.  Fact Sheet for Patients:  bloggercourse.com  Fact Sheet for Healthcare Providers:  seriousbroker.it  This test is no t yet approved or cleared by the [place]  FDA and  has been authorized for detection and/or diagnosis of SARS-CoV-2 by FDA under an Emergency Use Authorization (EUA). This EUA will remain  in effect (meaning this test can be used) for the duration of the COVID-19 declaration under Section 564(b)(1) of the Act, 21 U.S.C.section 360bbb-3(b)(1), unless the authorization is terminated  or revoked sooner.       Influenza A by PCR NEGATIVE NEGATIVE Final   Influenza B by PCR NEGATIVE NEGATIVE Final    Comment: (NOTE) The Xpert Xpress SARS-CoV-2/FLU/RSV plus assay is intended as an aid in the diagnosis of influenza from Nasopharyngeal swab specimens and should not be used as a sole basis for treatment. Nasal washings and aspirates are unacceptable for Xpert Xpress SARS-CoV-2/FLU/RSV testing.  Fact Sheet for Patients: bloggercourse.com  Fact Sheet for Healthcare Providers: seriousbroker.it  This test is  not yet approved or cleared by the [place]  FDA and has been authorized for detection and/or diagnosis of SARS-CoV-2 by FDA under an Emergency Use Authorization (EUA). This EUA will remain in effect (meaning this test can be used) for the duration of the COVID-19 declaration under Section 564(b)(1) of the Act, 21 U.S.C. section 360bbb-3(b)(1), unless the authorization is terminated or revoked.     Resp Syncytial Virus by PCR NEGATIVE NEGATIVE Final    Comment: (NOTE) Fact Sheet for Patients: bloggercourse.com  Fact Sheet for Healthcare Providers: seriousbroker.it  This test is not yet approved or cleared by the [place]  FDA and has been authorized for detection and/or diagnosis of SARS-CoV-2 by FDA under an Emergency Use Authorization (EUA). This EUA will remain in effect (meaning this test can be used) for the duration of the COVID-19 declaration under Section 564(b)(1) of the Act, 21 U.S.C. section 360bbb-3(b)(1), unless the authorization is terminated or revoked.  Performed at [facility], [address], [place], [place] [address]   Blood Culture (routine x 2)     Status: None (Preliminary result)   Collection Time: 02/17/24  7:40 AM   Specimen: BLOOD  Result Value Ref Range Status   Specimen Description BLOOD RIGHT ANTECUBITAL  Final   Special Requests   Final    BOTTLES DRAWN AEROBIC AND ANAEROBIC Blood Culture results may not be optimal due to an inadequate volume of blood received in culture bottles   Culture   Final    NO GROWTH 1 DAY Performed at [facility], [address], [place], [place] [address]    Report Status PENDING  Incomplete  Resp panel by RT-PCR (RSV, Flu A&B, Covid) Anterior Nasal Swab     Status: None   Collection Time: 02/17/24  7:41 AM   Specimen: Anterior Nasal Swab  Result Value Ref Range Status   SARS Coronavirus 2 by RT PCR NEGATIVE NEGATIVE Final    Influenza A by PCR NEGATIVE NEGATIVE Final   Influenza B by PCR NEGATIVE NEGATIVE Final    Comment: (NOTE) The Xpert Xpress SARS-CoV-2/FLU/RSV plus assay is intended as an aid in the diagnosis of influenza from Nasopharyngeal swab specimens and should not be used as a sole basis for treatment. Nasal washings and aspirates are unacceptable for Xpert Xpress SARS-CoV-2/FLU/RSV testing.  Fact Sheet  for Patients: bloggercourse.com  Fact Sheet for Healthcare Providers: seriousbroker.it  This test is not yet approved or cleared by the [place]  FDA and has been authorized for detection and/or diagnosis of SARS-CoV-2 by FDA under an Emergency Use Authorization (EUA). This EUA will remain in effect (meaning this test can be used) for the duration of the COVID-19 declaration under Section 564(b)(1) of the Act, 21 U.S.C. section 360bbb-3(b)(1), unless the authorization is terminated or revoked.     Resp Syncytial Virus by PCR NEGATIVE NEGATIVE Final    Comment: (NOTE) Fact Sheet for Patients: bloggercourse.com  Fact Sheet for Healthcare Providers: seriousbroker.it  This test is not yet approved or cleared by the [place]  FDA and has been authorized for detection and/or diagnosis of SARS-CoV-2 by FDA under an Emergency Use Authorization (EUA). This EUA will remain in effect (meaning this test can be used) for the duration of the COVID-19 declaration under Section 564(b)(1) of the Act, 21 U.S.C. section 360bbb-3(b)(1), unless the authorization is terminated or revoked.  Performed at [facility], [address] [place], [place] [address]   Blood Culture (routine x 2)     Status: None (Preliminary result)   Collection Time: 02/17/24  7:45 AM   Specimen: BLOOD  Result Value Ref Range Status   Specimen Description BLOOD SITE NOT SPECIFIED  Final   Special Requests   Final     BOTTLES DRAWN AEROBIC AND ANAEROBIC Blood Culture results may not be optimal due to an inadequate volume of blood received in culture bottles   Culture   Final    NO GROWTH 1 DAY Performed at [facility], [address], [place], [place] [address]    Report Status PENDING  Incomplete    Radiology Report DG Chest Port 1 View Result Date: 02/18/2024 EXAM: 1 VIEW XRAY OF THE CHEST 02/18/2024 06:45:57 AM COMPARISON: 02/17/2024 CLINICAL HISTORY: SOB (shortness of breath) FINDINGS: LUNGS AND PLEURA: No focal pulmonary opacity. No pleural effusion. No pneumothorax. HEART AND MEDIASTINUM: No acute abnormality of the cardiac and mediastinal silhouettes. BONES AND SOFT TISSUES: No acute osseous abnormality. IMPRESSION: 1. No acute process. Electronically signed by: Waddell Calk MD MD 02/18/2024 06:58 AM EST RP Workstation: [id number]   CT Head Wo Contrast Result Date: 02/17/2024 EXAM: CT HEAD WITHOUT CONTRAST 02/17/2024 08:45:27 AM TECHNIQUE: CT of the head was performed without the administration of intravenous contrast. Automated exposure control, iterative reconstruction, and/or weight based adjustment of the mA/kV was utilized to reduce the radiation dose to as low as reasonably achievable. COMPARISON: Head CT 11/01/2018. CLINICAL HISTORY: 54 year old female with new onset headache, weakness, and body aches. FINDINGS: Streak artifact related to hair items along the posterior convexity. BRAIN AND VENTRICLES: No acute hemorrhage. No evidence of acute infarct. No hydrocephalus. No extra-axial collection. No mass effect or midline shift. Brain volume remains within normal limits for age. When allowing for streak artifact, gray-white differentiation remains within normal limits. Faint vascular calcification at the skull base. No suspicious intracranial vascular hyperdensity. ORBITS: No acute abnormality. SINUSES: Visible paranasal sinuses are clear. Tympanic cavities and mastoids are clear. SOFT  TISSUES AND SKULL: No acute soft tissue abnormality. No skull fracture. IMPRESSION: 1. Normal for age non-contrast head CT when allowing for streak artifact. Electronically signed by: Helayne Hurst MD MD 02/17/2024 08:51 AM EST RP Workstation: [id number]   DG Chest Port 1 View Result Date: 02/17/2024 EXAM: 1 VIEW XRAY OF THE CHEST 02/17/2024 08:11:17 AM COMPARISON: 02/10/2024 CLINICAL HISTORY: 54 year old female.  Questionable sepsis - evaluate for abnormality. FINDINGS: LUNGS AND PLEURA: No focal pulmonary opacity. No pleural effusion. No pneumothorax. HEART AND MEDIASTINUM: No acute abnormality of the cardiac and mediastinal silhouettes. BONES AND SOFT TISSUES: No acute osseous abnormality. IMPRESSION: 1. No acute cardiopulmonary abnormality. Electronically signed by: Helayne Hurst MD MD 02/17/2024 08:24 AM EST RP Workstation: [id number]     Signature  -   Lavada Stank M.D on 02/18/2024 at 11:16 AM   -  To page go to [email or web address]   "

## 2024-02-18 NOTE — Inpatient Diabetes Management (Signed)
 Inpatient Diabetes Program Recommendations  AACE/ADA: New Consensus Statement on Inpatient Glycemic Control   Target Ranges:  Prepandial:   less than 140 mg/dL      Peak postprandial:   less than 180 mg/dL (1-2 hours)      Critically ill patients:  140 - 180 mg/dL   Lab Results  Component Value Date   GLUCAP 135 (H) 02/18/2024   HGBA1C 9.9 (H) 02/17/2024    Latest Reference Range & Units 02/17/24 15:58 02/17/24 20:15 02/18/24 08:07 02/18/24 09:46 02/18/24 12:04  Glucose-Capillary 70 - 99 mg/dL 745 (H) 765 (H) 853 (H) 181 (H) 135 (H)   Review of Glycemic Control  Diabetes history: DM2  Outpatient Diabetes medications:  Dexcom CGM  Humalog  0-20 units TID  Lantus  20 units daily   Current orders for Inpatient glycemic control:  Lantus  30 units at bedtime  Novolog  0-20 units TID + 0-5 units at bedtime   Inpatient Diabetes Program Recommendations:   Noted diabetes coordinator consult.    Went to speak with patient at bedside, patient currently gone to CT.   Will continue to follow.   Thanks,  Lavanda Search, RN, MSN, [facility]  Inpatient Diabetes Coordinator  Pager [phone] (8a-5p)

## 2024-02-18 NOTE — Evaluation (Signed)
 Physical Therapy Evaluation Patient Details Name: Debbie Dorsey MRN: [id number] DOB: December 14, 1970 Today's Date: 02/18/2024  History of Present Illness  54 y.o. female admitted 02/17/24 with 1-wk h/o generalized malaise, chills, weakness. Workup for acute encephalopathy, sepsis of unknown etiology. PMH includes paranoid schizophrenia, DM, asthma, PE.  Clinical Impression  Pt presents with an overall decrease in functional mobility secondary to above. PTA, pt independent ambulating, lives with grandfather. Today, pt able to transfer and ambulate in hallway with CGA-supervision for safety/lines; pt pleasant and cooperative, seems confused at times (pt's friend Mose reports cognition not back to baseline). Pt would benefit from continued acute PT services to maximize functional mobility and independence prior to d/c home.     HR 130s with mobility    If plan is discharge home, recommend the following: Assistance with cooking/housework;Direct supervision/assist for medications management;Direct supervision/assist for financial management;Assist for transportation   Can travel by private vehicle   Yes     Equipment Recommendations None recommended by PT  Recommendations for Other Services       Functional Status Assessment Patient has had a recent decline in their functional status and demonstrates the ability to make significant improvements in function in a reasonable and predictable amount of time.     Precautions / Restrictions Precautions Precautions: Fall Recall of Precautions/Restrictions: Impaired Restrictions Weight Bearing Restrictions Per Provider Order: No      Mobility  Bed Mobility Overal bed mobility: Modified Independent             General bed mobility comments: HOB elevated    Transfers Overall transfer level: Needs assistance Equipment used: None Transfers: Sit to/from Stand Sit to Stand: Supervision           General transfer comment: sit<>stand  from bed and low toilet height, supervision for safety/lines    Ambulation/Gait Ambulation/Gait assistance: Contact guard assist, Supervision Gait Distance (Feet): 400 Feet Assistive device: None Gait Pattern/deviations: Step-through pattern, Decreased stride length Gait velocity: Decreased     General Gait Details: slow, mostly steady gait without DME, initial CGA for balance/safety progressing to supervision level; pt reports feeling off balance but happy to walk  Stairs            Wheelchair Mobility     Tilt Bed    Modified Rankin (Stroke Patients Only)       Balance Overall balance assessment: Needs assistance Sitting-balance support: No upper extremity supported, Feet supported Sitting balance-Leahy Scale: Good Sitting balance - Comments: indep with pericare/toileting   Standing balance support: No upper extremity supported, During functional activity Standing balance-Leahy Scale: Good Standing balance comment: performing ADL tasks at sink without UE support                             Pertinent Vitals/Pain Pain Assessment Pain Assessment: No/denies pain    Home Living Family/patient expects to be discharged to:: Private residence Living Arrangements: Other relatives Available Help at Discharge: Family;Available 24 hours/day Type of Home: House Home Access: Stairs to enter       Home Layout: One level   Additional Comments: lives with grandfater who walks with [facility]. pt seems confused by some home set up questions and states, I don't remember    Prior Function Prior Level of Function : Independent/Modified Independent;Needs assist  Cognitive Assist : ADLs (cognitive)           Mobility Comments: pt reports indep mobility, does not drive; she  enjoys watching tv, especially wrestling ADLs Comments: reports indep; suspect family assists with at least iADLs due to cognition     Extremity/Trunk Assessment   Upper Extremity  Assessment Upper Extremity Assessment: Overall WFL for tasks assessed    Lower Extremity Assessment Lower Extremity Assessment: Overall WFL for tasks assessed    Cervical / Trunk Assessment Cervical / Trunk Assessment: Normal  Communication   Communication Communication: No apparent difficulties    Cognition Arousal: Alert Behavior During Therapy: Flat affect   PT - Cognitive impairments: No family/caregiver present to determine baseline                       PT - Cognition Comments: h/o paranoid schizophrenia. pt pleasant and cooperative, following simple commands sometimes with increased cues. poor historian and does not recall all of home set up. pushing on automatic soap dispenser multiple times trying to get soap out Following commands: Intact       Cueing Cueing Techniques: Verbal cues     General Comments General comments (skin integrity, edema, etc.): HR up to 130s with activity. pt's friend Zebedee) present and encouraging to pt.    Exercises     Assessment/Plan    PT Assessment Patient needs continued PT services  PT Problem List Decreased activity tolerance;Decreased balance;Decreased mobility       PT Treatment Interventions Gait training;Stair training;Functional mobility training;Therapeutic activities;Therapeutic exercise;Balance training;Patient/family education    PT Goals (Current goals can be found in the Care Plan section)  Acute Rehab PT Goals Patient Stated Goal: feel better PT Goal Formulation: With patient Time For Goal Achievement: 03/03/24 Potential to Achieve Goals: Good    Frequency Min 1X/week     Co-evaluation               AM-PAC PT 6 Clicks Mobility  Outcome Measure Help needed turning from your back to your side while in a flat bed without using bedrails?: None Help needed moving from lying on your back to sitting on the side of a flat bed without using bedrails?: None Help needed moving to and from a bed to a  chair (including a wheelchair)?: A Little Help needed standing up from a chair using your arms (e.g., wheelchair or bedside chair)?: A Little Help needed to walk in hospital room?: A Little Help needed climbing 3-5 steps with a railing? : A Little 6 Click Score: 20    End of Session Equipment Utilized During Treatment: Gait belt Activity Tolerance: Patient tolerated treatment well Patient left: in bed;with call bell/phone within reach;with bed alarm set Nurse Communication: Mobility status PT Visit Diagnosis: Other abnormalities of gait and mobility (R26.89)    Time: 9189-9169 PT Time Calculation (min) (ACUTE ONLY): 20 min   Charges:   PT Evaluation $PT Eval Moderate Complexity: 1 Mod   PT General Charges $$ ACUTE PT VISIT: 1 Visit       Debbie Dorsey, PT, DPT Acute Rehabilitation Services  Personal: Secure Chat Rehab Office: [phone]  Debbie Dorsey 02/18/2024, 10:16 AM

## 2024-02-19 DIAGNOSIS — R6520 Severe sepsis without septic shock: Secondary | ICD-10-CM | POA: Diagnosis not present

## 2024-02-19 DIAGNOSIS — J9600 Acute respiratory failure, unspecified whether with hypoxia or hypercapnia: Secondary | ICD-10-CM | POA: Diagnosis not present

## 2024-02-19 DIAGNOSIS — A419 Sepsis, unspecified organism: Secondary | ICD-10-CM | POA: Diagnosis not present

## 2024-02-19 LAB — GLUCOSE, CAPILLARY
Glucose-Capillary: 185 mg/dL — ABNORMAL HIGH (ref 70–99)
Glucose-Capillary: 190 mg/dL — ABNORMAL HIGH (ref 70–99)
Glucose-Capillary: 181 mg/dL — ABNORMAL HIGH (ref 70–99)
Glucose-Capillary: 126 mg/dL — ABNORMAL HIGH (ref 70–99)
Glucose-Capillary: 112 mg/dL — ABNORMAL HIGH (ref 70–99)

## 2024-02-19 LAB — CBC WITH DIFFERENTIAL/PLATELET
Abs Immature Granulocytes: 0.01 K/uL (ref 0.00–0.07)
Basophils Absolute: 0 K/uL (ref 0.0–0.1)
Basophils Relative: 1 %
Eosinophils Absolute: 0.1 K/uL (ref 0.0–0.5)
Eosinophils Relative: 2 %
HCT: 32 % — ABNORMAL LOW (ref 36.0–46.0)
Hemoglobin: 10.9 g/dL — ABNORMAL LOW (ref 12.0–15.0)
Immature Granulocytes: 0 %
Lymphocytes Relative: 54 %
Lymphs Abs: 2.4 K/uL (ref 0.7–4.0)
MCH: 32.7 pg (ref 26.0–34.0)
MCHC: 34.1 g/dL (ref 30.0–36.0)
MCV: 96.1 fL (ref 80.0–100.0)
Monocytes Absolute: 0.4 K/uL (ref 0.1–1.0)
Monocytes Relative: 8 %
Neutro Abs: 1.5 K/uL — ABNORMAL LOW (ref 1.7–7.7)
Neutrophils Relative %: 35 %
Platelets: 424 K/uL — ABNORMAL HIGH (ref 150–400)
RBC: 3.33 MIL/uL — ABNORMAL LOW (ref 3.87–5.11)
RDW: 13.3 % (ref 11.5–15.5)
WBC: 4.3 K/uL (ref 4.0–10.5)
nRBC: 0 % (ref 0.0–0.2)

## 2024-02-19 LAB — PHOSPHORUS: Phosphorus: 3.4 mg/dL (ref 2.5–4.6)

## 2024-02-19 LAB — BASIC METABOLIC PANEL WITH GFR
Anion gap: 10 (ref 5–15)
BUN: 8 mg/dL (ref 6–20)
CO2: 25 mmol/L (ref 22–32)
Calcium: 8.3 mg/dL — ABNORMAL LOW (ref 8.9–10.3)
Chloride: 99 mmol/L (ref 98–111)
Creatinine, Ser: 0.71 mg/dL (ref 0.44–1.00)
GFR, Estimated: >60 mL/min
Glucose, Bld: 201 mg/dL — ABNORMAL HIGH (ref 70–99)
Potassium: 3.5 mmol/L (ref 3.5–5.1)
Sodium: 134 mmol/L — ABNORMAL LOW (ref 135–145)

## 2024-02-19 LAB — MAGNESIUM: Magnesium: 2.1 mg/dL (ref 1.7–2.4)

## 2024-02-19 MED ORDER — POTASSIUM CHLORIDE CRYS ER 20 MEQ PO TBCR
40.0000 meq | EXTENDED_RELEASE_TABLET | Freq: Once | ORAL | Status: AC
  Administered 2024-02-19: 40 meq via ORAL
  Filled 2024-02-19: qty 2

## 2024-02-19 MED ORDER — TAMSULOSIN HCL 0.4 MG PO CAPS
0.4000 mg | ORAL_CAPSULE | Freq: Every day | ORAL | Status: DC
  Administered 2024-02-19 – 2024-02-21 (×3): 0.4 mg via ORAL
  Filled 2024-02-19 (×3): qty 1

## 2024-02-19 NOTE — Plan of Care (Signed)

## 2024-02-19 NOTE — Inpatient Diabetes Management (Signed)
 Inpatient Diabetes Program Recommendations  AACE/ADA: New Consensus Statement on Inpatient Glycemic Control (2015)  Target Ranges:  Prepandial:   less than 140 mg/dL      Peak postprandial:   less than 180 mg/dL (1-2 hours)      Critically ill patients:  140 - 180 mg/dL   Lab Results  Component Value Date   GLUCAP 190 (H) 02/19/2024   HGBA1C 9.9 (H) 02/17/2024    Latest Reference Range & Units 02/18/24 12:04 02/18/24 16:39 02/18/24 21:07 02/19/24 07:44 02/19/24 10:17  Glucose-Capillary 70 - 99 mg/dL 864 (H) 844 (H) 827 (H) 185 (H) 190 (H)    Review of Glycemic Control  Diabetes history: DM2  Outpatient Diabetes medications:  Dexcom CGM Humalog  0-20 units TID Lantus  20 units daily   Current orders for Inpatient glycemic control:  Lantus  30 units at bedtime  Novolog  0-20 units TID + 0-5 units at bedtime   Inpatient Diabetes Program Recommendations:   Spoke with patient and friend at bedside. Patient seemed confused at times throughout conversation. Patient reports being followed by Dr Janey for diabetes management and currently taking Dexcom CGM, Humalog  0-20 units TID, Lantus  20 units daily outpatient for diabetes control. Patient reports taking DM medications as prescribed and using Dexcom CGM 2 times per day to check CGM. Inquired about prior A1C and patient reports not being able to recall last A1C value. Discussed A1C results 9.9% and explained that current A1C indicates an average glucose of 237 mg/dl over the past 2-3 months. Discussed glucose and A1C goals. Discussed importance of checking CBGs and maintaining good CBG control to prevent long-term and short-term complications. Stressed to the patient the importance of improving glycemic control to prevent further complications from uncontrolled diabetes. Discussed impact of nutrition, stress, sickness, and medications on diabetes control.  Discussed carbohydrates, carbohydrate goals per day and meal, along with portion sizes.  Patient denies drinking  regular or sugary drinks. States she eats backed chicken often for protein - she does not eat red meat. Encouraged patient to check glucose 4 times per day (before meals and at bedtime). Denies any issues obtaining medications and supplies for diabetes control. Patient verbalized understanding of information discussed and reports no further questions at this time related to diabetes.  Thanks,  Lavanda Search, RN, MSN, [facility]  Inpatient Diabetes Coordinator  Pager [phone] (8a-5p)

## 2024-02-19 NOTE — TOC Progression Note (Addendum)
 Transition of Care [facility]) - Progression Note    Patient Details  Name: Debbie Dorsey MRN: [id number] Date of Birth: 17-Nov-1970  [facility] CM/SW Contact  Landry DELENA Senters, RN Phone Number: 02/19/2024, 1:25 PM  Clinical Narrative:     Patient had Foley placed yesterday. Grandfather is aware of this.   HHRN arranged through [facility], info added to AVS.   CM will continue to follow.  Expected Discharge Plan:  (TBD) Barriers to Discharge: Continued Medical Work up               Expected Discharge Plan and Services       Living arrangements for the past 2 months: Single Family Home                                       Social Drivers of Health (SDOH) Interventions SDOH Screenings   Food Insecurity: No Food Insecurity (02/17/2024)  Housing: Low Risk (02/17/2024)  Transportation Needs: No Transportation Needs (02/17/2024)  Utilities: Not At Risk (02/17/2024)  Social Connections: Socially Isolated (02/17/2024)  Tobacco Use: Low Risk (02/17/2024)    Readmission Risk Interventions     No data to display

## 2024-02-19 NOTE — Progress Notes (Addendum)
 "                                                                                                                                                                                                                                                                                PROGRESS NOTE     Patient Demographics:    Debbie Dorsey, is a 54 y.o. female, DOB - December 07, 1970, FMW:[id number]  Outpatient Primary MD for the patient is Avva, Ravisankar, MD    LOS - 1  Admit date - 02/17/2024    Chief Complaint  Patient presents with   Weakness   Generalized Body Aches       Brief Narrative (HPI from H&P)    53 history of schizophrenia, DM2, HLD, GERD, and peripheral neuropathy, who presents today with a one-week history of generalized malaise, chills, and progressive weakness.  Earlier this week, the patient sought care at [facility] for similar symptoms. According to the patient and her grandfather, no definitive etiology was identified at that time; however, she was discharged on a course of oral antibiotics. Despite adherence to the medication, she reports no clinical improvement and notes that her weakness has become progressively worse.  This morning, the patient was found with significant shivering/rigors. She is currently a limited historian due to her baseline mental health status, but she denies dysuria, polyuria, or increased urinary frequency. She further denies chest pain, shortness of breath, headache, or visual changes. There are no reported focal neurological deficits, such as asymmetric weakness, and the patient remains at her baseline mental status without new confusion now.     Subjective:   Patient in bed, appears comfortable, denies any headache, no fever, no chest pain or pressure, no shortness of breath , no abdominal pain. No focal weakness.   Assessment  & Plan :   Sepsis (HCC) of unclear origin, twice in a few weeks, acute metabolic encephalopathy due to combination of  sepsis and nonketotic hyperosmolar state.  No clear source of infection, stable inflammatory markers, initial viral panel negative, of note she was admitted to [facility] for sepsis of unclear origin few weeks ago, currently on empiric antibiotics sepsis pathophysiology has resolved,  mentation back to normal, no headache or focal deficits, head CT unremarkable.  For now we will we will de-escalate antibiotics, CT chest abdomen pelvis negative viral panel suggests patient has coronavirus URI infection.  Paranoid schizophrenia (HCC)  Mentation and mood stable, no acute issues, on Neurontin  only for now which will be continued.   GERD (gastroesophageal reflux disease)  Continue PPI   Hyperlipidemia Continue rosuvastatin   Urinary retention.  According to the patient she has been having issues for several weeks, had over 600 cc postvoid residual, Foley and Flomax  initiated, outpatient urology follow-up, likely will be discharged with Foley.    Nonketotic hyperosmolar state in a patient with type 2 diabetes mellitus (HCC) poor outpatient control due to high A1c, diabetic education.  Insulin  registered on 02/18/2024 for better control.     Lab Results  Component Value Date   HGBA1C 9.9 (H) 02/17/2024   CBG (last 3)  Recent Labs    02/18/24 2107 02/19/24 0744 02/19/24 1017  GLUCAP 172* 185* 190*        Condition - Fair  Family Communication  : Family member sleeping bedside on 02/18/2024, updated [name] patient's grandfather [phone] was updated 02/18/2024, 02/19/24  Code Status :  Full  Consults  :  DM educator  PUD Prophylaxis :  PPI   Procedures  :     CT head.  Nonacute      Disposition Plan  :    Status is: Inpatient   DVT Prophylaxis  :    heparin  injection 5,000 Units Start: 02/17/24 1400 SCDs Start: 02/17/24 1142    Lab Results  Component Value Date   PLT 424 (H) 02/19/2024    Diet :  Diet Order             Diet Carb Modified Room service  appropriate? Yes  Diet effective now                    Inpatient Medications  Scheduled Meds:  Chlorhexidine  Gluconate Cloth  6 each Topical Daily   doxycycline   100 mg Oral Q12H   gabapentin   300 mg Oral TID   heparin   5,000 Units Subcutaneous Q8H   insulin  aspart  0-20 Units Subcutaneous TID WC   insulin  aspart  0-5 Units Subcutaneous QHS   insulin  glargine  30 Units Subcutaneous QHS   lactobacillus  1 g Oral TID WC   pantoprazole   40 mg Oral Daily   polyethylene glycol  17 g Oral BID   rosuvastatin   5 mg Oral Daily   senna-docusate  1 tablet Oral QHS   sodium chloride  flush  3 mL Intravenous Q12H   tamsulosin   0.4 mg Oral Daily   Continuous Infusions:   PRN Meds:.acetaminophen  **OR** acetaminophen , bisacodyl , hydrALAZINE , ipratropium, ondansetron  **OR** ondansetron  (ZOFRAN ) IV, traZODone   Antibiotics  :    Anti-infectives (From admission, onward)    Start     Dose/Rate Route Frequency Ordered Stop   02/18/24 1200  doxycycline  (VIBRA -TABS) tablet 100 mg        100 mg Oral Every 12 hours 02/18/24 1105 02/20/24 0959   02/17/24 2300  vancomycin  (VANCOREADY) IVPB 500 mg/100 mL  Status:  Discontinued        500 mg 100 mL/hr over 60 Minutes Intravenous Every 12 hours 02/17/24 1152 02/18/24 1105   02/17/24 1200  ceFEPIme  (MAXIPIME ) 2 g in sodium chloride  0.9 % 100 mL IVPB  Status:  Discontinued        2 g  200 mL/hr over 30 Minutes Intravenous Every 8 hours 02/17/24 1147 02/18/24 1105   02/17/24 1145  ceFEPIme  (MAXIPIME ) 2 g in sodium chloride  0.9 % 100 mL IVPB  Status:  Discontinued        2 g 200 mL/hr over 30 Minutes Intravenous  Once 02/17/24 1144 02/17/24 1147   02/17/24 1145  metroNIDAZOLE  (FLAGYL ) IVPB 500 mg  Status:  Discontinued        500 mg 100 mL/hr over 60 Minutes Intravenous Every 12 hours 02/17/24 1144 02/18/24 1105   02/17/24 1145  vancomycin  (VANCOREADY) IVPB 1250 mg/250 mL        1,250 mg 166.7 mL/hr over 90 Minutes Intravenous  Once 02/17/24 1144  02/17/24 1535   02/17/24 1130  ceFEPIme  (MAXIPIME ) 2 g in sodium chloride  0.9 % 100 mL IVPB  Status:  Discontinued        2 g 200 mL/hr over 30 Minutes Intravenous  Once 02/17/24 1117 02/17/24 1144   02/17/24 1130  metroNIDAZOLE  (FLAGYL ) IVPB 500 mg  Status:  Discontinued        500 mg 100 mL/hr over 60 Minutes Intravenous  Once 02/17/24 1117 02/17/24 1144   02/17/24 1130  vancomycin  (VANCOCIN ) IVPB 1000 mg/200 mL premix  Status:  Discontinued        1,000 mg 200 mL/hr over 60 Minutes Intravenous  Once 02/17/24 1117 02/17/24 1144         Objective:   Vitals:   02/19/24 0000 02/19/24 0100 02/19/24 0400 02/19/24 0745  BP: (!) 88/52 104/69 125/68 104/66  Pulse: (!) 108 (!) 103 98 98  Resp: (!) 25 (!) 29 17 (!) 25  Temp: 98.1 F (36.7 C)  98.7 F (37.1 C) 99 F (37.2 C)  TempSrc: Oral  Oral Oral  SpO2: 95% 97% 98% 97%  Weight:      Height:        Wt Readings from Last 3 Encounters:  02/17/24 54.7 kg  11/09/21 60.8 kg  11/02/21 60.8 kg     Intake/Output Summary (Last 24 hours) at 02/19/2024 1030 Last data filed at 02/19/2024 0522 Gross per 24 hour  Intake 534.02 ml  Output 1625 ml  Net -1090.98 ml     Physical Exam  Awake Alert, No new F.N deficits, Normal affect [place].AT,PERRAL Supple Neck, No JVD,   Symmetrical Chest wall movement, Good air movement bilaterally, CTAB RRR,No Gallops,Rubs or new Murmurs,  +ve B.Sounds, Abd Soft, No tenderness,   No Cyanosis, Clubbing or edema     Data Review:    Recent Labs  Lab 02/17/24 0740 02/17/24 0803 02/18/24 0327 02/19/24 0335  WBC 3.7*  --  4.3 4.3  HGB 14.1 14.6 12.2 10.9*  HCT 41.6 43.0 35.4* 32.0*  PLT 568*  --  503* 424*  MCV 96.7  --  94.4 96.1  MCH 32.8  --  32.5 32.7  MCHC 33.9  --  34.5 34.1  RDW 13.4  --  13.2 13.3  LYMPHSABS 1.8  --   --  2.4  MONOABS 0.3  --   --  0.4  EOSABS 0.1  --   --  0.1  BASOSABS 0.0  --   --  0.0    Recent Labs  Lab 02/17/24 0740 02/17/24 0803 02/17/24 0804  02/17/24 0931 02/17/24 1143 02/17/24 1221 02/17/24 1541 02/17/24 1634 02/18/24 0327 02/18/24 0754 02/19/24 0335  NA 131* 133*  --   --   --   --   --   --  134*  --  134*  K 4.6 4.4  --   --   --   --   --   --  4.0  --  3.5  CL 97*  --   --   --   --   --   --   --  99  --  99  CO2 19*  --   --   --   --   --   --   --  23  --  25  ANIONGAP 15  --   --   --   --   --   --   --  12  --  10  GLUCOSE 303*  --   --   --   --   --   --   --  95  --  201*  BUN 11  --   --   --   --   --   --   --  8  --  8  CREATININE 0.92  --   --   --   --   --   --   --  0.70  --  0.71  AST 28  --   --   --   --   --   --   --  33  --   --   ALT 34  --   --   --   --   --   --   --  28  --   --   ALKPHOS 67  --   --   --   --   --   --   --  56  --   --   BILITOT 0.8  --   --   --   --   --   --   --  0.6  --   --   ALBUMIN 4.5  --   --   --   --   --   --   --  4.0  --   --   CRP  --   --   --   --  <0.5  --   --   --   --  <0.5  --   PROCALCITON  --   --   --   --   --   --   --   --   --  <0.10  --   LATICACIDVEN  --   --  2.0* 2.2*  --  2.7* 2.8* 2.4*  --   --   --   INR 1.0  --   --   --  1.1  --   --   --   --   --   --   HGBA1C 9.9*  --   --   --   --   --   --   --   --   --   --   MG  --   --   --   --  2.4  --   --   --   --  2.0 2.1  PHOS  --   --   --   --  1.7*  --   --   --   --   --  3.4  CALCIUM  9.5  --   --   --   --   --   --   --  9.3  --  8.3*      Recent Labs  Lab 02/17/24 0740 02/17/24 0804 02/17/24 0931 02/17/24 1143 02/17/24 1221 02/17/24 1541 02/17/24 1634 02/18/24 0327 02/18/24 0754 02/19/24 0335  CRP  --   --   --  <0.5  --   --   --   --  <0.5  --   PROCALCITON  --   --   --   --   --   --   --   --  <0.10  --   LATICACIDVEN  --  2.0* 2.2*  --  2.7* 2.8* 2.4*  --   --   --   INR 1.0  --   --  1.1  --   --   --   --   --   --   HGBA1C 9.9*  --   --   --   --   --   --   --   --   --   MG  --   --   --  2.4  --   --   --   --  2.0 2.1  CALCIUM  9.5  --   --    --   --   --   --  9.3  --  8.3*    --------------------------------------------------------------------------------------------------------------- No results found for: CHOL, HDL, LDLCALC, LDLDIRECT, TRIG, CHOLHDL  Lab Results  Component Value Date   HGBA1C 9.9 (H) 02/17/2024   No results for input(s): TSH, T4TOTAL, FREET4, T3FREE, THYROIDAB in the last 72 hours. No results for input(s): VITAMINB12, FOLATE, FERRITIN, TIBC, IRON, RETICCTPCT in the last 72 hours. ------------------------------------------------------------------------------------------------------------------ Cardiac Enzymes No results for input(s): CKMB, TROPONINI, MYOGLOBIN in the last 168 hours.  Invalid input(s): CK  Micro Results Recent Results (from the past 240 hours)  Culture, blood (Routine x 2)     Status: None   Collection Time: 02/10/24 12:33 PM   Specimen: Right Antecubital; Blood  Result Value Ref Range Status   Specimen Description   Final    RIGHT ANTECUBITAL BOTTLES DRAWN AEROBIC AND ANAEROBIC Performed at [facility], [address] [place], [place] [address]    Special Requests   Final    Blood Culture adequate volume Performed at [facility], [address], [place], [place] [address]    Culture   Final    NO GROWTH 5 DAYS Performed at [facility], [address] [place], [place] [address]    Report Status 02/15/2024 FINAL  Final  Culture, blood (Routine x 2)     Status: None (Preliminary result)   Collection Time: 02/10/24  1:00 PM   Specimen: Site Not Specified; Blood  Result Value Ref Range Status   Specimen Description SITE NOT SPECIFIED BLOOD  Final   Special Requests   Final    Blood Culture adequate volume BOTTLES DRAWN AEROBIC AND ANAEROBIC   Culture  Setup Time   Final    GRAM POSITIVE RODS AEROBIC BOTTLE ONLY CRITICAL RESULT CALLED TO, READ BACK BY AND VERIFIED WITH: RN Pattie D on  [phone] @1220  by SM    Culture   Final    GRAM POSITIVE RODS CULTURE REINCUBATED FOR BETTER GROWTH ISOLATE REFERRED FOR ID ONLY Performed at [facility], [address] [place], [place] [address]    Report Status PENDING  Incomplete  Resp panel by RT-PCR (RSV, Flu A&B, Covid) Anterior Nasal Swab     Status: None   Collection Time:  02/10/24  2:01 PM   Specimen: Anterior Nasal Swab  Result Value Ref Range Status   SARS Coronavirus 2 by RT PCR NEGATIVE NEGATIVE Final    Comment: (NOTE) SARS-CoV-2 target nucleic acids are NOT DETECTED.  The SARS-CoV-2 RNA is generally detectable in upper respiratory specimens during the acute phase of infection. The lowest concentration of SARS-CoV-2 viral copies this assay can detect is 138 copies/mL. A negative result does not preclude SARS-Cov-2 infection and should not be used as the sole basis for treatment or other patient management decisions. A negative result may occur with  improper specimen collection/handling, submission of specimen other than nasopharyngeal swab, presence of viral mutation(s) within the areas targeted by this assay, and inadequate number of viral copies(<138 copies/mL). A negative result must be combined with clinical observations, patient history, and epidemiological information. The expected result is Negative.  Fact Sheet for Patients:  bloggercourse.com  Fact Sheet for Healthcare Providers:  seriousbroker.it  This test is no t yet approved or cleared by the [place]  FDA and  has been authorized for detection and/or diagnosis of SARS-CoV-2 by FDA under an Emergency Use Authorization (EUA). This EUA will remain  in effect (meaning this test can be used) for the duration of the COVID-19 declaration under Section 564(b)(1) of the Act, 21 U.S.C.section 360bbb-3(b)(1), unless the authorization is terminated  or revoked sooner.       Influenza A by PCR  NEGATIVE NEGATIVE Final   Influenza B by PCR NEGATIVE NEGATIVE Final    Comment: (NOTE) The Xpert Xpress SARS-CoV-2/FLU/RSV plus assay is intended as an aid in the diagnosis of influenza from Nasopharyngeal swab specimens and should not be used as a sole basis for treatment. Nasal washings and aspirates are unacceptable for Xpert Xpress SARS-CoV-2/FLU/RSV testing.  Fact Sheet for Patients: bloggercourse.com  Fact Sheet for Healthcare Providers: seriousbroker.it  This test is not yet approved or cleared by the [place]  FDA and has been authorized for detection and/or diagnosis of SARS-CoV-2 by FDA under an Emergency Use Authorization (EUA). This EUA will remain in effect (meaning this test can be used) for the duration of the COVID-19 declaration under Section 564(b)(1) of the Act, 21 U.S.C. section 360bbb-3(b)(1), unless the authorization is terminated or revoked.     Resp Syncytial Virus by PCR NEGATIVE NEGATIVE Final    Comment: (NOTE) Fact Sheet for Patients: bloggercourse.com  Fact Sheet for Healthcare Providers: seriousbroker.it  This test is not yet approved or cleared by the [place]  FDA and has been authorized for detection and/or diagnosis of SARS-CoV-2 by FDA under an Emergency Use Authorization (EUA). This EUA will remain in effect (meaning this test can be used) for the duration of the COVID-19 declaration under Section 564(b)(1) of the Act, 21 U.S.C. section 360bbb-3(b)(1), unless the authorization is terminated or revoked.  Performed at [facility], [address], [place], [place] [address]   Blood Culture (routine x 2)     Status: None (Preliminary result)   Collection Time: 02/17/24  7:40 AM   Specimen: BLOOD  Result Value Ref Range Status   Specimen Description BLOOD RIGHT ANTECUBITAL  Final   Special Requests   Final     BOTTLES DRAWN AEROBIC AND ANAEROBIC Blood Culture results may not be optimal due to an inadequate volume of blood received in culture bottles   Culture   Final    NO GROWTH 1 DAY Performed at [facility], [address] [place], [place] [address]  Report Status PENDING  Incomplete  Resp panel by RT-PCR (RSV, Flu A&B, Covid) Anterior Nasal Swab     Status: None   Collection Time: 02/17/24  7:41 AM   Specimen: Anterior Nasal Swab  Result Value Ref Range Status   SARS Coronavirus 2 by RT PCR NEGATIVE NEGATIVE Final   Influenza A by PCR NEGATIVE NEGATIVE Final   Influenza B by PCR NEGATIVE NEGATIVE Final    Comment: (NOTE) The Xpert Xpress SARS-CoV-2/FLU/RSV plus assay is intended as an aid in the diagnosis of influenza from Nasopharyngeal swab specimens and should not be used as a sole basis for treatment. Nasal washings and aspirates are unacceptable for Xpert Xpress SARS-CoV-2/FLU/RSV testing.  Fact Sheet for Patients: bloggercourse.com  Fact Sheet for Healthcare Providers: seriousbroker.it  This test is not yet approved or cleared by the [place]  FDA and has been authorized for detection and/or diagnosis of SARS-CoV-2 by FDA under an Emergency Use Authorization (EUA). This EUA will remain in effect (meaning this test can be used) for the duration of the COVID-19 declaration under Section 564(b)(1) of the Act, 21 U.S.C. section 360bbb-3(b)(1), unless the authorization is terminated or revoked.     Resp Syncytial Virus by PCR NEGATIVE NEGATIVE Final    Comment: (NOTE) Fact Sheet for Patients: bloggercourse.com  Fact Sheet for Healthcare Providers: seriousbroker.it  This test is not yet approved or cleared by the [place]  FDA and has been authorized for detection and/or diagnosis of SARS-CoV-2 by FDA under an Emergency Use Authorization (EUA). This EUA will  remain in effect (meaning this test can be used) for the duration of the COVID-19 declaration under Section 564(b)(1) of the Act, 21 U.S.C. section 360bbb-3(b)(1), unless the authorization is terminated or revoked.  Performed at [facility], [address], [place], [place] [address]   Blood Culture (routine x 2)     Status: None (Preliminary result)   Collection Time: 02/17/24  7:45 AM   Specimen: BLOOD  Result Value Ref Range Status   Specimen Description BLOOD SITE NOT SPECIFIED  Final   Special Requests   Final    BOTTLES DRAWN AEROBIC AND ANAEROBIC Blood Culture results may not be optimal due to an inadequate volume of blood received in culture bottles   Culture   Final    NO GROWTH 1 DAY Performed at [facility], [address] [place], [place] [address]    Report Status PENDING  Incomplete  Respiratory (~20 pathogens) panel by PCR     Status: Abnormal   Collection Time: 02/18/24 11:23 AM   Specimen: Nasopharyngeal Swab; Respiratory  Result Value Ref Range Status   Adenovirus NOT DETECTED NOT DETECTED Final   Coronavirus 229E NOT DETECTED NOT DETECTED Final    Comment: (NOTE) The Coronavirus on the Respiratory Panel, DOES NOT test for the novel  Coronavirus (2019 nCoV)    Coronavirus HKU1 DETECTED (A) NOT DETECTED Final   Coronavirus NL63 NOT DETECTED NOT DETECTED Final   Coronavirus OC43 NOT DETECTED NOT DETECTED Final   Metapneumovirus NOT DETECTED NOT DETECTED Final   Rhinovirus / Enterovirus NOT DETECTED NOT DETECTED Final   Influenza A NOT DETECTED NOT DETECTED Final   Influenza B NOT DETECTED NOT DETECTED Final   Parainfluenza Virus 1 NOT DETECTED NOT DETECTED Final   Parainfluenza Virus 2 NOT DETECTED NOT DETECTED Final   Parainfluenza Virus 3 NOT DETECTED NOT DETECTED Final   Parainfluenza Virus 4 NOT DETECTED NOT DETECTED Final   Respiratory Syncytial  Virus NOT DETECTED NOT DETECTED Final   Bordetella pertussis NOT DETECTED NOT DETECTED Final    Bordetella Parapertussis NOT DETECTED NOT DETECTED Final   Chlamydophila pneumoniae NOT DETECTED NOT DETECTED Final   Mycoplasma pneumoniae NOT DETECTED NOT DETECTED Final    Comment: Performed at [facility], [address] [place], [place] [address]    Radiology Report CT CHEST ABDOMEN PELVIS W CONTRAST Result Date: 02/18/2024 CLINICAL DATA:  Sepsis, shortness of breath EXAM: CT CHEST, ABDOMEN, AND PELVIS WITH CONTRAST TECHNIQUE: Multidetector CT imaging of the chest, abdomen and pelvis was performed following the standard protocol during bolus administration of intravenous contrast. RADIATION DOSE REDUCTION: This exam was performed according to the departmental dose-optimization program which includes automated exposure control, adjustment of the mA and/or kV according to patient size and/or use of iterative reconstruction technique. CONTRAST:  75mL OMNIPAQUE  IOHEXOL  350 MG/ML SOLN COMPARISON:  02/18/2024, 07/30/2020 FINDINGS: CT CHEST FINDINGS Cardiovascular: The heart is unremarkable without pericardial effusion. No evidence of thoracic aortic aneurysm or dissection. Mediastinum/Nodes: No enlarged mediastinal, hilar, or axillary lymph nodes. Thyroid  gland, trachea, and esophagus demonstrate no significant findings. Lungs/Pleura: No acute airspace disease, effusion, or pneumothorax. Assessment of the lung parenchyma is slightly limited by respiratory motion during the exam. Musculoskeletal: No acute or destructive bony abnormalities. Reconstructed images demonstrate no additional findings. CT ABDOMEN PELVIS FINDINGS Hepatobiliary: No focal liver abnormality is seen. No gallstones, gallbladder wall thickening, or biliary dilatation. Pancreas: Unremarkable. No pancreatic ductal dilatation or surrounding inflammatory changes. Spleen: Normal in size without focal abnormality. Adrenals/Urinary Tract: Kidneys enhance normally and symmetrically. Nonobstructing 4 mm calculus lower pole right kidney.  No obstructive uropathy within either kidney. The adrenals and bladder are unremarkable. Stomach/Bowel: No bowel obstruction or ileus. The appendix is not well visualized. No bowel wall thickening or inflammatory change. Moderate retained stool throughout the colon. Vascular/Lymphatic: Aortic atherosclerosis. No enlarged abdominal or pelvic lymph nodes. Reproductive: Heterogeneous mildly enlarged uterus consistent with underlying fibroids. No adnexal masses. Other: No free fluid or free intraperitoneal gas. No abdominal wall hernia. Musculoskeletal: No acute or destructive bony abnormalities. Reconstructed images demonstrate no additional findings. IMPRESSION: 1. No acute intrathoracic, intra-abdominal, or intrapelvic process. 2. Stable 4 mm nonobstructing right renal calculus. 3. Moderate stool throughout the colon.  No obstruction or ileus. 4. Stable fibroid uterus. 5.  Aortic Atherosclerosis (ICD10-I70.0). Electronically Signed   By: Ozell Daring M.D.   On: 02/18/2024 15:08   DG Chest Port 1 View Result Date: 02/18/2024 EXAM: 1 VIEW XRAY OF THE CHEST 02/18/2024 06:45:57 AM COMPARISON: 02/17/2024 CLINICAL HISTORY: SOB (shortness of breath) FINDINGS: LUNGS AND PLEURA: No focal pulmonary opacity. No pleural effusion. No pneumothorax. HEART AND MEDIASTINUM: No acute abnormality of the cardiac and mediastinal silhouettes. BONES AND SOFT TISSUES: No acute osseous abnormality. IMPRESSION: 1. No acute process. Electronically signed by: Waddell Calk MD MD 02/18/2024 06:58 AM EST RP Workstation: [id number]     Signature  -   Lavada Stank M.D on 02/19/2024 at 10:30 AM   -  To page go to [email or web address]   "

## 2024-02-19 NOTE — TOC Progression Note (Signed)
 Transition of Care [facility]) - Progression Note    Patient Details  Name: Debbie Dorsey MRN: [id number] Date of Birth: 1970-06-26  Transition of Care [facility] CM/SW Contact  Inocente GORMAN Kindle, LCSW Phone Number: 02/19/2024, 11:08 AM  Clinical Narrative:    CSW received call from Tawni Forbes, nurse case manager for CAP services [phone]). She emailed consent form to CSW. Patient has an aide for a few hours in the home. CSW provided update. Christina expressed concerns that grandfather would not be able to handle a foley at home. She will update the aide but is interested in [facility] RN if possible. CSW updated RNCM with request.    Expected Discharge Plan:  (TBD) Barriers to Discharge: Continued Medical Work up               Expected Discharge Plan and Services       Living arrangements for the past 2 months: Single Family Home                                       Social Drivers of Health (SDOH) Interventions SDOH Screenings   Food Insecurity: No Food Insecurity (02/17/2024)  Housing: Low Risk (02/17/2024)  Transportation Needs: No Transportation Needs (02/17/2024)  Utilities: Not At Risk (02/17/2024)  Social Connections: Socially Isolated (02/17/2024)  Tobacco Use: Low Risk (02/17/2024)    Readmission Risk Interventions     No data to display

## 2024-02-20 DIAGNOSIS — R6520 Severe sepsis without septic shock: Secondary | ICD-10-CM | POA: Diagnosis not present

## 2024-02-20 DIAGNOSIS — J9600 Acute respiratory failure, unspecified whether with hypoxia or hypercapnia: Secondary | ICD-10-CM | POA: Diagnosis not present

## 2024-02-20 DIAGNOSIS — A419 Sepsis, unspecified organism: Secondary | ICD-10-CM | POA: Diagnosis not present

## 2024-02-20 LAB — COMPREHENSIVE METABOLIC PANEL WITH GFR
ALT: 26 U/L (ref 0–44)
AST: 32 U/L (ref 15–41)
Albumin: 3.8 g/dL (ref 3.5–5.0)
Alkaline Phosphatase: 51 U/L (ref 38–126)
Anion gap: 12 (ref 5–15)
BUN: 10 mg/dL (ref 6–20)
CO2: 23 mmol/L (ref 22–32)
Calcium: 9.1 mg/dL (ref 8.9–10.3)
Chloride: 102 mmol/L (ref 98–111)
Creatinine, Ser: 0.77 mg/dL (ref 0.44–1.00)
GFR, Estimated: >60 mL/min
Glucose, Bld: 59 mg/dL — ABNORMAL LOW (ref 70–99)
Potassium: 4.1 mmol/L (ref 3.5–5.1)
Sodium: 137 mmol/L (ref 135–145)
Total Bilirubin: 0.4 mg/dL (ref 0.0–1.2)
Total Protein: 6.2 g/dL — ABNORMAL LOW (ref 6.5–8.1)

## 2024-02-20 LAB — CBC
HCT: 35.9 % — ABNORMAL LOW (ref 36.0–46.0)
Hemoglobin: 12.2 g/dL (ref 12.0–15.0)
MCH: 32.6 pg (ref 26.0–34.0)
MCHC: 34 g/dL (ref 30.0–36.0)
MCV: 96 fL (ref 80.0–100.0)
Platelets: 403 K/uL — ABNORMAL HIGH (ref 150–400)
RBC: 3.74 MIL/uL — ABNORMAL LOW (ref 3.87–5.11)
RDW: 13.2 % (ref 11.5–15.5)
WBC: 6.1 K/uL (ref 4.0–10.5)
nRBC: 0 % (ref 0.0–0.2)

## 2024-02-20 LAB — GLUCOSE, CAPILLARY
Glucose-Capillary: 117 mg/dL — ABNORMAL HIGH (ref 70–99)
Glucose-Capillary: 64 mg/dL — ABNORMAL LOW (ref 70–99)
Glucose-Capillary: 150 mg/dL — ABNORMAL HIGH (ref 70–99)
Glucose-Capillary: 183 mg/dL — ABNORMAL HIGH (ref 70–99)
Glucose-Capillary: 180 mg/dL — ABNORMAL HIGH (ref 70–99)

## 2024-02-20 LAB — LACTIC ACID, PLASMA: Lactic Acid, Venous: 1.6 mmol/L (ref 0.5–1.9)

## 2024-02-20 LAB — CORTISOL: Cortisol, Plasma: 21.5 ug/dL

## 2024-02-20 LAB — T4, FREE: Free T4: 1.13 ng/dL (ref 0.80–2.00)

## 2024-02-20 LAB — TSH: TSH: 1.16 u[IU]/mL (ref 0.350–4.500)

## 2024-02-20 MED ORDER — INSULIN GLARGINE 100 UNIT/ML ~~LOC~~ SOLN
25.0000 [IU] | Freq: Every day | SUBCUTANEOUS | Status: DC
  Administered 2024-02-20: 25 [IU] via SUBCUTANEOUS
  Filled 2024-02-20 (×2): qty 0.25

## 2024-02-20 MED ORDER — LACTATED RINGERS IV SOLN: INTRAVENOUS | Status: DC

## 2024-02-20 MED ORDER — INSULIN ASPART 100 UNIT/ML IJ SOLN
0.0000 [IU] | Freq: Every day | INTRAMUSCULAR | Status: DC
  Filled 2024-02-20: qty 1

## 2024-02-20 MED ORDER — LACTATED RINGERS IV BOLUS
1000.0000 mL | Freq: Once | INTRAVENOUS | Status: AC
  Administered 2024-02-20: 1000 mL via INTRAVENOUS

## 2024-02-20 MED ORDER — INSULIN ASPART 100 UNIT/ML IJ SOLN
0.0000 [IU] | Freq: Three times a day (TID) | INTRAMUSCULAR | Status: DC
  Administered 2024-02-20: 3 [IU] via SUBCUTANEOUS
  Administered 2024-02-20 – 2024-02-21 (×2): 2 [IU] via SUBCUTANEOUS
  Filled 2024-02-20: qty 2
  Filled 2024-02-20 (×2): qty 1

## 2024-02-20 NOTE — Plan of Care (Signed)

## 2024-02-20 NOTE — Progress Notes (Signed)
 "                                                                                                                                                                                                                                                                                PROGRESS NOTE     Patient Demographics:    Debbie Dorsey, is a 54 y.o. female, DOB - [birth year], FMW:[id number]  Outpatient Primary MD for the patient is Debbie, Ravisankar, MD    LOS - 2  Admit date - 02/17/2024    Chief Complaint  Patient presents with   Weakness   Generalized Body Aches       Brief Narrative (HPI from H&P)    53 history of schizophrenia, DM2, HLD, GERD, and peripheral neuropathy, who presents today with a one-week history of generalized malaise, chills, and progressive weakness.  Earlier this week, the patient sought care at [facility] for similar symptoms. According to the patient and her grandfather, no definitive etiology was identified at that time; however, she was discharged on a course of oral antibiotics. Despite adherence to the medication, she reports no clinical improvement and notes that her weakness has become progressively worse.  This morning, the patient was found with significant shivering/rigors. She is currently a limited historian due to her baseline mental health status, but she denies dysuria, polyuria, or increased urinary frequency. She further denies chest pain, shortness of breath, headache, or visual changes. There are no reported focal neurological deficits, such as asymmetric weakness, and the patient remains at her baseline mental status without new confusion now.     Subjective:   Patient in bed, appears comfortable, denies any headache, no fever, no chest pain or pressure, no shortness of breath , no abdominal pain. No focal weakness.  Overall feels significantly better.   Assessment  & Plan :   Sepsis (HCC) of unclear origin, twice in a few weeks, acute metabolic  encephalopathy due to combination of sepsis and nonketotic hyperosmolar state.  No clear source of infection, stable inflammatory markers, initial viral panel negative, of note she was admitted to [facility] hospital for sepsis of unclear origin few weeks ago, currently on empiric  antibiotics sepsis pathophysiology has resolved, mentation back to normal, no headache or focal deficits, head CT unremarkable.  For now we will we will de-escalate antibiotics, CT chest abdomen pelvis negative viral panel suggests patient has coronavirus URI infection.  Paranoid schizophrenia (HCC)  Mentation and mood stable, no acute issues, on Neurontin  only for now which will be continued.   GERD (gastroesophageal reflux disease)  Continue PPI   Hyperlipidemia Continue rosuvastatin   Urinary retention.  According to the patient she has been having issues for several weeks, had over 600 cc postvoid residual, Foley and Flomax  initiated, outpatient urology follow-up, likely will be discharged with Foley.    Nonketotic hyperosmolar state in a patient with type 2 diabetes mellitus (HCC) poor outpatient control due to high A1c, diabetic education.  Insulin  registered on 02/20/2024 for better control.  Still dehydrated with slightly low blood pressures overnight 02/20/2024, IV fluid bolus and maintenance and monitor.  Overall symptom-free.     Lab Results  Component Value Date   HGBA1C 9.9 (H) 02/17/2024   CBG (last 3)  Recent Labs    02/19/24 2054 02/20/24 0747 02/20/24 0817  GLUCAP 112* 64* 117*        Condition - Fair  Family Communication  : Family member sleeping bedside on 02/18/2024, updated Debbie Dorsey patient's grandfather [phone] was updated 02/18/2024, 02/19/24, 02/20/24  Code Status :  Full  Consults  :  DM educator  PUD Prophylaxis :  PPI   Procedures  :     CT head.  Nonacute      Disposition Plan  :    Status is: Inpatient   DVT Prophylaxis  :    Place TED hose Start: 02/20/24  0606 heparin  injection 5,000 Units Start: 02/17/24 1400 SCDs Start: 02/17/24 1142    Lab Results  Component Value Date   PLT 403 (H) 02/20/2024    Diet :  Diet Order             Diet Carb Modified Room service appropriate? Yes  Diet effective now                    Inpatient Medications  Scheduled Meds:  Chlorhexidine  Gluconate Cloth  6 each Topical Daily   gabapentin   300 mg Oral TID   heparin   5,000 Units Subcutaneous Q8H   insulin  aspart  0-15 Units Subcutaneous TID WC   insulin  aspart  0-5 Units Subcutaneous QHS   insulin  glargine  25 Units Subcutaneous QHS   lactobacillus  1 g Oral TID WC   pantoprazole   40 mg Oral Daily   polyethylene glycol  17 g Oral BID   rosuvastatin   5 mg Oral Daily   senna-docusate  1 tablet Oral QHS   sodium chloride  flush  3 mL Intravenous Q12H   tamsulosin   0.4 mg Oral Daily   Continuous Infusions:  lactated ringers  100 mL/hr at 02/20/24 0937    PRN Meds:.acetaminophen  **OR** acetaminophen , bisacodyl , hydrALAZINE , ipratropium, ondansetron  **OR** ondansetron  (ZOFRAN ) IV, traZODone   Antibiotics  :    Anti-infectives (From admission, onward)    Start     Dose/Rate Route Frequency Ordered Stop   02/18/24 1200  doxycycline  (VIBRA -TABS) tablet 100 mg        100 mg Oral Every 12 hours 02/18/24 1105 02/19/24 2147   02/17/24 2300  vancomycin  (VANCOREADY) IVPB 500 mg/100 mL  Status:  Discontinued        500 mg 100 mL/hr over 60 Minutes Intravenous Every 12 hours 02/17/24  1152 02/18/24 1105   02/17/24 1200  ceFEPIme  (MAXIPIME ) 2 g in sodium chloride  0.9 % 100 mL IVPB  Status:  Discontinued        2 g 200 mL/hr over 30 Minutes Intravenous Every 8 hours 02/17/24 1147 02/18/24 1105   02/17/24 1145  ceFEPIme  (MAXIPIME ) 2 g in sodium chloride  0.9 % 100 mL IVPB  Status:  Discontinued        2 g 200 mL/hr over 30 Minutes Intravenous  Once 02/17/24 1144 02/17/24 1147   02/17/24 1145  metroNIDAZOLE  (FLAGYL ) IVPB 500 mg  Status:  Discontinued         500 mg 100 mL/hr over 60 Minutes Intravenous Every 12 hours 02/17/24 1144 02/18/24 1105   02/17/24 1145  vancomycin  (VANCOREADY) IVPB 1250 mg/250 mL        1,250 mg 166.7 mL/hr over 90 Minutes Intravenous  Once 02/17/24 1144 02/17/24 1535   02/17/24 1130  ceFEPIme  (MAXIPIME ) 2 g in sodium chloride  0.9 % 100 mL IVPB  Status:  Discontinued        2 g 200 mL/hr over 30 Minutes Intravenous  Once 02/17/24 1117 02/17/24 1144   02/17/24 1130  metroNIDAZOLE  (FLAGYL ) IVPB 500 mg  Status:  Discontinued        500 mg 100 mL/hr over 60 Minutes Intravenous  Once 02/17/24 1117 02/17/24 1144   02/17/24 1130  vancomycin  (VANCOCIN ) IVPB 1000 mg/200 mL premix  Status:  Discontinued        1,000 mg 200 mL/hr over 60 Minutes Intravenous  Once 02/17/24 1117 02/17/24 1144         Objective:   Vitals:   02/20/24 0600 02/20/24 0615 02/20/24 0630 02/20/24 0745  BP: 100/60 103/63 101/66 102/65  Pulse: 95 98 97   Resp: (!) 21 18 16 18   Temp:    97.6 F (36.4 C)  TempSrc:    Oral  SpO2: 95% 97% 96%   Weight:      Height:        Wt Readings from Last 3 Encounters:  02/20/24 56.3 kg  11/09/21 60.8 kg  11/02/21 60.8 kg     Intake/Output Summary (Last 24 hours) at 02/20/2024 0942 Last data filed at 02/20/2024 9356 Gross per 24 hour  Intake 1000 ml  Output 1575 ml  Net -575 ml     Physical Exam  Awake Alert, No new F.N deficits, Normal affect .AT,PERRAL Supple Neck, No JVD,   Symmetrical Chest wall movement, Good air movement bilaterally, CTAB RRR,No Gallops,Rubs or new Murmurs,  +ve B.Sounds, Abd Soft, No tenderness,   No Cyanosis, Clubbing or edema     Data Review:    Recent Labs  Lab 02/17/24 0740 02/17/24 0803 02/18/24 0327 02/19/24 0335 02/20/24 0613  WBC 3.7*  --  4.3 4.3 6.1  HGB 14.1 14.6 12.2 10.9* 12.2  HCT 41.6 43.0 35.4* 32.0* 35.9*  PLT 568*  --  503* 424* 403*  MCV 96.7  --  94.4 96.1 96.0  MCH 32.8  --  32.5 32.7 32.6  MCHC 33.9  --  34.5 34.1 34.0   RDW 13.4  --  13.2 13.3 13.2  LYMPHSABS 1.8  --   --  2.4  --   MONOABS 0.3  --   --  0.4  --   EOSABS 0.1  --   --  0.1  --   BASOSABS 0.0  --   --  0.0  --     Recent Labs  Lab 02/17/24 0740 02/17/24 0803 02/17/24 0804 02/17/24 0931 02/17/24 1143 02/17/24 1221 02/17/24 1541 02/17/24 1634 02/18/24 0327 02/18/24 0754 02/19/24 0335 02/20/24 0613  NA 131* 133*  --   --   --   --   --   --  134*  --  134* 137  K 4.6 4.4  --   --   --   --   --   --  4.0  --  3.5 4.1  CL 97*  --   --   --   --   --   --   --  99  --  99 102  CO2 19*  --   --   --   --   --   --   --  23  --  25 23  ANIONGAP 15  --   --   --   --   --   --   --  12  --  10 12  GLUCOSE 303*  --   --   --   --   --   --   --  95  --  201* 59*  BUN 11  --   --   --   --   --   --   --  8  --  8 10  CREATININE 0.92  --   --   --   --   --   --   --  0.70  --  0.71 0.77  AST 28  --   --   --   --   --   --   --  33  --   --  32  ALT 34  --   --   --   --   --   --   --  28  --   --  26  ALKPHOS 67  --   --   --   --   --   --   --  56  --   --  51  BILITOT 0.8  --   --   --   --   --   --   --  0.6  --   --  0.4  ALBUMIN 4.5  --   --   --   --   --   --   --  4.0  --   --  3.8  CRP  --   --   --   --  <0.5  --   --   --   --  <0.5  --   --   PROCALCITON  --   --   --   --   --   --   --   --   --  <0.10  --   --   LATICACIDVEN  --   --    < > 2.2*  --  2.7* 2.8* 2.4*  --   --   --  1.6  INR 1.0  --   --   --  1.1  --   --   --   --   --   --   --   TSH  --   --   --   --   --   --   --   --   --   --   --  1.160  HGBA1C 9.9*  --   --   --   --   --   --   --   --   --   --   --  MG  --   --   --   --  2.4  --   --   --   --  2.0 2.1  --   PHOS  --   --   --   --  1.7*  --   --   --   --   --  3.4  --   CALCIUM  9.5  --   --   --   --   --   --   --  9.3  --  8.3* 9.1   < > = values in this interval not displayed.      Recent Labs  Lab 02/17/24 0740 02/17/24 0804 02/17/24 0931 02/17/24 1143 02/17/24 1221  02/17/24 1541 02/17/24 1634 02/18/24 0327 02/18/24 0754 02/19/24 0335 02/20/24 0613  CRP  --   --   --  <0.5  --   --   --   --  <0.5  --   --   PROCALCITON  --   --   --   --   --   --   --   --  <0.10  --   --   LATICACIDVEN  --    < > 2.2*  --  2.7* 2.8* 2.4*  --   --   --  1.6  INR 1.0  --   --  1.1  --   --   --   --   --   --   --   TSH  --   --   --   --   --   --   --   --   --   --  1.160  HGBA1C 9.9*  --   --   --   --   --   --   --   --   --   --   MG  --   --   --  2.4  --   --   --   --  2.0 2.1  --   CALCIUM  9.5  --   --   --   --   --   --  9.3  --  8.3* 9.1   < > = values in this interval not displayed.    --------------------------------------------------------------------------------------------------------------- No results found for: CHOL, HDL, LDLCALC, LDLDIRECT, TRIG, CHOLHDL  Lab Results  Component Value Date   HGBA1C 9.9 (H) 02/17/2024   Recent Labs    02/20/24 0613  TSH 1.160  FREET4 1.13   No results for input(s): VITAMINB12, FOLATE, FERRITIN, TIBC, IRON, RETICCTPCT in the last 72 hours. ------------------------------------------------------------------------------------------------------------------ Cardiac Enzymes No results for input(s): CKMB, TROPONINI, MYOGLOBIN in the last 168 hours.  Invalid input(s): CK  Micro Results Recent Results (from the past 240 hours)  Culture, blood (Routine x 2)     Status: None   Collection Time: 02/10/24 12:33 PM   Specimen: Right Antecubital; Blood  Result Value Ref Range Status   Specimen Description   Final    RIGHT ANTECUBITAL BOTTLES DRAWN AEROBIC AND ANAEROBIC Performed at [facility], [address], [place], [place] [address]    Special Requests   Final    Blood Culture adequate volume Performed at [facility], [address] [place], [place] [address]    Culture   Final    NO GROWTH 5 DAYS Performed at [facility], [address], [place], [place] [address]  Report Status 02/15/2024 FINAL  Final  Culture, blood (Routine x 2)     Status: None (Preliminary result)   Collection Time: 02/10/24  1:00 PM   Specimen: Site Not Specified; Blood  Result Value Ref Range Status   Specimen Description SITE NOT SPECIFIED BLOOD  Final   Special Requests   Final    Blood Culture adequate volume BOTTLES DRAWN AEROBIC AND ANAEROBIC   Culture  Setup Time   Final    GRAM POSITIVE RODS AEROBIC BOTTLE ONLY CRITICAL RESULT CALLED TO, READ BACK BY AND VERIFIED WITH: RN Pattie D on [phone] @1220  by SM    Culture   Final    GRAM POSITIVE RODS ISOLATE REFERRED FOR ID/SUSCEPT Performed at [facility], [address], [place], [place] [address]    Report Status PENDING  Incomplete  Resp panel by RT-PCR (RSV, Flu A&B, Covid) Anterior Nasal Swab     Status: None   Collection Time: 02/10/24  2:01 PM   Specimen: Anterior Nasal Swab  Result Value Ref Range Status   SARS Coronavirus 2 by RT PCR NEGATIVE NEGATIVE Final    Comment: (NOTE) SARS-CoV-2 target nucleic acids are NOT DETECTED.  The SARS-CoV-2 RNA is generally detectable in upper respiratory specimens during the acute phase of infection. The lowest concentration of SARS-CoV-2 viral copies this assay can detect is 138 copies/mL. A negative result does not preclude SARS-Cov-2 infection and should not be used as the sole basis for treatment or other patient management decisions. A negative result may occur with  improper specimen collection/handling, submission of specimen other than nasopharyngeal swab, presence of viral mutation(s) within the areas targeted by this assay, and inadequate number of viral copies(<138 copies/mL). A negative result must be combined with clinical observations, patient history, and epidemiological information. The expected result is Negative.  Fact Sheet for Patients:  bloggercourse.com  Fact  Sheet for Healthcare Providers:  seriousbroker.it  This test is no t yet approved or cleared by the [place]  FDA and  has been authorized for detection and/or diagnosis of SARS-CoV-2 by FDA under an Emergency Use Authorization (EUA). This EUA will remain  in effect (meaning this test can be used) for the duration of the COVID-19 declaration under Section 564(b)(1) of the Act, 21 U.S.C.section 360bbb-3(b)(1), unless the authorization is terminated  or revoked sooner.       Influenza A by PCR NEGATIVE NEGATIVE Final   Influenza B by PCR NEGATIVE NEGATIVE Final    Comment: (NOTE) The Xpert Xpress SARS-CoV-2/FLU/RSV plus assay is intended as an aid in the diagnosis of influenza from Nasopharyngeal swab specimens and should not be used as a sole basis for treatment. Nasal washings and aspirates are unacceptable for Xpert Xpress SARS-CoV-2/FLU/RSV testing.  Fact Sheet for Patients: bloggercourse.com  Fact Sheet for Healthcare Providers: seriousbroker.it  This test is not yet approved or cleared by the [place]  FDA and has been authorized for detection and/or diagnosis of SARS-CoV-2 by FDA under an Emergency Use Authorization (EUA). This EUA will remain in effect (meaning this test can be used) for the duration of the COVID-19 declaration under Section 564(b)(1) of the Act, 21 U.S.C. section 360bbb-3(b)(1), unless the authorization is terminated or revoked.     Resp Syncytial Virus by PCR NEGATIVE NEGATIVE Final    Comment: (NOTE) Fact Sheet for Patients: bloggercourse.com  Fact Sheet for Healthcare Providers: seriousbroker.it  This test is not yet approved or cleared by the [place]  FDA and has been authorized for  detection and/or diagnosis of SARS-CoV-2 by FDA under an Emergency Use Authorization (EUA). This EUA will remain in effect  (meaning this test can be used) for the duration of the COVID-19 declaration under Section 564(b)(1) of the Act, 21 U.S.C. section 360bbb-3(b)(1), unless the authorization is terminated or revoked.  Performed at [facility], [address], [place], [place] [address]   Blood Culture (routine x 2)     Status: None (Preliminary result)   Collection Time: 02/17/24  7:40 AM   Specimen: BLOOD  Result Value Ref Range Status   Specimen Description BLOOD RIGHT ANTECUBITAL  Final   Special Requests   Final    BOTTLES DRAWN AEROBIC AND ANAEROBIC Blood Culture results may not be optimal due to an inadequate volume of blood received in culture bottles   Culture   Final    NO GROWTH 3 DAYS Performed at [facility], [address] [place], [place] [address]    Report Status PENDING  Incomplete  Resp panel by RT-PCR (RSV, Flu A&B, Covid) Anterior Nasal Swab     Status: None   Collection Time: 02/17/24  7:41 AM   Specimen: Anterior Nasal Swab  Result Value Ref Range Status   SARS Coronavirus 2 by RT PCR NEGATIVE NEGATIVE Final   Influenza A by PCR NEGATIVE NEGATIVE Final   Influenza B by PCR NEGATIVE NEGATIVE Final    Comment: (NOTE) The Xpert Xpress SARS-CoV-2/FLU/RSV plus assay is intended as an aid in the diagnosis of influenza from Nasopharyngeal swab specimens and should not be used as a sole basis for treatment. Nasal washings and aspirates are unacceptable for Xpert Xpress SARS-CoV-2/FLU/RSV testing.  Fact Sheet for Patients: bloggercourse.com  Fact Sheet for Healthcare Providers: seriousbroker.it  This test is not yet approved or cleared by the [place]  FDA and has been authorized for detection and/or diagnosis of SARS-CoV-2 by FDA under an Emergency Use Authorization (EUA). This EUA will remain in effect (meaning this test can be used) for the duration of the COVID-19 declaration under Section  564(b)(1) of the Act, 21 U.S.C. section 360bbb-3(b)(1), unless the authorization is terminated or revoked.     Resp Syncytial Virus by PCR NEGATIVE NEGATIVE Final    Comment: (NOTE) Fact Sheet for Patients: bloggercourse.com  Fact Sheet for Healthcare Providers: seriousbroker.it  This test is not yet approved or cleared by the [place]  FDA and has been authorized for detection and/or diagnosis of SARS-CoV-2 by FDA under an Emergency Use Authorization (EUA). This EUA will remain in effect (meaning this test can be used) for the duration of the COVID-19 declaration under Section 564(b)(1) of the Act, 21 U.S.C. section 360bbb-3(b)(1), unless the authorization is terminated or revoked.  Performed at [facility], [address] [place], [place] [address]   Blood Culture (routine x 2)     Status: None (Preliminary result)   Collection Time: 02/17/24  7:45 AM   Specimen: BLOOD  Result Value Ref Range Status   Specimen Description BLOOD SITE NOT SPECIFIED  Final   Special Requests   Final    BOTTLES DRAWN AEROBIC AND ANAEROBIC Blood Culture results may not be optimal due to an inadequate volume of blood received in culture bottles   Culture   Final    NO GROWTH 3 DAYS Performed at [facility], [address] [place], [place] [address]    Report Status PENDING  Incomplete  Respiratory (~20 pathogens) panel by PCR  Status: Abnormal   Collection Time: 02/18/24 11:23 AM   Specimen: Nasopharyngeal Swab; Respiratory  Result Value Ref Range Status   Adenovirus NOT DETECTED NOT DETECTED Final   Coronavirus 229E NOT DETECTED NOT DETECTED Final    Comment: (NOTE) The Coronavirus on the Respiratory Panel, DOES NOT test for the novel  Coronavirus (2019 nCoV)    Coronavirus HKU1 DETECTED (A) NOT DETECTED Final   Coronavirus NL63 NOT DETECTED NOT DETECTED Final   Coronavirus OC43 NOT DETECTED NOT DETECTED Final    Metapneumovirus NOT DETECTED NOT DETECTED Final   Rhinovirus / Enterovirus NOT DETECTED NOT DETECTED Final   Influenza A NOT DETECTED NOT DETECTED Final   Influenza B NOT DETECTED NOT DETECTED Final   Parainfluenza Virus 1 NOT DETECTED NOT DETECTED Final   Parainfluenza Virus 2 NOT DETECTED NOT DETECTED Final   Parainfluenza Virus 3 NOT DETECTED NOT DETECTED Final   Parainfluenza Virus 4 NOT DETECTED NOT DETECTED Final   Respiratory Syncytial Virus NOT DETECTED NOT DETECTED Final   Bordetella pertussis NOT DETECTED NOT DETECTED Final   Bordetella Parapertussis NOT DETECTED NOT DETECTED Final   Chlamydophila pneumoniae NOT DETECTED NOT DETECTED Final   Mycoplasma pneumoniae NOT DETECTED NOT DETECTED Final    Comment: Performed at [facility], [address], [place], [place] [address]    Radiology Report CT CHEST ABDOMEN PELVIS W CONTRAST Result Date: 02/18/2024 CLINICAL DATA:  Sepsis, shortness of breath EXAM: CT CHEST, ABDOMEN, AND PELVIS WITH CONTRAST TECHNIQUE: Multidetector CT imaging of the chest, abdomen and pelvis was performed following the standard protocol during bolus administration of intravenous contrast. RADIATION DOSE REDUCTION: This exam was performed according to the departmental dose-optimization program which includes automated exposure control, adjustment of the mA and/or kV according to patient size and/or use of iterative reconstruction technique. CONTRAST:  75mL OMNIPAQUE  IOHEXOL  350 MG/ML SOLN COMPARISON:  02/18/2024, 07/30/2020 FINDINGS: CT CHEST FINDINGS Cardiovascular: The heart is unremarkable without pericardial effusion. No evidence of thoracic aortic aneurysm or dissection. Mediastinum/Nodes: No enlarged mediastinal, hilar, or axillary lymph nodes. Thyroid  gland, trachea, and esophagus demonstrate no significant findings. Lungs/Pleura: No acute airspace disease, effusion, or pneumothorax. Assessment of the lung parenchyma is slightly limited by respiratory  motion during the exam. Musculoskeletal: No acute or destructive bony abnormalities. Reconstructed images demonstrate no additional findings. CT ABDOMEN PELVIS FINDINGS Hepatobiliary: No focal liver abnormality is seen. No gallstones, gallbladder wall thickening, or biliary dilatation. Pancreas: Unremarkable. No pancreatic ductal dilatation or surrounding inflammatory changes. Spleen: Normal in size without focal abnormality. Adrenals/Urinary Tract: Kidneys enhance normally and symmetrically. Nonobstructing 4 mm calculus lower pole right kidney. No obstructive uropathy within either kidney. The adrenals and bladder are unremarkable. Stomach/Bowel: No bowel obstruction or ileus. The appendix is not well visualized. No bowel wall thickening or inflammatory change. Moderate retained stool throughout the colon. Vascular/Lymphatic: Aortic atherosclerosis. No enlarged abdominal or pelvic lymph nodes. Reproductive: Heterogeneous mildly enlarged uterus consistent with underlying fibroids. No adnexal masses. Other: No free fluid or free intraperitoneal gas. No abdominal wall hernia. Musculoskeletal: No acute or destructive bony abnormalities. Reconstructed images demonstrate no additional findings. IMPRESSION: 1. No acute intrathoracic, intra-abdominal, or intrapelvic process. 2. Stable 4 mm nonobstructing right renal calculus. 3. Moderate stool throughout the colon.  No obstruction or ileus. 4. Stable fibroid uterus. 5.  Aortic Atherosclerosis (ICD10-I70.0). Electronically Signed   By: Ozell Daring M.D.   On: 02/18/2024 15:08     Signature  -   Lavada Stank M.D on 02/20/2024 at 9:42  AM   -  To page go to [email or web address]   "

## 2024-02-21 ENCOUNTER — Other Ambulatory Visit (HOSPITAL_COMMUNITY): Payer: Self-pay

## 2024-02-21 DIAGNOSIS — A419 Sepsis, unspecified organism: Secondary | ICD-10-CM | POA: Diagnosis not present

## 2024-02-21 DIAGNOSIS — J9600 Acute respiratory failure, unspecified whether with hypoxia or hypercapnia: Secondary | ICD-10-CM | POA: Diagnosis not present

## 2024-02-21 DIAGNOSIS — R6520 Severe sepsis without septic shock: Secondary | ICD-10-CM | POA: Diagnosis not present

## 2024-02-21 LAB — GLUCOSE, CAPILLARY: Glucose-Capillary: 133 mg/dL — ABNORMAL HIGH (ref 70–99)

## 2024-02-21 MED ORDER — DOCUSATE SODIUM 100 MG PO CAPS
100.0000 mg | ORAL_CAPSULE | Freq: Every day | ORAL | 0 refills | Status: AC
  Filled 2024-02-21: qty 30, 30d supply, fill #0

## 2024-02-21 MED ORDER — HUMALOG KWIKPEN 100 UNIT/ML ~~LOC~~ SOPN: PEN_INJECTOR | SUBCUTANEOUS | Status: AC

## 2024-02-21 MED ORDER — POLYETHYLENE GLYCOL 3350 17 GM/SCOOP PO POWD
17.0000 g | Freq: Every day | ORAL | 0 refills | Status: AC
  Filled 2024-02-21: qty 238, 14d supply, fill #0

## 2024-02-21 MED ORDER — TAMSULOSIN HCL 0.4 MG PO CAPS
0.4000 mg | ORAL_CAPSULE | Freq: Every day | ORAL | 0 refills | Status: AC
  Filled 2024-02-21: qty 30, 30d supply, fill #0

## 2024-02-21 NOTE — Discharge Instructions (Signed)
 Follow with Primary MD Avva, Ravisankar, MD in 7 days   Get CBC, CMP, Magnesium , 2 view Chest X ray -  checked next visit with your primary MD    Activity: As tolerated with Full fall precautions use walker/cane & assistance as needed  Disposition Home   Diet: Heart Healthy low carbohydrate, check CBGs q. ACHS  Special Instructions: If you have smoked or chewed Tobacco  in the last 2 yrs please stop smoking, stop any regular Alcohol  and or any Recreational drug use.  On your next visit with your primary care physician please Get Medicines reviewed and adjusted.  Please request your Prim.MD to go over all Hospital Tests and Procedure/Radiological results at the follow up, please get all Hospital records sent to your Prim MD by signing hospital release before you go home.  If you experience worsening of your admission symptoms, develop shortness of breath, life threatening emergency, suicidal or homicidal thoughts you must seek medical attention immediately by calling 911 or calling your MD immediately  if symptoms less severe.  You Must read complete instructions/literature along with all the possible adverse reactions/side effects for all the Medicines you take and that have been prescribed to you. Take any new Medicines after you have completely understood and accpet all the possible adverse reactions/side effects.   Do not drive when taking Pain medications.  Do not take more than prescribed Pain, Sleep and Anxiety Medications  Wear Seat belts while driving.

## 2024-02-21 NOTE — Discharge Summary (Signed)
 "                                                                                                                                                                               Discharge summary note.  Debbie Dorsey FMW:[id number] DOB: [birth year] DOA: 02/17/2024  PCP: Avva, Ravisankar, MD  Admit date: 02/17/2024  Discharge date: 02/21/2024  Admitted From: Home   Disposition:  Home   Recommendations for Outpatient Follow-up:   Follow up with PCP in 1-2 weeks  PCP Please obtain BMP/CBC, 2 view CXR in 1week,  (see Discharge instructions)   PCP Please follow up on the following pending results:    Home Health: None   Equipment/Devices: Foley  Consultations: None  Discharge Condition: Stable    CODE STATUS: Full    Diet Recommendation: Heart Healthy Low Carb    Chief Complaint  Patient presents with   Weakness   Generalized Body Aches     Brief history of present illness from the day of admission and additional interim summary    53 history of schizophrenia, DM2, HLD, GERD, and peripheral neuropathy, who presents today with a one-week history of generalized malaise, chills, and progressive weakness.  Earlier this week, the patient sought care at [facility] for similar symptoms. According to the patient and her grandfather, no definitive etiology was identified at that time; however, she was discharged on a course of oral antibiotics. Despite adherence to the medication, she reports no clinical improvement and notes that her weakness has become progressively worse.  This morning, the patient was found with significant shivering/rigors. She is currently a limited historian due to her baseline mental health status, but she denies dysuria, polyuria, or increased urinary frequency. She further denies chest pain, shortness of breath, headache, or visual changes. There are no reported focal neurological deficits, such as asymmetric weakness, and the patient remains at her baseline  mental status without new confusion now.                                                                   Hospital Course   Sepsis [facility]) of unclear origin, twice in a few weeks, acute metabolic encephalopathy due to combination of sepsis and nonketotic hyperosmolar state.  No clear source of infection, stable inflammatory markers, initial viral panel negative, of note she was admitted to [facility] for sepsis of unclear origin few weeks ago, he was initially on empiric antibiotics sepsis pathophysiology  has resolved, mentation back to normal, no headache or focal deficits, head CT unremarkable. CT abdomen pelvis negative, she did have coronavirus URI non-COVID, now back to baseline eager to go home.  Completely symptom-free.   Paranoid schizophrenia (HCC)  Mentation and mood stable, no acute issues, on Neurontin  only for now which will be continued.   GERD (gastroesophageal reflux disease)  Continue PPI   Hyperlipidemia Continue rosuvastatin    Urinary retention.  According to the patient she has been having issues for several weeks, had over 600 cc postvoid residual, Foley and Flomax  initiated, outpatient urology follow-up, will be discharged with Foley.     Nonketotic hyperosmolar state in a patient with type 2 diabetes mellitus (HCC) poor outpatient control due to high A1c, diabetic education provided, counseled on compliance, treated with IV fluids and initially insulin  drip.  Now stable.  Counseled to check CBGs q. ACHS and follow-up with PCP within a week with CBG log for further adjustment.  Lab Results  Component Value Date   HGBA1C 9.9 (H) 02/17/2024   CBG (last 3)  Recent Labs    02/20/24 1547 02/20/24 2114 02/21/24 0744  GLUCAP 183* 180* 133*    Discharge diagnosis     Principal Problem:   Sepsis (HCC) Active Problems:   Acute encephalopathy   Paranoid schizophrenia (HCC)   Type 2 diabetes mellitus (HCC)   DKA (diabetic ketoacidosis) (HCC)    Hyperlipidemia   GERD (gastroesophageal reflux disease)   SIRS (systemic inflammatory response syndrome) [facility])    Discharge instructions    Discharge Instructions     Discharge instructions   Complete by: As directed    Follow with Primary MD Avva, Ravisankar, MD in 7 days   Get CBC, CMP, Magnesium , 2 view Chest X ray -  checked next visit with your primary MD    Activity: As tolerated with Full fall precautions use walker/cane & assistance as needed  Disposition Home   Diet: Heart Healthy low carbohydrate, check CBGs q. ACHS  Special Instructions: If you have smoked or chewed Tobacco  in the last 2 yrs please stop smoking, stop any regular Alcohol  and or any Recreational drug use.  On your next visit with your primary care physician please Get Medicines reviewed and adjusted.  Please request your Prim.MD to go over all Hospital Tests and Procedure/Radiological results at the follow up, please get all Hospital records sent to your Prim MD by signing hospital release before you go home.  If you experience worsening of your admission symptoms, develop shortness of breath, life threatening emergency, suicidal or homicidal thoughts you must seek medical attention immediately by calling 911 or calling your MD immediately  if symptoms less severe.  You Must read complete instructions/literature along with all the possible adverse reactions/side effects for all the Medicines you take and that have been prescribed to you. Take any new Medicines after you have completely understood and accpet all the possible adverse reactions/side effects.   Do not drive when taking Pain medications.  Do not take more than prescribed Pain, Sleep and Anxiety Medications  Wear Seat belts while driving.   Increase activity slowly   Complete by: As directed        Discharge Medications   Allergies as of 02/21/2024   No Known Allergies      Medication List     STOP taking these medications     amoxicillin-clavulanate 875-125 MG tablet Commonly known as: AUGMENTIN       TAKE  these medications    acetaminophen  500 MG tablet Commonly known as: TYLENOL  Take 500 mg by mouth every 6 (six) hours as needed for mild pain (pain score 1-3) or moderate pain (pain score 4-6).   albuterol  108 (90 Base) MCG/ACT inhaler Commonly known as: VENTOLIN  HFA Inhale 2 puffs into the lungs every 4 (four) hours as needed for shortness of breath or wheezing.   Allergy Eye 0.025-0.3 % ophthalmic solution Generic drug: naphazoline-pheniramine Place 1-2 drops into both eyes 4 (four) times daily as needed for eye irritation.   Dexcom G7 Sensor Misc SMARTSIG:Every 3 Days   docusate sodium  100 MG capsule Commonly known as: Colace Take 1 capsule (100 mg total) by mouth daily.   Droplet Pen Needles 31G X 5 MM Misc Generic drug: Insulin  Pen Needle 3 (three) times daily.   ferrous sulfate  325 (65 FE) MG tablet Take 325 mg by mouth daily.   gabapentin  300 MG capsule Commonly known as: NEURONTIN  Take 1 capsule (300 mg total) by mouth 3 (three) times daily.   HumaLOG  KwikPen 100 UNIT/ML KwikPen Generic drug: insulin  lispro Before each meal 3 times a day, 140-199 - 2 units, 200-250 - 4 units, 251-299 - 6 units,  300-349 - 8 units,  350 or above 10 units. What changed:  how much to take how to take this when to take this additional instructions   Lantus  SoloStar 100 UNIT/ML Solostar Pen Generic drug: insulin  glargine Inject 20 Units into the skin daily.   ondansetron  4 MG disintegrating tablet Commonly known as: ZOFRAN -ODT Take 1 tablet (4 mg total) by mouth every 8 (eight) hours as needed for nausea or vomiting.   pantoprazole  40 MG tablet Commonly known as: PROTONIX  Take 1 tablet (40 mg total) by mouth 2 (two) times daily before a meal. What changed: when to take this   perphenazine  4 MG tablet Commonly known as: TRILAFON  1 a day 3 at hs What changed:  how much to take how to  take this when to take this additional instructions   polyethylene glycol 17 g packet Commonly known as: MiraLax  Take 17 g by mouth daily.   rosuvastatin  5 MG tablet Commonly known as: CRESTOR  Take 5 mg by mouth daily.   tamsulosin  0.4 MG Caps capsule Commonly known as: FLOMAX  Take 1 capsule (0.4 mg total) by mouth daily.   vitamin C 1000 MG tablet Take 1,000 mg by mouth in the morning.         Contact information for follow-up providers     Avva, Ravisankar, MD. Schedule an appointment as soon as possible for a visit in 1 week(s).   Specialty: Internal Medicine Contact information: [address] [place] [address] [phone]         Alvaro Ricardo KATHEE Mickey., MD. Schedule an appointment as soon as possible for a visit in 1 week(s).   Specialty: Urology Contact information: [address] [place] [address] [phone]              Contact information for after-discharge care     Home Medical Care     [facility]) .   Service: Home Health Services Contact information: [address] [place]  [phone]                     Major procedures and Radiology Reports - PLEASE review detailed and final reports thoroughly  -  CT CHEST ABDOMEN PELVIS W CONTRAST Result Date: 02/18/2024 CLINICAL DATA:  Sepsis, shortness of breath EXAM: CT CHEST, ABDOMEN, AND PELVIS WITH CONTRAST TECHNIQUE: Multidetector CT imaging of the chest, abdomen and pelvis was performed following the standard protocol during bolus administration of intravenous contrast. RADIATION DOSE REDUCTION: This exam was performed according to the departmental dose-optimization program which includes automated exposure control, adjustment of the mA and/or kV according to patient size and/or use of iterative reconstruction technique. CONTRAST:  75mL OMNIPAQUE  IOHEXOL  350 MG/ML SOLN COMPARISON:  02/18/2024,  07/30/2020 FINDINGS: CT CHEST FINDINGS Cardiovascular: The heart is unremarkable without pericardial effusion. No evidence of thoracic aortic aneurysm or dissection. Mediastinum/Nodes: No enlarged mediastinal, hilar, or axillary lymph nodes. Thyroid  gland, trachea, and esophagus demonstrate no significant findings. Lungs/Pleura: No acute airspace disease, effusion, or pneumothorax. Assessment of the lung parenchyma is slightly limited by respiratory motion during the exam. Musculoskeletal: No acute or destructive bony abnormalities. Reconstructed images demonstrate no additional findings. CT ABDOMEN PELVIS FINDINGS Hepatobiliary: No focal liver abnormality is seen. No gallstones, gallbladder wall thickening, or biliary dilatation. Pancreas: Unremarkable. No pancreatic ductal dilatation or surrounding inflammatory changes. Spleen: Normal in size without focal abnormality. Adrenals/Urinary Tract: Kidneys enhance normally and symmetrically. Nonobstructing 4 mm calculus lower pole right kidney. No obstructive uropathy within either kidney. The adrenals and bladder are unremarkable. Stomach/Bowel: No bowel obstruction or ileus. The appendix is not well visualized. No bowel wall thickening or inflammatory change. Moderate retained stool throughout the colon. Vascular/Lymphatic: Aortic atherosclerosis. No enlarged abdominal or pelvic lymph nodes. Reproductive: Heterogeneous mildly enlarged uterus consistent with underlying fibroids. No adnexal masses. Other: No free fluid or free intraperitoneal gas. No abdominal wall hernia. Musculoskeletal: No acute or destructive bony abnormalities. Reconstructed images demonstrate no additional findings. IMPRESSION: 1. No acute intrathoracic, intra-abdominal, or intrapelvic process. 2. Stable 4 mm nonobstructing right renal calculus. 3. Moderate stool throughout the colon.  No obstruction or ileus. 4. Stable fibroid uterus. 5.  Aortic Atherosclerosis (ICD10-I70.0). Electronically  Signed   By: Ozell Daring M.D.   On: 02/18/2024 15:08   DG Chest Port 1 View Result Date: 02/18/2024 EXAM: 1 VIEW XRAY OF THE CHEST 02/18/2024 06:45:57 AM COMPARISON: 02/17/2024 CLINICAL HISTORY: SOB (shortness of breath) FINDINGS: LUNGS AND PLEURA: No focal pulmonary opacity. No pleural effusion. No pneumothorax. HEART AND MEDIASTINUM: No acute abnormality of the cardiac and mediastinal silhouettes. BONES AND SOFT TISSUES: No acute osseous abnormality. IMPRESSION: 1. No acute process. Electronically signed by: Waddell Calk MD MD 02/18/2024 06:58 AM EST RP Workstation: [id number]   CT Head Wo Contrast Result Date: 02/17/2024 EXAM: CT HEAD WITHOUT CONTRAST 02/17/2024 08:45:27 AM TECHNIQUE: CT of the head was performed without the administration of intravenous contrast. Automated exposure control, iterative reconstruction, and/or weight based adjustment of the mA/kV was utilized to reduce the radiation dose to as low as reasonably achievable. COMPARISON: Head CT 11/01/2018. CLINICAL HISTORY: 54 year old female with new onset headache, weakness, and body aches. FINDINGS: Streak artifact related to hair items along the posterior convexity. BRAIN AND VENTRICLES: No acute hemorrhage. No evidence of acute infarct. No hydrocephalus. No extra-axial collection. No mass effect or midline shift. Brain volume remains within normal limits for age. When allowing for streak artifact, gray-white differentiation remains within normal limits. Faint vascular calcification at the skull base. No suspicious intracranial vascular hyperdensity. ORBITS: No acute abnormality. SINUSES: Visible paranasal sinuses are clear. Tympanic cavities and mastoids are clear. SOFT TISSUES AND SKULL: No acute soft tissue abnormality. No skull fracture. IMPRESSION: 1. Normal for  age non-contrast head CT when allowing for streak artifact. Electronically signed by: Helayne Hurst MD MD 02/17/2024 08:51 AM EST RP Workstation: [id number]   DG Chest Port  1 View Result Date: 02/17/2024 EXAM: 1 VIEW XRAY OF THE CHEST 02/17/2024 08:11:17 AM COMPARISON: 02/10/2024 CLINICAL HISTORY: 54 year old female. Questionable sepsis - evaluate for abnormality. FINDINGS: LUNGS AND PLEURA: No focal pulmonary opacity. No pleural effusion. No pneumothorax. HEART AND MEDIASTINUM: No acute abnormality of the cardiac and mediastinal silhouettes. BONES AND SOFT TISSUES: No acute osseous abnormality. IMPRESSION: 1. No acute cardiopulmonary abnormality. Electronically signed by: Helayne Hurst MD MD 02/17/2024 08:24 AM EST RP Workstation: [id number]   DG Chest 2 View if patient is not in a treatment room. Result Date: 02/10/2024 EXAM: 2 VIEW(S) XRAY OF THE CHEST 02/10/2024 01:18:00 PM COMPARISON: 10/27/2018 CLINICAL HISTORY: Suspected Sepsis FINDINGS: LUNGS AND PLEURA: No focal pulmonary opacity. No pleural effusion. No pneumothorax. HEART AND MEDIASTINUM: No acute abnormality of the cardiac and mediastinal silhouettes. BONES AND SOFT TISSUES: No acute osseous abnormality. IMPRESSION: 1. No acute cardiopulmonary abnormality. Electronically signed by: Waddell Calk MD 02/10/2024 01:23 PM EST RP Workstation: [id number]    Micro Results     Recent Results (from the past 240 hours)  Blood Culture (routine x 2)     Status: None (Preliminary result)   Collection Time: 02/17/24  7:40 AM   Specimen: BLOOD  Result Value Ref Range Status   Specimen Description BLOOD RIGHT ANTECUBITAL  Final   Special Requests   Final    BOTTLES DRAWN AEROBIC AND ANAEROBIC Blood Culture results may not be optimal due to an inadequate volume of blood received in culture bottles   Culture   Final    NO GROWTH 3 DAYS Performed at [facility], [address], [place], [place] [address]    Report Status PENDING  Incomplete  Resp panel by RT-PCR (RSV, Flu A&B, Covid) Anterior Nasal Swab     Status: None   Collection Time: 02/17/24  7:41 AM   Specimen: Anterior Nasal Swab  Result Value Ref Range  Status   SARS Coronavirus 2 by RT PCR NEGATIVE NEGATIVE Final   Influenza A by PCR NEGATIVE NEGATIVE Final   Influenza B by PCR NEGATIVE NEGATIVE Final    Comment: (NOTE) The Xpert Xpress SARS-CoV-2/FLU/RSV plus assay is intended as an aid in the diagnosis of influenza from Nasopharyngeal swab specimens and should not be used as a sole basis for treatment. Nasal washings and aspirates are unacceptable for Xpert Xpress SARS-CoV-2/FLU/RSV testing.  Fact Sheet for Patients: bloggercourse.com  Fact Sheet for Healthcare Providers: seriousbroker.it  This test is not yet approved or cleared by the [place]  FDA and has been authorized for detection and/or diagnosis of SARS-CoV-2 by FDA under an Emergency Use Authorization (EUA). This EUA will remain in effect (meaning this test can be used) for the duration of the COVID-19 declaration under Section 564(b)(1) of the Act, 21 U.S.C. section 360bbb-3(b)(1), unless the authorization is terminated or revoked.     Resp Syncytial Virus by PCR NEGATIVE NEGATIVE Final    Comment: (NOTE) Fact Sheet for Patients: bloggercourse.com  Fact Sheet for Healthcare Providers: seriousbroker.it  This test is not yet approved or cleared by the [place]  FDA and has been authorized for detection and/or diagnosis of SARS-CoV-2 by FDA under an Emergency Use Authorization (EUA). This EUA will remain in effect (meaning this test can be used) for the duration of the COVID-19 declaration under Section 564(b)(1) of the Act,  21 U.S.C. section 360bbb-3(b)(1), unless the authorization is terminated or revoked.  Performed at [facility], [address], [place], [place] [address]   Blood Culture (routine x 2)     Status: None (Preliminary result)   Collection Time: 02/17/24  7:45 AM   Specimen: BLOOD  Result Value Ref Range Status   Specimen  Description BLOOD SITE NOT SPECIFIED  Final   Special Requests   Final    BOTTLES DRAWN AEROBIC AND ANAEROBIC Blood Culture results may not be optimal due to an inadequate volume of blood received in culture bottles   Culture   Final    NO GROWTH 3 DAYS Performed at [facility], [address], [place], [place] [address]    Report Status PENDING  Incomplete  Respiratory (~20 pathogens) panel by PCR     Status: Abnormal   Collection Time: 02/18/24 11:23 AM   Specimen: Nasopharyngeal Swab; Respiratory  Result Value Ref Range Status   Adenovirus NOT DETECTED NOT DETECTED Final   Coronavirus 229E NOT DETECTED NOT DETECTED Final    Comment: (NOTE) The Coronavirus on the Respiratory Panel, DOES NOT test for the novel  Coronavirus (2019 nCoV)    Coronavirus HKU1 DETECTED (A) NOT DETECTED Final   Coronavirus NL63 NOT DETECTED NOT DETECTED Final   Coronavirus OC43 NOT DETECTED NOT DETECTED Final   Metapneumovirus NOT DETECTED NOT DETECTED Final   Rhinovirus / Enterovirus NOT DETECTED NOT DETECTED Final   Influenza A NOT DETECTED NOT DETECTED Final   Influenza B NOT DETECTED NOT DETECTED Final   Parainfluenza Virus 1 NOT DETECTED NOT DETECTED Final   Parainfluenza Virus 2 NOT DETECTED NOT DETECTED Final   Parainfluenza Virus 3 NOT DETECTED NOT DETECTED Final   Parainfluenza Virus 4 NOT DETECTED NOT DETECTED Final   Respiratory Syncytial Virus NOT DETECTED NOT DETECTED Final   Bordetella pertussis NOT DETECTED NOT DETECTED Final   Bordetella Parapertussis NOT DETECTED NOT DETECTED Final   Chlamydophila pneumoniae NOT DETECTED NOT DETECTED Final   Mycoplasma pneumoniae NOT DETECTED NOT DETECTED Final    Comment: Performed at [facility], [address] [place], [place] [address]    Today   Subjective    Debbie Dorsey today has no headache,no chest abdominal pain,no new weakness tingling or numbness, feels much better wants to go home today.     Objective   Blood  pressure 114/73, pulse 90, temperature 98.9 F (37.2 C), temperature source Oral, resp. rate 18, height 5' 4.8 (1.646 m), weight 56.3 kg, SpO2 92%.   Intake/Output Summary (Last 24 hours) at 02/21/2024 0844 Last data filed at 02/21/2024 0600 Gross per 24 hour  Intake 1772.62 ml  Output 3650 ml  Net -1877.38 ml    Exam  Awake Alert, No new F.N deficits,    [place].AT,PERRAL Supple Neck,   Symmetrical Chest wall movement, Good air movement bilaterally, CTAB RRR,No Gallops,   +ve B.Sounds, Abd Soft, Non tender,  No Cyanosis, Clubbing or edema Foley in place   Data Review   Recent Labs  Lab 02/17/24 0740 02/17/24 0803 02/18/24 0327 02/19/24 0335 02/20/24 0613  WBC 3.7*  --  4.3 4.3 6.1  HGB 14.1 14.6 12.2 10.9* 12.2  HCT 41.6 43.0 35.4* 32.0* 35.9*  PLT 568*  --  503* 424* 403*  MCV 96.7  --  94.4 96.1 96.0  MCH 32.8  --  32.5 32.7 32.6  MCHC 33.9  --  34.5 34.1 34.0  RDW 13.4  --  13.2 13.3 13.2  LYMPHSABS 1.8  --   --  2.4  --   MONOABS 0.3  --   --  0.4  --   EOSABS 0.1  --   --  0.1  --   BASOSABS 0.0  --   --  0.0  --     Recent Labs  Lab 02/17/24 0740 02/17/24 0803 02/17/24 0804 02/17/24 0931 02/17/24 1143 02/17/24 1221 02/17/24 1541 02/17/24 1634 02/18/24 0327 02/18/24 0754 02/19/24 0335 02/20/24 0613  NA 131* 133*  --   --   --   --   --   --  134*  --  134* 137  K 4.6 4.4  --   --   --   --   --   --  4.0  --  3.5 4.1  CL 97*  --   --   --   --   --   --   --  99  --  99 102  CO2 19*  --   --   --   --   --   --   --  23  --  25 23  ANIONGAP 15  --   --   --   --   --   --   --  12  --  10 12  GLUCOSE 303*  --   --   --   --   --   --   --  95  --  201* 59*  BUN 11  --   --   --   --   --   --   --  8  --  8 10  CREATININE 0.92  --   --   --   --   --   --   --  0.70  --  0.71 0.77  AST 28  --   --   --   --   --   --   --  33  --   --  32  ALT 34  --   --   --   --   --   --   --  28  --   --  26  ALKPHOS 67  --   --   --   --   --   --   --  56   --   --  51  BILITOT 0.8  --   --   --   --   --   --   --  0.6  --   --  0.4  ALBUMIN 4.5  --   --   --   --   --   --   --  4.0  --   --  3.8  CRP  --   --   --   --  <0.5  --   --   --   --  <0.5  --   --   PROCALCITON  --   --   --   --   --   --   --   --   --  <0.10  --   --   LATICACIDVEN  --   --    < > 2.2*  --  2.7* 2.8* 2.4*  --   --   --  1.6  INR 1.0  --   --   --  1.1  --   --   --   --   --   --   --  TSH  --   --   --   --   --   --   --   --   --   --   --  1.160  HGBA1C 9.9*  --   --   --   --   --   --   --   --   --   --   --   MG  --   --   --   --  2.4  --   --   --   --  2.0 2.1  --   PHOS  --   --   --   --  1.7*  --   --   --   --   --  3.4  --   CALCIUM  9.5  --   --   --   --   --   --   --  9.3  --  8.3* 9.1   < > = values in this interval not displayed.    Total Time in preparing paper work, data evaluation and todays exam - 35 minutes  Signature  -    Lavada Stank M.D on 02/21/2024 at 8:44 AM   -  To page go to [email or web address]      "

## 2024-02-21 NOTE — Progress Notes (Signed)
 Notified grandfather of pt discharge and instructed on AVS, verbalized understanding of discharge instructions, meds and follow up appts. Denies any other questions or concerns at this time. Notified D/C lounge of pt ready for discharge and transport.

## 2024-02-22 LAB — CULTURE, BLOOD (ROUTINE X 2)
Culture: NO GROWTH
Culture: NO GROWTH

## 2024-02-25 ENCOUNTER — Emergency Department (HOSPITAL_COMMUNITY)
Admission: EM | Admit: 2024-02-25 | Discharge: 2024-02-25 | Disposition: A | Discharge status: Home / Self Care | Attending: Emergency Medicine | Admitting: Emergency Medicine

## 2024-02-25 ENCOUNTER — Other Ambulatory Visit: Payer: Self-pay

## 2024-02-25 DIAGNOSIS — E871 Hypo-osmolality and hyponatremia: Secondary | ICD-10-CM | POA: Insufficient documentation

## 2024-02-25 DIAGNOSIS — T83038A Leakage of other indwelling urethral catheter, initial encounter: Secondary | ICD-10-CM | POA: Diagnosis present

## 2024-02-25 DIAGNOSIS — Y732 Prosthetic and other implants, materials and accessory gastroenterology and urology devices associated with adverse incidents: Secondary | ICD-10-CM | POA: Insufficient documentation

## 2024-02-25 DIAGNOSIS — E119 Type 2 diabetes mellitus without complications: Secondary | ICD-10-CM | POA: Diagnosis not present

## 2024-02-25 DIAGNOSIS — T839XXA Unspecified complication of genitourinary prosthetic device, implant and graft, initial encounter: Secondary | ICD-10-CM

## 2024-02-25 DIAGNOSIS — R300 Dysuria: Secondary | ICD-10-CM | POA: Diagnosis not present

## 2024-02-25 DIAGNOSIS — J45909 Unspecified asthma, uncomplicated: Secondary | ICD-10-CM | POA: Insufficient documentation

## 2024-02-25 LAB — CBC WITH DIFFERENTIAL/PLATELET
Abs Immature Granulocytes: 0.02 K/uL (ref 0.00–0.07)
Basophils Absolute: 0 K/uL (ref 0.0–0.1)
Basophils Relative: 0 %
Eosinophils Absolute: 0 K/uL (ref 0.0–0.5)
Eosinophils Relative: 1 %
HCT: 38.9 % (ref 36.0–46.0)
Hemoglobin: 12.7 g/dL (ref 12.0–15.0)
Immature Granulocytes: 0 %
Lymphocytes Relative: 20 %
Lymphs Abs: 1.5 K/uL (ref 0.7–4.0)
MCH: 32.5 pg (ref 26.0–34.0)
MCHC: 32.6 g/dL (ref 30.0–36.0)
MCV: 99.5 fL (ref 80.0–100.0)
Monocytes Absolute: 0.4 K/uL (ref 0.1–1.0)
Monocytes Relative: 5 %
Neutro Abs: 5.4 K/uL (ref 1.7–7.7)
Neutrophils Relative %: 74 %
Platelets: 348 K/uL (ref 150–400)
RBC: 3.91 MIL/uL (ref 3.87–5.11)
RDW: 13.5 % (ref 11.5–15.5)
WBC: 7.3 K/uL (ref 4.0–10.5)
nRBC: 0 % (ref 0.0–0.2)

## 2024-02-25 LAB — URINALYSIS, W/ REFLEX TO CULTURE (INFECTION SUSPECTED)
Bilirubin Urine: NEGATIVE
Glucose, UA: >=500 mg/dL — AB
Ketones, ur: NEGATIVE mg/dL
Nitrite: NEGATIVE
Protein, ur: NEGATIVE mg/dL
Specific Gravity, Urine: 1.014 (ref 1.005–1.030)
pH: 6 (ref 5.0–8.0)

## 2024-02-25 LAB — BASIC METABOLIC PANEL WITH GFR
Anion gap: 13 (ref 5–15)
BUN: 11 mg/dL (ref 6–20)
CO2: 21 mmol/L — ABNORMAL LOW (ref 22–32)
Calcium: 9.5 mg/dL (ref 8.9–10.3)
Chloride: 98 mmol/L (ref 98–111)
Creatinine, Ser: 0.85 mg/dL (ref 0.44–1.00)
GFR, Estimated: >60 mL/min
Glucose, Bld: 323 mg/dL — ABNORMAL HIGH (ref 70–99)
Potassium: 5 mmol/L (ref 3.5–5.1)
Sodium: 132 mmol/L — ABNORMAL LOW (ref 135–145)

## 2024-02-25 MED ORDER — SODIUM CHLORIDE 0.9 % IV BOLUS
500.0000 mL | Freq: Once | INTRAVENOUS | Status: AC
  Administered 2024-02-25: 500 mL via INTRAVENOUS

## 2024-02-25 NOTE — ED Notes (Addendum)
 Called legal guardian made aware pt is up for discharge, pt aide on way to hospital to pick her up.

## 2024-02-25 NOTE — ED Provider Notes (Signed)
 "  Emergency Department Provider Note   I have reviewed the triage vital signs and the nursing notes.   HISTORY  Chief Complaint Catheter displlacement   HPI Debbie Dorsey is a 54 y.o. female with past area of asthma, diabetes, schizophrenia presents to the emergency department with leakage from her Foley catheter.  From chart review she was discharged on 1/15 after hospitalization for metabolic encephalopathy and nonketotic hyperosmolar state.  She developed some urinary retention and a Foley catheter was placed with plan for outpatient urology follow-up.  Patient tells me that the catheter became displaced and there was some leaking.  No blood.  She reports some mild discomfort from the catheter being in place but no severe pain.  No back or abdominal discomfort.  No vomiting.  No fevers.  I spoke with the patient's guardian Mr. Spell by phone who confirms no other symptoms.  He states there was some urine leaking from around the catheter earlier in the evening which prompted the ED visit. No other concerns.    Past Medical History:  Diagnosis Date   Asthma    Diabetes mellitus without complication (HCC)    GERD (gastroesophageal reflux disease)    History of pulmonary embolism    HLD (hyperlipidemia)    Schizophrenia (HCC)     Review of Systems  Constitutional: No fever/chills Cardiovascular: Denies chest pain. Respiratory: Denies shortness of breath. Gastrointestinal: No abdominal pain.  Genitourinary: Leaking from around the foley.  Skin: Negative for rash. Neurological: Negative for headaches.  ____________________________________________   PHYSICAL EXAM:  VITAL SIGNS: ED Triage Vitals  Encounter Vitals Group     BP 02/25/24 0806 121/85     Pulse Rate 02/25/24 0806 (!) 114     Resp 02/25/24 0806 18     Temp 02/25/24 0806 98.3 F (36.8 C)     Temp Source 02/25/24 0806 Oral     SpO2 02/25/24 0806 100 %     Weight 02/25/24 0808 123 lb 7.3 oz (56 kg)      Height 02/25/24 0808 5' 4 (1.626 m)   Constitutional: Alert. Well appearing and in no acute distress. Eyes: Conjunctivae are normal.  Head: Atraumatic. Nose: No congestion/rhinnorhea. Mouth/Throat: Mucous membranes are moist.  Neck: No stridor.   Cardiovascular: Normal rate, regular rhythm. Good peripheral circulation. Grossly normal heart sounds.   Respiratory: Normal respiratory effort.  No retractions. Lungs CTAB. Gastrointestinal: Soft and nontender. No distention.  No palpable bladder fullness.  With nurse at bedside as chaperone the Foley catheter site was inspected.  There is no leaking urine or bleeding.  No skin irritation.  The Foley catheter was flushed with no resistance or pain reported by the patient.  No leaking from around the Foley catheter site observed.  Musculoskeletal: No gross deformities of extremities. Neurologic:  Normal speech and language.  Skin:  Skin is warm, dry and intact. No rash noted.  ____________________________________________   LABS (all labs ordered are listed, but only abnormal results are displayed)  Labs Reviewed  BASIC METABOLIC PANEL WITH GFR - Abnormal; Notable for the following components:      Result Value   Sodium 132 (*)    CO2 21 (*)    Glucose, Bld 323 (*)    All other components within normal limits  URINALYSIS, W/ REFLEX TO CULTURE (INFECTION SUSPECTED) - Abnormal; Notable for the following components:   APPearance HAZY (*)    Glucose, UA >=500 (*)    Hgb urine dipstick SMALL (*)  Leukocytes,Ua MODERATE (*)    Bacteria, UA RARE (*)    All other components within normal limits  URINE CULTURE  CBC WITH DIFFERENTIAL/PLATELET   ____________________________________________   PROCEDURES  Procedure(s) performed:   Procedures  None  ____________________________________________   INITIAL IMPRESSION / ASSESSMENT AND PLAN / ED COURSE  Pertinent labs & imaging results that were available during my care of the patient were  reviewed by me and considered in my medical decision making (see chart for details).   This patient is Presenting for Evaluation of foley catheter leaking, which does require a range of treatment options, and is a complaint that involves a moderate risk of morbidity and mortality.  The Differential Diagnoses include foley dislodged, UTI, AKI, sepsis, etc.  Critical Interventions-    Medications  sodium chloride  0.9 % bolus 500 mL (0 mLs Intravenous Stopped 02/25/24 1008)    Reassessment after intervention:  HR improved.    I did obtain Additional Historical Information from guardian by phone.  I decided to review pertinent External Data, and in summary d/c on 02/21/24.   Clinical Laboratory Tests Ordered, included UA with moderate leukocytes and rare bacteria.  Nitrite negative.  No clear urinary symptoms.  No leukocytosis on CBC.  Plan to send urine for culture.  No acute kidney injury.  Creatinine 0.85.  Medical Decision Making: Summary:  Patient presents to the emergency department with issues with leaking from the Foley catheter.  The catheter appears to be in place and well-seated.  No reported pain.  It flushes easily without leaking.  Mild tachycardia on arrival with normal blood pressure.  No fever.  Plan for screening blood work in this setting including CBC, chemistry, UA and will send culture.   Reevaluation with update and discussion with patient.  Foley catheter in place and draining well without leakage in the bed here.  Flushed without leakage.  Plan to send urine for culture.  Stable for discharge.  Advise close urology follow-up for catheter management as an outpatient. Provided contact information at discharge.   Patient's presentation is most consistent with acute, uncomplicated illness.   Disposition: discharge  ____________________________________________  FINAL CLINICAL IMPRESSION(S) / ED DIAGNOSES  Final diagnoses:  Problem with Foley catheter, initial  encounter    Note:  This document was prepared using Dragon voice recognition software and may include unintentional dictation errors.  Fonda Law, MD, [facility] Emergency Medicine    Kiren Mcisaac, Fonda MATSU, MD 02/25/24 1047  "

## 2024-02-25 NOTE — Discharge Instructions (Signed)
 Your catheter is functioning well here without any leaking.  We have not found any evidence of a urinary tract infection.  We are sending this for culture and if it grows bacteria that require antibiotics we will call you.  Please keep your appointment with urology to have them manage and hopefully remove the catheter when it is time.

## 2024-02-25 NOTE — ED Triage Notes (Signed)
 PT BIB [facility] from home d/t catheter displacement  c/o leaking and pain at site A/ox4 but can't answer questions    BP 110/78 HR 116 16 rr CBG 372 96% ra Hx diabetes

## 2024-02-25 NOTE — ED Notes (Addendum)
 Pt states we need to call grandfather so he can answer questions, states he is her legal guardian.

## 2024-02-27 LAB — URINE CULTURE: Culture: >=100000 — AB

## 2024-02-28 ENCOUNTER — Telehealth (HOSPITAL_BASED_OUTPATIENT_CLINIC_OR_DEPARTMENT_OTHER): Payer: Self-pay

## 2024-02-28 LAB — SUSCEPTIBILITY, GRAM POS RODS

## 2024-02-28 NOTE — Telephone Encounter (Signed)
 Post ED Visit - Positive Culture Follow-up  Culture report reviewed by antimicrobial stewardship pharmacist: [facility] [x]  Elma Fail, Pharm.D. []  Venetia Gully, Pharm.D., BCPS AQ-ID []  Garrel Crews, Pharm.D., BCPS []  Almarie Lunger, [address].D., BCPS []  Sunshine, [address].D., BCPS, AAHIVP []  Rosaline Bihari, Pharm.D., BCPS, AAHIVP []  Vernell Meier, PharmD, BCPS []  Latanya Hint, PharmD, BCPS []  Donald Medley, PharmD, BCPS []  Rocky Bold, PharmD []  Dorothyann Alert, PharmD, BCPS []  [name] Babe, PharmD  [facility] []  Rosaline Edison, PharmD []  Romona Bliss, PharmD []  Dolphus Roller, PharmD []  Veva Seip, Rph []  Vernell Daunt) Leonce, PharmD []  Eva Allis, PharmD []  Rosaline Millet, PharmD []  Iantha Batch, PharmD []  Arvin Gauss, PharmD []  Wanda Hasting, PharmD []  Ronal Rav, PharmD []  Rocky Slade, PharmD []  Bard Jeans, PharmD   Positive urine culture Reviewed by ED provider: Rolan Quale, DO  CC: catheter displacement with leaking at site. No urinary s/s, afebrile, no leukocytosis, no foley exchange in place draining well. No treatment needed and no further patient follow-up is required at this time.  Debbie Dorsey 02/28/2024, 9:43 AM

## 2024-02-29 LAB — BACTERIAL ORGANISM REFLEX

## 2024-02-29 LAB — ORGANISM ID, BACTERIA

## 2024-03-04 ENCOUNTER — Ambulatory Visit: Payer: Self-pay

## 2024-03-04 LAB — SUSCEPTIBILITY, AER + ANAEROB

## 2024-03-04 LAB — SUSCEPTIBILITY RESULT

## 2024-03-05 LAB — CULTURE, BLOOD (ROUTINE X 2): Special Requests: ADEQUATE

## 2024-03-06 ENCOUNTER — Telehealth (HOSPITAL_BASED_OUTPATIENT_CLINIC_OR_DEPARTMENT_OTHER): Payer: Self-pay

## 2024-03-12 ENCOUNTER — Other Ambulatory Visit (HOSPITAL_COMMUNITY)
Admission: RE | Admit: 2024-03-12 | Discharge: 2024-03-12 | Disposition: A | Source: Ambulatory Visit | Attending: Obstetrics and Gynecology | Admitting: Obstetrics and Gynecology

## 2024-03-12 ENCOUNTER — Ambulatory Visit

## 2024-03-12 VITALS — BP 120/76 | HR 123 | Wt 122.0 lb

## 2024-03-12 DIAGNOSIS — N898 Other specified noninflammatory disorders of vagina: Secondary | ICD-10-CM

## 2024-03-12 NOTE — Progress Notes (Signed)
..  SUBJECTIVE:  54 y.o. female complains of vaginal discharge and odor for a few months. Denies abnormal vaginal bleeding or significant pelvic pain or fever. No UTI symptoms. Denies history of known exposure to STD.  No LMP recorded (lmp unknown).  OBJECTIVE:  She appears well, afebrile. Urine dipstick: not done.  ASSESSMENT:  Vaginal Discharge  Vaginal Odor   PLAN:  GC, chlamydia, trichomonas, BVAG, CVAG probe sent to lab. Treatment: To be determined once lab results are received ROV prn if symptoms persist or worsen.

## 2024-03-13 ENCOUNTER — Ambulatory Visit: Payer: Self-pay | Admitting: Obstetrics and Gynecology

## 2024-03-13 LAB — CERVICOVAGINAL ANCILLARY ONLY
Bacterial Vaginitis (gardnerella): NEGATIVE
Candida Glabrata: NEGATIVE
Candida Vaginitis: NEGATIVE
Chlamydia: NEGATIVE
Comment: NEGATIVE
Comment: NEGATIVE
Comment: NEGATIVE
Comment: NEGATIVE
Comment: NEGATIVE
Comment: NORMAL
Neisseria Gonorrhea: NEGATIVE
Trichomonas: NEGATIVE

## 2024-04-07 ENCOUNTER — Ambulatory Visit: Payer: Self-pay | Admitting: Family Medicine
